# Patient Record
Sex: Male | Born: 1940
Health system: Southern US, Community
[De-identification: ages and names within clinical notes are randomized; demographics above are authoritative.]

## PROBLEM LIST (undated history)

## (undated) DIAGNOSIS — F329 Major depressive disorder, single episode, unspecified: Secondary | ICD-10-CM

## (undated) DIAGNOSIS — E785 Hyperlipidemia, unspecified: Secondary | ICD-10-CM

## (undated) DIAGNOSIS — N189 Chronic kidney disease, unspecified: Secondary | ICD-10-CM

## (undated) DIAGNOSIS — N400 Enlarged prostate without lower urinary tract symptoms: Secondary | ICD-10-CM

## (undated) DIAGNOSIS — C439 Malignant melanoma of skin, unspecified: Secondary | ICD-10-CM

## (undated) DIAGNOSIS — F419 Anxiety disorder, unspecified: Secondary | ICD-10-CM

## (undated) DIAGNOSIS — G20 Parkinson's disease: Secondary | ICD-10-CM

## (undated) DIAGNOSIS — M1990 Unspecified osteoarthritis, unspecified site: Secondary | ICD-10-CM

## (undated) DIAGNOSIS — K5904 Chronic idiopathic constipation: Secondary | ICD-10-CM

## (undated) DIAGNOSIS — I10 Essential (primary) hypertension: Secondary | ICD-10-CM

## (undated) DIAGNOSIS — G20C Parkinsonism, unspecified: Secondary | ICD-10-CM

## (undated) DIAGNOSIS — K8020 Calculus of gallbladder without cholecystitis without obstruction: Secondary | ICD-10-CM

## (undated) DIAGNOSIS — K5790 Diverticulosis of intestine, part unspecified, without perforation or abscess without bleeding: Secondary | ICD-10-CM

## (undated) DIAGNOSIS — Q613 Polycystic kidney, unspecified: Secondary | ICD-10-CM

## (undated) DIAGNOSIS — Z8489 Family history of other specified conditions: Secondary | ICD-10-CM

## (undated) DIAGNOSIS — Z9889 Other specified postprocedural states: Secondary | ICD-10-CM

## (undated) DIAGNOSIS — F32A Depression, unspecified: Secondary | ICD-10-CM

## (undated) DIAGNOSIS — T7840XA Allergy, unspecified, initial encounter: Secondary | ICD-10-CM

## (undated) DIAGNOSIS — K219 Gastro-esophageal reflux disease without esophagitis: Secondary | ICD-10-CM

## (undated) DIAGNOSIS — R112 Nausea with vomiting, unspecified: Secondary | ICD-10-CM

## (undated) DIAGNOSIS — Z9289 Personal history of other medical treatment: Secondary | ICD-10-CM

## (undated) HISTORY — PX: KNEE SURGERY: SHX244

## (undated) HISTORY — PX: HERNIA REPAIR: SHX51

## (undated) HISTORY — DX: Chronic idiopathic constipation: K59.04

## (undated) HISTORY — DX: Gastro-esophageal reflux disease without esophagitis: K21.9

## (undated) HISTORY — DX: Unspecified osteoarthritis, unspecified site: M19.90

## (undated) HISTORY — DX: Hyperlipidemia, unspecified: E78.5

## (undated) HISTORY — DX: Parkinson's disease: G20

## (undated) HISTORY — DX: Depression, unspecified: F32.A

## (undated) HISTORY — DX: Essential (primary) hypertension: I10

## (undated) HISTORY — DX: Calculus of gallbladder without cholecystitis without obstruction: K80.20

## (undated) HISTORY — PX: TONSILLECTOMY AND ADENOIDECTOMY: SUR1326

## (undated) HISTORY — DX: Malignant melanoma of skin, unspecified: C43.9

## (undated) HISTORY — DX: Diverticulosis of intestine, part unspecified, without perforation or abscess without bleeding: K57.90

## (undated) HISTORY — DX: Personal history of other medical treatment: Z92.89

## (undated) HISTORY — DX: Chronic kidney disease, unspecified: N18.9

## (undated) HISTORY — DX: Benign prostatic hyperplasia without lower urinary tract symptoms: N40.0

## (undated) HISTORY — DX: Polycystic kidney, unspecified: Q61.3

## (undated) HISTORY — DX: Major depressive disorder, single episode, unspecified: F32.9

## (undated) HISTORY — DX: Parkinsonism, unspecified: G20.C

## (undated) HISTORY — DX: Allergy, unspecified, initial encounter: T78.40XA

## (undated) HISTORY — DX: Anxiety disorder, unspecified: F41.9

---

## 1989-07-19 DIAGNOSIS — Z8719 Personal history of other diseases of the digestive system: Secondary | ICD-10-CM | POA: Insufficient documentation

## 1997-12-20 ENCOUNTER — Ambulatory Visit (HOSPITAL_COMMUNITY): Admission: RE | Admit: 1997-12-20 | Discharge: 1997-12-20 | Payer: Self-pay | Admitting: Internal Medicine

## 1997-12-20 ENCOUNTER — Encounter: Payer: Self-pay | Admitting: Internal Medicine

## 1997-12-21 ENCOUNTER — Ambulatory Visit (HOSPITAL_COMMUNITY): Admission: RE | Admit: 1997-12-21 | Discharge: 1997-12-21 | Payer: Self-pay | Admitting: Internal Medicine

## 1997-12-21 ENCOUNTER — Encounter: Payer: Self-pay | Admitting: Internal Medicine

## 2000-03-04 ENCOUNTER — Encounter: Payer: Self-pay | Admitting: Internal Medicine

## 2000-09-22 ENCOUNTER — Encounter: Payer: Self-pay | Admitting: Internal Medicine

## 2000-09-22 ENCOUNTER — Encounter: Payer: Self-pay | Admitting: Emergency Medicine

## 2000-09-22 ENCOUNTER — Emergency Department (HOSPITAL_COMMUNITY): Admission: EM | Admit: 2000-09-22 | Discharge: 2000-09-23 | Payer: Self-pay | Admitting: Emergency Medicine

## 2000-09-23 ENCOUNTER — Encounter: Payer: Self-pay | Admitting: Emergency Medicine

## 2000-09-23 ENCOUNTER — Encounter: Payer: Self-pay | Admitting: Internal Medicine

## 2000-09-26 ENCOUNTER — Ambulatory Visit (HOSPITAL_COMMUNITY): Admission: RE | Admit: 2000-09-26 | Discharge: 2000-09-26 | Payer: Self-pay | Admitting: Internal Medicine

## 2000-09-26 ENCOUNTER — Encounter: Payer: Self-pay | Admitting: Internal Medicine

## 2000-10-04 ENCOUNTER — Encounter: Payer: Self-pay | Admitting: Internal Medicine

## 2000-10-04 ENCOUNTER — Ambulatory Visit (HOSPITAL_COMMUNITY): Admission: RE | Admit: 2000-10-04 | Discharge: 2000-10-04 | Payer: Self-pay | Admitting: Internal Medicine

## 2001-07-15 ENCOUNTER — Encounter: Payer: Self-pay | Admitting: Gastroenterology

## 2001-07-15 DIAGNOSIS — K5730 Diverticulosis of large intestine without perforation or abscess without bleeding: Secondary | ICD-10-CM | POA: Insufficient documentation

## 2002-11-13 ENCOUNTER — Ambulatory Visit (HOSPITAL_COMMUNITY): Admission: RE | Admit: 2002-11-13 | Discharge: 2002-11-13 | Payer: Self-pay | Admitting: Nephrology

## 2002-11-13 ENCOUNTER — Encounter: Payer: Self-pay | Admitting: Nephrology

## 2003-08-25 ENCOUNTER — Encounter: Admission: RE | Admit: 2003-08-25 | Discharge: 2003-08-25 | Payer: Self-pay | Admitting: Internal Medicine

## 2004-01-06 ENCOUNTER — Ambulatory Visit: Payer: Self-pay | Admitting: Internal Medicine

## 2004-08-02 ENCOUNTER — Ambulatory Visit: Payer: Self-pay | Admitting: Internal Medicine

## 2004-09-11 ENCOUNTER — Ambulatory Visit: Payer: Self-pay | Admitting: Internal Medicine

## 2004-10-26 ENCOUNTER — Encounter: Admission: RE | Admit: 2004-10-26 | Discharge: 2004-10-26 | Payer: Self-pay | Admitting: Nephrology

## 2004-11-30 ENCOUNTER — Ambulatory Visit: Payer: Self-pay | Admitting: Internal Medicine

## 2004-12-08 ENCOUNTER — Ambulatory Visit: Payer: Self-pay | Admitting: Internal Medicine

## 2004-12-21 ENCOUNTER — Emergency Department (HOSPITAL_COMMUNITY): Admission: EM | Admit: 2004-12-21 | Discharge: 2004-12-21 | Payer: Self-pay | Admitting: Family Medicine

## 2005-01-12 ENCOUNTER — Ambulatory Visit (HOSPITAL_COMMUNITY): Admission: RE | Admit: 2005-01-12 | Discharge: 2005-01-12 | Payer: Self-pay | Admitting: General Surgery

## 2005-12-03 ENCOUNTER — Ambulatory Visit: Payer: Self-pay | Admitting: Internal Medicine

## 2006-01-18 ENCOUNTER — Ambulatory Visit: Payer: Self-pay | Admitting: Internal Medicine

## 2006-01-18 LAB — CONVERTED CEMR LAB
AST: 25 units/L (ref 0–37)
Albumin: 3.4 g/dL — ABNORMAL LOW (ref 3.5–5.2)
Amylase: 94 units/L (ref 27–131)
Basophils Relative: 0.4 % (ref 0.0–1.0)
Bilirubin, Direct: 0.1 mg/dL (ref 0.0–0.3)
Eosinophil percent: 2.8 % (ref 0.0–5.0)
HCT: 39.1 % (ref 39.0–52.0)
Hemoglobin: 13.7 g/dL (ref 13.0–17.0)
Lipase: 32 units/L (ref 11.0–59.0)
MCHC: 35.1 g/dL (ref 30.0–36.0)
Monocytes Absolute: 0.7 10*3/uL (ref 0.2–0.7)
Neutrophils Relative %: 49 % (ref 43.0–77.0)
RBC: 4.25 M/uL (ref 4.22–5.81)
RDW: 12.1 % (ref 11.5–14.6)
Total Bilirubin: 1.1 mg/dL (ref 0.3–1.2)
Total Protein: 6.8 g/dL (ref 6.0–8.3)
WBC: 5.6 10*3/uL (ref 4.5–10.5)

## 2006-01-30 ENCOUNTER — Ambulatory Visit: Payer: Self-pay | Admitting: Internal Medicine

## 2006-03-06 ENCOUNTER — Ambulatory Visit: Payer: Self-pay | Admitting: Internal Medicine

## 2006-05-30 DIAGNOSIS — E785 Hyperlipidemia, unspecified: Secondary | ICD-10-CM | POA: Insufficient documentation

## 2006-05-30 DIAGNOSIS — A048 Other specified bacterial intestinal infections: Secondary | ICD-10-CM | POA: Insufficient documentation

## 2006-05-30 DIAGNOSIS — Z9089 Acquired absence of other organs: Secondary | ICD-10-CM | POA: Insufficient documentation

## 2006-05-30 DIAGNOSIS — K2980 Duodenitis without bleeding: Secondary | ICD-10-CM | POA: Insufficient documentation

## 2006-05-30 DIAGNOSIS — Q613 Polycystic kidney, unspecified: Secondary | ICD-10-CM | POA: Insufficient documentation

## 2006-05-30 DIAGNOSIS — N400 Enlarged prostate without lower urinary tract symptoms: Secondary | ICD-10-CM | POA: Insufficient documentation

## 2006-05-30 DIAGNOSIS — Z9889 Other specified postprocedural states: Secondary | ICD-10-CM | POA: Insufficient documentation

## 2006-05-30 DIAGNOSIS — N009 Acute nephritic syndrome with unspecified morphologic changes: Secondary | ICD-10-CM | POA: Insufficient documentation

## 2006-05-30 DIAGNOSIS — D539 Nutritional anemia, unspecified: Secondary | ICD-10-CM | POA: Insufficient documentation

## 2006-06-03 ENCOUNTER — Ambulatory Visit: Payer: Self-pay | Admitting: Internal Medicine

## 2006-06-27 ENCOUNTER — Ambulatory Visit: Payer: Self-pay | Admitting: Gastroenterology

## 2006-06-27 LAB — CONVERTED CEMR LAB
ALT: 17 U/L
AST: 22 U/L
Albumin: 3.7 g/dL
Alkaline Phosphatase: 71 U/L
BUN: 21 mg/dL
Basophils Absolute: 0.2 K/uL — ABNORMAL HIGH
Basophils Relative: 4 % — ABNORMAL HIGH
Bilirubin, Direct: 0.1 mg/dL
CO2: 29 meq/L
Calcium: 9.1 mg/dL
Chloride: 107 meq/L
Creatinine, Ser: 1.1 mg/dL
Eosinophils Absolute: 0.2 K/uL
Eosinophils Relative: 2.9 %
GFR calc Af Amer: 86 mL/min
GFR calc non Af Amer: 71 mL/min
Glucose, Bld: 91 mg/dL
HCT: 39.2 %
Hemoglobin: 13.6 g/dL
Lymphocytes Relative: 32.5 %
MCHC: 34.6 g/dL
MCV: 91 fL
Monocytes Absolute: 0.5 K/uL
Monocytes Relative: 8.9 %
Neutro Abs: 3.1 K/uL
Neutrophils Relative %: 51.7 %
Platelets: 193 K/uL
Potassium: 4 meq/L
RBC: 4.31 M/uL
RDW: 13.2 %
Sodium: 141 meq/L
TSH: 2.37 u[IU]/mL
Total Bilirubin: 0.9 mg/dL
Total Protein: 7 g/dL
WBC: 5.9 10*3/microliter

## 2006-07-01 ENCOUNTER — Ambulatory Visit (HOSPITAL_COMMUNITY): Admission: RE | Admit: 2006-07-01 | Discharge: 2006-07-01 | Payer: Self-pay | Admitting: Gastroenterology

## 2006-07-01 DIAGNOSIS — Z87442 Personal history of urinary calculi: Secondary | ICD-10-CM | POA: Insufficient documentation

## 2006-07-19 ENCOUNTER — Ambulatory Visit: Payer: Self-pay | Admitting: Gastroenterology

## 2006-07-19 ENCOUNTER — Encounter: Payer: Self-pay | Admitting: Internal Medicine

## 2006-08-05 ENCOUNTER — Ambulatory Visit: Payer: Self-pay | Admitting: Internal Medicine

## 2006-08-05 DIAGNOSIS — K219 Gastro-esophageal reflux disease without esophagitis: Secondary | ICD-10-CM | POA: Insufficient documentation

## 2006-08-05 DIAGNOSIS — R42 Dizziness and giddiness: Secondary | ICD-10-CM | POA: Insufficient documentation

## 2006-08-20 ENCOUNTER — Telehealth (INDEPENDENT_AMBULATORY_CARE_PROVIDER_SITE_OTHER): Payer: Self-pay | Admitting: *Deleted

## 2006-08-26 ENCOUNTER — Telehealth: Payer: Self-pay | Admitting: Internal Medicine

## 2006-09-02 ENCOUNTER — Ambulatory Visit: Payer: Self-pay | Admitting: Gastroenterology

## 2006-10-29 ENCOUNTER — Encounter: Payer: Self-pay | Admitting: Internal Medicine

## 2006-12-02 ENCOUNTER — Telehealth (INDEPENDENT_AMBULATORY_CARE_PROVIDER_SITE_OTHER): Payer: Self-pay | Admitting: *Deleted

## 2007-01-10 ENCOUNTER — Telehealth (INDEPENDENT_AMBULATORY_CARE_PROVIDER_SITE_OTHER): Payer: Self-pay | Admitting: *Deleted

## 2007-04-26 DIAGNOSIS — D179 Benign lipomatous neoplasm, unspecified: Secondary | ICD-10-CM | POA: Insufficient documentation

## 2007-04-26 DIAGNOSIS — J309 Allergic rhinitis, unspecified: Secondary | ICD-10-CM | POA: Insufficient documentation

## 2007-04-26 DIAGNOSIS — I10 Essential (primary) hypertension: Secondary | ICD-10-CM | POA: Insufficient documentation

## 2007-04-26 DIAGNOSIS — M1990 Unspecified osteoarthritis, unspecified site: Secondary | ICD-10-CM | POA: Insufficient documentation

## 2007-06-11 ENCOUNTER — Encounter: Payer: Self-pay | Admitting: Internal Medicine

## 2007-08-11 ENCOUNTER — Ambulatory Visit: Payer: Self-pay | Admitting: Internal Medicine

## 2007-08-11 ENCOUNTER — Encounter (INDEPENDENT_AMBULATORY_CARE_PROVIDER_SITE_OTHER): Payer: Self-pay | Admitting: *Deleted

## 2007-08-11 DIAGNOSIS — K921 Melena: Secondary | ICD-10-CM | POA: Insufficient documentation

## 2007-08-11 DIAGNOSIS — N401 Enlarged prostate with lower urinary tract symptoms: Secondary | ICD-10-CM

## 2007-08-11 DIAGNOSIS — N138 Other obstructive and reflux uropathy: Secondary | ICD-10-CM | POA: Insufficient documentation

## 2007-08-11 DIAGNOSIS — R351 Nocturia: Secondary | ICD-10-CM | POA: Insufficient documentation

## 2007-08-13 ENCOUNTER — Encounter: Payer: Self-pay | Admitting: Internal Medicine

## 2007-08-15 ENCOUNTER — Telehealth (INDEPENDENT_AMBULATORY_CARE_PROVIDER_SITE_OTHER): Payer: Self-pay | Admitting: *Deleted

## 2007-08-18 ENCOUNTER — Encounter (INDEPENDENT_AMBULATORY_CARE_PROVIDER_SITE_OTHER): Payer: Self-pay | Admitting: *Deleted

## 2007-08-18 LAB — CONVERTED CEMR LAB: PSA: 1.13 ng/mL (ref 0.10–4.00)

## 2007-09-02 ENCOUNTER — Encounter: Payer: Self-pay | Admitting: Internal Medicine

## 2007-09-03 ENCOUNTER — Telehealth (INDEPENDENT_AMBULATORY_CARE_PROVIDER_SITE_OTHER): Payer: Self-pay | Admitting: *Deleted

## 2007-09-10 ENCOUNTER — Ambulatory Visit: Payer: Self-pay | Admitting: Gastroenterology

## 2007-09-23 ENCOUNTER — Encounter: Payer: Self-pay | Admitting: Internal Medicine

## 2007-10-06 ENCOUNTER — Ambulatory Visit: Payer: Self-pay | Admitting: Gastroenterology

## 2007-10-22 ENCOUNTER — Ambulatory Visit: Payer: Self-pay | Admitting: Gastroenterology

## 2007-10-22 LAB — CONVERTED CEMR LAB
OCCULT 1: NEGATIVE
OCCULT 3: NEGATIVE
OCCULT 4: NEGATIVE
OCCULT 5: NEGATIVE

## 2007-10-24 ENCOUNTER — Encounter: Payer: Self-pay | Admitting: Gastroenterology

## 2007-10-28 ENCOUNTER — Encounter: Payer: Self-pay | Admitting: Internal Medicine

## 2007-12-08 ENCOUNTER — Encounter: Payer: Self-pay | Admitting: Gastroenterology

## 2008-01-14 ENCOUNTER — Telehealth (INDEPENDENT_AMBULATORY_CARE_PROVIDER_SITE_OTHER): Payer: Self-pay | Admitting: *Deleted

## 2008-01-28 ENCOUNTER — Encounter: Payer: Self-pay | Admitting: Internal Medicine

## 2008-02-10 ENCOUNTER — Ambulatory Visit: Payer: Self-pay | Admitting: Internal Medicine

## 2008-02-10 DIAGNOSIS — R29898 Other symptoms and signs involving the musculoskeletal system: Secondary | ICD-10-CM | POA: Insufficient documentation

## 2008-02-10 DIAGNOSIS — D239 Other benign neoplasm of skin, unspecified: Secondary | ICD-10-CM | POA: Insufficient documentation

## 2008-02-10 DIAGNOSIS — K056 Periodontal disease, unspecified: Secondary | ICD-10-CM | POA: Insufficient documentation

## 2008-02-10 DIAGNOSIS — D492 Neoplasm of unspecified behavior of bone, soft tissue, and skin: Secondary | ICD-10-CM | POA: Insufficient documentation

## 2008-02-10 DIAGNOSIS — K069 Disorder of gingiva and edentulous alveolar ridge, unspecified: Secondary | ICD-10-CM

## 2008-02-17 ENCOUNTER — Encounter: Payer: Self-pay | Admitting: Internal Medicine

## 2008-03-05 ENCOUNTER — Encounter: Payer: Self-pay | Admitting: Internal Medicine

## 2008-03-12 ENCOUNTER — Encounter: Payer: Self-pay | Admitting: Internal Medicine

## 2008-04-05 ENCOUNTER — Encounter: Payer: Self-pay | Admitting: Internal Medicine

## 2008-04-12 ENCOUNTER — Telehealth (INDEPENDENT_AMBULATORY_CARE_PROVIDER_SITE_OTHER): Payer: Self-pay | Admitting: *Deleted

## 2008-06-17 ENCOUNTER — Encounter: Payer: Self-pay | Admitting: Internal Medicine

## 2008-07-07 ENCOUNTER — Telehealth: Payer: Self-pay | Admitting: Gastroenterology

## 2008-07-19 ENCOUNTER — Telehealth (INDEPENDENT_AMBULATORY_CARE_PROVIDER_SITE_OTHER): Payer: Self-pay | Admitting: *Deleted

## 2008-08-06 ENCOUNTER — Ambulatory Visit: Payer: Self-pay | Admitting: Internal Medicine

## 2008-08-06 ENCOUNTER — Telehealth (INDEPENDENT_AMBULATORY_CARE_PROVIDER_SITE_OTHER): Payer: Self-pay | Admitting: *Deleted

## 2008-08-06 DIAGNOSIS — R609 Edema, unspecified: Secondary | ICD-10-CM | POA: Insufficient documentation

## 2008-08-08 LAB — CONVERTED CEMR LAB
Basophils Absolute: 0 10*3/uL (ref 0.0–0.1)
Basophils Relative: 1 % (ref 0–1)
Eosinophils Absolute: 0.5 10*3/uL (ref 0.0–0.7)
MCHC: 32.2 g/dL (ref 30.0–36.0)
MCV: 93.1 fL (ref 78.0–100.0)
Neutro Abs: 2.8 10*3/uL (ref 1.7–7.7)
Neutrophils Relative %: 44 % (ref 43–77)
Platelets: 199 10*3/uL (ref 150–400)

## 2008-08-09 ENCOUNTER — Encounter: Payer: Self-pay | Admitting: Internal Medicine

## 2008-08-10 ENCOUNTER — Encounter (INDEPENDENT_AMBULATORY_CARE_PROVIDER_SITE_OTHER): Payer: Self-pay | Admitting: *Deleted

## 2008-08-13 ENCOUNTER — Encounter: Payer: Self-pay | Admitting: Internal Medicine

## 2008-08-13 ENCOUNTER — Telehealth (INDEPENDENT_AMBULATORY_CARE_PROVIDER_SITE_OTHER): Payer: Self-pay | Admitting: *Deleted

## 2008-08-26 ENCOUNTER — Telehealth (INDEPENDENT_AMBULATORY_CARE_PROVIDER_SITE_OTHER): Payer: Self-pay | Admitting: *Deleted

## 2008-10-01 ENCOUNTER — Encounter: Payer: Self-pay | Admitting: Internal Medicine

## 2009-01-22 HISTORY — PX: COLONOSCOPY: SHX174

## 2009-02-15 ENCOUNTER — Telehealth: Payer: Self-pay | Admitting: Gastroenterology

## 2009-03-21 ENCOUNTER — Telehealth (INDEPENDENT_AMBULATORY_CARE_PROVIDER_SITE_OTHER): Payer: Self-pay | Admitting: *Deleted

## 2009-06-21 ENCOUNTER — Encounter: Payer: Self-pay | Admitting: Internal Medicine

## 2009-07-04 ENCOUNTER — Encounter: Payer: Self-pay | Admitting: Internal Medicine

## 2009-07-08 ENCOUNTER — Ambulatory Visit (HOSPITAL_COMMUNITY): Admission: RE | Admit: 2009-07-08 | Discharge: 2009-07-08 | Payer: Self-pay | Admitting: Nephrology

## 2009-10-04 ENCOUNTER — Telehealth: Payer: Self-pay | Admitting: Gastroenterology

## 2009-12-05 ENCOUNTER — Ambulatory Visit: Payer: Self-pay | Admitting: Internal Medicine

## 2009-12-05 DIAGNOSIS — M79609 Pain in unspecified limb: Secondary | ICD-10-CM | POA: Insufficient documentation

## 2009-12-05 DIAGNOSIS — R361 Hematospermia: Secondary | ICD-10-CM | POA: Insufficient documentation

## 2009-12-12 ENCOUNTER — Encounter: Payer: Self-pay | Admitting: Internal Medicine

## 2009-12-14 ENCOUNTER — Encounter: Payer: Self-pay | Admitting: Internal Medicine

## 2009-12-14 ENCOUNTER — Ambulatory Visit: Payer: Self-pay | Admitting: Internal Medicine

## 2009-12-17 ENCOUNTER — Encounter: Payer: Self-pay | Admitting: Internal Medicine

## 2009-12-26 ENCOUNTER — Emergency Department (HOSPITAL_COMMUNITY)
Admission: EM | Admit: 2009-12-26 | Discharge: 2009-12-27 | Payer: Self-pay | Source: Home / Self Care | Admitting: Emergency Medicine

## 2009-12-26 ENCOUNTER — Encounter: Payer: Self-pay | Admitting: Internal Medicine

## 2009-12-28 ENCOUNTER — Ambulatory Visit: Payer: Self-pay | Admitting: Internal Medicine

## 2009-12-28 DIAGNOSIS — R079 Chest pain, unspecified: Secondary | ICD-10-CM | POA: Insufficient documentation

## 2009-12-28 DIAGNOSIS — R9431 Abnormal electrocardiogram [ECG] [EKG]: Secondary | ICD-10-CM | POA: Insufficient documentation

## 2009-12-29 ENCOUNTER — Ambulatory Visit: Payer: Self-pay | Admitting: Internal Medicine

## 2010-01-02 LAB — CONVERTED CEMR LAB
ALT: 17 units/L (ref 0–53)
BUN: 24 mg/dL — ABNORMAL HIGH (ref 6–23)
Basophils Absolute: 0 10*3/uL (ref 0.0–0.1)
Calcium: 9 mg/dL (ref 8.4–10.5)
Cholesterol: 207 mg/dL — ABNORMAL HIGH (ref 0–200)
Creatinine, Ser: 1.5 mg/dL (ref 0.4–1.5)
Eosinophils Absolute: 0.4 10*3/uL (ref 0.0–0.7)
GFR calc non Af Amer: 48.54 mL/min — ABNORMAL LOW (ref >60.00)
HDL: 66.1 mg/dL (ref >39.00)
Hemoglobin: 13.4 g/dL (ref 13.0–17.0)
Lymphocytes Relative: 33.2 % (ref 12.0–46.0)
MCHC: 33.9 g/dL (ref 30.0–36.0)
Neutro Abs: 2.2 10*3/uL (ref 1.4–7.7)
RDW: 13.3 % (ref 11.5–14.6)
Total Bilirubin: 1 mg/dL (ref 0.3–1.2)
Total CHOL/HDL Ratio: 3
Triglycerides: 51 mg/dL (ref 0.0–149.0)
VLDL: 10.2 mg/dL (ref 0.0–40.0)

## 2010-01-03 ENCOUNTER — Telehealth: Payer: Self-pay | Admitting: Gastroenterology

## 2010-01-19 ENCOUNTER — Ambulatory Visit: Payer: Self-pay | Admitting: Internal Medicine

## 2010-01-19 ENCOUNTER — Encounter: Payer: Self-pay | Admitting: Internal Medicine

## 2010-02-19 LAB — CONVERTED CEMR LAB
ALT: 21 units/L (ref 0–40)
AST: 26 units/L (ref 0–37)
Albumin: 3.9 g/dL (ref 3.5–5.2)
Basophils Absolute: 0 10*3/uL (ref 0.0–0.1)
Chloride: 106 meq/L (ref 96–112)
Creatinine, Ser: 1.3 mg/dL (ref 0.4–1.5)
Glomerular Filtration Rate, Af Am: 71 mL/min/{1.73_m2}
Glucose, Bld: 105 mg/dL — ABNORMAL HIGH (ref 70–99)
Iron: 117 ug/dL (ref 42–165)
Lymphocytes Relative: 36.6 % (ref 12.0–46.0)
MCHC: 34 g/dL (ref 30.0–36.0)
MCV: 91.2 fL (ref 78.0–100.0)
Neutro Abs: 2.2 10*3/uL (ref 1.4–7.7)
Neutrophils Relative %: 46.1 % (ref 43.0–77.0)
Phosphorus Concentration: 3.7 mg/dL (ref 2.3–4.6)
Platelets: 205 10*3/uL (ref 150–400)
RBC: 4.55 M/uL (ref 4.22–5.81)
Sodium: 141 meq/L (ref 135–145)
Total Bilirubin: 1 mg/dL (ref 0.3–1.2)

## 2010-02-21 NOTE — Progress Notes (Signed)
Summary: Protonix refill  Medications Added PROTONIX 40 MG  TBEC (PANTOPRAZOLE SODIUM) 1 by mouth qd       Phone Note Refill Request Message from:  Fax from Pharmacy on October 04, 2009 10:53 AM  Refills Requested: Medication #1:  PROTONIX 40 MG  TBEC 1 by mouth qd   Dosage confirmed as above?Dosage Confirmed   Brand Name Necessary? No   Supply Requested: 6 months  Method Requested: Electronic Initial call taken by: Merri Ray CMA Duncan Dull),  October 04, 2009 10:53 AM    New/Updated Medications: PROTONIX 40 MG  TBEC (PANTOPRAZOLE SODIUM) 1 by mouth qd Prescriptions: PROTONIX 40 MG  TBEC (PANTOPRAZOLE SODIUM) 1 by mouth qd  #30 x 3   Entered by:   Merri Ray CMA (AAMA)   Authorized by:   Louis Meckel MD   Signed by:   Merri Ray CMA (AAMA) on 10/04/2009   Method used:   Electronically to        [facility]* (retail)       [address]       [place], [place]  [address]       Ph: [phone]       Fax: [phone]   RxID:   [id number]   Appended Document: Protonix refill Pt  needs a follow office appointment, to recieve any further refills

## 2010-02-21 NOTE — Progress Notes (Signed)
Summary: Protonix refill   Phone Note Refill Request Message from:  Fax from Pharmacy on February 15, 2009 8:32 AM  Refills Requested: Medication #1:  PROTONIX 40 MG  TBEC 1 by mouth qd   Dosage confirmed as above?Dosage Confirmed   Brand Name Necessary? No   Supply Requested: 6 months  Method Requested: Electronic Initial call taken by: Merri Ray CMA Duncan Dull),  February 15, 2009 8:32 AM    Prescriptions: PROTONIX 40 MG  TBEC (PANTOPRAZOLE SODIUM) 1 by mouth qd  #30 x 6   Entered by:   Merri Ray CMA (AAMA)   Authorized by:   Louis Meckel MD   Signed by:   Merri Ray CMA (AAMA) on 02/15/2009   Method used:   Electronically to        [facility]* (retail)       [address]       [place], [place]  [id number]       Ph: [phone]       Fax: [phone]   RxID:   [id number]

## 2010-02-21 NOTE — Letter (Signed)
Summary: [facility]  [facility]   Imported By: Lanelle Bal 06/30/2009 13:25:12  _____________________________________________________________________  External Attachment:    Type:   Image     Comment:   External Document

## 2010-02-21 NOTE — Letter (Signed)
Summary: [facility]  [facility]   Imported By: Lennie Odor 07/19/2009 14:12:37  _____________________________________________________________________  External Attachment:    Type:   Image     Comment:   External Document

## 2010-02-21 NOTE — Assessment & Plan Note (Signed)
Summary: HOSPTIAL FOLLOWUP/OK PER KELLY//KN   Vital Signs:  Patient profile:   70 year old male Weight:      176.6 pounds BMI:     24.55 Temp:     97.4 degrees F oral Pulse rate:   64 / minute Resp:     15 per minute BP sitting:   114 / 68  (left arm) Cuff size:   large  Vitals Entered By: Shonna Chock CMA (December 28, 2009 2:54 PM) CC: Hospital Follow-up: concerns not resolved   Primary Care Provider:  Marga Melnick, M.D.  CC:  Hospital Follow-up: concerns not resolved.  History of Present Illness:      This is a 70 year old male who was seen in [facility] ER 12/5 with Chest pain.  The patient described resting chest pain, w/o  nausea, vomiting, diaphoresis, shortness of breath, palpitations, light headedness, or  indigestion.  The pain was  constant  and sharp for 45 min.  The pain is located in the left anterior chest and the pain does not radiate.  The pain  had no triggers or relievers.EKG was similar to that @ [facility]. No recurrence. His brother had CBAG @ 71.  Current Medications (verified): 1)  Astelin 137 Mcg/spray Soln (Azelastine Hcl) .... 2spray Each Nostril Two Times A Day 2)  Fexofenadine Hcl 180 Mg Tabs (Fexofenadine Hcl) .[name].. 1 By Mouth Qd 3)  Protonix 40 Mg  Tbec (Pantoprazole Sodium) .[name].. 1 By Mouth Qd 4)  Bayer Low Strength 81 Mg Tbec (Aspirin) .[name].. 1 By Mouth Once Daily 5)  Amlodipine Besylate 5 Mg Tabs (Amlodipine Besylate) .[name].. 1 By Mouth At Bedtime 6)  Hydrochlorothiazide 12.5 Mg Caps (Hydrochlorothiazide) .[name].. 1 By Mouth Once Daily 7)  Allergy Injections .... 2 Injections Once Weekly 8)  Nasonex 50 Mcg/act Susp (Mometasone Furoate) .[name].. 1-2 Sprays Daily As Needed 9)  Azelastine Hcl 0.05 % Soln (Azelastine Hcl) .... As Needed For Allergies 10)  Singulair 10 Mg Tabs (Montelukast Sodium) .... As Needed 11)  Meclizine Hcl 12.5 Mg Tabs (Meclizine Hcl) .[name].. 12.5mg -25mg  As Needed For Vertigo 12)  Gabapentin 100 Mg Caps (Gabapentin) .[name].. 1 Every 8 Hrs As Needed For Thigh  Pain 13)  Micardis 40 Mg Tabs (Telmisartan) .[name].. 1 By Mouth Once Daily  Allergies: 1)  ! Adhesive Tape 2)  Lodine 3)  Valium 4)  * Iodine and Mecurichrome  Physical Exam  General:  well-nourished,in no acute distress; alert,appropriate and cooperative throughout examination Lungs:  Normal respiratory effort, chest expands symmetrically. Lungs are clear to auscultation, no crackles or wheezes. Heart:  Normal rate and regular rhythm. S1 and S2 normal without gallop, murmur, click, rub.S4 with slurring  Pulses:  R and L carotid,radial,dorsalis pedis and posterior tibial pulses are full and equal bilaterally   Impression & Recommendations:  Problem # 1:  CHEST PAIN (ICD-786.50) negative cardiac evaluation [place] 12/26/2009 Orders: Cardiology Referral (Cardiology)  Problem # 2:  NONSPECIFIC ABNORMAL ELECTROCARDIOGRAM (ICD-794.31)  loss of R voltage lateral V leads w/o ischemic T changes  Orders: Cardiology Referral (Cardiology)  Complete Medication List: 1)  Astelin 137 Mcg/spray Soln (Azelastine hcl) .... 2spray each nostril two times a day 2)  Fexofenadine Hcl 180 Mg Tabs (Fexofenadine hcl) .[name].. 1 by mouth qd 3)  Protonix 40 Mg Tbec (Pantoprazole sodium) .[name].. 1 by mouth qd 4)  Bayer Low Strength 81 Mg Tbec (Aspirin) .[name].. 1 by mouth once daily 5)  Amlodipine Besylate 5 Mg Tabs (Amlodipine besylate) .[name].. 1 by  mouth at bedtime 6)  Hydrochlorothiazide 12.5 Mg Caps (Hydrochlorothiazide) .[name].. 1 by mouth once daily 7)  Allergy Injections  .... 2 injections once weekly 8)  Nasonex 50 Mcg/act Susp (Mometasone furoate) .[name].. 1-2 sprays daily as needed 9)  Azelastine Hcl 0.05 % Soln (Azelastine hcl) .... As needed for allergies 10)  Singulair 10 Mg Tabs (Montelukast sodium) .... As needed 11)  Meclizine Hcl 12.5 Mg Tabs (Meclizine hcl) .[name].. 12.5mg -25mg  as needed for vertigo 12)  Gabapentin 100 Mg Caps (Gabapentin) .[name].. 1 every 8 hrs as needed for thigh pain 13)  Micardis 40 Mg Tabs  (Telmisartan) .[name].. 1 by mouth once daily  Patient Instructions: 1)   Hold exercise until seen by Cardiology.Please schedule fasting labs: 2)  BMP ; 3)  Hepatic Panel ; 4)  Lipid Panel ; 5)  TSH ; 6)  CBC w/ Diff.   Orders Added: 1)  Est. Patient Level III [14782] 2)  Cardiology Referral [Cardiology]

## 2010-02-21 NOTE — Progress Notes (Signed)
Summary: RX  Phone Note Refill Request   Refills Requested: Medication #1:  ASTELIN 137 MCG/SPRAY SOLN 1 spray each nostril two times a day [place] --[phone]  Initial call taken by: Freddy Jaksch,  March 21, 2009 9:50 AM    Prescriptions: ASTELIN 137 MCG/SPRAY SOLN (AZELASTINE HCL) 1 spray each nostril two times a day  #1 x 3   Entered by:   Kandice Hams   Authorized by:   Marga Melnick MD   Signed by:   Kandice Hams on 03/21/2009   Method used:   Faxed to ...       [facility]* (retail)       [address]       [place], [place]  [address]       Ph: [phone]       Fax: [phone]   RxID:   [id number]

## 2010-02-21 NOTE — Assessment & Plan Note (Signed)
Summary: YEARLY EXAM///SPH   Vital Signs:  Patient profile:   70 year old male Height:      71.25 inches Weight:      176.2 pounds BMI:     24.49 Temp:     98.0 degrees F oral Pulse rate:   72 / minute Resp:     14 per minute BP sitting:   102 / 68  (left arm) Cuff size:   large  Vitals Entered By: Shonna Chock CMA (December 14, 2009 2:35 PM) CC: [facility]    Primary Care Provider:  Marga Melnick, M.D.  CC:  [facility] .  History of Present Illness: Here for Medicare AWV: 1.Risk factors based on Past M, S, F history:GERD;HTN;Allergic Rhinitis ( chart updated) 2.Physical Activities: active w/o definite  exercise program 3.Depression/mood: no issues 4.Hearing:hhe sees Dr Roe Rutherford , Audiologist 5.ADL's: no limitations 6.Fall Risk: none 7.Home Safety: no issues 8.Height, weight, &visual acuity: wall chart read @ 6 ft with contacts 9.Counseling: POA& Living Will in place 10.Labs ordered based on risk factors: see Orders 11. Referral Coordination: none requested 12.  Care Plan: see Instructions 13. Cognitive Assessment: Oriented X 3; memory & recall   intact ;  "WORLD " spelled backwards; mood & affect normal. Hypertension Follow-Up:  The patient reports fatigue, but denies lightheadedness, urinary frequency, headaches, and edema.  The patient denies the following associated symptoms: chest pain, chest pressure, exercise intolerance, dyspnea, palpitations, and syncope.  Compliance with medications (by patient report) has been near 100%.  The patient reports that dietary compliance has been good.  Adjunctive measures currently used by the patient include salt restriction.   BP averages 105/78.See EKG: new loss of lateral V lead QRS voltage of ? significance.  Preventive Screening-Counseling & Management  Alcohol-Tobacco     Alcohol drinks/day: <1     Smoking Status: never  Caffeine-Diet-Exercise     Caffeine use/day: < 1  Hep-HIV-STD-Contraception     Dental Visit-last 6 months no     Dental Care Counseling: seen annually     Sun Exposure-Excessive: no  Safety-Violence-Falls     Seat Belt Use: yes     Firearms in the Home: firearms in the home     Firearm Counseling: not indicated; uses recommended firearm safety measures     Smoke Detectors: yes      Blood Transfusions:  no.        Travel History:  [place] 1996.    Allergies: 1)  ! Adhesive Tape 2)  Lodine 3)  Valium 4)  * Iodine and Mecurichrome  Past History:  Past Medical History: Benign prostatic hypertrophy Anemia-NOS Hyperlipidemia: NMR Lipoprofile 2007: LDL 119(882/77), HDL 65, TG 82. LDL goal < 160 Polycystic kidneys, Dr Geanie Berlin Diverticulosis, colon Bullous Pemphigoid, Dr Karlyn Agee  Past Surgical History: Hernia , umbilical 2006 Tonsillectomy Colonoscopy 2004 (?) neg; 2009 diverticulosis  Family History: Family History of Breast Cancer:sister and daughter No FH of Colon Cancer: Father: DUD Mother: arthritis Siblings: bro: DM, TICs , CABG; sister: AF ,ulcer; sister: breast cancer;MGM : CVA; P uncl : MI in 47s  Social History: Patient has never smoked.  Alcohol Use - yes: minimally Retired but active in Agricultural consultant work Married Caffeine use/day:  < 1 Dental Care w/in 6 mos.:  no Sun Exposure-Excessive:  no Risk analyst Use:  yes Blood Transfusions:  no  Review of Systems  The patient denies anorexia, fever, weight loss, weight gain, hoarseness, prolonged cough, hemoptysis, abdominal pain, melena, hematochezia, severe indigestion/heartburn, hematuria, suspicious skin  lesions, unusual weight change, abnormal bleeding, enlarged lymph nodes, and angioedema.    Physical Exam  General:  Appears younger than age,well-nourished;alert,appropriate and cooperative throughout examination Head:  Normocephalic and atraumatic without obvious abnormalities. No apparent alopecia  Eyes:  No corneal or conjunctival inflammation noted.Perrla. Funduscopic exam benign, without hemorrhages, exudates or  papilledema.  Ears:  External ear exam shows no significant lesions or deformities.  Otoscopic examination reveals clear canals, tympanic membranes are intact bilaterally without bulging, retraction, inflammation or discharge. Hearing is grossly normal bilaterally. Nose:  External nasal examination shows no deformity or inflammation. Nasal mucosa are pink and moist without lesions or exudates. Septum to R Mouth:  Oral mucosa and oropharynx without lesions or exudates.  Teeth in good repair. Neck:  No deformities, masses, or tenderness noted. Chest Wall:   Minor pectus carinatum.   Lungs:  Normal respiratory effort, chest expands symmetrically. Lungs are clear to auscultation, no crackles or wheezes. Heart:  Normal rate and regular rhythm. S1 and S2 normal without gallop, murmur, click, rub . Occassional extra extra  beat . S4 Abdomen:  Bowel sounds positive,abdomen soft and non-tender without masses, organomegaly or hernias noted. Msk:  No deformity or scoliosis noted of thoracic or lumbar spine.   Pulses:  R and L carotid,radial,dorsalis pedis and posterior tibial pulses are full and equal bilaterally Extremities:  No clubbing, cyanosis, edema. Minor OA  DIP changes Neurologic:  alert & oriented X3 and DTRs symmetrical and normal.   Skin:  Intact without suspicious lesions or rashes Cervical Nodes:  No lymphadenopathy noted Axillary Nodes:  No palpable lymphadenopathy Psych:  memory intact for recent and remote, normally interactive, and good eye contact.     Impression & Recommendations:  Problem # 1:  PREVENTIVE HEALTH CARE (ICD-V70.0)  Orders: [facility] -Subsequent Annual Wellness Visit [phone])  Problem # 2:  HYPERTENSION (ICD-401.9)  Goal as per Dr Geanie Berlin = < 120 systolic His updated medication list for this problem includes:    Amlodipine Besylate 5 Mg Tabs (Amlodipine besylate) .[name]... 1 by mouth once daily    Hydrochlorothiazide 12.5 Mg Caps (Hydrochlorothiazide) .[name]... 1 by mouth  once daily  Orders: EKG w/ Interpretation (93000): new loss of QRS voltage in lateral V leads; Cardiology overread will be requested  Problem # 3:  HYPERPLASIA PROSTATE UNS W/UR OBST & OTH LUTS (ICD-600.91) as per Dr Marcello Fennel  Problem # 4:  POLYCYSTIC KIDNEY DISEASE (ICD-753.12) as per Dr  Geanie Berlin  Complete Medication List: 1)  Astelin 137 Mcg/spray Soln (Azelastine hcl) .... 2spray each nostril two times a day 2)  Fexofenadine Hcl 180 Mg Tabs (Fexofenadine hcl) .[name].. 1 by mouth qd 3)  Protonix 40 Mg Tbec (Pantoprazole sodium) .[name].. 1 by mouth qd 4)  Bayer Low Strength 81 Mg Tbec (Aspirin) .[name].. 1 by mouth once daily 5)  Amlodipine Besylate 5 Mg Tabs (Amlodipine besylate) .[name].. 1 by mouth once daily 6)  Hydrochlorothiazide 12.5 Mg Caps (Hydrochlorothiazide) .[name].. 1 by mouth once daily 7)  Allergy Injections  .... 2 injections once weekly 8)  Nasonex 50 Mcg/act Susp (Mometasone furoate) .[name].. 1-2 sprays daily 9)  Azelastine Hcl 0.05 % Soln (Azelastine hcl) .... As needed for allergies 10)  Singulair 10 Mg Tabs (Montelukast sodium) .... As needed 11)  Meclizine Hcl 12.5 Mg Tabs (Meclizine hcl) .[name].. 12.5mg -25mg  as needed for vertigo 12)  Gabapentin 100 Mg Caps (Gabapentin) .[name].. 1 every 8 hrs as needed for thigh pain  Other Orders: Zoster (Shingles) Vaccine Live [phone]) Admin 1st Vaccine (  45409)  Patient Instructions: 1)  It is important that you exercise regularly at least 20 minutes 5 times a week. If you develop chest pain, have severe difficulty breathing, or feel very tired , stop exercising immediately and seek medical attention. Consider stretch aerobics or freestyle swimming for back. Please ask Dr Geanie Berlin to forward labs in 01/2010. 2)  Check your Blood Pressure regularly. If it is above: 120 systolic ON AVERAGE  you should make an appointment. I'll request a Cardiologist overread the EKG to assess the voltage changes in lateral V leads.   Orders Added: 1)  Zoster (Shingles)  Vaccine Live [90736] 2)  Admin 1st Vaccine [90471] 3)  [facility] -Subsequent Annual Wellness Visit [G0439] 4)  Est. Patient Level III [81191] 5)  EKG w/ Interpretation [93000]   Immunizations Administered:  Zostavax # 1:    Vaccine Type: Zostavax    Site: Right Arm    Mfr: Merck    Dose: 0.26mL    Route: [place]    Given by: Shonna Chock CMA    Exp. Date: 11/23/2010    Lot #: [id number]   Immunizations Administered:  Zostavax # 1:    Vaccine Type: Zostavax    Site: Right Arm    Mfr: Merck    Dose: 0.49mL    Route: [place]    Given by: Shonna Chock CMA    Exp. Date: 11/23/2010    Lot #: [id number]

## 2010-02-21 NOTE — Assessment & Plan Note (Signed)
Summary: PAIN IN THE RIGHT THIGH x3-4 WEEKS/RH......   Vital Signs:  Patient profile:   70 year old male Weight:      176.4 pounds BMI:     24.01 Temp:     98.4 degrees F oral Pulse rate:   72 / minute Resp:     14 per minute BP sitting:   112 / 70  (left arm) Cuff size:   large  Vitals Entered By: Shonna Chock CMA (December 05, 2009 2:02 PM)  Primary Care Provider:  Marga Melnick, M.D.  CC:  Lower Extremity Joint pain.  History of Present Illness: Lower Extremity Pain      This is a 70 year old man who presents with Lower Extremity  pain. for > 1 year.Initially this was just above the R knee . Now it radiates up to R groin.  The patient denies swelling, redness, giving away, locking, popping, stiffness for >1 hr, decreased ROM, and weakness of the RLE.   The pain began gradually and with no injury.  The pain is described as burning, intermittent, and activity related, only when standing or a period of time.  To date  no evaluation.  The patient denies the following symptoms: fever, rash, photosensitivity, eye symptoms, diarrhea, and dysuria.  He has had hematospermia X 2 in past 2 weeks. He has PMH of chronic LBP symptoms.  Current Medications (verified): 1)  Astelin 137 Mcg/spray Soln (Azelastine Hcl) .... 2spray Each Nostril Two Times A Day 2)  Fexofenadine Hcl 180 Mg Tabs (Fexofenadine Hcl) .[name].. 1 By Mouth Qd 3)  Protonix 40 Mg  Tbec (Pantoprazole Sodium) .[name].. 1 By Mouth Qd 4)  Bayer Low Strength 81 Mg Tbec (Aspirin) .[name].. 1 By Mouth Once Daily 5)  Amlodipine Besylate 5 Mg Tabs (Amlodipine Besylate) .[name].. 1 By Mouth Once Daily 6)  Hydrochlorothiazide 12.5 Mg Caps (Hydrochlorothiazide) .[name].. 1 By Mouth Once Daily 7)  Allergy Injections .... 2 Injections Once Weekly 8)  Nasonex 50 Mcg/act Susp (Mometasone Furoate) .[name].. 1-2 Sprays Daily 9)  Azelastine Hcl 0.05 % Soln (Azelastine Hcl) .... As Needed For Allergies 10)  Singulair 10 Mg Tabs (Montelukast Sodium) .... As Needed 11)   Meclizine Hcl 12.5 Mg Tabs (Meclizine Hcl) .[name].. 12.5mg -25mg  As Needed For Vertigo  Allergies: 1)  ! Adhesive Tape 2)  Lodine 3)  Valium 4)  * Iodine and Mecurichrome  Physical Exam  General:  Appears youmger than age,well-nourished,in no acute distress; alert,appropriate and cooperative throughout examination Abdomen:  Bowel sounds positive,abdomen soft and non-tender . RLQ mass (? R kidney) & ventral  hernia  noted. Genitalia:  Testes bilaterally descended without nodularity, tenderness or masses. No scrotal masses or lesions. No penis lesions or urethral discharge. L varicocele.   Prostate:  Prostate gland firm and smooth, no enlargement, nodularity, tenderness, mass, asymmetry or induration. Msk:  No deformity or scoliosis noted of thoracic or lumbar spine.  No tenderness to percussion LS spine Pulses:  R and L radial,dorsalis pedis and posterior tibial pulses are full and equal bilaterally Extremities:  No clubbing, cyanosis, edema. OA DIP deformities noted with normal full range of motion of all joints.  Neg SLR Neurologic:  alert & oriented X3, strength normal in all extremities, gait(heel/toe) normal, and DTRs symmetrical and normal.   Skin:  Intact without suspicious lesions or rashes Cervical Nodes:  No lymphadenopathy noted Axillary Nodes:  No palpable lymphadenopathy Psych:  memory intact for recent and remote, normally interactive, and good eye contact.  Impression & Recommendations:  Problem # 1:  THIGH PAIN (ICD-729.5)  ? L 4 radiculopathy from spinal stenosis  Orders: T-Lumbar Spine Complete, 5 Views [phone]) Prescription Created Electronically [phone])  Problem # 2:  HEMATOSPERMIA (NWG-956.21)  Orders: Urology Referral (Urology)  Complete Medication List: 1)  Astelin 137 Mcg/spray Soln (Azelastine hcl) .... 2spray each nostril two times a day 2)  Fexofenadine Hcl 180 Mg Tabs (Fexofenadine hcl) .[name].. 1 by mouth qd 3)  Protonix 40 Mg Tbec (Pantoprazole  sodium) .[name].. 1 by mouth qd 4)  Bayer Low Strength 81 Mg Tbec (Aspirin) .[name].. 1 by mouth once daily 5)  Amlodipine Besylate 5 Mg Tabs (Amlodipine besylate) .[name].. 1 by mouth once daily 6)  Hydrochlorothiazide 12.5 Mg Caps (Hydrochlorothiazide) .[name].. 1 by mouth once daily 7)  Allergy Injections  .... 2 injections once weekly 8)  Nasonex 50 Mcg/act Susp (Mometasone furoate) .[name].. 1-2 sprays daily 9)  Azelastine Hcl 0.05 % Soln (Azelastine hcl) .... As needed for allergies 10)  Singulair 10 Mg Tabs (Montelukast sodium) .... As needed 11)  Meclizine Hcl 12.5 Mg Tabs (Meclizine hcl) .[name].. 12.5mg -25mg  as needed for vertigo 12)  Gabapentin 100 Mg Caps (Gabapentin) .[name].. 1 every 8 hrs as needed for thigh pain  Other Orders: Flu Vaccine 46yrs + MEDICARE PATIENTS (H0865) Administration Flu vaccine - MCR (H8469)  Patient Instructions: 1)  Please see Dr Marcello Fennel concerning hematospermia. Prescriptions: GABAPENTIN 100 MG CAPS (GABAPENTIN) 1 every 8 hrs as needed for thigh pain  #30 x 5   Entered and Authorized by:   Marga Melnick MD   Signed by:   Marga Melnick MD on 12/05/2009   Method used:   Faxed to ...       [facility]* (retail)       [address]       [place], [place]  [address]       Ph: [phone]       Fax: [phone]   RxID:   [id number]    Orders Added: 1)  Flu Vaccine 30yrs + MEDICARE PATIENTS [Q2039] 2)  Administration Flu vaccine - MCR [G0008] 3)  Est. Patient Level IV [18841] 4)  T-Lumbar Spine Complete, 5 Views [71110TC] 5)  Urology Referral [Urology] 6)  Prescription Created Electronically [phone] Flu Vaccine Consent Questions     Do you have a history of severe allergic reactions to this vaccine? no    Any prior history of allergic reactions to egg and/or gelatin? no    Do you have a sensitivity to the preservative Thimersol? no    Do you have a past history of Guillan-Barre Syndrome? no    Do you currently have an acute febrile illness? no    Have  you ever had a severe reaction to latex? no    Vaccine information given and explained to patient? yes    Are you currently pregnant? no    Lot Number:[id number]   Exp Date:07/22/2010   Site Given  Left Deltoid IM+ MEDICARE PATIENTS [Q2039] 2)  Administration Flu vaccine - MCR [G0008]      .lbmedflu

## 2010-02-23 NOTE — Assessment & Plan Note (Signed)
Summary: np6/chest pains/abnormal ekg      Allergies Added:   Primary Jermane Brayboy:  Marga Melnick, M.D.   History of Present Illness:   This is a 70 year old male who was seen in [facility] ER 12/5 with Chest pain.  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  he came in to take a shower.  Sat down.  developed sharp L sided CP.  Not pleuritc.  No positional.  NoSOB or N.  No reflux.   The pain did not go away and 911 was called.  He wnet to [facility] ER and was seen, sent home. He has not had any chest pain since.   Current Medications (verified): 1)  Astelin 137 Mcg/spray Soln (Azelastine Hcl) .... 2spray Each Nostril Two Times A Day 2)  Fexofenadine Hcl 180 Mg Tabs (Fexofenadine Hcl) .[name].. 1 By Mouth Qd 3)  Protonix 40 Mg  Tbec (Pantoprazole Sodium) .[name].. 1 By Mouth Qd 4)  Bayer Low Strength 81 Mg Tbec (Aspirin) .[name].. 1 By Mouth Once Daily 5)  Amlodipine Besylate 5 Mg Tabs (Amlodipine Besylate) .[name].. 1 By Mouth At Bedtime 6)  Hydrochlorothiazide 12.5 Mg Caps (Hydrochlorothiazide) .[name].. 1 By Mouth Once Daily 7)  Allergy Injections .... 2 Injections Once Weekly 8)  Nasonex 50 Mcg/act Susp (Mometasone Furoate) .[name].. 1-2 Sprays Daily As Needed 9)  Azelastine Hcl 0.05 % Soln (Azelastine Hcl) .... As Needed For Allergies 10)  Singulair 10 Mg Tabs (Montelukast Sodium) .... As Needed 11)  Meclizine Hcl 12.5 Mg Tabs (Meclizine Hcl) .[name].. 12.5mg -25mg  As Needed For Vertigo 12)  Gabapentin 100 Mg Caps (Gabapentin) .[name].. 1 Every 8 Hrs As Needed For Thigh Pain 13)  Micardis 40 Mg Tabs (Telmisartan) .[name].. 1 By Mouth Once Daily  Allergies (verified): 1)  ! Adhesive Tape 2)  Lodine 3)  Valium 4)  * Iodine and Mecurichrome  Past History:  Family History: Last updated: 12/14/2009 Family History of Breast Cancer:sister and daughter No FH of Colon Cancer: Father: DUD Mother: arthritis Siblings: bro: DM, TICs , CABG; sister: AF ,ulcer; sister: breast cancer;MGM : CVA; P uncl : MI  in 65s  Social History: Last updated: 12/14/2009 Patient has never smoked.  Alcohol Use - yes: minimally Retired but active in Agricultural consultant work Married  Past medical, surgical, family and social histories (including risk factors) reviewed, and no changes noted (except as noted below).  Past Medical History: Reviewed history from 12/14/2009 and no changes required. Benign prostatic hypertrophy Anemia-NOS Hyperlipidemia: NMR Lipoprofile 2007: LDL 119(882/77), HDL 65, TG 82. LDL goal < 160 Polycystic kidneys, Dr Geanie Berlin Diverticulosis, colon Bullous Pemphigoid, Dr Karlyn Agee  Past Surgical History: Reviewed history from 12/14/2009 and no changes required. Hernia , umbilical 2006 Tonsillectomy Colonoscopy 2004 (?) neg; 2009 diverticulosis  Family History: Reviewed history from 12/14/2009 and no changes required. Family History of Breast Cancer:sister and daughter No FH of Colon Cancer: Father: DUD Mother: arthritis Siblings: bro: DM, TICs , CABG; sister: AF ,ulcer; sister: breast cancer;MGM : CVA; P uncl : MI in 63s  Social History: Reviewed history from 12/14/2009 and no changes required. Patient has never smoked.  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  NEg to the above problem except as noted above.  Vital Signs:  Patient profile:   70 year old male Height:      71.25 inches Weight:  177 pounds BMI:     24.60 Pulse rate:   66 / minute Resp:     16 per minute BP sitting:   112 / 76  (left arm)  Vitals Entered By: Marrion Coy, CNA (January 19, 2010 12:00 PM)  Physical Exam  Additional Exam:  Patient is in NAD HEENT:  Normocephalic, atraumatic. EOMI, PERRLA.  Neck: JVP is normal. No thyromegaly. No bruits.  Lungs: clear to auscultation. No rales no wheezes.  Heart: Regular rate and rhythm. Normal S1, S2. No S3.   No significant murmurs. PMI not displaced.  Abdomen:   Supple, nontender. Normal bowel sounds. No masses. No hepatomegaly.  Extremities:   Good distal pulses throughout. No lower extremity edema.  Musculoskeletal :moving all extremities.  Neuro:   alert and oriented x3.    EKG  Procedure date:  01/19/2010  Findings:      Sinus bradycardia  59 bpm.  First dege AV block.  Impression & Recommendations:  Problem # 1:  CHEST PAIN (ICD-786.50) Atypical.  I donitint think it was cardiac in orign.   He has not had a recurrence.   I would follow .  Would not schedule any tests.  If recurs or develops other chest pains consider work up.  Problem # 2:  HYPERTENSION (ICD-401.9) Good control  Problem # 3:  HYPERLIPIDEMIA (ICD-272.4) Not on meds.  Discussed diet.   Appended Document: np6/chest pains/abnormal ekg EKG:  Sinus bradycardia.  59 bpm.  First degree AV block

## 2010-02-23 NOTE — Letter (Signed)
Summary: [facility]  [facility]   Imported By: Lanelle Bal 01/02/2010 10:53:55  _____________________________________________________________________  External Attachment:    Type:   Image     Comment:   External Document

## 2010-02-23 NOTE — Progress Notes (Signed)
Summary: Protonix refill  Medications Added PROTONIX 40 MG  TBEC (PANTOPRAZOLE SODIUM) 1 by mouth qd       Phone Note Refill Request Message from:  Fax from Pharmacy on January 03, 2010 10:20 AM  Refills Requested: Medication #1:  PROTONIX 40 MG  TBEC 1 by mouth qd   Dosage confirmed as above?Dosage Confirmed   Brand Name Necessary? No   Supply Requested: 6 months  Method Requested: Electronic Initial call taken by: Merri Ray CMA Duncan Dull),  January 03, 2010 10:21 AM    New/Updated Medications: PROTONIX 40 MG  TBEC (PANTOPRAZOLE SODIUM) 1 by mouth qd Prescriptions: PROTONIX 40 MG  TBEC (PANTOPRAZOLE SODIUM) 1 by mouth qd  #30 x 6   Entered by:   Merri Ray CMA (AAMA)   Authorized by:   Louis Meckel MD   Signed by:   Merri Ray CMA (AAMA) on 01/03/2010   Method used:   Electronically to        [facility]* (retail)       [address]       [place], [place]  [id number]       Ph: [phone]       Fax: [phone]   RxID:   [id number]

## 2010-04-03 LAB — BASIC METABOLIC PANEL
CO2: 26 mEq/L (ref 19–32)
Calcium: 8.6 mg/dL (ref 8.4–10.5)
Creatinine, Ser: 1.32 mg/dL (ref 0.4–1.5)
GFR calc Af Amer: >60 mL/min (ref >60)
Glucose, Bld: 90 mg/dL (ref 70–99)

## 2010-04-03 LAB — POCT CARDIAC MARKERS

## 2010-04-03 LAB — CBC
MCH: 30.8 pg (ref 26.0–34.0)
MCHC: 33.7 g/dL (ref 30.0–36.0)
Platelets: 176 10*3/uL (ref 150–400)
RDW: 13 % (ref 11.5–15.5)

## 2010-06-06 NOTE — Assessment & Plan Note (Signed)
[facility]                         GASTROENTEROLOGY OFFICE NOTE   Henry Adams, Henry Adams                        MRN:          [id number]  DATE:09/02/2006                            DOB:          1940-07-04    PROBLEM:  Dyspepsia.   Henry Adams has returned for scheduled GI followup.  Upper endoscopy was  entirely normal.  CT of the abdomen prompted by palpable abdominal  masses demonstrated mass effect on the large and small bowel loops  secondary to his polycystic kidney disease, and a complex left-sided  renal cyst without evidence of enhancing mass.  Henry Adams is feeling  well and currently has no GI complaints.  He remains on Protonix 40 mg a  day.  He states clearly that his symptoms are improved with Protonix  compared with Aciphex.   ON EXAM:  VITAL SIGNS:  Pulse 72.  Blood pressure 100/70.  Weight 173.   IMPRESSION:  Nonspecific dyspepsia.   RECOMMENDATIONS:  Continue Protonix as stated.     Barbette Hair. Arlyce Dice, MD,FACG  Electronically Signed    RDK/MedQ  DD: 09/02/2006  DT: 09/02/2006  Job #: [id number]   cc:   Titus Dubin. Alwyn Ren, MD,FACP,FCCP  Terrial Rhodes, M.D.

## 2010-06-06 NOTE — Assessment & Plan Note (Signed)
[name]                         GASTROENTEROLOGY OFFICE NOTE   Henry Adams, Henry Adams                        MRN:          [id number]  DATE:06/27/2006                            DOB:          Apr 05, 1940    REASON FOR CONSULTATION:  Abdominal discomfort.   REASON:  Henry Adams is a 70 year old white male referred through the  courtesy of Dr. Alwyn Ren for evaluation. For the past month he has been  complaining of early satiety, post prandial fullness, mild nausea, and  generalized indigestion. He is having excess eructation. He denies  pyrosis, per se. There is no history of dysphagia. He has been on  Aciphex for over 10 years but feels like the medicine does not work.  When he has tried this twice a day symptoms have not improved. He also  complains of intermittent nausea. He has lost 8 to 10 pounds over the  past 6 months which he attributes to changes in diet. There is no change  in bowel habits or melena. He underwent screening colonoscopy in 2003  that demonstrated sigmoid diverticula. An esophageal stricture was  dilated in 2000.   PAST MEDICAL HISTORY:  Pertinent for hypertension, he has polycystic  disease, he is status post herniorrhaphy.   FAMILY HISTORY:  Pertinent for brother with heart disease.   MEDICATIONS:  Include;  1. Aciphex 20 mg a day.  2. Micardis.  3. Norvasc.  4. Allegra.  5. Astelin.  6. Baby aspirin.  7. Singulair.   HE IS ALLERGIC TO VALIUM, MERCUROCHROME, AND A BAND-AID ADHESIVE.   He neither smokes, nor drinks. He is married and worked for [employer].   REVIEW OF SYSTEMS:  Positive for urinary changes, and fatigue, and loss  of hearing.   PHYSICAL EXAMINATION:  Pulse 68, blood pressure 100/66, weight 173.  HEENT: EOMI. PERRLA. Sclerae are anicteric.  Conjunctivae are pink.  NECK:  Supple without thyromegaly, adenopathy or carotid bruits.  CHEST:  Clear to auscultation and percussion without adventitious  sounds.  CARDIAC:  Regular rhythm; normal S1 S2.  There are no murmurs, gallops  or rubs.  ABDOMEN:  He has a palpable mass measuring at least 2 x 5 cm, that is  mobile and nontender, located in the left upper quadrant. There is no  organomegaly.  EXTREMITIES:  Full range of motion.  No cyanosis, clubbing or edema.  RECTAL:  Deferred.   IMPRESSION:  Dyspepsia. This could be due to ulcer, or a nonulcer  dyspepsia, or related to gastroesophageal reflux disease. It is note  worthy that he has an abdominal mass which could represent a neoplasm.  Alternatively, I may be palpating a large renal cyst.   RECOMMENDATION:  1. CT of the abdomen and pelvis.  2. Trial of Protonix or Prevacid in leu of Aciphex.  3. Upper endoscopy.     Barbette Hair. Arlyce Dice, MD,FACG  Electronically Signed    RDK/MedQ  DD: 06/27/2006  DT: 06/27/2006  Job #: [id number]   cc:   Titus Dubin. Alwyn Ren, MD,FACP,FCCP  Terrial Rhodes, M.D.

## 2010-06-06 NOTE — Assessment & Plan Note (Signed)
[facility]                        [place] OFFICE NOTE   ABIE, CHEEK                        MRN:          [id number]  DATE:06/03/2006                            DOB:          Jan 21, 1941    Marion was seen in follow up for his reflux after 6 months.  He was seen  on March 06, 2006 and was on AcipHex twice a day.  At that time, he  was planning a trip to [place].  He had no sequelae or complications  while in [place].  He has dropped the AcipHex to once a day and feels he  has no symptoms at this time.  He presently describes reflux as a 1 or a  2 on a 10 scale as far as symptomatology.  He will have dyspepsia  symptoms approximately one time a week.  Triggers are chocolate or  excess amounts of food.   He has been on AcipHex since 2001.  Prior to that, he had been on  Pepcid, Tagamet and Omeprazole, none of which controlled his symptoms.   Significantly, in 1991, he had esophageal dilatation for a stricture  that presented as chest pain and dysphagia.   At this time, he denies dysphagia, melena or other GI symptoms, except  for minor flatulence.   His weight is down 2.5 pounds to 177, which he states is related to  diet.  The oropharynx reveals no erythema.  The abdomen is unremarkable  without organomegaly, masses or tenderness.  He has no lymphadenopathy.   I will renew the AcipHex, but I would encourage him to be seen by  [facility], in view of the significant past history 17  years ago and the fact that he has been on AcipHex for 7 years.  This  would be in compliance with the guidelines of the American  Gastroenterologic Association.   A GI consult will be pursued, and further work-up referred to their  expertise.     Titus Dubin. Alwyn Ren, MD,FACP,FCCP  Electronically Signed    WF[facility]/MedQ  DD: 06/03/2006  DT: 06/03/2006  Job #: [id number]

## 2010-06-09 NOTE — Op Note (Signed)
NAME:  Henry Adams, ROWE NO.:  [id number]   MEDICAL RECORD NO.:  [id number]          PATIENT TYPE:  EMS   LOCATION:  URG                          FACILITY:  [facility]   PHYSICIAN:  Ollen Gross. Vernell Morgans, M.D. DATE OF BIRTH:  1940-03-19   DATE OF PROCEDURE:  01/16/2005  DATE OF DISCHARGE:                                 OPERATIVE REPORT   PREOPERATIVE DIAGNOSIS:  Umbilical hernia.   POSTOPERATIVE DIAGNOSIS:  Umbilical hernia.   PROCEDURE:  Umbilical hernia repair.   SURGEON:  Dr. Carolynne Edouard   ANESTHESIA:  General.   PROCEDURE:  After informed consent was obtained, the patient was brought to  the operating room and placed in a supine position on the operating room  table.  After adequate induction of general anesthesia, the patient's  abdomen was prepped with Betadine and draped in usual sterile manner.  The  area above the umbilicus was then infiltrated with 0.25% Marcaine.  A small  transverse incision was made just above the umbilicus.  This incision was  carried down through the skin and subcutaneous tissue sharply with the  electrocautery.  Blunt dissection of the subcutaneous tissue was then  carried out until the hernia sac was identified.  It was separated from the  rest of the subcutaneous tissue by a combination of blunt hemostat  dissection and sharp dissection with the electrocautery.  The hernia sac was  then opened, and only some preperitoneal fat was identified.  The edges of  the defect were then cleaned of any adhesions by sharp dissection with the  electrocautery.  The hernia defect itself was very small, less than a  centimeter.  The defect was then closed with interrupted #1 Novofil  stitches, and the repair appeared to be intact without any tension.  The  subcutaneous tissue was then closed with interrupted 3-0 Vicryl stitches,  and the skin was closed with running 4-0 Monocryl subcuticular stitch.  Benzoin, Steri-Strips, and sterile dressings were applied.   The patient  tolerated the procedure well.  At the end of the case, all needle, sponge,  and instrument counts were correct.  The patient was then awakened and taken  to recovery in stable condition.      Ollen Gross. Vernell Morgans, M.D.  Electronically Signed     PST/MEDQ  D:  01/16/2005  T:  01/16/2005  Job:  [id number]

## 2010-09-12 ENCOUNTER — Encounter: Payer: Self-pay | Admitting: Internal Medicine

## 2010-09-12 ENCOUNTER — Ambulatory Visit (INDEPENDENT_AMBULATORY_CARE_PROVIDER_SITE_OTHER): Payer: Medicare Other | Admitting: Internal Medicine

## 2010-09-12 VITALS — BP 112/70 | HR 119 | Temp 98.1°F | Wt 179.2 lb

## 2010-09-12 DIAGNOSIS — L129 Pemphigoid, unspecified: Secondary | ICD-10-CM

## 2010-09-12 DIAGNOSIS — W57XXXA Bitten or stung by nonvenomous insect and other nonvenomous arthropods, initial encounter: Secondary | ICD-10-CM

## 2010-09-12 DIAGNOSIS — L120 Bullous pemphigoid: Secondary | ICD-10-CM

## 2010-09-12 DIAGNOSIS — T148 Other injury of unspecified body region: Secondary | ICD-10-CM

## 2010-09-12 MED ORDER — DOXYCYCLINE HYCLATE 100 MG PO TABS: 100.0000 mg | ORAL_TABLET | Freq: Two times a day (BID) | ORAL | Status: AC

## 2010-09-12 NOTE — Progress Notes (Signed)
  Subjective:    Patient ID: Henry Adams, male    DOB: 03/21/1940, 70 y.o.   MRN: [id number]  HPI 8/16 after  he walked through field; he found > 100 immature ticks on clothing. On 8/17 & 8/18 he found 12  Imbedded ticks which he removed by scrapping. This same period time he noted diffuse bullae  of  lower extremities, not necessarily at the site of the embedded ticks.  He denies headache, fever, chills, or sweats.  Significantly he has a past medical history bullous pemphigoid for which he saw Dr. Gaye Pollack, dermatologist..  He has been applying triple antibiotic ointment to the tick bite sites.    Review of Systems edema began after the events above.     Objective:   Physical Exam he is in no acute distress;he  has no increase work of breathing  He is most scleral icterus or conjunctivitis  Chest is clear without rhonchi or wheezes he  Has a slow S4. Pulse rate was 70 on recheck.  He is no organomegaly or masses of the abdomen  He has no lymphadenopathy in the head, neck, axilla, or inguinal areas.  He has scattered bullous lesions over the lower extremities. There are scattered excoriated areas which he describes as site of tick bites.  He has some dependent ankle edema          Assessment & Plan:  #1 tick bites without active cellulitis  #2 bullous pemphigoid, exacerbation  Plan: Acute RMSF titer will be collected and he  be placed on doxycycline. He is to  avoid direct sun while on the doxycycline. He should stop the Triple Antibiotic ointment as this has high risk of causing contact dermatitis.

## 2010-09-12 NOTE — Patient Instructions (Signed)
Dip gauze in  sterile saline and applied to the wound twice a day. Cover the wound with Telfa , non stick dressing  without any antibiotic ointment. The saline can be purchased at the drugstore or you can make your own .Boil cup of salt in a gallon of water. Store mixture  in a clean container.Report Warning  signs as discussed (red streaks, pus, fever, increasing pain). STOP antibiotic ointment

## 2010-09-13 LAB — ROCKY MTN SPOTTED FVR AB, IGG-BLOOD: RMSF IgG: 0.12 IV

## 2011-04-04 ENCOUNTER — Other Ambulatory Visit: Payer: Self-pay | Admitting: Nephrology

## 2011-04-04 DIAGNOSIS — Q613 Polycystic kidney, unspecified: Secondary | ICD-10-CM

## 2011-04-07 ENCOUNTER — Other Ambulatory Visit: Payer: Medicare Other

## 2011-04-11 ENCOUNTER — Ambulatory Visit
Admission: RE | Admit: 2011-04-11 | Discharge: 2011-04-11 | Disposition: A | Discharge status: Home / Self Care | Payer: Medicare Other | Source: Ambulatory Visit | Attending: Nephrology | Admitting: Nephrology

## 2011-04-11 DIAGNOSIS — Q613 Polycystic kidney, unspecified: Secondary | ICD-10-CM

## 2011-05-30 ENCOUNTER — Telehealth: Payer: Self-pay | Admitting: Internal Medicine

## 2011-05-30 NOTE — Telephone Encounter (Signed)
Left message on voice mail with Dr.Hopper's instruction  

## 2011-05-30 NOTE — Telephone Encounter (Signed)
Caller: Antjuan/Patient; PCP: Marga Melnick; CB#: [phone]; Call regarding Concerns for Vertigo and Wanting Something Called in; Reports history of same and he is currently in [place].  Dizzy for 3-4 days and sx increased on 05/29/11.  Able to get up, "it feels like that vertigo coming on".  See in 24 hours per Dizziness or Vertigo protocol.  Caller states he is getting ready to go on a tour and requests Rx for same.  He states he would like [facility] [phone].  Caller would like to be called back either way. He also asks if MD would recommend Meclizine OTC. Thank you.

## 2011-05-30 NOTE — Telephone Encounter (Signed)
Meclizine over-the-counter at maximal doses as per label would be the most appropriate. If symptoms do not respond to max dose of  25 mg every 8 hours  as needed; he should be seen @ urgent care.

## 2011-07-02 ENCOUNTER — Encounter: Payer: Self-pay | Admitting: Internal Medicine

## 2011-07-02 ENCOUNTER — Ambulatory Visit (INDEPENDENT_AMBULATORY_CARE_PROVIDER_SITE_OTHER): Payer: Medicare Other | Admitting: Internal Medicine

## 2011-07-02 VITALS — BP 122/80 | HR 67 | Temp 97.6°F | Wt 182.0 lb

## 2011-07-02 DIAGNOSIS — N401 Enlarged prostate with lower urinary tract symptoms: Secondary | ICD-10-CM

## 2011-07-02 DIAGNOSIS — D649 Anemia, unspecified: Secondary | ICD-10-CM

## 2011-07-02 DIAGNOSIS — I10 Essential (primary) hypertension: Secondary | ICD-10-CM

## 2011-07-02 DIAGNOSIS — N400 Enlarged prostate without lower urinary tract symptoms: Secondary | ICD-10-CM

## 2011-07-02 DIAGNOSIS — E785 Hyperlipidemia, unspecified: Secondary | ICD-10-CM

## 2011-07-02 DIAGNOSIS — N138 Other obstructive and reflux uropathy: Secondary | ICD-10-CM

## 2011-07-02 DIAGNOSIS — R9431 Abnormal electrocardiogram [ECG] [EKG]: Secondary | ICD-10-CM

## 2011-07-02 DIAGNOSIS — Z0000 Encounter for general adult medical examination without abnormal findings: Secondary | ICD-10-CM

## 2011-07-02 DIAGNOSIS — R42 Dizziness and giddiness: Secondary | ICD-10-CM

## 2011-07-02 DIAGNOSIS — Q613 Polycystic kidney, unspecified: Secondary | ICD-10-CM

## 2011-07-02 LAB — CBC WITH DIFFERENTIAL/PLATELET
Basophils Relative: 0.6 % (ref 0.0–3.0)
Eosinophils Absolute: 0.3 10*3/uL (ref 0.0–0.7)
MCHC: 32.4 g/dL (ref 30.0–36.0)
MCV: 94.2 fl (ref 78.0–100.0)
Monocytes Absolute: 0.6 10*3/uL (ref 0.1–1.0)
Neutrophils Relative %: 45 % (ref 43.0–77.0)
Platelets: 162 10*3/uL (ref 150.0–400.0)
RBC: 4.51 Mil/uL (ref 4.22–5.81)
RDW: 13.7 % (ref 11.5–14.6)

## 2011-07-02 LAB — LIPID PANEL
HDL: 62.2 mg/dL (ref >39.00)
Triglycerides: 86 mg/dL (ref 0.0–149.0)

## 2011-07-02 LAB — HEPATIC FUNCTION PANEL
Albumin: 3.8 g/dL (ref 3.5–5.2)
Bilirubin, Direct: 0.1 mg/dL (ref 0.0–0.3)
Total Protein: 6.9 g/dL (ref 6.0–8.3)

## 2011-07-02 LAB — BASIC METABOLIC PANEL
BUN: 21 mg/dL (ref 6–23)
CO2: 30 mEq/L (ref 19–32)
Chloride: 104 mEq/L (ref 96–112)
Creatinine, Ser: 1.5 mg/dL (ref 0.4–1.5)
Glucose, Bld: 90 mg/dL (ref 70–99)

## 2011-07-02 LAB — TSH: TSH: 3.13 u[IU]/mL (ref 0.35–5.50)

## 2011-07-02 LAB — PSA: PSA: 1.73 ng/mL (ref 0.10–4.00)

## 2011-07-02 NOTE — Progress Notes (Signed)
Subjective:    Patient ID: Henry Adams, male    DOB: 10-19-1940, 71 y.o.   MRN: [id number]  HPI Medicare Wellness Visit:  The following psychosocial & medical history were reviewed as required by Medicare.   Social history: caffeine: 2 cups / month , alcohol:  rarely ,  tobacco use : never  & exercise : no program but active.   Home & personal  safety / fall risk: occasional dizziness ? Related to change in lenses, activities of daily living: no limitations , seatbelt use : yes , and smoke alarm employment : yes .  Power of Attorney/Living Will status : in place  Vision ( as recorded per Nurse) & Hearing  evaluation : see exam; Ophth appt to be made. Hearing aids from E[facility] Orientation :oriented X 3 , memory & recall : good, spelling or math testing: good,and mood & affect :normal . Depression / anxiety: denied Travel history : 2000 [place] , immunization status :up to date  , transfusion history: no, and preventive health surveillance ( colonoscopies, BMD , etc as per protocol/ [facility]): colonoscopy, Dental care:  Every 6 mos . Chart reviewed &  Updated. Active issues reviewed & addressed.       Review of Systems HYPERTENSION: Disease Monitoring: Blood pressure range-125/75 -77  Chest pain, palpitations-no      Dyspnea-no Medications: Compliance- yes  Lightheadedness,Syncope- constantly  lightheaded    Edema- minor  HYPERLIPIDEMIA: Disease Monitoring: See symptoms for Hypertension Medications: Compliance- on diet only  Abd pain, bowel changes- no   Muscle aches- no  Nephrologist states that the cyst or enlarging based on imaging. He denies dysuria, hematuria, pyuria or frequency        Objective:   Physical Exam Gen.: Healthy and well-nourished in appearance. Alert, appropriate and cooperative throughout exam. Head: Normocephalic without obvious abnormalities  Eyes: No corneal or conjunctival inflammation noted. Pupils equal round reactive to light and accommodation. Fundal  exam is benign without hemorrhages, exudate, papilledema. Extraocular motion intact. Vision grossly normal with contacts. Mild arcus. No nystagmus present Ears: External  ear exam reveals no significant lesions or deformities. Canals clear .TMs normal. Hearing aids  bilaterally. Nose: External nasal exam reveals no deformity or inflammation. Nasal mucosa are pink and moist. No lesions or exudates noted. Septum to R  Mouth: Oral mucosa and oropharynx reveal no lesions or exudates. Teeth in good repair. Neck: No deformities, masses, or tenderness noted. Range of motion & Thyroid normal Lungs: Normal respiratory effort; chest expands symmetrically. Lungs are clear to auscultation without rales, wheezes, or increased work of breathing.Mild pectus; some flaring of the inferior thoracic margins Heart: Normal rate and rhythm. Normal S1 and S2. No gallop, click, or rub. S4 w/o murmur. Abdomen: Bowel sounds normal; abdomen soft and nontender. No masses, organomegaly .Ventral hernia noted. Genitalia/ DRE: Genital exam unremarkable. Mild prostatic hypertrophy with some induration of the right lobe. No nodules present                                                        Musculoskeletal/extremities: No deformity or scoliosis noted of  the thoracic or lumbar spine. No clubbing, cyanosis, edema noted. Range of motion  normal .Tone & strength  normal.Joints : OA changes in fingers. Nail health  good. Vascular: Carotid, radial artery, dorsalis pedis and  posterior tibial pulses are full and equal. No bruits present. Neurologic: Alert and oriented x3. Deep tendon reflexes symmetrical and normal. Gait, Romberg testing, and finger-nose testing normal         Skin: Intact without suspicious lesions or rashes. Scattered subcutaneous lipomata Lymph: No cervical, axillary, or inguinal lymphadenopathy present. Psych: Mood and affect are normal. Normally interactive                                                                                          Assessment & Plan:  #1 Medicare Wellness Exam; criteria met ; data entered #2 Problem List reviewed ; Assessment/ Recommendations made  #3 dizziness, nonexertional. This may relate to in inner ear issues.  #4 prostatic induration without nodularity Plan: see Orders

## 2011-07-02 NOTE — Patient Instructions (Signed)
Preventive Health Care: Exercise at least 30-45 minutes a day,  3-4 days a week.  Eat a low-fat diet with lots of fruits and vegetables, up to 7-9 servings per day.  Please try to go on My Chart within the next 24 hours to allow me to release the results directly to you.  

## 2011-07-03 ENCOUNTER — Encounter: Payer: Self-pay | Admitting: Internal Medicine

## 2011-07-28 ENCOUNTER — Encounter (HOSPITAL_COMMUNITY): Payer: Self-pay

## 2011-07-28 ENCOUNTER — Emergency Department (HOSPITAL_COMMUNITY)
Admission: EM | Admit: 2011-07-28 | Discharge: 2011-07-28 | Disposition: A | Discharge status: Home / Self Care | Payer: Medicare Other | Attending: Emergency Medicine | Admitting: Emergency Medicine

## 2011-07-28 DIAGNOSIS — L109 Pemphigus, unspecified: Secondary | ICD-10-CM | POA: Insufficient documentation

## 2011-07-28 DIAGNOSIS — I10 Essential (primary) hypertension: Secondary | ICD-10-CM | POA: Insufficient documentation

## 2011-07-28 DIAGNOSIS — Z8249 Family history of ischemic heart disease and other diseases of the circulatory system: Secondary | ICD-10-CM | POA: Insufficient documentation

## 2011-07-28 DIAGNOSIS — K219 Gastro-esophageal reflux disease without esophagitis: Secondary | ICD-10-CM | POA: Insufficient documentation

## 2011-07-28 DIAGNOSIS — IMO0002 Reserved for concepts with insufficient information to code with codable children: Secondary | ICD-10-CM | POA: Insufficient documentation

## 2011-07-28 DIAGNOSIS — W57XXXA Bitten or stung by nonvenomous insect and other nonvenomous arthropods, initial encounter: Secondary | ICD-10-CM

## 2011-07-28 DIAGNOSIS — Z803 Family history of malignant neoplasm of breast: Secondary | ICD-10-CM | POA: Insufficient documentation

## 2011-07-28 MED ORDER — DOXYCYCLINE HYCLATE 100 MG PO TABS
100.0000 mg | ORAL_TABLET | Freq: Once | ORAL | Status: AC
  Administered 2011-07-28: 100 mg via ORAL
  Filled 2011-07-28: qty 1

## 2011-07-28 MED ORDER — DOXYCYCLINE HYCLATE 100 MG PO CAPS: 100.0000 mg | ORAL_CAPSULE | Freq: Two times a day (BID) | ORAL | Status: AC

## 2011-07-28 NOTE — ED Provider Notes (Signed)
History     CSN: [id number]  Arrival date & time 07/28/11  0311   First MD Initiated Contact with Patient 07/28/11 0350      Chief Complaint  Patient presents with  . Insect Bite    (Consider location/radiation/quality/duration/timing/severity/associated sxs/prior treatment) HPI  Henry Adams is a 71 y.o. male who presents to the Emergency Department complaining of tick bite between toes on right foot that has formed a blister. Associated with swelling and tingling to foot. Patient pulled a deer tick off his foot between his 1st and 2nd toes on the right foot yesterday. Today he has a blister at the site. He has a history of bullous pemphigus that has occurred in the past when an area of skin is traumatized. Last year he had many blisters to his lower legs when he disengaged ticks after going in the woods. The blisters persisted through the summer. This current blister is similar to those he had last year. He reports that the tick had not engorged but was firmly attached. He denies any fever, chills, myalgias, rash.  PCP Dr. Alwyn Ren  Past Medical History  Diagnosis Date  . Hypertension   . GERD (gastroesophageal reflux disease)   . Allergy     perennial    Past Surgical History  Procedure Date  . Tonsillectomy and adenoidectomy   . Hernia repair     umbilical  . Colonoscopy 2011    diverticulosis    Family History  Problem Relation Age of Onset  . Hypertension Father   . Cancer Sister     breast  . Heart disease Brother   . Cancer Daughter     breast    History  Substance Use Topics  . Smoking status: Never Smoker   . Smokeless tobacco: Not on file  . Alcohol Use: Yes      2-3 beers / month      Review of Systems  Constitutional: Negative for fever.       10 Systems reviewed and are negative for acute change except as noted in the HPI.  HENT: Negative for congestion.   Eyes: Negative for discharge and redness.  Respiratory: Negative for cough and  shortness of breath.   Cardiovascular: Negative for chest pain.  Gastrointestinal: Negative for vomiting and abdominal pain.  Musculoskeletal: Negative for back pain.  Skin: Negative for rash.       Blister, tingling, swelling  Neurological: Negative for syncope, numbness and headaches.  Psychiatric/Behavioral:       No behavior change.    Allergies  Iodine; Diazepam; and Oxycodone  Home Medications   Current Outpatient Rx  Name Route Sig Dispense Refill  . ASPIRIN 81 MG PO TABS Oral Take 81 mg by mouth daily.      . AZELASTINE HCL 137 MCG/SPRAY NA SOLN Nasal Place 1 spray into the nose 2 (two) times daily. Use in each nostril as directed     . AZELASTINE HCL 0.05 % OP SOLN  1 drop as needed.      [name] FEXOFENADINE HCL 180 MG PO TABS Oral Take 180 mg by mouth daily.      [name] HYDROCHLOROTHIAZIDE 12.5 MG PO CAPS Oral Take 12.5 mg by mouth daily.      [name] MECLIZINE HCL 12.5 MG PO TABS Oral Take 12.5 mg by mouth as needed.      [name] OMEPRAZOLE 20 MG PO CPDR Oral Take 20 mg by mouth 2 (two) times daily.      [name]  TELMISARTAN 40 MG PO TABS Oral Take 40 mg by mouth daily.      [name] VITAMIN C 500 MG PO TABS Oral Take 500 mg by mouth daily.      [name] AMLODIPINE BESYLATE 5 MG PO TABS Oral Take 5 mg by mouth daily.      [name] DOXYCYCLINE HYCLATE 100 MG PO CAPS Oral Take 1 capsule (100 mg total) by mouth 2 (two) times daily. 20 capsule 0  . MOMETASONE FUROATE 50 MCG/ACT NA SUSP Nasal Place 2 sprays into the nose daily.        BP 147/87  Temp 97.8 F (36.6 C) (Oral)  Resp 16  SpO2 96%  Physical Exam  Nursing note and vitals reviewed. Constitutional: He appears well-developed and well-nourished.       Awake, alert, nontoxic appearance.  HENT:  Head: Atraumatic.  Eyes: Right eye exhibits no discharge. Left eye exhibits no discharge.  Neck: Neck supple.  Cardiovascular: Normal heart sounds.   Pulmonary/Chest: Effort normal and breath sounds normal. He exhibits no tenderness.  Abdominal: Soft. There is no  tenderness. There is no rebound.  Musculoskeletal: He exhibits no tenderness.       Baseline ROM, no obvious new focal weakness. Right foot and leg sensation normal to light. Pulses dorsalis pedis and posterior tibialis 2+.  Neurological:       Mental status and motor strength appears baseline for patient and situation.  Skin: No rash noted.       Blister between first and second toe on right foot. No erythema, obvious swelling, or lesions.  Psychiatric: He has a normal mood and affect.    ED Course  Procedures (including critical care time)  Labs Reviewed - No data to display No results found.   1. Tick bite   2. Bullous pemphigus       MDM  Patient with history of bullous pemphigus here with a blister at the site where he proposed a tick off his foot. Begin empiric treatment with doxycycline for RMSF and Lyme disease and potential MRSA. Pt stable in ED with no significant deterioration in condition.The patient appears reasonably screened and/or stabilized for discharge and I doubt any other medical condition or other [facility] requiring further screening, evaluation, or treatment in the ED at this time prior to discharge.  MDM Reviewed: nursing note and vitals           Nicoletta Dress. Colon Branch, MD 07/28/11 [id number]

## 2011-07-28 NOTE — ED Notes (Signed)
Pt states he pulled a deer tick off from between his great toe and 2nd toe on right foot yesterday,  States today with tingling in area and up leg.

## 2012-03-08 ENCOUNTER — Other Ambulatory Visit: Payer: Self-pay

## 2012-06-04 ENCOUNTER — Encounter (HOSPITAL_COMMUNITY): Payer: Self-pay

## 2012-06-04 NOTE — Progress Notes (Signed)
Patient ID: FRANCE NOYCE, male   DOB: Sep 28, 1940, 72 y.o.   MRN: [id number] Patient scheduled for myocardial perfusion study on 06-09-12 although when reviewing the records, the dictated office note is for a stress test. I called and left a message for Dr. Celedonio Miyamoto nurse to call me to verify which test.  Spoke with Corrie Dandy at Dr. Hadley Pen office. He wants the patient to have cardiology office visit first to determine the type of test to be done. Appointment made with Tereso Newcomer, PA-C for 06-30-12 @ 11:30am. Irean Hong, RN.

## 2012-06-09 ENCOUNTER — Encounter (HOSPITAL_COMMUNITY): Payer: Medicare Other

## 2012-06-30 ENCOUNTER — Encounter: Payer: Self-pay | Admitting: Physician Assistant

## 2012-06-30 ENCOUNTER — Ambulatory Visit (INDEPENDENT_AMBULATORY_CARE_PROVIDER_SITE_OTHER): Payer: Medicare Other | Admitting: Physician Assistant

## 2012-06-30 VITALS — BP 118/82 | HR 64 | Ht 71.25 in | Wt 177.4 lb

## 2012-06-30 DIAGNOSIS — R5383 Other fatigue: Secondary | ICD-10-CM

## 2012-06-30 DIAGNOSIS — R5381 Other malaise: Secondary | ICD-10-CM

## 2012-06-30 DIAGNOSIS — I10 Essential (primary) hypertension: Secondary | ICD-10-CM

## 2012-06-30 DIAGNOSIS — N183 Chronic kidney disease, stage 3 unspecified: Secondary | ICD-10-CM

## 2012-06-30 NOTE — Progress Notes (Signed)
[address] [place], [place]  [address] Phone: [phone] Fax:  [phone]  Date:  06/30/2012   ID:  Henry Adams, Henry Adams 08/27/1940, MRN [id number]  PCP:  Marga Melnick, MD  Cardiologist:  Dr. Dietrich Pates     History of Present Illness: Henry Adams is a 72 y.o. male who returns for the evaluation of exertional fatigue at the request of his nephrologist (Dr. Arrie Aran).  He has a hx of CKD 2/2 polycystic kidney disease, HTN, HL.  He was seen by Dr. Dietrich Pates in 12/2009 after a visit to the ED for chest pain.  No further intervention was recommended.     The patient tells me that he is typically very active. It is not unusual for him to work outside for several hours. Recently, he has noted significant exhaustion that has resulted in him stopping his activity sooner than he would like. This typically occurs after working for 3-4 hours. He notes significant nausea. He denies emesis. He denies chest discomfort or shortness of breath. He denies arm or jaw pain. He denies associated diaphoresis. He denies syncope or near-syncope. He denies orthopnea, PND or significant pedal edema.  Labs (6/13):  K 3.9, Cr 1.5, ALT 16, LDL 131.7, Hgb 13.8, TSH 3.13 Labs (4/14):  K 4.4, Cr 1.48, Hgb 13.6  Wt Readings from Last 3 Encounters:  06/30/12 177 lb 6.4 oz (80.468 kg)  07/02/11 182 lb (82.555 kg)  09/12/10 179 lb 3.2 oz (81.285 kg)     Past Medical History  Diagnosis Date  . Hypertension   . GERD (gastroesophageal reflux disease)   . Allergy     perennial  . CKD (chronic kidney disease)   . Polycystic kidney disease   . HLD (hyperlipidemia)     Current Outpatient Prescriptions  Medication Sig Dispense Refill  . amLODipine (NORVASC) 5 MG tablet Take 5 mg by mouth daily.        [name] aspirin 81 MG tablet Take 81 mg by mouth daily.        [name] azelastine (ASTELIN) 137 MCG/SPRAY nasal spray Place 1 spray into the nose 2 (two) times daily. Use in each nostril as directed         . azelastine (OPTIVAR) 0.05 % ophthalmic solution 1 drop as needed.        . fexofenadine (ALLEGRA) 180 MG tablet Take 180 mg by mouth daily.        . hydrochlorothiazide (,MICROZIDE/HYDRODIURIL,) 12.5 MG capsule Take 12.5 mg by mouth daily.        . meclizine (ANTIVERT) 12.5 MG tablet Take 12.5 mg by mouth as needed.        [name] omeprazole (PRILOSEC) 20 MG capsule Take 20 mg by mouth 2 (two) times daily.        [name] telmisartan (MICARDIS) 40 MG tablet Take 40 mg by mouth daily.        . vitamin C (ASCORBIC ACID) 500 MG tablet Take 500 mg by mouth daily.         No current facility-administered medications for this visit.    Allergies:    Allergies  Allergen Reactions  . Iodine     rash  . Diazepam     Mental status changes   . Oxycodone     Mental status changes & nausea    Social History:  The patient  reports that he has never smoked. He does not have any smokeless tobacco history on file. He  reports that  drinks alcohol. He reports that he does not use illicit drugs.   Family History: Significant for CAD in his brother who recently underwent CABG at age 74.  ROS:  Please see the history of present illness.   He lost 10 pounds about 6 months ago.   All other systems reviewed and negative.   PHYSICAL EXAM: VS:  BP 118/82  Pulse 64  Ht 5' 11.25" (1.81 m)  Wt 177 lb 6.4 oz (80.468 kg)  BMI 24.56 kg/m2 Well nourished, well developed, in no acute distress HEENT: normal Neck: no JVD Vascular: No carotid bruits Cardiac:  normal S1, S2; RRR; no murmur Lungs:  clear to auscultation bilaterally, no wheezing, rhonchi or rales Abd: soft, nontender, no hepatomegaly Ext: no edema Skin: warm and dry Neuro:  CNs 2-12 intact, no focal abnormalities noted  EKG:  NSR, HR 64, LAD, no significant change since prior tracings     ASSESSMENT AND PLAN:  1. Exhaustion: He has exertional exhaustion that is unusual for him with associated nausea. Question anginal equivalent. He has significant  risk factors for coronary artery disease. Arrange ETT-Myoview.  Check TSH. 2. Hypertension:  Controlled.  Continue current therapy.  3. CKD:  F/u with nephrology.  Would try to avoid cardiac cath if possible.   4. Disposition:  F/u with me or Dr. Dietrich Pates in 1 month.   Signed, Tereso Newcomer, PA-C  06/30/2012 11:56 AM

## 2012-06-30 NOTE — Patient Instructions (Addendum)
LAB TODAY; TSH  PLEASE SCHEDULE TO HAVE A STRESS MYOVIEW DONE  PLEASE FOLLOW UP WITH DR. ROSS IN 1 MONTH AND IF NOT AVAILABLE THEN WITH SCOTT WEAVER, PAC SAME DAY DR. Tenny Craw IS IN THE OFFICE  NO MEDICATION CHANGES WERE MADE TODAY

## 2012-07-01 ENCOUNTER — Telehealth: Payer: Self-pay | Admitting: *Deleted

## 2012-07-01 NOTE — Telephone Encounter (Signed)
Message copied by Tarri Fuller on Tue Jul 01, 2012 10:32 AM ------      Message from: Scott, [place] T      Created: Mon Jun 30, 2012  4:49 PM       TSH normal ------

## 2012-07-01 NOTE — Telephone Encounter (Signed)
pt notified about normal lab results with verbal understanding today

## 2012-07-07 ENCOUNTER — Ambulatory Visit (HOSPITAL_COMMUNITY): Payer: Medicare Other | Attending: Physician Assistant | Admitting: Radiology

## 2012-07-07 VITALS — BP 126/83 | Ht 71.0 in | Wt 177.0 lb

## 2012-07-07 DIAGNOSIS — R002 Palpitations: Secondary | ICD-10-CM | POA: Insufficient documentation

## 2012-07-07 DIAGNOSIS — I491 Atrial premature depolarization: Secondary | ICD-10-CM

## 2012-07-07 DIAGNOSIS — E785 Hyperlipidemia, unspecified: Secondary | ICD-10-CM | POA: Insufficient documentation

## 2012-07-07 DIAGNOSIS — R5383 Other fatigue: Secondary | ICD-10-CM

## 2012-07-07 DIAGNOSIS — I10 Essential (primary) hypertension: Secondary | ICD-10-CM | POA: Insufficient documentation

## 2012-07-07 DIAGNOSIS — R42 Dizziness and giddiness: Secondary | ICD-10-CM | POA: Insufficient documentation

## 2012-07-07 DIAGNOSIS — Z8249 Family history of ischemic heart disease and other diseases of the circulatory system: Secondary | ICD-10-CM | POA: Insufficient documentation

## 2012-07-07 DIAGNOSIS — R5381 Other malaise: Secondary | ICD-10-CM | POA: Insufficient documentation

## 2012-07-07 DIAGNOSIS — I4949 Other premature depolarization: Secondary | ICD-10-CM

## 2012-07-07 MED ORDER — TECHNETIUM TC 99M SESTAMIBI GENERIC - CARDIOLITE
30.0000 | Freq: Once | INTRAVENOUS | Status: AC | PRN
  Administered 2012-07-07: 30 via INTRAVENOUS

## 2012-07-07 MED ORDER — TECHNETIUM TC 99M SESTAMIBI GENERIC - CARDIOLITE
10.0000 | Freq: Once | INTRAVENOUS | Status: AC | PRN
  Administered 2012-07-07: 10 via INTRAVENOUS

## 2012-07-07 NOTE — Progress Notes (Signed)
[facility] SITE 3 NUCLEAR MED [address] [place], [place] [address] [phone]    Cardiology Nuclear Med Study  Henry Adams is a 72 y.o. male     MRN : [id number]     DOB: 06-03-1940  Procedure Date: 07/07/2012  Nuclear Med Background Indication for Stress Test:  Evaluation for Ischemia History:  '02 GXT Cardiac Risk Factors: Family History - CAD, Hypertension and Lipids  Symptoms:  Dizziness, Fatigue and Palpitations   Nuclear Pre-Procedure Caffeine/Decaff Intake:  None NPO After: 8:00pm   Lungs:  clear O2 Sat: 98% on room air. IV 0.9% NS with Angio Cath:  20g  IV Site: R Antecubital  IV Started by:  Stanton Kidney, EMT-P  Chest Size (in):  44 Cup Size: n/a  Height: 5\' 11"  (1.803 m)  Weight:  177 lb (80.287 kg)  BMI:  Body mass index is 24.7 kg/(m^2). Tech Comments:  Meds were taken as directed, per patient.    Nuclear Med Study 1 or 2 day study: 1 day  Stress Test Type:  Stress  Reading MD: Marca Ancona, MD  Order Authorizing Provider:  P.Ross MD  Resting Radionuclide: Technetium 56m Sestamibi  Resting Radionuclide Dose: 10.8 mCi   Stress Radionuclide:  Technetium 77m Sestamibi  Stress Radionuclide Dose: 33.0 mCi           Stress Protocol Rest HR: 53 Stress HR: 137  Rest BP: 126/83 Stress BP: 180/82  Exercise Time (min): 7:44 METS: 9.60   Predicted Max HR: 146 bpm % Max HR: 93.84 bpm Rate Pressure Product: 66440   Dose of Adenosine (mg):  n/a Dose of Lexiscan: n/a mg  Dose of Atropine (mg): n/a Dose of Dobutamine: n/a mcg/kg/min (at max HR)  Stress Test Technologist: Milana Na, EMT-P  Nuclear Technologist:  Doyne Keel, CNMT     Rest Procedure:  Myocardial perfusion imaging was performed at rest 45 minutes following the intravenous administration of Technetium 71m Sestamibi. Rest ECG: NSR - Normal EKG  Stress Procedure:  The patient exercised on the treadmill utilizing the Bruce Protocol for 7:44 minutes. The patient stopped due to  fatigue, pvcs, pacs,  and denied any chest pain.  Technetium 47m Sestamibi was injected at peak exercise and myocardial perfusion imaging was performed after a brief delay. Stress ECG: No significant change from baseline ECG  PVCs in recovery.   QPS Raw Data Images:  Prominent subdiaphragmatic uptake near the inferior wall at rest but not with stress.  Stress Images:  Medium-sized, mild inferior perfusion defect.  Rest Images:  Medium-sized, mild inferior perfusion defect less prominent than with stress.  Subtraction (SDS):  Partially reversible medium-sized, mild inferior perfusion defect.  Transient Ischemic Dilatation (Normal <1.22):  0.98 Lung/Heart Ratio (Normal <0.45):  0.30  Quantitative Gated Spect Images QGS EDV:  108 ml QGS ESV:  40 ml  Impression Exercise Capacity:  Fair exercise capacity. BP Response:  Normal blood pressure response. Clinical Symptoms:  Fatigue, no chest pain.  ECG Impression:  No significant ST segment change suggestive of ischemia.  PVCs in recovery.  Comparison with Prior Nuclear Study: No images to compare  Overall Impression:  Probably low risk study.  There is a medium-sized, mild partially reversible inferior perfusion defect. Normal inferior wall motion. Cannot rule out ischemia but there was prominent gut uptake adjacent to the inferior wall on rest images and not on stress images.  This may have added counts to the inferior wall at rest.  Very possible that this  defect is attenuation.   LV Ejection Fraction: 63%.  LV Wall Motion:  NL LV Function; NL Wall Motion  Marca Ancona 07/07/2012

## 2012-07-08 ENCOUNTER — Encounter: Payer: Self-pay | Admitting: Physician Assistant

## 2012-07-11 ENCOUNTER — Telehealth: Payer: Self-pay | Admitting: *Deleted

## 2012-07-11 NOTE — Telephone Encounter (Signed)
Message copied by Tarri Fuller on Fri Jul 11, 2012  1:48 PM ------      Message from: [place], [place] T      Created: Thu Jul 10, 2012  8:32 AM       Reviewed stress test with Dr. Dietrich Pates       There is a lot of interference from his gut on scan.  This makes study hard to read.      With his symptoms and what we can see on his scan, Dr. Tenny Craw felt like we should consider proceeding with cardiac cath to further evaluate.      Please schedule with her or me on day she is here when she is back in office (week after next?) to discuss.      Please let me speak to patient when you get him on phone.      Tereso Newcomer, PA-C        07/10/2012 8:31 AM ------

## 2012-07-11 NOTE — Telephone Encounter (Signed)
lmptcb to go over myoview. Per Bing Neighbors and Dr. Tenny Craw pt is now scheduled for 7/11 10:10 w/SW, PAC. Advised ptcb, Scott Will s/w pt on monday per [employer].

## 2012-07-14 NOTE — Telephone Encounter (Signed)
lmptcb x 2 to go over [employer] and that [employer]. PA would like to s/w him as well about results. I will try again in the morning

## 2012-07-17 ENCOUNTER — Encounter: Payer: Self-pay | Admitting: *Deleted

## 2012-07-17 NOTE — Telephone Encounter (Signed)
Follow up  Pt is returning your call. He said he has been out of town and just got back today and he was sorry for missing your calls.

## 2012-07-17 NOTE — Telephone Encounter (Signed)
lmptcb x 4 now to go over stress test results and recommendations from Dr. Ross and Scott Weaver, PAC; see recommendations on myoview results. I will send out a letter today as well to the pt. 

## 2012-07-17 NOTE — Telephone Encounter (Signed)
lmptcb x 4 now to go over stress test results and recommendations from Dr. Tenny Craw and Tereso Newcomer, [facility]; see recommendations on myoview results. I will send out a letter today as well to the pt.

## 2012-07-17 NOTE — Telephone Encounter (Signed)
pt notified about stress test results and the need for f/u appt to discuss cardiac cath per Dr. Tenny Craw. appt scheduled for 7/11 w/Scott Alben Spittle, [facility] same day Dr. Tenny Craw is in the office. pt said ok and thank you.

## 2012-08-01 ENCOUNTER — Encounter: Payer: Self-pay | Admitting: *Deleted

## 2012-08-01 ENCOUNTER — Encounter: Payer: Self-pay | Admitting: Physician Assistant

## 2012-08-01 ENCOUNTER — Ambulatory Visit (INDEPENDENT_AMBULATORY_CARE_PROVIDER_SITE_OTHER): Payer: Medicare Other | Admitting: Physician Assistant

## 2012-08-01 VITALS — BP 110/70 | HR 79 | Ht 71.0 in | Wt 175.0 lb

## 2012-08-01 DIAGNOSIS — I10 Essential (primary) hypertension: Secondary | ICD-10-CM

## 2012-08-01 DIAGNOSIS — R9439 Abnormal result of other cardiovascular function study: Secondary | ICD-10-CM

## 2012-08-01 DIAGNOSIS — N183 Chronic kidney disease, stage 3 unspecified: Secondary | ICD-10-CM

## 2012-08-01 DIAGNOSIS — Z01810 Encounter for preprocedural cardiovascular examination: Secondary | ICD-10-CM

## 2012-08-01 LAB — CBC WITH DIFFERENTIAL/PLATELET
Basophils Relative: 0.6 % (ref 0.0–3.0)
Eosinophils Absolute: 0.3 10*3/uL (ref 0.0–0.7)
HCT: 41.5 % (ref 39.0–52.0)
Lymphs Abs: 1.8 10*3/uL (ref 0.7–4.0)
MCHC: 33.6 g/dL (ref 30.0–36.0)
MCV: 93.5 fl (ref 78.0–100.0)
Monocytes Absolute: 0.6 10*3/uL (ref 0.1–1.0)
Neutrophils Relative %: 47.2 % (ref 43.0–77.0)
RBC: 4.44 Mil/uL (ref 4.22–5.81)

## 2012-08-01 LAB — BASIC METABOLIC PANEL
BUN: 27 mg/dL — ABNORMAL HIGH (ref 6–23)
Calcium: 9 mg/dL (ref 8.4–10.5)
Creatinine, Ser: 1.5 mg/dL (ref 0.4–1.5)
GFR: 47.11 mL/min — ABNORMAL LOW (ref >60.00)
Potassium: 4.4 mEq/L (ref 3.5–5.1)

## 2012-08-01 LAB — PROTIME-INR: Prothrombin Time: 11.6 s (ref 10.2–12.4)

## 2012-08-01 NOTE — Patient Instructions (Addendum)
Your physician has requested that you have a cardiac catheterization. Cardiac catheterization is used to diagnose and/or treat various heart conditions. Doctors may recommend this procedure for a number of different reasons. The most common reason is to evaluate chest pain. Chest pain can be a symptom of coronary artery disease (CAD), and cardiac catheterization can show whether plaque is narrowing or blocking your heart's arteries. This procedure is also used to evaluate the valves, as well as measure the blood flow and oxygen levels in different parts of your heart. For further information please visit [email or web address] Please follow instruction sheet, as given. Your physician recommends that you continue on your current medications as directed. Please refer to the Current Medication list given to you today.  Your physician recommends that you return for lab work in: BMET  CBC WITH DIFF INR  DX V72.81

## 2012-08-01 NOTE — Progress Notes (Signed)
[address] [place], [place]  [address] Phone: [phone] Fax:  [phone]  Date:  08/01/2012   ID:  Henry Adams, Henry Adams April 11, 1940, MRN [id number]  PCP:  Marga Melnick, MD  Cardiologist:  Dr. Dietrich Pates     History of Present Illness: Henry Adams is a 72 y.o. male who returns for the evaluation of exertional fatigue at the request of his nephrologist (Dr. Arrie Aran).  He has a hx of CKD 2/2 polycystic kidney disease, HTN, HL.  He was seen by Dr. Dietrich Pates in 12/2009 after a visit to the ED for chest pain.  No further intervention was recommended.     I saw him recently for symptoms of fatigue and exhaustion.  TSH was normal.  Myoview was difficult study to read and was reviewed by Dr. Dietrich Pates.  Decision was made to possibly pursue cardiac cath to better evaluate symptoms.    ETT- Myoview 07/08/12:  Probably low risk study. There is a medium-sized, mild partially reversible inferior perfusion defect. Normal inferior wall motion. Cannot rule out ischemia but there was prominent gut uptake adjacent to the inferior wall on rest images and not on stress images. This may have added counts to the inferior wall at rest. Very possible that this defect is attenuation.  LV Ejection Fraction: 63%. LV Wall Motion: NL LV Function; NL Wall Motion   Since last seen, he continues to note fatigue.  No changes.  No worsening.  No CP, SOB, syncope, orthopnea, PND, edema.    Labs (6/13):  K 3.9, Cr 1.5, ALT 16, LDL 131.7, Hgb 13.8, TSH 3.13 Labs (4/14):  K 4.4, Cr 1.48, Hgb 13.6 Labs (6/14):  TSH 1.15  Wt Readings from Last 3 Encounters:  07/07/12 177 lb (80.287 kg)  06/30/12 177 lb 6.4 oz (80.468 kg)  07/02/11 182 lb (82.555 kg)     Past Medical History  Diagnosis Date  . Hypertension   . GERD (gastroesophageal reflux disease)   . Allergy     perennial  . CKD (chronic kidney disease)   . Polycystic kidney disease   . HLD (hyperlipidemia)   . Hx of cardiovascular  stress test     ETT-Myoview 6/14: probable low risk study, inf defect with normal inf wall motion-cannot r/o ischemia; prominent gut uptake adjacent to inf wall on rest images-poss defect is atten, EF 63%    Current Outpatient Prescriptions  Medication Sig Dispense Refill  . amLODipine (NORVASC) 5 MG tablet Take 5 mg by mouth daily.        [name] aspirin 81 MG tablet Take 81 mg by mouth daily.        [name] azelastine (OPTIVAR) 0.05 % ophthalmic solution 2 drops as needed.       . fexofenadine (ALLEGRA) 180 MG tablet Take 180 mg by mouth daily.        . hydrochlorothiazide (HYDRODIURIL) 25 MG tablet Take 12.5 mg by mouth daily.      . meclizine (ANTIVERT) 12.5 MG tablet Take 12.5 mg by mouth as needed.        [name] omeprazole (PRILOSEC) 20 MG capsule Take 20 mg by mouth daily.       [name] telmisartan (MICARDIS) 40 MG tablet Take 40 mg by mouth daily.        . vitamin C (ASCORBIC ACID) 500 MG tablet Take 500 mg by mouth daily.        [name] azelastine (ASTELIN) 137 MCG/SPRAY nasal spray Place 1  spray into the nose 2 (two) times daily. Use in each nostril as directed        No current facility-administered medications for this visit.    Allergies:    Allergies  Allergen Reactions  . Iodine     rash  . Diazepam     Mental status changes   . Oxycodone     Mental status changes & nausea    Social History:  The patient  reports that he has never smoked. He does not have any smokeless tobacco history on file. He reports that  drinks alcohol. He reports that he does not use illicit drugs.    ROS:  Please see the history of present illness.      All other systems reviewed and negative.   PHYSICAL EXAM: VS:  BP 110/70  Pulse 79  Ht 5\' 11"  (1.803 m)  Wt 175 lb (79.379 kg)  BMI 24.42 kg/m2  SpO2 96% Well nourished, well developed, in no acute distress HEENT: normal Neck: no JVD Cardiac:  normal S1, S2; RRR; no murmur Lungs:  clear to auscultation bilaterally, no wheezing, rhonchi or rales Abd: soft,  nontender, no hepatomegaly Ext: no edema Skin: warm and dry Neuro:  CNs 2-12 intact, no focal abnormalities noted   ASSESSMENT AND PLAN:  1. Exhaustion:  Myoview is abnormal and inferior ischemia cannot be ruled out.  Reviewed with Dr. Dietrich Pates.  We think it is best to proceed with cardiac cath.  Risks and benefits of cardiac catheterization have been discussed with the patient.  These include bleeding, infection, kidney damage, stroke, heart attack, death.  The patient understands these risks and is willing to proceed.  Will use minimal dye and try to bring in early for pre-hydration.  He has an allergy to topical iodine (when he was a kid).  Will review with interventional docs to see if we need to pre-medicate.   2. Hypertension:  Controlled.  Continue current therapy.  3. CKD:  Use minimal dye as noted and pre-hydrate.   4. Disposition:  F/u with Dr. Dietrich Pates in August.   Signed, Tereso Newcomer, PA-C  08/01/2012 10:25 AM    Addendum 08/01/2012 2:35 PM: Patient notes rash with topical iodine as a child.  Reviewed with Dr. Peter [place]. He does NOT need premedication for dye allergy. However, he will need a non-iodine based prep prior to arterial puncture. Signed,  Tereso Newcomer, PA-C   08/01/2012 2:36 PM

## 2012-08-01 NOTE — H&P (Signed)
History and Physical  Date:  08/01/2012   ID:  Henry Adams [birth year], MRN [id number]  PCP:  Marga Melnick, MD  Cardiologist:  Dr. Dietrich Pates     History of Present Illness: [name] is a 72 y.o. male who returns for the evaluation of exertional fatigue at the request of his nephrologist (Dr. Arrie Aran).  He has a hx of CKD 2/2 polycystic kidney disease, HTN, HL.  He was seen by Dr. Dietrich Pates in 12/2009 after a visit to the ED for chest pain.  No further intervention was recommended.     I saw him recently for symptoms of fatigue and exhaustion.  TSH was normal.  Myoview was difficult study to read and was reviewed by Dr. Dietrich Pates.  Decision was made to possibly pursue cardiac cath to better evaluate symptoms.    ETT- Myoview 07/08/12:  Probably low risk study. There is a medium-sized, mild partially reversible inferior perfusion defect. Normal inferior wall motion. Cannot rule out ischemia but there was prominent gut uptake adjacent to the inferior wall on rest images and not on stress images. This may have added counts to the inferior wall at rest. Very possible that this defect is attenuation.  LV Ejection Fraction: 63%. LV Wall Motion: NL LV Function; NL Wall Motion   Since last seen, he continues to note fatigue.  No changes.  No worsening.  No CP, SOB, syncope, orthopnea, PND, edema.    Labs (6/13):  K 3.9, Cr 1.5, ALT 16, LDL 131.7, Hgb 13.8, TSH 3.13 Labs (4/14):  K 4.4, Cr 1.48, Hgb 13.6 Labs (6/14):  TSH 1.15  Wt Readings from Last 3 Encounters:  07/07/12 177 lb (80.287 kg)  06/30/12 177 lb 6.4 oz (80.468 kg)  07/02/11 182 lb (82.555 kg)     Past Medical History  Diagnosis Date  . Hypertension   . GERD (gastroesophageal reflux disease)   . Allergy     perennial  . CKD (chronic kidney disease)   . Polycystic kidney disease   . HLD (hyperlipidemia)   . Hx of cardiovascular stress test     ETT-Myoview 6/14: probable low risk study, inf defect with normal inf  wall motion-cannot r/o ischemia; prominent gut uptake adjacent to inf wall on rest images-poss defect is atten, EF 63%    Current Outpatient Prescriptions  Medication Sig Dispense Refill  . amLODipine (NORVASC) 5 MG tablet Take 5 mg by mouth daily.        [name] aspirin 81 MG tablet Take 81 mg by mouth daily.        [name] azelastine (OPTIVAR) 0.05 % ophthalmic solution 2 drops as needed.       . fexofenadine (ALLEGRA) 180 MG tablet Take 180 mg by mouth daily.        . hydrochlorothiazide (HYDRODIURIL) 25 MG tablet Take 12.5 mg by mouth daily.      . meclizine (ANTIVERT) 12.5 MG tablet Take 12.5 mg by mouth as needed.        [name] omeprazole (PRILOSEC) 20 MG capsule Take 20 mg by mouth daily.       [name] telmisartan (MICARDIS) 40 MG tablet Take 40 mg by mouth daily.        . vitamin C (ASCORBIC ACID) 500 MG tablet Take 500 mg by mouth daily.        [name] azelastine (ASTELIN) 137 MCG/SPRAY nasal spray Place 1 spray into the nose 2 (two) times daily. Use in each nostril as directed  No current facility-administered medications for this visit.    Allergies:    Allergies  Allergen Reactions  . Iodine     rash  . Diazepam     Mental status changes   . Oxycodone     Mental status changes & nausea    Social History:  The patient  reports that he has never smoked. He does not have any smokeless tobacco history on file. He reports that  drinks alcohol. He reports that he does not use illicit drugs.   Family History:  The patient's family history includes Cancer in his daughter and sister; Heart disease in his brother; and Hypertension in his father.   ROS:  Please see the history of present illness.      All other systems reviewed and negative.   PHYSICAL EXAM: VS:  BP 110/70  Pulse 79  Ht 5\' 11"  (1.803 m)  Wt 175 lb (79.379 kg)  BMI 24.42 kg/m2  SpO2 96% Well nourished, well developed, in no acute distress HEENT: normal Neck: no JVD Cardiac:  normal S1, S2; RRR; no murmur Lungs:  clear to  auscultation bilaterally, no wheezing, rhonchi or rales Abd: soft, nontender, no hepatomegaly Ext: no edema Skin: warm and dry Neuro:  CNs 2-12 intact, no focal abnormalities noted   ASSESSMENT AND PLAN:  1. Exhaustion:  Myoview is abnormal and inferior ischemia cannot be ruled out.  Reviewed with Dr. Dietrich Pates.  We think it is best to proceed with cardiac cath.  Risks and benefits of cardiac catheterization have been discussed with the patient.  These include bleeding, infection, kidney damage, stroke, heart attack, death.  The patient understands these risks and is willing to proceed.  Will use minimal dye and try to bring in early for pre-hydration.  He has an allergy to topical iodine (when he was a kid).  Will review with interventional docs to see if we need to pre-medicate.   2. Hypertension:  Controlled.  Continue current therapy.  3. CKD:  Use minimal dye as noted and pre-hydrate.   4. Disposition:  F/u with Dr. Dietrich Pates in August.   Signed, Tereso Newcomer, PA-C  08/01/2012 10:25 AM    Addendum 08/01/2012 2:35 PM: Patient notes rash with topical iodine as a child.  Reviewed with Dr. Peter [place]. He does NOT need premedication for dye allergy. However, he will need a non-iodine based prep prior to arterial puncture. Signed,  Tereso Newcomer, PA-C   08/01/2012 2:36 PM

## 2012-08-07 ENCOUNTER — Telehealth: Payer: Self-pay | Admitting: Cardiovascular Disease

## 2012-08-07 NOTE — Telephone Encounter (Signed)
New Prob      Pt has some questions regarding procedure on 7/23. Please call.

## 2012-08-07 NOTE — Telephone Encounter (Signed)
Spoke with patient who states he has figured out what he called about - that the call to him was from [facility] pre-registration but he did not know that at the time he called here.  Patient denies questions regarding procedure and thanked me for calling him.

## 2012-08-13 ENCOUNTER — Inpatient Hospital Stay (HOSPITAL_BASED_OUTPATIENT_CLINIC_OR_DEPARTMENT_OTHER)
Admission: RE | Admit: 2012-08-13 | Discharge: 2012-08-13 | Disposition: A | Discharge status: Home / Self Care | Payer: Medicare Other | Source: Ambulatory Visit | Attending: Cardiovascular Disease | Admitting: Cardiovascular Disease

## 2012-08-13 ENCOUNTER — Encounter (HOSPITAL_BASED_OUTPATIENT_CLINIC_OR_DEPARTMENT_OTHER)
Admission: RE | Disposition: A | Discharge status: Home / Self Care | Payer: Self-pay | Source: Ambulatory Visit | Attending: Cardiovascular Disease

## 2012-08-13 DIAGNOSIS — R079 Chest pain, unspecified: Secondary | ICD-10-CM | POA: Insufficient documentation

## 2012-08-13 DIAGNOSIS — Z01810 Encounter for preprocedural cardiovascular examination: Secondary | ICD-10-CM

## 2012-08-13 DIAGNOSIS — R5381 Other malaise: Secondary | ICD-10-CM | POA: Insufficient documentation

## 2012-08-13 DIAGNOSIS — I2510 Atherosclerotic heart disease of native coronary artery without angina pectoris: Secondary | ICD-10-CM

## 2012-08-13 DIAGNOSIS — N182 Chronic kidney disease, stage 2 (mild): Secondary | ICD-10-CM | POA: Insufficient documentation

## 2012-08-13 DIAGNOSIS — I129 Hypertensive chronic kidney disease with stage 1 through stage 4 chronic kidney disease, or unspecified chronic kidney disease: Secondary | ICD-10-CM | POA: Insufficient documentation

## 2012-08-13 DIAGNOSIS — R9439 Abnormal result of other cardiovascular function study: Secondary | ICD-10-CM | POA: Insufficient documentation

## 2012-08-13 SURGERY — JV LEFT HEART CATHETERIZATION WITH CORONARY ANGIOGRAM

## 2012-08-13 MED ORDER — ASPIRIN EC 325 MG PO TBEC: 325.0000 mg | DELAYED_RELEASE_TABLET | Freq: Every day | ORAL | Status: DC

## 2012-08-13 MED ORDER — SODIUM CHLORIDE 0.9 % IJ SOLN: 3.0000 mL | Freq: Two times a day (BID) | INTRAMUSCULAR | Status: DC

## 2012-08-13 MED ORDER — SODIUM CHLORIDE 0.9 % IV SOLN: 250.0000 mL | INTRAVENOUS | Status: DC | PRN

## 2012-08-13 MED ORDER — SODIUM CHLORIDE 0.9 % IV SOLN: INTRAVENOUS | Status: DC

## 2012-08-13 MED ORDER — SODIUM CHLORIDE 0.45 % IV SOLN: INTRAVENOUS | Status: DC

## 2012-08-13 MED ORDER — SODIUM CHLORIDE 0.9 % IJ SOLN: 3.0000 mL | INTRAMUSCULAR | Status: DC | PRN

## 2012-08-13 MED ORDER — ASPIRIN 81 MG PO CHEW
324.0000 mg | CHEWABLE_TABLET | ORAL | Status: AC
  Administered 2012-08-13: 243 mg via ORAL

## 2012-08-13 MED ORDER — ACETAMINOPHEN 325 MG PO TABS: 650.0000 mg | ORAL_TABLET | ORAL | Status: DC | PRN

## 2012-08-13 MED ORDER — ONDANSETRON HCL 4 MG/2ML IJ SOLN: 4.0000 mg | Freq: Four times a day (QID) | INTRAMUSCULAR | Status: DC | PRN

## 2012-08-13 NOTE — Interval H&P Note (Signed)
Cath Lab Visit (complete for each Cath Lab visit)  Clinical Evaluation Leading to the Procedure:   ACS: no  Non-ACS:    Anginal Classification: CCS I  Anti-ischemic medical therapy: Minimal Therapy (1 class of medications)  Non-Invasive Test Results: Low-risk stress test findings: cardiac mortality <1%/year  Prior CABG: No previous CABG      History and Physical Interval Note:  08/13/2012 9:25 AM  Henry Adams  has presented today for surgery, with the diagnosis of cp  The various methods of treatment have been discussed with the patient and family. After consideration of risks, benefits and other options for treatment, the patient has consented to  Procedure(s): JV LEFT HEART CATHETERIZATION WITH CORONARY ANGIOGRAM (N/A) as a surgical intervention .  The patient's history has been reviewed, patient examined, no change in status, stable for surgery.  I have reviewed the patient's chart and labs.  Questions were answered to the patient's satisfaction.     Charlton Haws

## 2012-08-13 NOTE — OR Nursing (Signed)
Meal served 

## 2012-08-13 NOTE — OR Nursing (Signed)
TR band removed, site level 0, pressure dressing and wrist splint applied, distal circulation intact, discharge instructions reviewed and signed, pt stated understanding, ambulated in hall without difficulty, transported to wife's car via wheelchair 

## 2012-08-13 NOTE — CV Procedure (Signed)
Cardiac Catheterization Procedure Note  Name: AADON GORELIK MRN: [id number] DOB: May 18, 1940  Procedure: Left Heart Cath, Selective Coronary Angiography, LV angiography  Indication:  Chest pain abnormal myovue   Procedural Details: The right wrist was prepped, draped, and anesthetized with 1% lidocaine. Using the modified Seldinger technique, a 5 French sheath was introduced into the right radial artery. 3 mg of verapamil was administered through the sheath, weight-based unfractionated heparin was administered intravenously. Standard Judkins catheters were used for selective coronary angiography and left ventriculography. Catheter exchanges were performed over an exchange length guidewire. There were no immediate procedural complications. A TR band was used for radial hemostasis at the completion of the procedure.  The patient was transferred to the post catheterization recovery area for further monitoring.  Coronary Arteries: Right dominant with no anomalies  LM: 20% distal  LAD: 30% mid and distal diseae Somewhat diminutive vessel compared to RCA and Circumfles  D1- Small artery no critical disease  D2- Small artery no critical disease  D3- Small artery no critical disease  D4:- Small artery no critical disease   Circumflex: 20% mid vessel disease   OM1- Very large artery small inferior branch with 60% ostial diseasse  RCA: large vessel normal   PDA- normal  PLB- 30-40% mid vessel disease large system with 4 branches  Ventriculography: EF: 70% %, hyperdynamic with no RWMA;s  Hemodynamics:  Aortic Pressure: 132 67  mmHg  LV Pressure: 130 8  mmHg   Final Conclusions:  No significant CAD and normal LV function  Recommendations: ASA f/u primary in regard to fatigue  Charlton Haws 08/13/2012, 10:26 AM

## 2012-08-21 ENCOUNTER — Encounter: Payer: Self-pay | Admitting: Internal Medicine

## 2012-08-26 ENCOUNTER — Ambulatory Visit: Payer: Medicare Other | Admitting: Internal Medicine

## 2012-08-27 ENCOUNTER — Other Ambulatory Visit: Payer: Self-pay

## 2012-08-29 ENCOUNTER — Telehealth: Payer: Self-pay | Admitting: *Deleted

## 2012-08-29 NOTE — Telephone Encounter (Signed)
Pt called and stated that he received a letter in the mail concerning a diabetic follow. Pt states he wasn't aware he was diagnosed with diabetes. Would like for someone to call and explain the situation to him. //SW//CMA     Called patient in regards to the letter. Explain to the patient that the letter was in reference to the fact that he is due for his pneumococcal vaccine. Pt stated he will call in a schedule that appointment in near future. //SW//CMA

## 2012-08-29 NOTE — Progress Notes (Signed)
Spoke with pt who is aware to follow up with his PCP for fatigue

## 2012-09-01 ENCOUNTER — Ambulatory Visit: Payer: Medicare Other | Admitting: Internal Medicine

## 2012-10-10 ENCOUNTER — Encounter: Payer: Self-pay | Admitting: *Deleted

## 2012-10-15 ENCOUNTER — Ambulatory Visit (INDEPENDENT_AMBULATORY_CARE_PROVIDER_SITE_OTHER): Payer: Medicare Other | Admitting: Internal Medicine

## 2012-10-15 ENCOUNTER — Encounter: Payer: Self-pay | Admitting: Internal Medicine

## 2012-10-15 VITALS — BP 137/78 | HR 64 | Temp 97.8°F | Wt 176.0 lb

## 2012-10-15 DIAGNOSIS — H10021 Other mucopurulent conjunctivitis, right eye: Secondary | ICD-10-CM | POA: Insufficient documentation

## 2012-10-15 DIAGNOSIS — Z23 Encounter for immunization: Secondary | ICD-10-CM

## 2012-10-15 DIAGNOSIS — H10029 Other mucopurulent conjunctivitis, unspecified eye: Secondary | ICD-10-CM

## 2012-10-15 DIAGNOSIS — I10 Essential (primary) hypertension: Secondary | ICD-10-CM

## 2012-10-15 MED ORDER — GENTAMICIN SULFATE 0.3 % OP SOLN: 2.0000 [drp] | Freq: Four times a day (QID) | OPHTHALMIC | Status: DC

## 2012-10-15 NOTE — Patient Instructions (Signed)
Use the eyedrops as prescribed for one week Please see her eye doctor this week Call anytime if the symptoms are worse. I recommend you have high dose flu  shot, you can get it at your pharmacy or here in the office (we are getting it in 2-3 weeks)

## 2012-10-15 NOTE — Progress Notes (Signed)
  Subjective:    Patient ID: Henry Adams, male    DOB: Jun 01, 1940, 71 y.o.   MRN: [id number]  HPI Acute visit Woke up 2days ago w/ a pink right eye, only in the nasal side of the eye Would like to get update on some his immunizations.  Past Medical History  Diagnosis Date  . Hypertension   . GERD (gastroesophageal reflux disease)   . Allergy     perennial  . CKD (chronic kidney disease)   . Polycystic kidney disease   . HLD (hyperlipidemia)   . Hx of cardiovascular stress test     ETT-Myoview 6/14: probable low risk study, inf defect with normal inf wall motion-cannot r/o ischemia; prominent gut uptake adjacent to inf wall on rest images-poss defect is atten, EF 63%   Past Surgical History  Procedure Laterality Date  . Tonsillectomy and adenoidectomy    . Hernia repair      umbilical  . Colonoscopy  2011    diverticulosis      Review of Systems Denies visual disturbances. No pain, mild eye itching . No  eye discharge or tearing. No history of eye surgeries, he use to do have contacts but quit using them ~ a year ago.     Objective:   Physical Exam  Constitutional: He appears well-developed and well-nourished. No distress.  HENT:  Head: Normocephalic and atraumatic.  Eyes: Pupils are equal, round, and reactive to light.    Skin: He is not diaphoretic.  No photophobia       Assessment & Plan:  immunizations: Pneumonia shot provided, recommend to get a flu shot, other immunizations at a later time, he didn't like to get more than one shot today.

## 2012-10-15 NOTE — Assessment & Plan Note (Signed)
Right eye slightly red, conjunctivitis?. Recommend to see his eye doctor, start empiric antibiotics.

## 2012-10-15 NOTE — Assessment & Plan Note (Signed)
Reports his nephrologist discontinue amlodipine, currently on Micardis and HCTZ

## 2012-10-16 ENCOUNTER — Ambulatory Visit (INDEPENDENT_AMBULATORY_CARE_PROVIDER_SITE_OTHER): Payer: Medicare Other | Admitting: Internal Medicine

## 2012-10-16 ENCOUNTER — Encounter: Payer: Self-pay | Admitting: Internal Medicine

## 2012-10-16 VITALS — BP 110/70 | HR 75 | Wt 175.0 lb

## 2012-10-16 DIAGNOSIS — E7800 Pure hypercholesterolemia, unspecified: Secondary | ICD-10-CM

## 2012-10-16 MED ORDER — ATORVASTATIN CALCIUM 20 MG PO TABS: 20.0000 mg | ORAL_TABLET | Freq: Every day | ORAL | Status: DC

## 2012-10-16 NOTE — Patient Instructions (Signed)
START LIPITOR 20 MG DAILY, RX SENT TO [place]   Return in late November or early December for fasting lp/ast  Your physician wants you to follow-up in: 1 YEAR You will receive a reminder letter in the mail two months in advance. If you don't receive a letter, please call our office to schedule the follow-up appointment.

## 2012-10-16 NOTE — Progress Notes (Signed)
HPI Patient is a 72 yo with a history of CRI, HTN, HL  He as last seen by Wende Mott.  Had stress test this summer that had question of infeiror ischemia He went on for L heart cath  This showed mild CAD.  LVEF 71% Since procedure he has done well  No CP  No SOB     Wt Readings from Last 3 Encounters:  10/16/12 175 lb (79.379 kg)  10/15/12 176 lb (79.833 kg)  08/13/12 175 lb (79.379 kg)     Past Medical History  Diagnosis Date  . Hypertension   . GERD (gastroesophageal reflux disease)   . Allergy     perennial  . CKD (chronic kidney disease)   . Polycystic kidney disease   . HLD (hyperlipidemia)   . Hx of cardiovascular stress test     ETT-Myoview 6/14: probable low risk study, inf defect with normal inf wall motion-cannot r/o ischemia; prominent gut uptake adjacent to inf wall on rest images-poss defect is atten, EF 63%    Current Outpatient Prescriptions  Medication Sig Dispense Refill  . aspirin 81 MG tablet Take 81 mg by mouth daily.        [name] azelastine (ASTELIN) 137 MCG/SPRAY nasal spray Place 2 sprays into the nose 2 (two) times daily. Use in each nostril as directed      . azelastine (OPTIVAR) 0.05 % ophthalmic solution 2 drops as needed.       . fexofenadine (ALLEGRA) 180 MG tablet Take 180 mg by mouth daily.        [name] gentamicin (GARAMYCIN) 0.3 % ophthalmic solution Place 2 drops into the right eye 4 (four) times daily.  5 mL  0  . hydrochlorothiazide (HYDRODIURIL) 25 MG tablet Take 12.5 mg by mouth daily.      . meclizine (ANTIVERT) 12.5 MG tablet Take 12.5 mg by mouth as needed.        [name] omeprazole (PRILOSEC) 40 MG capsule Take 40 mg by mouth daily.      [name] telmisartan (MICARDIS) 40 MG tablet Take 40 mg by mouth daily.        . vitamin C (ASCORBIC ACID) 500 MG tablet Take 500 mg by mouth daily.         No current facility-administered medications for this visit.    Allergies:    Allergies  Allergen Reactions  . Iodine     rash  . Diazepam     Mental  status changes   . Oxycodone     Mental status changes & nausea    Social History:  The patient  reports that he has never smoked. He does not have any smokeless tobacco history on file. He reports that  drinks alcohol. He reports that he does not use illicit drugs.    ROS:  Please see the history of present illness.      All other systems reviewed and negative.   PHYSICAL EXAM: VS:  BP 110/70  Pulse 75  Wt 175 lb (79.379 kg)  BMI 24.42 kg/m2 Well nourished, well developed, in no acute distress HEENT: normal Neck: no JVD Cardiac:  normal S1, S2; RRR; no murmur Lungs:  clear to auscultation bilaterally, no wheezing, rhonchi or rales Abd: soft, nontender, no hepatomegaly Ext: no edema Skin: warm and dry Neuro:  CNs 2-12 intact, no focal abnormalities noted   ASSESSMENT AND PLAN:  1. CAD  MIld   2.  HTN  Follow  Good control  3.  HL  Will need to get tighter control  Start on lipitor 20 mg  F/U lipids in 8 wks  Set up f/u in 1 year.

## 2012-11-25 ENCOUNTER — Ambulatory Visit: Payer: Medicare Other

## 2012-11-26 ENCOUNTER — Ambulatory Visit (INDEPENDENT_AMBULATORY_CARE_PROVIDER_SITE_OTHER): Payer: Medicare Other

## 2012-11-26 DIAGNOSIS — Z23 Encounter for immunization: Secondary | ICD-10-CM

## 2012-12-15 ENCOUNTER — Other Ambulatory Visit: Payer: Self-pay | Admitting: *Deleted

## 2012-12-15 ENCOUNTER — Other Ambulatory Visit (INDEPENDENT_AMBULATORY_CARE_PROVIDER_SITE_OTHER): Payer: Medicare Other

## 2012-12-15 DIAGNOSIS — K5730 Diverticulosis of large intestine without perforation or abscess without bleeding: Secondary | ICD-10-CM

## 2013-05-28 ENCOUNTER — Other Ambulatory Visit: Payer: Self-pay | Admitting: Internal Medicine

## 2013-09-01 DIAGNOSIS — J383 Other diseases of vocal cords: Secondary | ICD-10-CM | POA: Insufficient documentation

## 2013-09-01 DIAGNOSIS — R490 Dysphonia: Secondary | ICD-10-CM | POA: Insufficient documentation

## 2013-10-15 HISTORY — PX: OTHER SURGICAL HISTORY: SHX169

## 2013-11-04 ENCOUNTER — Encounter: Payer: Self-pay | Admitting: Internal Medicine

## 2013-11-04 ENCOUNTER — Ambulatory Visit (INDEPENDENT_AMBULATORY_CARE_PROVIDER_SITE_OTHER): Payer: Medicare Other | Admitting: Internal Medicine

## 2013-11-04 VITALS — BP 110/66 | HR 66 | Temp 97.7°F | Resp 12 | Wt 176.2 lb

## 2013-11-04 DIAGNOSIS — M542 Cervicalgia: Secondary | ICD-10-CM

## 2013-11-04 DIAGNOSIS — J383 Other diseases of vocal cords: Secondary | ICD-10-CM

## 2013-11-04 NOTE — Patient Instructions (Signed)
  Flonase  OR Nasacort AQ 1 spray in each nostril twice a day as needed. Use the "crossover" technique as discussed for frontal sinus congestion. Zicam Melts or Zinc lozenges as per package label for sore throat .   Please do not use Q-tips as we discussed. Should wax build up occur, please put 2-3 drops of mineral oil in the affected  ear at night to soften the wax .Cover the canal with a  cotton ball to prevent the oil from staining bed linens. In the morning fill the ear canal with hydrogen peroxide & lie in the opposite lateral decubitus position(on the side opposite the affected ear)  for 10-15 minutes. After allowing this period of time for the peroxide to dissolve the wax ;shower and use the thinnest washrag available to wick out the wax. If both ears are involved ; alternate this treatment from ear to ear each night until no wax is found on the washrag.

## 2013-11-04 NOTE — Progress Notes (Signed)
   Subjective:    Patient ID: Henry Adams, male    DOB: 1940-12-22, 73 y.o.   MRN: [id number]  HPI  Patient is seen today with complaints for sore throat.    Symptoms started acutely while talking 10/31/13.  They improved but then worsened again on 11/03/13.  Left sided pain greater than right.    He also has frontal sinus pressure.  He has chronic GERD which he takes Prilosec for.  He states his symptoms have been stable.    He is status post medialization laryngoplasty 10/15/13 by Dr Joya Gaskins at [facility].  He has scheduled follow up in 2 weeks.   Review of Systems  He denies fever, chill, cough, post nasal drip, extrinsic symptoms, rhinorrhea, ear pain or ear drainage.       Objective:   Physical Exam        Assessment & Plan:

## 2013-11-04 NOTE — Progress Notes (Signed)
Pre visit review using our clinic review tool, if applicable. No additional management support is needed unless otherwise documented below in the visit note. 

## 2013-11-04 NOTE — Progress Notes (Signed)
   Subjective:    Patient ID: Henry Adams, male    DOB: [birth year], 73 y.o.   MRN: [id number]  HPI   While talking 10/31/13 he experienced acute onset of throat pain. This improved with saline gargles but recurred 11/03/13. There has been pain bilaterally  Intermittently since but greater on the left.  He's had some frontal sinus pressure.  He also has GERD which is asymptomatic on a PPI daily.  He underwent  medialization laryngoplasty 10/15/13 by Dr. Joya Gaskins at [facility] is scheduled in 2 weeks.  He has no extrinsic symptoms or signs/ symptoms of respiratory tract infection.    Review of Systems  Extrinsic symptoms of itchy, watery eyes, sneezing, or angioedema are denied.  There is no significant cough, sputum production, wheezing,or  paroxysmal nocturnal dyspnea. Frontal headache, facial pain , nasal purulence, dental pain, otic pain or otic discharge denied. No fever , chills or sweats.      Objective:   Physical Exam   Positive pertinent findings include: He has bilateral hearing aids. There is increased wax on the right. The nasal septum is deviated to the right. There is minimal tenderness over the anterior neck suture site. There is no evidence of cellulitis. There is no cervical lymphadenopathy. No TMJ tenderness.   General appearance:good health ;well nourished; no acute distress or increased work of breathing is present.    Eyes: No conjunctival inflammation or lid edema is present. There is no scleral icterus. Nose:  External nasal examination shows no deformity or inflammation. Nasal mucosa are dry without lesions or exudates.  Oral exam: Dental hygiene is good; lips and gums are healthy appearing.There is no oropharyngeal erythema or exudate noted.  Neck:  No deformities, thyromegaly, or masses noted.   Supple with full range of motion without pain.  Heart:  Normal rate and regular rhythm. S1 and S2 normal without gallop, murmur, click, rub or other  extra sounds.  Lungs:Chest clear to auscultation; no wheezes, rhonchi,rales ,or rubs present.No increased work of breathing.   Extremities:  No cyanosis, edema, or clubbing  noted  Skin: Warm & dry w/o jaundice or tenting.          Assessment & Plan:  #1 bilateral neck pain in the context of laryngeal surgery 10/21/13. This may be a mechanical-type pain related to some cicatrixing process around the sutures. There is no history or physical finding to suggest active infection  Plan: Symptomatic treatment with Zicam Melts as well as inhaled nasal steroids will be pursued.  A copy this will be provided Dr. Joya Gaskins to see if he wishes to see Mr. Eichhorst earlier than his scheduled appointment in 2 weeks.

## 2013-11-05 ENCOUNTER — Telehealth: Payer: Self-pay

## 2013-11-05 NOTE — Telephone Encounter (Signed)
10.14.15 office note has been faxed

## 2013-11-05 NOTE — Telephone Encounter (Signed)
Message copied by Shelly Coss on Thu Nov 05, 2013  8:03 AM ------      Message from: Hendricks Limes      Created: Wed Nov 04, 2013  6:34 PM       Please FAX office visit to Dr Pricilla Riffle ,[place]      Thanks, Linus Orn       ------

## 2013-11-06 ENCOUNTER — Other Ambulatory Visit: Payer: Self-pay

## 2014-07-19 ENCOUNTER — Other Ambulatory Visit: Payer: Self-pay

## 2014-07-23 ENCOUNTER — Ambulatory Visit (INDEPENDENT_AMBULATORY_CARE_PROVIDER_SITE_OTHER): Payer: Medicare Other | Admitting: Internal Medicine

## 2014-07-23 ENCOUNTER — Encounter: Payer: Self-pay | Admitting: Internal Medicine

## 2014-07-23 VITALS — BP 100/68 | HR 71 | Temp 98.3°F | Resp 16 | Ht 72.0 in | Wt 173.0 lb

## 2014-07-23 DIAGNOSIS — Z23 Encounter for immunization: Secondary | ICD-10-CM | POA: Diagnosis not present

## 2014-07-23 DIAGNOSIS — I10 Essential (primary) hypertension: Secondary | ICD-10-CM | POA: Diagnosis not present

## 2014-07-23 DIAGNOSIS — E785 Hyperlipidemia, unspecified: Secondary | ICD-10-CM | POA: Diagnosis not present

## 2014-07-23 DIAGNOSIS — K5730 Diverticulosis of large intestine without perforation or abscess without bleeding: Secondary | ICD-10-CM

## 2014-07-23 NOTE — Assessment & Plan Note (Addendum)
Lipids, LFTs, TSH ? Done by Nephrologist Records requested for review

## 2014-07-23 NOTE — Progress Notes (Signed)
   Subjective:    Patient ID: Henry Adams, male    DOB: Apr 10, 1940, 74 y.o.   MRN: [id number]  HPI The patient is here to assess status of active health conditions.  PMH, FH, & Social History reviewed & updated.His older sister has just had a major CVA and is hospitalized.  He is on a heart healthy, low-salt diet. He's been compliant with his medicines without adverse effects. He walks 1-2 miles per day without cardio pulmonary symptoms.  Colonoscopy was performed 2011 and revealed diverticulosis. He has no active GI symptoms.    Review of Systems Chest pain, palpitations, tachycardia, exertional dyspnea, paroxysmal nocturnal dyspnea, claudication or edema are absent. No unexplained weight loss, abdominal pain, significant dyspepsia, dysphagia, melena, rectal bleeding, or persistently small caliber stools. Dysuria, pyuria, hematuria, frequency, nocturia or polyuria are denied. Change in hair, skin, nails denied. No bowel changes of constipation or diarrhea. No intolerance to heat ;intolerant to cold.     Objective:   Physical Exam Pertinent or positive findings include:Hearing aids present bilaterally. Bilateral ptosis. Nasal septum deviated to the right. Prominence of the upper sternum and winging of the inferior thoracic borders. Abdomen protuberant . Crepitus of the knees. DIP osteoarthritic changes of the right hand.Scattered keratoses over the trunk present. 2 papular, rough lesions over the right forearm.   General appearance :adequately nourished; in no distress.  Eyes: No conjunctival inflammation or scleral icterus is present.  Oral exam:  Lips and gums are healthy appearing.There is no oropharyngeal erythema or exudate noted. Dental hygiene is good.  Heart:  Normal rate and regular rhythm. S1 and S2 normal without gallop, murmur, click, rub or other extra sounds    Lungs:Chest clear to auscultation; no wheezes, rhonchi,rales ,or rubs present.No increased work of breathing.    Abdomen: bowel sounds normal, soft and non-tender without masses, organomegaly or hernias noted.  No guarding or rebound.   Vascular : all pulses equal ; no bruits present.  Skin:Warm & dry.  Intact without suspicious lesions or rashes ; no tenting or jaundice   Lymphatic: No lymphadenopathy is noted about the head, neck, axilla  Neuro: Strength, tone & DTRs normal.         Assessment & Plan:  See Current Assessment & Plan in Problem List under specific Diagnosis

## 2014-07-23 NOTE — Patient Instructions (Addendum)
Minimal Blood Pressure Goal= AVERAGE < 140/90;  Ideal is an AVERAGE < 135/85. This AVERAGE should be calculated from @ least 5-7 BP readings taken @ different times of day on different days of week. You should not respond to isolated BP readings , but rather the AVERAGE for that week .Please bring your  blood pressure cuff to office visits to verify that it is reliable.It  can also be checked against the blood pressure device at the pharmacy. Finger or wrist cuffs are not dependable; an arm cuff is. Your next office appointment will be determined based upon review of your labs  Followup as needed for any active or acute issue. Please report any significant change in your symptoms.  Colonoscopy due 2021

## 2014-07-23 NOTE — Progress Notes (Signed)
Pre visit review using our clinic review tool, if applicable. No additional management support is needed unless otherwise documented below in the visit note. 

## 2014-07-23 NOTE — Assessment & Plan Note (Signed)
Blood pressure goals reviewed. BMET as per Nephrology

## 2014-07-26 NOTE — Assessment & Plan Note (Signed)
Colonoscopy due 2021

## 2014-08-04 ENCOUNTER — Telehealth: Payer: Self-pay | Admitting: Internal Medicine

## 2014-08-04 NOTE — Telephone Encounter (Signed)
Rec'd from [place] forward 8 pages to Dr.Hopper

## 2014-08-06 ENCOUNTER — Ambulatory Visit (INDEPENDENT_AMBULATORY_CARE_PROVIDER_SITE_OTHER)
Admission: RE | Admit: 2014-08-06 | Discharge: 2014-08-06 | Disposition: A | Discharge status: Home / Self Care | Payer: Medicare Other | Source: Ambulatory Visit | Attending: Internal Medicine | Admitting: Internal Medicine

## 2014-08-06 ENCOUNTER — Encounter: Payer: Self-pay | Admitting: Internal Medicine

## 2014-08-06 ENCOUNTER — Ambulatory Visit (INDEPENDENT_AMBULATORY_CARE_PROVIDER_SITE_OTHER): Payer: Medicare Other | Admitting: Internal Medicine

## 2014-08-06 ENCOUNTER — Telehealth: Payer: Self-pay

## 2014-08-06 ENCOUNTER — Other Ambulatory Visit (INDEPENDENT_AMBULATORY_CARE_PROVIDER_SITE_OTHER): Payer: Medicare Other

## 2014-08-06 VITALS — BP 112/78 | HR 71 | Temp 97.6°F | Resp 16 | Ht 72.0 in | Wt 170.0 lb

## 2014-08-06 DIAGNOSIS — J029 Acute pharyngitis, unspecified: Secondary | ICD-10-CM

## 2014-08-06 DIAGNOSIS — R6883 Chills (without fever): Secondary | ICD-10-CM | POA: Diagnosis not present

## 2014-08-06 DIAGNOSIS — E785 Hyperlipidemia, unspecified: Secondary | ICD-10-CM

## 2014-08-06 DIAGNOSIS — F32A Depression, unspecified: Secondary | ICD-10-CM

## 2014-08-06 DIAGNOSIS — R059 Cough, unspecified: Secondary | ICD-10-CM

## 2014-08-06 DIAGNOSIS — F329 Major depressive disorder, single episode, unspecified: Secondary | ICD-10-CM

## 2014-08-06 DIAGNOSIS — R05 Cough: Secondary | ICD-10-CM | POA: Diagnosis not present

## 2014-08-06 LAB — CBC WITH DIFFERENTIAL/PLATELET
BASOS ABS: 0 10*3/uL (ref 0.0–0.1)
Basophils Relative: 0.5 % (ref 0.0–3.0)
EOS ABS: 0.6 10*3/uL (ref 0.0–0.7)
EOS PCT: 7.1 % — AB (ref 0.0–5.0)
HCT: 39.3 % (ref 39.0–52.0)
Hemoglobin: 13.2 g/dL (ref 13.0–17.0)
LYMPHS ABS: 2.2 10*3/uL (ref 0.7–4.0)
LYMPHS PCT: 25.3 % (ref 12.0–46.0)
MCHC: 33.5 g/dL (ref 30.0–36.0)
MCV: 92.5 fl (ref 78.0–100.0)
MONOS PCT: 11.1 % (ref 3.0–12.0)
Monocytes Absolute: 1 10*3/uL (ref 0.1–1.0)
NEUTROS PCT: 56 % (ref 43.0–77.0)
Neutro Abs: 4.8 10*3/uL (ref 1.4–7.7)
PLATELETS: 217 10*3/uL (ref 150.0–400.0)
RBC: 4.25 Mil/uL (ref 4.22–5.81)
RDW: 12.8 % (ref 11.5–15.5)
WBC: 8.6 10*3/uL (ref 4.0–10.5)

## 2014-08-06 LAB — LIPID PANEL
Cholesterol: 142 mg/dL (ref 0–200)
HDL: 46.1 mg/dL (ref >39.00)
LDL CALC: 84 mg/dL (ref 0–99)
NonHDL: 95.9
TRIGLYCERIDES: 59 mg/dL (ref 0.0–149.0)
Total CHOL/HDL Ratio: 3
VLDL: 11.8 mg/dL (ref 0.0–40.0)

## 2014-08-06 LAB — HEPATIC FUNCTION PANEL
ALK PHOS: 99 U/L (ref 39–117)
ALT: 11 U/L (ref 0–53)
AST: 15 U/L (ref 0–37)
Albumin: 3.6 g/dL (ref 3.5–5.2)
BILIRUBIN DIRECT: 0.1 mg/dL (ref 0.0–0.3)
TOTAL PROTEIN: 7.7 g/dL (ref 6.0–8.3)
Total Bilirubin: 0.5 mg/dL (ref 0.2–1.2)

## 2014-08-06 LAB — BETA STREP SCREEN: STREPTOCOCCUS, GROUP A SCREEN (DIRECT): NEGATIVE

## 2014-08-06 LAB — TSH: TSH: 1.85 u[IU]/mL (ref 0.35–4.50)

## 2014-08-06 MED ORDER — AZITHROMYCIN 250 MG PO TABS: ORAL_TABLET | ORAL | Status: DC

## 2014-08-06 MED ORDER — SERTRALINE HCL 25 MG PO TABS: 25.0000 mg | ORAL_TABLET | Freq: Every day | ORAL | Status: DC

## 2014-08-06 NOTE — Progress Notes (Signed)
   Subjective:    Patient ID: Henry Adams, male    DOB: 04-17-1940, 74 y.o.   MRN: [id number]  HPI For the last 7-10 days he has had night sweats with associated sore throat. He also describes a dry cough. At night he'll have chills & find his night shirt wet. He has no other localizing signs or symptoms.  Also he has lost his sister and describes anhedonia. He states that he does not have anorexia or insomnia but he "eats to live" without specific enjoyment to eatng. He denies anxiety, irritability, panic attacks, or difficulty triaging.  Extensive labs performed 07/21/14 by his nephrologist were reviewed. Creatinine was 1.64, BUN 28, and GFR 40. Liver enzymes were normal. Lipids were excellent. No TSH or CBC and differential were performed   Review of Systems  Frontal headache, facial pain , nasal purulence, dental pain,  otic pain or otic discharge denied.  Sputum production, hemoptysis, or pleuritic pain denied.  No diarrhea present.Cola colored urine or clay colored stools denied.  Dysuria, pyuria, or hematuria not present.   No new rashes, pustules, vesicles.  No redness or swelling of joints.  No significant travel, pets, or tick exposures.        Objective:   Physical Exam  Pertinent or positive findings include:He has bilateral hearing aids. The left tympanic membrane is dull. The nasal septum is deviated to the right. The ribs and sternum are prominent with somewhat of a pectus deformity. Abdomen is protuberant with a ventral hernia. Affect is flat; but he is communicative and oriented.  General appearance :adequately nourished; in no distress.  Eyes: No conjunctival inflammation or scleral icterus is present.  Oral exam:  Lips and gums are healthy appearing.There is no oropharyngeal erythema or exudate noted. Dental hygiene is good.  Heart:  Normal rate and regular rhythm. S1 and S2 normal without gallop, murmur, click, rub or other extra sounds    Lungs:Chest  clear to auscultation; no wheezes, rhonchi,rales ,or rubs present.No increased work of breathing.   Abdomen: bowel sounds normal, soft and non-tender without masses, organomegaly or hernias noted.  No guarding or rebound. No flank tenderness to percussion.  Vascular : all pulses equal ; no bruits present.  Skin:Warm & dry.  Intact without suspicious lesions or rashes ; no tenting or jaundice   Lymphatic: No lymphadenopathy is noted about the head, neck, axilla.   Neuro: Strength, tone & DTRs normal.          Assessment & Plan:  #1 chills and sweats  #2 cough  #3 pharyngitis  #4 situational depression  See orders and recommendations

## 2014-08-06 NOTE — Progress Notes (Signed)
Pre visit review using our clinic review tool, if applicable. No additional management support is needed unless otherwise documented below in the visit note. 

## 2014-08-06 NOTE — Patient Instructions (Signed)
Zicam Melts or Zinc lozenges as per package label for sore throat .  Complementary options to boost immunity include  vitamin C 2000 mg daily; & Echinacea for 4-7 days.  Fill the  prescription for antibiotic if fever; discolored nasal or chest secretions; or frontal headache or facial pain  present

## 2014-08-09 NOTE — Telephone Encounter (Signed)
error 

## 2014-09-03 ENCOUNTER — Encounter: Payer: Self-pay | Admitting: Internal Medicine

## 2014-09-03 ENCOUNTER — Ambulatory Visit (INDEPENDENT_AMBULATORY_CARE_PROVIDER_SITE_OTHER): Payer: Medicare Other | Admitting: Internal Medicine

## 2014-09-03 VITALS — BP 118/76 | HR 74 | Temp 97.5°F | Ht 70.0 in | Wt 172.0 lb

## 2014-09-03 DIAGNOSIS — J309 Allergic rhinitis, unspecified: Secondary | ICD-10-CM

## 2014-09-03 DIAGNOSIS — R490 Dysphonia: Secondary | ICD-10-CM | POA: Diagnosis not present

## 2014-09-03 DIAGNOSIS — I10 Essential (primary) hypertension: Secondary | ICD-10-CM | POA: Diagnosis not present

## 2014-09-03 NOTE — Patient Instructions (Signed)
Please continue all other medications as before, and refills have been done if requested.  Please have the pharmacy call with any other refills you may need.  Please continue your efforts at being more active, low cholesterol diet, and weight control.  Please keep your appointments with your specialists as you may have planned  Please call in 1 week for ENT referral to Dr Erik Obey if not improving

## 2014-09-03 NOTE — Assessment & Plan Note (Signed)
Likely residual post infectious, ok to follow for one more wk as has been improving overall in the past wk, but to call for referral to local ENT per pt preference  Dr Erik Obey if persists

## 2014-09-03 NOTE — Assessment & Plan Note (Signed)
stable overall by history and exam, recent data reviewed with pt, and pt to continue medical treatment as before,  to f/u any worsening symptoms or concerns BP Readings from Last 3 Encounters:  09/03/14 118/76  08/06/14 112/78  07/23/14 100/68

## 2014-09-03 NOTE — Progress Notes (Signed)
Pre visit review using our clinic review tool, if applicable. No additional management support is needed unless otherwise documented below in the visit note. 

## 2014-09-03 NOTE — Assessment & Plan Note (Signed)
stable overall by history and exam,, and pt to continue medical treatment as before,  to f/u any worsening symptoms or concerns,m I think less likely to be related to the hoarseness or cough than recent infection

## 2014-09-03 NOTE — Progress Notes (Signed)
Subjective:    Patient ID: Henry Adams, male    DOB: 1940/10/06, 74 y.o.   MRN: [id number]  HPI   Here to f/u, overall 3 wks of infectious symptoms URI related seemed to have resolved, still with residual hoarseness and and small localized right mid neck soreness, no HA, sinus congestion, fever, chills, cough, sob or wheezing which have resolved.  Stil some fatigued as well, somewhat nervous today.  Is s/o larynx surgury with implant at [facility] approx 18 mo ago and seemed to work well "for a while" per pt, has not required recent f/u.  Pt denies chest pain, increased sob or doe, wheezing, orthopnea, PND, increased LE swelling, palpitations, dizziness or syncope. Pt denies new neurological symptoms such as new headache, or facial or extremity weakness or numbness. No blood, a dn  Pt denies fever, wt loss, night sweats, loss of appetite, or other constitutional symptoms   Has had no significant allergy symtpoms on current meds Past Medical History  Diagnosis Date  . Hypertension   . GERD (gastroesophageal reflux disease)   . Allergy     perennial  . CKD (chronic kidney disease)   . Polycystic kidney disease   . HLD (hyperlipidemia)   . Hx of cardiovascular stress test     ETT-Myoview 6/14: probable low risk study, inf defect with normal inf wall motion-cannot r/o ischemia; prominent gut uptake adjacent to inf wall on rest images-poss defect is atten, EF 63%   Past Surgical History  Procedure Laterality Date  . Tonsillectomy and adenoidectomy    . Hernia repair      umbilical  . Colonoscopy  2011    diverticulosis  . Medialization laryngoplasty  10/15/2013    Dr Joya Gaskins, [facility]    reports that he has never smoked. He does not have any smokeless tobacco history on file. He reports that he drinks alcohol. He reports that he does not use illicit drugs. family history includes Cancer in his daughter and sister; Heart disease in his brother; Hypertension in his father; Skin cancer in his brother;  Stroke in his sister. Allergies  Allergen Reactions  . Iodine     rash  . Diazepam     Mental status changes   . Oxycodone     Mental status changes & nausea   Current Outpatient Prescriptions on File Prior to Visit  Medication Sig Dispense Refill  . aspirin 81 MG tablet Take 81 mg by mouth daily.      [name] azelastine (ASTELIN) 137 MCG/SPRAY nasal spray Place 2 sprays into the nose 2 (two) times daily. Use in each nostril as directed    . fexofenadine (ALLEGRA) 180 MG tablet Take 180 mg by mouth daily.      . hydrochlorothiazide (HYDRODIURIL) 25 MG tablet Take 12.5 mg by mouth 2 (two) times daily.     . meclizine (ANTIVERT) 12.5 MG tablet Take 12.5 mg by mouth as needed.      [name] omeprazole (PRILOSEC) 40 MG capsule Take 40 mg by mouth daily.    . sertraline (ZOLOFT) 25 MG tablet Take 1 tablet (25 mg total) by mouth daily. 30 tablet 2  . telmisartan (MICARDIS) 40 MG tablet Take 40 mg by mouth daily.      . vitamin C (ASCORBIC ACID) 500 MG tablet Take 1,000 mg by mouth daily.     [name] azithromycin (ZITHROMAX Z-PAK) 250 MG tablet 2 day 1, then 1 qd (Patient not taking: Reported on 09/03/2014) 6 tablet 0  No current facility-administered medications on file prior to visit.   Review of Systems  Constitutional: Negative for unusual diaphoresis or night sweats HENT: Negative for ringing in ear or discharge Eyes: Negative for double vision or worsening visual disturbance.  Respiratory: Negative for choking and stridor.   Gastrointestinal: Negative for vomiting or other signifcant bowel change Genitourinary: Negative for hematuria or change in urine volume.  Musculoskeletal: Negative for other MSK pain or swelling Skin: Negative for color change and worsening wound.  Neurological: Negative for tremors and numbness other than noted  Psychiatric/Behavioral: Negative for decreased concentration or agitation other than above       Objective:   Physical Exam BP 118/76 mmHg  Pulse 74  Temp(Src)  97.5 F (36.4 C) (Oral)  Ht 5\' 10"  (1.778 m)  Wt 172 lb (78.019 kg)  BMI 24.68 kg/m2  SpO2 98% VS noted,  Constitutional: Pt appears in no significant distress HENT: Head: NCAT.  Right Ear: External ear normal.  Left Ear: External ear normal.  Eyes: . Pupils are equal, round, and reactive to light. Conjunctivae and EOM are normal Bilat tm's with trace erythema, no bulging  Max sinus areas non tender.  Pharynx with mild erythema, no exudate, no mass or swelling Neck: Normal range of motion. Neck supple.  Cardiovascular: Normal rate and regular rhythm.   Pulmonary/Chest: Effort normal and breath sounds without rales or wheezing.  Neurological: Pt is alert. Not confused , motor grossly intact Skin: Skin is warm. No rash, no LE edema Psychiatric: Pt behavior is normal. No agitation. mild nervous     Assessment & Plan:

## 2015-02-25 ENCOUNTER — Telehealth: Payer: Self-pay

## 2015-02-25 NOTE — Telephone Encounter (Signed)
Call to fup regarding AWV; Stated he plans to fup at this office but is at a job site now and asked if I can outreach next week;  Will do so.

## 2015-03-01 DIAGNOSIS — J3081 Allergic rhinitis due to animal (cat) (dog) hair and dander: Secondary | ICD-10-CM | POA: Diagnosis not present

## 2015-03-01 DIAGNOSIS — J301 Allergic rhinitis due to pollen: Secondary | ICD-10-CM | POA: Diagnosis not present

## 2015-03-01 DIAGNOSIS — J3089 Other allergic rhinitis: Secondary | ICD-10-CM | POA: Diagnosis not present

## 2015-03-02 NOTE — Telephone Encounter (Signed)
2nd outreach to schedule AWV; left VM for call back

## 2015-03-08 DIAGNOSIS — J301 Allergic rhinitis due to pollen: Secondary | ICD-10-CM | POA: Diagnosis not present

## 2015-03-08 DIAGNOSIS — J3081 Allergic rhinitis due to animal (cat) (dog) hair and dander: Secondary | ICD-10-CM | POA: Diagnosis not present

## 2015-03-08 DIAGNOSIS — J3089 Other allergic rhinitis: Secondary | ICD-10-CM | POA: Diagnosis not present

## 2015-03-23 DIAGNOSIS — J383 Other diseases of vocal cords: Secondary | ICD-10-CM | POA: Diagnosis not present

## 2015-03-23 DIAGNOSIS — J387 Other diseases of larynx: Secondary | ICD-10-CM | POA: Diagnosis not present

## 2015-03-23 DIAGNOSIS — J384 Edema of larynx: Secondary | ICD-10-CM | POA: Diagnosis not present

## 2015-03-23 DIAGNOSIS — J3089 Other allergic rhinitis: Secondary | ICD-10-CM | POA: Diagnosis not present

## 2015-03-23 DIAGNOSIS — J301 Allergic rhinitis due to pollen: Secondary | ICD-10-CM | POA: Diagnosis not present

## 2015-03-23 DIAGNOSIS — R490 Dysphonia: Secondary | ICD-10-CM | POA: Diagnosis not present

## 2015-03-23 DIAGNOSIS — L538 Other specified erythematous conditions: Secondary | ICD-10-CM | POA: Diagnosis not present

## 2015-03-23 DIAGNOSIS — J3081 Allergic rhinitis due to animal (cat) (dog) hair and dander: Secondary | ICD-10-CM | POA: Diagnosis not present

## 2015-03-25 DIAGNOSIS — J3089 Other allergic rhinitis: Secondary | ICD-10-CM | POA: Diagnosis not present

## 2015-03-29 DIAGNOSIS — J3089 Other allergic rhinitis: Secondary | ICD-10-CM | POA: Diagnosis not present

## 2015-03-29 DIAGNOSIS — J3081 Allergic rhinitis due to animal (cat) (dog) hair and dander: Secondary | ICD-10-CM | POA: Diagnosis not present

## 2015-03-30 ENCOUNTER — Telehealth: Payer: Self-pay | Admitting: Internal Medicine

## 2015-03-30 DIAGNOSIS — J3089 Other allergic rhinitis: Secondary | ICD-10-CM | POA: Diagnosis not present

## 2015-03-30 DIAGNOSIS — J301 Allergic rhinitis due to pollen: Secondary | ICD-10-CM | POA: Diagnosis not present

## 2015-03-30 DIAGNOSIS — J3081 Allergic rhinitis due to animal (cat) (dog) hair and dander: Secondary | ICD-10-CM | POA: Diagnosis not present

## 2015-03-30 NOTE — Telephone Encounter (Signed)
Patient would like to know if Dr. Ronnald Ramp would take on as patient.

## 2015-03-30 NOTE — Telephone Encounter (Signed)
yes

## 2015-03-31 NOTE — Telephone Encounter (Signed)
Got patient scheduled

## 2015-04-04 ENCOUNTER — Ambulatory Visit (INDEPENDENT_AMBULATORY_CARE_PROVIDER_SITE_OTHER): Payer: Medicare Other | Admitting: Internal Medicine

## 2015-04-04 ENCOUNTER — Encounter: Payer: Self-pay | Admitting: Internal Medicine

## 2015-04-04 VITALS — BP 130/80 | HR 71 | Temp 97.7°F | Resp 18 | Ht 70.0 in | Wt 170.0 lb

## 2015-04-04 DIAGNOSIS — D649 Anemia, unspecified: Secondary | ICD-10-CM | POA: Diagnosis not present

## 2015-04-04 DIAGNOSIS — G252 Other specified forms of tremor: Secondary | ICD-10-CM | POA: Insufficient documentation

## 2015-04-04 DIAGNOSIS — I10 Essential (primary) hypertension: Secondary | ICD-10-CM

## 2015-04-04 DIAGNOSIS — J383 Other diseases of vocal cords: Secondary | ICD-10-CM

## 2015-04-04 DIAGNOSIS — Q613 Polycystic kidney, unspecified: Secondary | ICD-10-CM | POA: Diagnosis not present

## 2015-04-04 NOTE — Patient Instructions (Signed)
Tremor °A tremor is trembling or shaking that you cannot control. Most tremors affect the hands or arms. Tremors can also affect the head, vocal cords, face, and other parts of the body. There are many types of tremors. Common types include:  °· Essential tremor. These usually occur in people over the age of 40. It may run in families and can happen in otherwise healthy people.   °· Resting tremor. These occur when the muscles are at rest, such as when your hands are resting in your lap. People with Parkinson disease often have resting tremors.   °· Postural tremor. These occur when you try to hold a pose, such as keeping your hands outstretched.   °· Kinetic tremor. These occur during purposeful movement, such as trying to touch a finger to your nose.   °· Task-specific tremor. These may occur when you perform tasks such as handwriting, speaking, or standing.   °· Psychogenic tremor. These dramatically lessen or disappear when you are distracted. They can happen in people of all ages.   °Some types of tremors have no known cause. Tremors can also be a symptom of nervous system problems (neurological disorders) that may occur with aging. Some tremors go away with treatment while others do not.  °HOME CARE INSTRUCTIONS °Watch your tremor for any changes. The following actions may help to lessen any discomfort you are feeling: °· Take medicines only as directed by your health care provider.   °· Limit alcohol intake to no more than 1 drink per day for nonpregnant women and 2 drinks per day for men. One drink equals 12 oz of beer, 5 oz of wine, or 1½ oz of hard liquor. °· Do not use any tobacco products, including cigarettes, chewing tobacco, or electronic cigarettes. If you need help quitting, ask your health care provider.   °· Avoid extreme heat or cold.    °· Limit the amount of caffeine you consume as directed by your health care provider.   °· Try to get 8 hours of sleep each night. °· Find ways to manage your  stress, such as meditation or yoga. °· Keep all follow-up visits as directed by your health care provider. This is important. °SEEK MEDICAL CARE IF: °· You start having a tremor after starting a new medicine. °· You have tremor with other symptoms such as: °¨ Numbness. °¨ Tingling. °¨ Pain. °¨ Weakness. °· Your tremor gets worse. °· Your tremor interferes with your day-to-day life. °  °This information is not intended to replace advice given to you by your health care provider. Make sure you discuss any questions you have with your health care provider. °  °Document Released: 12/29/2001 Document Revised: 01/29/2014 Document Reviewed: 07/06/2013 °Elsevier Interactive Patient Education ©2016 [employer] ° °

## 2015-04-04 NOTE — Progress Notes (Signed)
Subjective:  Patient ID: Henry Adams, male    DOB: [birth year]  Age: 75 y.o. MRN: [id number]  CC: Hypertension; Anemia; and Annual Exam  New to me.  HPI Henry Adams presents for a AWV, BP check and concerns about vocal cord dysfunction and tremors. He had a vocal cord implant done 1.5 years ago by an ENT at [facility] and now complains of intention tremor in both hands and he wants to be referred to neurology to be evaluated by Parkinson's disease.  He tells me that his BP has been well controlled, he denies HA, CP, SOB, edema, or DOE.  History Henry Adams has a past medical history of Hypertension; GERD (gastroesophageal reflux disease); Allergy; CKD (chronic kidney disease); Polycystic kidney disease; HLD (hyperlipidemia); and cardiovascular stress test.   He has past surgical history that includes Tonsillectomy and adenoidectomy; Hernia repair; Colonoscopy (2011); and medialization laryngoplasty (10/15/2013).   His family history includes Cancer in his daughter and sister; Heart disease in his brother; Hypertension in his father; Skin cancer in his brother; Stroke in his sister.He reports that he has never smoked. He does not have any smokeless tobacco history on file. He reports that he drinks alcohol. He reports that he does not use illicit drugs.  Outpatient Prescriptions Prior to Visit  Medication Sig Dispense Refill  . aspirin 81 MG tablet Take 81 mg by mouth daily.      [name] azelastine (ASTELIN) 137 MCG/SPRAY nasal spray Place 1 spray into both nostrils 2 (two) times daily. Use in each nostril as directed    . fexofenadine (ALLEGRA) 180 MG tablet Take 180 mg by mouth daily.      . hydrochlorothiazide (HYDRODIURIL) 25 MG tablet Take 12.5 mg by mouth daily.     . meclizine (ANTIVERT) 12.5 MG tablet Take 12.5 mg by mouth as needed.      [name] telmisartan (MICARDIS) 40 MG tablet Take 20 mg by mouth daily.     . vitamin C (ASCORBIC ACID) 500 MG tablet Take 1,000 mg by mouth daily.     [name] azithromycin  (ZITHROMAX Z-PAK) 250 MG tablet 2 day 1, then 1 qd (Patient not taking: Reported on 09/03/2014) 6 tablet 0  . omeprazole (PRILOSEC) 40 MG capsule Take 40 mg by mouth daily as needed. Reported on 04/04/2015    . sertraline (ZOLOFT) 25 MG tablet Take 1 tablet (25 mg total) by mouth daily. (Patient not taking: Reported on 04/04/2015) 30 tablet 2   No facility-administered medications prior to visit.    ROS Review of Systems  Constitutional: Negative.  Negative for fever, chills, appetite change and fatigue.  HENT: Positive for voice change. Negative for trouble swallowing.   Eyes: Negative.  Negative for photophobia and visual disturbance.  Respiratory: Negative.  Negative for cough, chest tightness, shortness of breath and stridor.   Cardiovascular: Negative.  Negative for chest pain, palpitations and leg swelling.  Gastrointestinal: Negative.  Negative for nausea, vomiting, abdominal pain, diarrhea, constipation and blood in stool.  Endocrine: Negative.   Genitourinary: Negative.   Musculoskeletal: Negative.  Negative for myalgias, back pain, joint swelling and arthralgias.  Skin: Negative.  Negative for color change and rash.  Allergic/Immunologic: Negative.   Neurological: Positive for tremors. Negative for dizziness, weakness, light-headedness, numbness and headaches.  Hematological: Negative.  Negative for adenopathy. Does not bruise/bleed easily.  Psychiatric/Behavioral: Negative.     Objective:  BP 130/80 mmHg  Pulse 71  Temp(Src) 97.7 F (36.5 C) (Oral)  Resp 18  Ht  5\' 10"  (1.778 m)  Wt 170 lb (77.111 kg)  BMI 24.39 kg/m2  SpO2 95%  Physical Exam  Constitutional: No distress.  HENT:  Mouth/Throat: Oropharynx is clear and moist. No oropharyngeal exudate.  Eyes: Conjunctivae are normal. Right eye exhibits no discharge. Left eye exhibits no discharge. No scleral icterus.  Neck: Normal range of motion. Neck supple. No JVD present. No tracheal deviation present. No thyromegaly  present.  Cardiovascular: Normal rate, normal heart sounds and intact distal pulses.  Exam reveals no gallop and no friction rub.   No murmur heard. Pulmonary/Chest: Effort normal and breath sounds normal. No stridor. No respiratory distress. He has no wheezes. He has no rales. He exhibits no tenderness.  Abdominal: Soft. Bowel sounds are normal. He exhibits no distension and no mass. There is no tenderness. There is no rebound and no guarding.  Musculoskeletal: Normal range of motion. He exhibits no edema or tenderness.  Lymphadenopathy:    He has no cervical adenopathy.  Neurological: He has normal strength. He displays tremor. He displays no atrophy. No cranial nerve deficit or sensory deficit. He exhibits normal muscle tone. He displays a negative Romberg sign. Coordination and gait normal.  Faint, coarse tremor noted in right hand > left hand  Skin: Skin is warm and dry. No rash noted. He is not diaphoretic. No erythema. No pallor.  Psychiatric: He has a normal mood and affect. His behavior is normal. Judgment and thought content normal.  Vitals reviewed.   Lab Results  Component Value Date   WBC 8.6 08/06/2014   HGB 13.2 08/06/2014   HCT 39.3 08/06/2014   PLT 217.0 08/06/2014   GLUCOSE 81 08/01/2012   CHOL 142 08/06/2014   TRIG 59.0 08/06/2014   HDL 46.10 08/06/2014   LDLDIRECT 131.7 07/02/2011   LDLCALC 84 08/06/2014   ALT 11 08/06/2014   AST 15 08/06/2014   NA 139 08/01/2012   K 4.4 08/01/2012   CL 104 08/01/2012   CREATININE 1.5 08/01/2012   BUN 27* 08/01/2012   CO2 30 08/01/2012   TSH 1.85 08/06/2014   PSA 1.73 07/02/2011   INR 1.1* 08/01/2012   HGBA1C 5.9 12/29/2009    Assessment & Plan:   Henry Adams was seen today for hypertension, anemia and annual exam.  Diagnoses and all orders for this visit:  Essential hypertension- his blood pressures well controlled, I will monitor his electrolytes and renal function. -     Basic metabolic panel; Future -     TSH;  Future  ANEMIA-NOS- he has no signs or symptoms of blood loss, I will recheck his CBC today and will advise further if indicated. -     CBC with Differential/Platelet; Future  Vocal cord dysfunction -     Ambulatory referral to Neurology  Polycystic kidney - He is concerned that the proton pump inhibitor may have damaged his renal function so we agreed that he would stop taking it today. -     Basic metabolic panel; Future -     Urinalysis, Routine w reflex microscopic (not at [facility]); Future  Coarse tremor- he will see neurology for further evaluation. -     Ambulatory referral to Neurology  I have discontinued Henry Adams omeprazole, sertraline, and azithromycin. I am also having him maintain his vitamin C, fexofenadine, aspirin, meclizine, azelastine, telmisartan, and hydrochlorothiazide.  No orders of the defined types were placed in this encounter.     Follow-up: Return in about 6 months (around 10/05/2015).  Scarlette Calico, MD

## 2015-04-08 DIAGNOSIS — J3089 Other allergic rhinitis: Secondary | ICD-10-CM | POA: Diagnosis not present

## 2015-04-08 DIAGNOSIS — J3081 Allergic rhinitis due to animal (cat) (dog) hair and dander: Secondary | ICD-10-CM | POA: Diagnosis not present

## 2015-04-08 DIAGNOSIS — J301 Allergic rhinitis due to pollen: Secondary | ICD-10-CM | POA: Diagnosis not present

## 2015-04-13 ENCOUNTER — Encounter: Payer: Self-pay | Admitting: Neurology

## 2015-04-14 DIAGNOSIS — J301 Allergic rhinitis due to pollen: Secondary | ICD-10-CM | POA: Diagnosis not present

## 2015-04-14 DIAGNOSIS — J3089 Other allergic rhinitis: Secondary | ICD-10-CM | POA: Diagnosis not present

## 2015-04-14 DIAGNOSIS — J3081 Allergic rhinitis due to animal (cat) (dog) hair and dander: Secondary | ICD-10-CM | POA: Diagnosis not present

## 2015-04-19 ENCOUNTER — Other Ambulatory Visit: Payer: Self-pay | Admitting: Internal Medicine

## 2015-04-19 ENCOUNTER — Other Ambulatory Visit (INDEPENDENT_AMBULATORY_CARE_PROVIDER_SITE_OTHER): Payer: Medicare Other

## 2015-04-19 ENCOUNTER — Other Ambulatory Visit: Payer: Medicare Other

## 2015-04-19 ENCOUNTER — Encounter: Payer: Self-pay | Admitting: Neurology

## 2015-04-19 ENCOUNTER — Ambulatory Visit (INDEPENDENT_AMBULATORY_CARE_PROVIDER_SITE_OTHER): Payer: Medicare Other | Admitting: Neurology

## 2015-04-19 VITALS — BP 140/82 | HR 70 | Ht 71.5 in | Wt 170.0 lb

## 2015-04-19 DIAGNOSIS — I10 Essential (primary) hypertension: Secondary | ICD-10-CM | POA: Diagnosis not present

## 2015-04-19 DIAGNOSIS — D649 Anemia, unspecified: Secondary | ICD-10-CM

## 2015-04-19 DIAGNOSIS — J3081 Allergic rhinitis due to animal (cat) (dog) hair and dander: Secondary | ICD-10-CM | POA: Diagnosis not present

## 2015-04-19 DIAGNOSIS — R251 Tremor, unspecified: Secondary | ICD-10-CM

## 2015-04-19 DIAGNOSIS — Q613 Polycystic kidney, unspecified: Secondary | ICD-10-CM | POA: Diagnosis not present

## 2015-04-19 DIAGNOSIS — H02402 Unspecified ptosis of left eyelid: Secondary | ICD-10-CM | POA: Diagnosis not present

## 2015-04-19 DIAGNOSIS — Q670 Congenital facial asymmetry: Secondary | ICD-10-CM

## 2015-04-19 DIAGNOSIS — J3089 Other allergic rhinitis: Secondary | ICD-10-CM | POA: Diagnosis not present

## 2015-04-19 DIAGNOSIS — R490 Dysphonia: Secondary | ICD-10-CM | POA: Diagnosis not present

## 2015-04-19 DIAGNOSIS — J301 Allergic rhinitis due to pollen: Secondary | ICD-10-CM | POA: Diagnosis not present

## 2015-04-19 LAB — BASIC METABOLIC PANEL
BUN: 26 mg/dL — ABNORMAL HIGH (ref 6–23)
CHLORIDE: 102 meq/L (ref 96–112)
CO2: 29 meq/L (ref 19–32)
Calcium: 8.8 mg/dL (ref 8.4–10.5)
Creatinine, Ser: 1.7 mg/dL — ABNORMAL HIGH (ref 0.40–1.50)
GFR: 41.73 mL/min — ABNORMAL LOW (ref >60.00)
Glucose, Bld: 94 mg/dL (ref 70–99)
Potassium: 3.7 mEq/L (ref 3.5–5.1)
SODIUM: 139 meq/L (ref 135–145)

## 2015-04-19 LAB — VITAMIN B12: Vitamin B-12: 446 pg/mL (ref 200–1100)

## 2015-04-19 LAB — URINALYSIS, ROUTINE W REFLEX MICROSCOPIC
BILIRUBIN URINE: NEGATIVE
Hgb urine dipstick: NEGATIVE
Ketones, ur: NEGATIVE
Leukocytes, UA: NEGATIVE
Nitrite: NEGATIVE
PH: 5.5 (ref 5.0–8.0)
SPECIFIC GRAVITY, URINE: 1.02 (ref 1.000–1.030)
Total Protein, Urine: NEGATIVE
Urine Glucose: NEGATIVE
Urobilinogen, UA: 0.2 (ref 0.0–1.0)

## 2015-04-19 LAB — CBC WITH DIFFERENTIAL/PLATELET
BASOS PCT: 0.6 % (ref 0.0–3.0)
Basophils Absolute: 0 10*3/uL (ref 0.0–0.1)
Eosinophils Absolute: 0.2 10*3/uL (ref 0.0–0.7)
Eosinophils Relative: 3.7 % (ref 0.0–5.0)
HEMATOCRIT: 37.5 % — AB (ref 39.0–52.0)
Hemoglobin: 12.6 g/dL — ABNORMAL LOW (ref 13.0–17.0)
LYMPHS PCT: 30.5 % (ref 12.0–46.0)
Lymphs Abs: 1.8 10*3/uL (ref 0.7–4.0)
MCHC: 33.6 g/dL (ref 30.0–36.0)
MCV: 92 fl (ref 78.0–100.0)
MONOS PCT: 9.8 % (ref 3.0–12.0)
Monocytes Absolute: 0.6 10*3/uL (ref 0.1–1.0)
NEUTROS ABS: 3.2 10*3/uL (ref 1.4–7.7)
Neutrophils Relative %: 55.4 % (ref 43.0–77.0)
PLATELETS: 142 10*3/uL — AB (ref 150.0–400.0)
RBC: 4.07 Mil/uL — ABNORMAL LOW (ref 4.22–5.81)
RDW: 13.6 % (ref 11.5–15.5)
WBC: 5.8 10*3/uL (ref 4.0–10.5)

## 2015-04-19 LAB — TSH: TSH: 1.86 mIU/L (ref 0.40–4.50)

## 2015-04-19 NOTE — Progress Notes (Signed)
Henry Adams was seen today in the movement disorders clinic for neurologic consultation at the request of Henry Calico, MD.    Prior records made available to me were reviewed.  The patient first presented to [facility] in August, 2015 with complaints of a weak voice.    States that his voice was getting soft for a few years prior to that.   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   It helped for 4-5 months.   He recently followed up with his ENT physician at [facility] on 03/23/2015 complaining of continued weak voice, but they felt that his voice was fairly strong and did not see any pathology.  He was to follow-up with the voice lab.   The voice therapy didn't seem to help.   He did follow up with his primary care physician and was complaining about some mild tremor and was referred here to rule out Parkinson's disease.  Pt states that it is when he holds something proximally that he notes tremor bilaterally.  He also notes difficulty with handwriting; it is small and "squiggly."   Specific Symptoms:  Tremor: Yes.   (not at rest; only with activation) Family hx of similar:  No. Voice: yes with hx of vocal flaps for similar Sleep: sleeps well  Vivid Dreams:  No.  Acting out dreams:  No. Wet Pillows: No. Postural symptoms:  No.  Falls?  No. Bradykinesia symptoms: difficulty getting out of a chair Loss of smell:  Yes.   x several years and attributes to severe allergies in the past Loss of taste:  Yes.   Urinary Incontinence:  No., but has urinary urgency Difficulty Swallowing:  No. Handwriting, micrographia: Yes.   Trouble with ADL's:  No.  Trouble buttoning clothing: No. Depression:  No. Memory changes:  Yes.   (some trouble with remembering names; trouble with word retrieval; other memory good) Hallucinations:  No.  visual distortions: No. N/V:  No. Lightheaded:  No.  Syncope:  No. Diplopia:  No. Dyskinesia:  No.  Neuroimaging has not previously been performed.    PREVIOUS MEDICATIONS: none to date  ALLERGIES:   Allergies  Allergen Reactions  . Iodine     rash  . Diazepam     Mental status changes   . Oxycodone     Mental status changes & nausea  . Pollen Extract Itching  . Tamsulosin     Other reaction(s): Other (See Comments) Unknown   . Doxazosin Rash    agitation  . Etodolac Rash    unknown  . Tape Rash    Certain "Band-Aids"    CURRENT MEDICATIONS:  Outpatient Encounter Prescriptions as of 04/19/2015  Medication Sig  . aspirin 81 MG tablet Take 81 mg by mouth daily.    [name] azelastine (ASTELIN) 137 MCG/SPRAY nasal spray Place 1 spray into both nostrils 2 (two) times daily. Use in each nostril as directed  . fexofenadine (ALLEGRA) 180 MG tablet Take 180 mg by mouth daily.    . hydrochlorothiazide (HYDRODIURIL) 25 MG tablet Take 12.5 mg by mouth daily.   [name] omeprazole (PRILOSEC) 40 MG capsule Take 40 mg by mouth daily.   [name] telmisartan (MICARDIS) 40 MG tablet Take 20 mg by mouth daily.   . vitamin C (ASCORBIC ACID) 500 MG tablet Take 1,000 mg by mouth daily.   . [DISCONTINUED] meclizine (ANTIVERT) 12.5 MG tablet Take 12.5 mg  by mouth as needed.     No facility-administered encounter medications on file as of 04/19/2015.    PAST MEDICAL HISTORY:   Past Medical History  Diagnosis Date  . Hypertension   . GERD (gastroesophageal reflux disease)   . Allergy     perennial  . CKD (chronic kidney disease)   . Polycystic kidney disease   . HLD (hyperlipidemia)   . Hx of cardiovascular stress test     ETT-Myoview 6/14: probable low risk study, inf defect with normal inf wall motion-cannot r/o ischemia; prominent gut uptake adjacent to inf wall on rest images-poss defect is atten, EF 63%    PAST SURGICAL HISTORY:   Past Surgical History  Procedure Laterality Date  . Tonsillectomy and adenoidectomy    . Hernia repair      umbilical  .  Colonoscopy  2011    diverticulosis  . Medialization laryngoplasty  10/15/2013    Dr Joya Gaskins, [facility]  . Knee surgery Left     SOCIAL HISTORY:   Social History   Social History  . Marital Status: Married    Spouse Name: N/A  . Number of Children: N/A  . Years of Education: N/A   Occupational History  . Not on file.   Social History Main Topics  . Smoking status: Never Smoker   . Smokeless tobacco: Not on file  . Alcohol Use: Yes     Comment:  2-3 beers / month  . Drug Use: No  . Sexual Activity: Not on file   Other Topics Concern  . Not on file   Social History Narrative    FAMILY HISTORY:   Family Status  Relation Status Death Age  . Mother Deceased     heart disease, dementia  . Father Deceased     HTN, ulcers  . Sister Deceased     breast cancer  . Brother Deceased     cancer tongue/cheek, heart disease  . Sister Deceased     stroke, GERD    ROS:  A complete 10 system review of systems was obtained and was unremarkable apart from what is mentioned above.  PHYSICAL EXAMINATION:    VITALS:   Filed Vitals:   04/19/15 0938  BP: 140/82  Pulse: 70  Height: 5' 11.5" (1.816 m)  Weight: 170 lb (77.111 kg)    Pt undressed and placed into examining shorts.  GEN:  The patient appears stated age and is in NAD. HEENT:  Normocephalic, atraumatic.  The mucous membranes are moist. The superficial temporal arteries are without ropiness or tenderness.  There are no tongue fasciculations. CV:  RRR Lungs:  CTAB Neck/HEME:  There are no carotid bruits bilaterally.  Neurological examination:  Orientation: The patient is alert and oriented x3. Fund of knowledge is appropriate.  Recent and remote memory are intact.  Attention and concentration are normal.    Able to name objects and repeat phrases. Cranial nerves: There is good facial symmetry but there is asymmetric blink. There is slight L ptosis vs pseudoptosis (likely pseudoptosis from lid lag on the left).  Pupils  are equal round and reactive to light bilaterally. Fundoscopic exam reveals clear margins bilaterally. Extraocular muscles are intact. The visual fields are full to confrontational testing. The speech is fluent and clear.   He is mildly hypophonic.  The patient is able to make the gutteral sounds without difficulty.  Soft palate rises symmetrically and there is no tongue deviation. Hearing is intact to conversational tone. Sensation: Sensation is  intact to light and pinprick throughout (facial, trunk, extremities). Vibration is decreased at the bilateral big toe and ankle and decreased in the LE compared to the UE. There is no extinction with double simultaneous stimulation. There is no sensory dermatomal level identified. Motor: Strength is 5/5 in the bilateral upper and lower extremities.   Shoulder shrug is equal and symmetric.  There is no pronator drift.  There are no fasciculations noted across the legs, arms, chest or back. Deep tendon reflexes: Deep tendon reflexes are 2/4 at the bilateral biceps, triceps, brachioradialis, patella and achilles. Plantar responses are downgoing bilaterally.  Movement examination: Tone: There is normal tone in the bilateral upper extremities.  The tone in the lower extremities is normal.  Abnormal movements: none even with distraction procedures.  No rest or postural tremor noted Coordination:  There is no decremation with RAM's, with any form of RAMS, including alternating supination and pronation of the forearm, hand opening and closing, finger taps, heel taps and toe taps. Gait and Station: The patient has no difficulty arising out of a deep-seated chair without the use of the hands. The patient's stride length is normal.  He is able to squat down and get back up without the use of the hands.  He is able to walk in a tandem fashion without significant trouble.    Lab Results  Component Value Date   TSH 1.85 08/06/2014   No results found for: VITAMINB12     Chemistry      Component Value Date/Time   NA 139 08/01/2012 1058   K 4.4 08/01/2012 1058   CL 104 08/01/2012 1058   CO2 30 08/01/2012 1058   BUN 27* 08/01/2012 1058   CREATININE 1.5 08/01/2012 1058      Component Value Date/Time   CALCIUM 9.0 08/01/2012 1058   ALKPHOS 99 08/06/2014 1300   AST 15 08/06/2014 1300   ALT 11 08/06/2014 1300   BILITOT 0.5 08/06/2014 1300           ASSESSMENT/PLAN:  1.  Dysphonia/Hypophonia  -I see no evidence of a parkinsonian state.  I also did not see any evidence of a degenerative motor neuron disease.  I am going to check acetylcholine receptor antibodies as he does have slight asymmetric blink and slight left ptosis (although I suspect that this is pseudoptosis from lid lag).  We will also do an MRI of the brain, but I did tell him that we will likely see small vessel disease from history of hypertension and hyperlipidemia and will also likely see atrophy.  I just want to make sure I am not missing anything else.  -I told the patient that we could do an EMG if the above was negative and I will offer that to him after we complete the above tests.  However, I do suspect that the yield will be low and I told him that today.  -As I was walking out of the room, the patient asked me if the above tests would rule out Alzheimer's disease.  I told him that they would not rule out Alzheimer's disease, but we had not really discussed that as an issue, and with the exception of word retrieval, he had denied other memory issues.  I also did not see any type of memory problem when he was describing his extensive history to me today.  In addition, this certainly would not cause hypophonic speech.  If this becomes a bigger concern, I would be happy to have him  seen by our neuropsychologist for neuropsych testing.

## 2015-04-19 NOTE — Patient Instructions (Signed)
1. Your provider has requested that you have labwork completed today. Please go to [facility] ([id number]) on the second floor of this building before leaving the office today. You do not need to check in. If you are not called within 15 minutes please check with the front desk.  2. We have scheduled you at [facility] for your MRI on 04/25/2015 at 8:00 am. Please arrive 15 minutes prior and go to 1st floor radiology. If you need to reschedule for any reason please call [phone].

## 2015-04-23 LAB — MYASTHENIA GRAVIS PANEL 2
Acetylcholine Rec Mod Ab: 6 % binding inhibition
Aceytlcholine Rec Bloc Ab: <15 % of inhibition (ref <15)

## 2015-04-25 ENCOUNTER — Ambulatory Visit (HOSPITAL_COMMUNITY)
Admission: RE | Admit: 2015-04-25 | Discharge: 2015-04-25 | Disposition: A | Discharge status: Home / Self Care | Payer: Medicare Other | Source: Ambulatory Visit | Attending: Neurology | Admitting: Neurology

## 2015-04-25 ENCOUNTER — Telehealth: Payer: Self-pay | Admitting: Neurology

## 2015-04-25 DIAGNOSIS — R490 Dysphonia: Secondary | ICD-10-CM | POA: Diagnosis not present

## 2015-04-25 DIAGNOSIS — H02402 Unspecified ptosis of left eyelid: Secondary | ICD-10-CM | POA: Diagnosis not present

## 2015-04-25 DIAGNOSIS — R251 Tremor, unspecified: Secondary | ICD-10-CM

## 2015-04-25 DIAGNOSIS — Q670 Congenital facial asymmetry: Secondary | ICD-10-CM

## 2015-04-25 NOTE — Telephone Encounter (Signed)
Let pt know that MRI showed small vessel disease as we had expected (from HTN/Hyperlipid) but nothing new and nothing to explain sx's.  Labs looked okay, except kidney function has risen a bit and platelets have decreased and should f/u with Scarlette Calico, MD for that.

## 2015-04-25 NOTE — Telephone Encounter (Signed)
Patient made aware.

## 2015-05-05 DIAGNOSIS — J3089 Other allergic rhinitis: Secondary | ICD-10-CM | POA: Diagnosis not present

## 2015-05-05 DIAGNOSIS — J3081 Allergic rhinitis due to animal (cat) (dog) hair and dander: Secondary | ICD-10-CM | POA: Diagnosis not present

## 2015-05-05 DIAGNOSIS — J301 Allergic rhinitis due to pollen: Secondary | ICD-10-CM | POA: Diagnosis not present

## 2015-05-13 DIAGNOSIS — J3081 Allergic rhinitis due to animal (cat) (dog) hair and dander: Secondary | ICD-10-CM | POA: Diagnosis not present

## 2015-05-13 DIAGNOSIS — J301 Allergic rhinitis due to pollen: Secondary | ICD-10-CM | POA: Diagnosis not present

## 2015-05-13 DIAGNOSIS — J3089 Other allergic rhinitis: Secondary | ICD-10-CM | POA: Diagnosis not present

## 2015-05-16 DIAGNOSIS — J3081 Allergic rhinitis due to animal (cat) (dog) hair and dander: Secondary | ICD-10-CM | POA: Diagnosis not present

## 2015-05-16 DIAGNOSIS — J3089 Other allergic rhinitis: Secondary | ICD-10-CM | POA: Diagnosis not present

## 2015-05-16 DIAGNOSIS — J301 Allergic rhinitis due to pollen: Secondary | ICD-10-CM | POA: Diagnosis not present

## 2015-05-24 DIAGNOSIS — J3089 Other allergic rhinitis: Secondary | ICD-10-CM | POA: Diagnosis not present

## 2015-05-24 DIAGNOSIS — J3081 Allergic rhinitis due to animal (cat) (dog) hair and dander: Secondary | ICD-10-CM | POA: Diagnosis not present

## 2015-05-24 DIAGNOSIS — J301 Allergic rhinitis due to pollen: Secondary | ICD-10-CM | POA: Diagnosis not present

## 2015-06-01 DIAGNOSIS — I129 Hypertensive chronic kidney disease with stage 1 through stage 4 chronic kidney disease, or unspecified chronic kidney disease: Secondary | ICD-10-CM | POA: Diagnosis not present

## 2015-06-01 DIAGNOSIS — J3081 Allergic rhinitis due to animal (cat) (dog) hair and dander: Secondary | ICD-10-CM | POA: Diagnosis not present

## 2015-06-01 DIAGNOSIS — N183 Chronic kidney disease, stage 3 (moderate): Secondary | ICD-10-CM | POA: Diagnosis not present

## 2015-06-01 DIAGNOSIS — J3089 Other allergic rhinitis: Secondary | ICD-10-CM | POA: Diagnosis not present

## 2015-06-01 DIAGNOSIS — H2000 Unspecified acute and subacute iridocyclitis: Secondary | ICD-10-CM | POA: Diagnosis not present

## 2015-06-01 DIAGNOSIS — Q613 Polycystic kidney, unspecified: Secondary | ICD-10-CM | POA: Diagnosis not present

## 2015-06-01 DIAGNOSIS — E785 Hyperlipidemia, unspecified: Secondary | ICD-10-CM | POA: Diagnosis not present

## 2015-06-01 DIAGNOSIS — J301 Allergic rhinitis due to pollen: Secondary | ICD-10-CM | POA: Diagnosis not present

## 2015-06-03 DIAGNOSIS — J3081 Allergic rhinitis due to animal (cat) (dog) hair and dander: Secondary | ICD-10-CM | POA: Diagnosis not present

## 2015-06-03 DIAGNOSIS — J3089 Other allergic rhinitis: Secondary | ICD-10-CM | POA: Diagnosis not present

## 2015-06-08 DIAGNOSIS — J3089 Other allergic rhinitis: Secondary | ICD-10-CM | POA: Diagnosis not present

## 2015-06-08 DIAGNOSIS — J301 Allergic rhinitis due to pollen: Secondary | ICD-10-CM | POA: Diagnosis not present

## 2015-06-08 DIAGNOSIS — J3081 Allergic rhinitis due to animal (cat) (dog) hair and dander: Secondary | ICD-10-CM | POA: Diagnosis not present

## 2015-06-16 DIAGNOSIS — J3081 Allergic rhinitis due to animal (cat) (dog) hair and dander: Secondary | ICD-10-CM | POA: Diagnosis not present

## 2015-06-16 DIAGNOSIS — J3089 Other allergic rhinitis: Secondary | ICD-10-CM | POA: Diagnosis not present

## 2015-06-16 DIAGNOSIS — J301 Allergic rhinitis due to pollen: Secondary | ICD-10-CM | POA: Diagnosis not present

## 2015-06-23 DIAGNOSIS — J3089 Other allergic rhinitis: Secondary | ICD-10-CM | POA: Diagnosis not present

## 2015-06-23 DIAGNOSIS — J3081 Allergic rhinitis due to animal (cat) (dog) hair and dander: Secondary | ICD-10-CM | POA: Diagnosis not present

## 2015-06-23 DIAGNOSIS — J301 Allergic rhinitis due to pollen: Secondary | ICD-10-CM | POA: Diagnosis not present

## 2015-07-01 DIAGNOSIS — J3081 Allergic rhinitis due to animal (cat) (dog) hair and dander: Secondary | ICD-10-CM | POA: Diagnosis not present

## 2015-07-01 DIAGNOSIS — J301 Allergic rhinitis due to pollen: Secondary | ICD-10-CM | POA: Diagnosis not present

## 2015-07-01 DIAGNOSIS — J3089 Other allergic rhinitis: Secondary | ICD-10-CM | POA: Diagnosis not present

## 2015-07-07 DIAGNOSIS — J3081 Allergic rhinitis due to animal (cat) (dog) hair and dander: Secondary | ICD-10-CM | POA: Diagnosis not present

## 2015-07-07 DIAGNOSIS — J3089 Other allergic rhinitis: Secondary | ICD-10-CM | POA: Diagnosis not present

## 2015-07-07 DIAGNOSIS — J301 Allergic rhinitis due to pollen: Secondary | ICD-10-CM | POA: Diagnosis not present

## 2015-07-13 DIAGNOSIS — J3089 Other allergic rhinitis: Secondary | ICD-10-CM | POA: Diagnosis not present

## 2015-07-13 DIAGNOSIS — J3081 Allergic rhinitis due to animal (cat) (dog) hair and dander: Secondary | ICD-10-CM | POA: Diagnosis not present

## 2015-07-13 DIAGNOSIS — J301 Allergic rhinitis due to pollen: Secondary | ICD-10-CM | POA: Diagnosis not present

## 2015-07-20 DIAGNOSIS — J301 Allergic rhinitis due to pollen: Secondary | ICD-10-CM | POA: Diagnosis not present

## 2015-07-20 DIAGNOSIS — J3081 Allergic rhinitis due to animal (cat) (dog) hair and dander: Secondary | ICD-10-CM | POA: Diagnosis not present

## 2015-07-20 DIAGNOSIS — J3089 Other allergic rhinitis: Secondary | ICD-10-CM | POA: Diagnosis not present

## 2015-07-28 DIAGNOSIS — J3089 Other allergic rhinitis: Secondary | ICD-10-CM | POA: Diagnosis not present

## 2015-07-28 DIAGNOSIS — J3081 Allergic rhinitis due to animal (cat) (dog) hair and dander: Secondary | ICD-10-CM | POA: Diagnosis not present

## 2015-07-28 DIAGNOSIS — J301 Allergic rhinitis due to pollen: Secondary | ICD-10-CM | POA: Diagnosis not present

## 2015-08-08 DIAGNOSIS — J301 Allergic rhinitis due to pollen: Secondary | ICD-10-CM | POA: Diagnosis not present

## 2015-08-08 DIAGNOSIS — J3089 Other allergic rhinitis: Secondary | ICD-10-CM | POA: Diagnosis not present

## 2015-08-08 DIAGNOSIS — J3081 Allergic rhinitis due to animal (cat) (dog) hair and dander: Secondary | ICD-10-CM | POA: Diagnosis not present

## 2015-08-11 ENCOUNTER — Encounter: Payer: Self-pay | Admitting: Internal Medicine

## 2015-08-11 ENCOUNTER — Ambulatory Visit (INDEPENDENT_AMBULATORY_CARE_PROVIDER_SITE_OTHER): Payer: Medicare Other | Admitting: Internal Medicine

## 2015-08-11 ENCOUNTER — Other Ambulatory Visit (INDEPENDENT_AMBULATORY_CARE_PROVIDER_SITE_OTHER): Payer: Medicare Other

## 2015-08-11 VITALS — BP 126/84 | HR 67 | Temp 97.6°F | Resp 16 | Ht 71.5 in | Wt 168.5 lb

## 2015-08-11 DIAGNOSIS — N401 Enlarged prostate with lower urinary tract symptoms: Secondary | ICD-10-CM | POA: Diagnosis not present

## 2015-08-11 DIAGNOSIS — E785 Hyperlipidemia, unspecified: Secondary | ICD-10-CM

## 2015-08-11 DIAGNOSIS — K219 Gastro-esophageal reflux disease without esophagitis: Secondary | ICD-10-CM | POA: Diagnosis not present

## 2015-08-11 DIAGNOSIS — R351 Nocturia: Secondary | ICD-10-CM

## 2015-08-11 DIAGNOSIS — I10 Essential (primary) hypertension: Secondary | ICD-10-CM | POA: Diagnosis not present

## 2015-08-11 DIAGNOSIS — A048 Other specified bacterial intestinal infections: Secondary | ICD-10-CM

## 2015-08-11 DIAGNOSIS — Q613 Polycystic kidney, unspecified: Secondary | ICD-10-CM

## 2015-08-11 DIAGNOSIS — D539 Nutritional anemia, unspecified: Secondary | ICD-10-CM

## 2015-08-11 DIAGNOSIS — Z0000 Encounter for general adult medical examination without abnormal findings: Secondary | ICD-10-CM

## 2015-08-11 DIAGNOSIS — B9681 Helicobacter pylori [H. pylori] as the cause of diseases classified elsewhere: Secondary | ICD-10-CM | POA: Diagnosis not present

## 2015-08-11 LAB — CBC WITH DIFFERENTIAL/PLATELET
BASOS ABS: 0 10*3/uL (ref 0.0–0.1)
Basophils Relative: 0.5 % (ref 0.0–3.0)
EOS ABS: 0.3 10*3/uL (ref 0.0–0.7)
EOS PCT: 5 % (ref 0.0–5.0)
HCT: 39.6 % (ref 39.0–52.0)
Hemoglobin: 13.2 g/dL (ref 13.0–17.0)
LYMPHS ABS: 1.8 10*3/uL (ref 0.7–4.0)
Lymphocytes Relative: 33 % (ref 12.0–46.0)
MCHC: 33.3 g/dL (ref 30.0–36.0)
MCV: 92.6 fl (ref 78.0–100.0)
MONO ABS: 0.7 10*3/uL (ref 0.1–1.0)
Monocytes Relative: 12.6 % — ABNORMAL HIGH (ref 3.0–12.0)
NEUTROS PCT: 48.9 % (ref 43.0–77.0)
Neutro Abs: 2.7 10*3/uL (ref 1.4–7.7)
Platelets: 144 10*3/uL — ABNORMAL LOW (ref 150.0–400.0)
RBC: 4.27 Mil/uL (ref 4.22–5.81)
RDW: 13.4 % (ref 11.5–15.5)
WBC: 5.4 10*3/uL (ref 4.0–10.5)

## 2015-08-11 LAB — URINALYSIS, ROUTINE W REFLEX MICROSCOPIC
Bilirubin Urine: NEGATIVE
KETONES UR: NEGATIVE
LEUKOCYTES UA: NEGATIVE
Nitrite: NEGATIVE
PH: 6 (ref 5.0–8.0)
RBC / HPF: NONE SEEN (ref 0–?)
Specific Gravity, Urine: <=1.005 — AB (ref 1.000–1.030)
TOTAL PROTEIN, URINE-UPE24: NEGATIVE
UROBILINOGEN UA: 0.2 (ref 0.0–1.0)
Urine Glucose: NEGATIVE

## 2015-08-11 LAB — LIPID PANEL
CHOLESTEROL: 177 mg/dL (ref 0–200)
HDL: 67.9 mg/dL (ref >39.00)
LDL Cholesterol: 99 mg/dL (ref 0–99)
NonHDL: 109.27
Total CHOL/HDL Ratio: 3
Triglycerides: 52 mg/dL (ref 0.0–149.0)
VLDL: 10.4 mg/dL (ref 0.0–40.0)

## 2015-08-11 LAB — IBC PANEL
IRON: 85 ug/dL (ref 42–165)
Saturation Ratios: 27.3 % (ref 20.0–50.0)
Transferrin: 222 mg/dL (ref 212.0–360.0)

## 2015-08-11 LAB — COMPREHENSIVE METABOLIC PANEL
ALBUMIN: 3.9 g/dL (ref 3.5–5.2)
ALK PHOS: 62 U/L (ref 39–117)
ALT: 12 U/L (ref 0–53)
AST: 17 U/L (ref 0–37)
BILIRUBIN TOTAL: 0.8 mg/dL (ref 0.2–1.2)
BUN: 26 mg/dL — AB (ref 6–23)
CO2: 30 mEq/L (ref 19–32)
CREATININE: 1.72 mg/dL — AB (ref 0.40–1.50)
Calcium: 9.2 mg/dL (ref 8.4–10.5)
Chloride: 103 mEq/L (ref 96–112)
GFR: 41.14 mL/min — ABNORMAL LOW (ref >60.00)
GLUCOSE: 83 mg/dL (ref 70–99)
Potassium: 3.8 mEq/L (ref 3.5–5.1)
Sodium: 139 mEq/L (ref 135–145)
TOTAL PROTEIN: 7 g/dL (ref 6.0–8.3)

## 2015-08-11 LAB — RETICULOCYTES
ABS Retic: 17040 cells/uL — ABNORMAL LOW (ref 25000–90000)
RBC.: 4.26 MIL/uL (ref 4.20–5.80)
Retic Ct Pct: 0.4 %

## 2015-08-11 LAB — FECAL OCCULT BLOOD, GUAIAC: FECAL OCCULT BLD: NEGATIVE

## 2015-08-11 LAB — FOLATE

## 2015-08-11 LAB — PSA: PSA: 2.28 ng/mL (ref 0.10–4.00)

## 2015-08-11 LAB — FERRITIN: FERRITIN: 35.8 ng/mL (ref 22.0–322.0)

## 2015-08-11 LAB — TSH: TSH: 2.35 u[IU]/mL (ref 0.35–4.50)

## 2015-08-11 MED ORDER — OMEPRAZOLE 40 MG PO CPDR: 40.0000 mg | DELAYED_RELEASE_CAPSULE | Freq: Every day | ORAL | Status: DC

## 2015-08-11 MED ORDER — FINASTERIDE 5 MG PO TABS: 5.0000 mg | ORAL_TABLET | Freq: Every day | ORAL | Status: DC

## 2015-08-11 NOTE — Progress Notes (Signed)
Pre visit review using our clinic review tool, if applicable. No additional management support is needed unless otherwise documented below in the visit note. 

## 2015-08-11 NOTE — Patient Instructions (Signed)

## 2015-08-11 NOTE — Progress Notes (Signed)
Subjective:  Patient ID: Henry Adams, male    DOB: 08-Nov-1940  Age: 75 y.o. MRN: [id number]  CC: Hypertension; Annual Exam; and Gastroesophageal Reflux   HPI Henry Adams presents for a CPX.  He complains of a worsening weak urine stream with nocturia and urinary straining. He previously saw a  urologist and was diagnosed with BPH and tried a peripheral alpha-blocker but his chart shows that he did not tolerate those very well.  He also wants to know if he can increase his omeprazole from 20 mg a day to 40 mg a day. He has persistent concerns with heartburn but he denies odynophagia or dysphagia. He tells me the 20 mg dose is not controlling his symptoms.  He reports his blood pressure is well-controlled on hydrochlorothiazide and telmisartan. He denies any recent episodes of headache/blurred vision/chest pain/shortness of breath/edema/palpitations/or fatigue.   Past Medical History:  Diagnosis Date  . Allergy    perennial  . CKD (chronic kidney disease)   . GERD (gastroesophageal reflux disease)   . HLD (hyperlipidemia)   . Hx of cardiovascular stress test    ETT-Myoview 6/14: probable low risk study, inf defect with normal inf wall motion-cannot r/o ischemia; prominent gut uptake adjacent to inf wall on rest images-poss defect is atten, EF 63%  . Hypertension   . Polycystic kidney disease    Past Surgical History:  Procedure Laterality Date  . COLONOSCOPY  2011   diverticulosis  . HERNIA REPAIR     umbilical  . KNEE SURGERY Left   . medialization laryngoplasty  10/15/2013   Dr Joya Gaskins, [facility] [address]  . TONSILLECTOMY AND ADENOIDECTOMY      reports that he has never smoked. He does not have any smokeless tobacco history on file. He reports that he drinks alcohol. He reports that he does not use drugs. family history includes Cancer in his daughter and sister; Heart disease in his brother; Hypertension in his father; Skin cancer in his brother; Stroke in his sister. Allergies    Allergen Reactions  . Iodine     rash  . Diazepam     Mental status changes   . Oxycodone     Mental status changes & nausea  . Pollen Extract Itching  . Tamsulosin     Other reaction(s): Other (See Comments) Unknown   . Doxazosin Rash    agitation  . Etodolac Rash    unknown  . Tape Rash    Certain "Band-Aids"    Outpatient Medications Prior to Visit  Medication Sig Dispense Refill  . aspirin 81 MG tablet Take 81 mg by mouth daily.      [name] azelastine (ASTELIN) 137 MCG/SPRAY nasal spray Place 1 spray into both nostrils 2 (two) times daily. Use in each nostril as directed    . fexofenadine (ALLEGRA) 180 MG tablet Take 180 mg by mouth daily.      [name] telmisartan (MICARDIS) 40 MG tablet Take 20 mg by mouth daily.     . vitamin C (ASCORBIC ACID) 500 MG tablet Take 1,000 mg by mouth daily.     [name] omeprazole (PRILOSEC) 40 MG capsule Take 20 mg by mouth daily.   2  . hydrochlorothiazide (HYDRODIURIL) 25 MG tablet Take 12.5 mg by mouth daily.      No facility-administered medications prior to visit.     ROS Review of Systems  Constitutional: Negative.  Negative for activity change, appetite change and fatigue.  HENT: Negative.  Negative for trouble swallowing.  Eyes: Negative.  Negative for visual disturbance.  Respiratory: Negative for cough, choking, chest tightness, shortness of breath and stridor.   Cardiovascular: Negative for chest pain, palpitations and leg swelling.  Gastrointestinal: Negative.  Negative for abdominal pain, constipation, diarrhea, nausea and vomiting.  Endocrine: Negative.   Genitourinary: Positive for difficulty urinating. Negative for decreased urine volume, dysuria, enuresis, flank pain, hematuria, penile pain, penile swelling, scrotal swelling and urgency.  Musculoskeletal: Negative for arthralgias, back pain, joint swelling, myalgias and neck pain.  Skin: Negative.   Allergic/Immunologic: Negative.   Neurological: Negative.  Negative for dizziness,  tremors, weakness, light-headedness, numbness and headaches.  Hematological: Negative.  Negative for adenopathy. Does not bruise/bleed easily.  Psychiatric/Behavioral: Negative.     Objective:  BP 126/84   Pulse 67   Temp 97.6 F (36.4 C) (Oral)   Resp 16   Ht 5' 11.5" (1.816 m)   Wt 168 lb 8 oz (76.4 kg)   SpO2 97%   BMI 23.17 kg/m    BP Readings from Last 3 Encounters:  08/11/15 126/84  04/19/15 140/82  04/04/15 130/80    Wt Readings from Last 3 Encounters:  08/11/15 168 lb 8 oz (76.4 kg)  04/19/15 170 lb (77.1 kg)  04/04/15 170 lb (77.1 kg)    Physical Exam  Constitutional: He is oriented to person, place, and time. No distress.  HENT:  Mouth/Throat: Oropharynx is clear and moist. No oropharyngeal exudate.  Eyes: Conjunctivae are normal. Right eye exhibits no discharge. Left eye exhibits no discharge. No scleral icterus.  Neck: Normal range of motion. Neck supple. No JVD present. No tracheal deviation present. No thyromegaly present.  Cardiovascular: Normal rate, regular rhythm, normal heart sounds and intact distal pulses.  Exam reveals no gallop and no friction rub.   No murmur heard. Pulmonary/Chest: Effort normal and breath sounds normal. No stridor. No respiratory distress. He has no wheezes. He has no rales. He exhibits no tenderness.  Abdominal: Soft. Bowel sounds are normal. He exhibits no distension and no mass. There is no tenderness. There is no rebound and no guarding. Hernia confirmed negative in the right inguinal area and confirmed negative in the left inguinal area.  Genitourinary: Testes normal and penis normal. Rectal exam shows internal hemorrhoid. Rectal exam shows no external hemorrhoid, no fissure, no mass, no tenderness and anal tone normal. Guaiac negative stool. Prostate is enlarged (3+ smooth symm BPH). Prostate is not tender. Right testis shows no mass, no swelling and no tenderness. Right testis is descended. Left testis shows no mass, no swelling  and no tenderness. Left testis is descended. Uncircumcised. No phimosis, paraphimosis, hypospadias, penile erythema or penile tenderness. No discharge found.  Musculoskeletal: Normal range of motion. He exhibits no edema, tenderness or deformity.  Lymphadenopathy:    He has no cervical adenopathy.       Right: No inguinal adenopathy present.       Left: No inguinal adenopathy present.  Neurological: He is oriented to person, place, and time.  Skin: Skin is warm. No rash noted. He is not diaphoretic. No erythema. No pallor.  Psychiatric: He has a normal mood and affect. His behavior is normal. Judgment and thought content normal.  Vitals reviewed.   Lab Results  Component Value Date   WBC 5.4 08/11/2015   HGB 13.2 08/11/2015   HCT 39.6 08/11/2015   PLT 144.0 (L) 08/11/2015   GLUCOSE 83 08/11/2015   CHOL 177 08/11/2015   TRIG 52.0 08/11/2015   HDL 67.90 08/11/2015  LDLDIRECT 131.7 07/02/2011   LDLCALC 99 08/11/2015   ALT 12 08/11/2015   AST 17 08/11/2015   NA 139 08/11/2015   K 3.8 08/11/2015   CL 103 08/11/2015   CREATININE 1.72 (H) 08/11/2015   BUN 26 (H) 08/11/2015   CO2 30 08/11/2015   TSH 2.35 08/11/2015   PSA 2.28 08/11/2015   INR 1.1 (H) 08/01/2012   HGBA1C 5.9 12/29/2009    Mr Brain Wo Contrast  04/25/2015  CLINICAL DATA:  Left ptosis.  Dysphonia.  Tremor.  Facial asymmetry. EXAM: MRI HEAD WITHOUT CONTRAST TECHNIQUE: Multiplanar, multiecho pulse sequences of the brain and surrounding structures were obtained without intravenous contrast. COMPARISON:  MRA head 07/08/2009 FINDINGS: Cerebral volume normal for age. Negative for hydrocephalus. Age related atrophy. Negative for acute infarct. Mild chronic microvascular ischemic changes in the white matter. Brainstem and cerebellum intact. Negative for intracranial hemorrhage.  Negative for mass or edema. Thin-sections were obtained through the orbits bilaterally without intravenous contrast. No orbital mass or edema. Cavernous  sinus is normal without mass lesion. Mild mucosal edema in the paranasal sinuses bilaterally. Small air-fluid level left maxillary sinus. IMPRESSION: Chronic microvascular ischemic change.  No acute infarct or mass. Negative for orbital lesion. Electronically Signed   By: Franchot Gallo M.D.   On: 04/25/2015 10:43    Assessment & Plan:   Drequan was seen today for hypertension, annual exam and gastroesophageal reflux.  Diagnoses and all orders for this visit:  Essential hypertension- His blood pressures well controlled, electrolytes and renal function are stable. -     Comprehensive metabolic panel; Future -     Urinalysis, Routine w reflex microscopic (not at [facility]); Future  Gastroesophageal reflux disease without esophagitis- I've given him permission to increase the PPI from 20 mg a day to 40 mg a day. -     omeprazole (PRILOSEC) 40 MG capsule; Take 1 capsule (40 mg total) by mouth daily. -     CBC with Differential/Platelet; Future  Helicobacter pylori infection -     omeprazole (PRILOSEC) 40 MG capsule; Take 1 capsule (40 mg total) by mouth daily.  Deficiency anemia- anemia has resolved and his vitamin levels are normal. -     CBC with Differential/Platelet; Future -     Ferritin; Future -     Folate; Future -     IBC panel; Future -     Reticulocytes; Future  Polycystic kidney- blood pressures well controlled, renal function is stable and there are no red blood cells in his urine. -     Comprehensive metabolic panel; Future -     Urinalysis, Routine w reflex microscopic (not at [facility]); Future  Hyperlipidemia with target LDL less than 160- his LDL is less than 100 and I do not think he would benefit from taking a statin. -     Lipid panel; Future -     TSH; Future  BPH associated with nocturia- his prostate gland is enlarging and his PSA is rising so I think he would benefit from a 5 alpha reductase inhibitor like finasteride, unfortunately he does not tolerate peripheral alpha  blockers. -     PSA; Future -     Urinalysis, Routine w reflex microscopic (not at [facility]); Future -     finasteride (PROSCAR) 5 MG tablet; Take 1 tablet (5 mg total) by mouth daily.  Routine general medical examination at a health care facility   I have changed Mr. Cusano omeprazole. I am also having him start  on finasteride. Additionally, I am having him maintain his vitamin C, fexofenadine, aspirin, azelastine, telmisartan, and hydrochlorothiazide.  Meds ordered this encounter  Medications  . hydrochlorothiazide (MICROZIDE) 12.5 MG capsule    Refill:  3  . omeprazole (PRILOSEC) 40 MG capsule    Sig: Take 1 capsule (40 mg total) by mouth daily.    Dispense:  90 capsule    Refill:  3  . finasteride (PROSCAR) 5 MG tablet    Sig: Take 1 tablet (5 mg total) by mouth daily.    Dispense:  90 tablet    Refill:  3   See AVS for instructions about healthy living and anticipatory guidance.  Follow-up: Return in about 6 months (around 02/11/2016).  Scarlette Calico, MD

## 2015-08-13 ENCOUNTER — Encounter: Payer: Self-pay | Admitting: Internal Medicine

## 2015-08-14 NOTE — Assessment & Plan Note (Signed)

## 2015-08-19 DIAGNOSIS — J3081 Allergic rhinitis due to animal (cat) (dog) hair and dander: Secondary | ICD-10-CM | POA: Diagnosis not present

## 2015-08-19 DIAGNOSIS — J301 Allergic rhinitis due to pollen: Secondary | ICD-10-CM | POA: Diagnosis not present

## 2015-08-19 DIAGNOSIS — J3089 Other allergic rhinitis: Secondary | ICD-10-CM | POA: Diagnosis not present

## 2015-08-23 DIAGNOSIS — J301 Allergic rhinitis due to pollen: Secondary | ICD-10-CM | POA: Diagnosis not present

## 2015-08-26 DIAGNOSIS — J3089 Other allergic rhinitis: Secondary | ICD-10-CM | POA: Diagnosis not present

## 2015-08-26 DIAGNOSIS — J3081 Allergic rhinitis due to animal (cat) (dog) hair and dander: Secondary | ICD-10-CM | POA: Diagnosis not present

## 2015-08-26 DIAGNOSIS — J301 Allergic rhinitis due to pollen: Secondary | ICD-10-CM | POA: Diagnosis not present

## 2015-09-05 DIAGNOSIS — J301 Allergic rhinitis due to pollen: Secondary | ICD-10-CM | POA: Diagnosis not present

## 2015-09-05 DIAGNOSIS — J3081 Allergic rhinitis due to animal (cat) (dog) hair and dander: Secondary | ICD-10-CM | POA: Diagnosis not present

## 2015-09-05 DIAGNOSIS — J3089 Other allergic rhinitis: Secondary | ICD-10-CM | POA: Diagnosis not present

## 2015-09-14 DIAGNOSIS — J3081 Allergic rhinitis due to animal (cat) (dog) hair and dander: Secondary | ICD-10-CM | POA: Diagnosis not present

## 2015-09-14 DIAGNOSIS — J301 Allergic rhinitis due to pollen: Secondary | ICD-10-CM | POA: Diagnosis not present

## 2015-09-14 DIAGNOSIS — J3089 Other allergic rhinitis: Secondary | ICD-10-CM | POA: Diagnosis not present

## 2015-09-23 DIAGNOSIS — J3081 Allergic rhinitis due to animal (cat) (dog) hair and dander: Secondary | ICD-10-CM | POA: Diagnosis not present

## 2015-09-23 DIAGNOSIS — J301 Allergic rhinitis due to pollen: Secondary | ICD-10-CM | POA: Diagnosis not present

## 2015-09-23 DIAGNOSIS — J3089 Other allergic rhinitis: Secondary | ICD-10-CM | POA: Diagnosis not present

## 2015-09-27 DIAGNOSIS — J301 Allergic rhinitis due to pollen: Secondary | ICD-10-CM | POA: Diagnosis not present

## 2015-09-27 DIAGNOSIS — J3081 Allergic rhinitis due to animal (cat) (dog) hair and dander: Secondary | ICD-10-CM | POA: Diagnosis not present

## 2015-09-27 DIAGNOSIS — J3089 Other allergic rhinitis: Secondary | ICD-10-CM | POA: Diagnosis not present

## 2015-09-30 DIAGNOSIS — J301 Allergic rhinitis due to pollen: Secondary | ICD-10-CM | POA: Diagnosis not present

## 2015-09-30 DIAGNOSIS — J3089 Other allergic rhinitis: Secondary | ICD-10-CM | POA: Diagnosis not present

## 2015-09-30 DIAGNOSIS — J3081 Allergic rhinitis due to animal (cat) (dog) hair and dander: Secondary | ICD-10-CM | POA: Diagnosis not present

## 2015-10-03 DIAGNOSIS — J3081 Allergic rhinitis due to animal (cat) (dog) hair and dander: Secondary | ICD-10-CM | POA: Diagnosis not present

## 2015-10-03 DIAGNOSIS — J3089 Other allergic rhinitis: Secondary | ICD-10-CM | POA: Diagnosis not present

## 2015-10-03 DIAGNOSIS — J301 Allergic rhinitis due to pollen: Secondary | ICD-10-CM | POA: Diagnosis not present

## 2015-10-07 DIAGNOSIS — J3081 Allergic rhinitis due to animal (cat) (dog) hair and dander: Secondary | ICD-10-CM | POA: Diagnosis not present

## 2015-10-07 DIAGNOSIS — J301 Allergic rhinitis due to pollen: Secondary | ICD-10-CM | POA: Diagnosis not present

## 2015-10-07 DIAGNOSIS — J3089 Other allergic rhinitis: Secondary | ICD-10-CM | POA: Diagnosis not present

## 2015-10-19 DIAGNOSIS — J3081 Allergic rhinitis due to animal (cat) (dog) hair and dander: Secondary | ICD-10-CM | POA: Diagnosis not present

## 2015-10-19 DIAGNOSIS — J301 Allergic rhinitis due to pollen: Secondary | ICD-10-CM | POA: Diagnosis not present

## 2015-10-19 DIAGNOSIS — J3089 Other allergic rhinitis: Secondary | ICD-10-CM | POA: Diagnosis not present

## 2015-10-25 DIAGNOSIS — J3081 Allergic rhinitis due to animal (cat) (dog) hair and dander: Secondary | ICD-10-CM | POA: Diagnosis not present

## 2015-10-25 DIAGNOSIS — J301 Allergic rhinitis due to pollen: Secondary | ICD-10-CM | POA: Diagnosis not present

## 2015-10-25 DIAGNOSIS — J3089 Other allergic rhinitis: Secondary | ICD-10-CM | POA: Diagnosis not present

## 2015-11-04 DIAGNOSIS — J301 Allergic rhinitis due to pollen: Secondary | ICD-10-CM | POA: Diagnosis not present

## 2015-11-04 DIAGNOSIS — J3089 Other allergic rhinitis: Secondary | ICD-10-CM | POA: Diagnosis not present

## 2015-11-15 DIAGNOSIS — J301 Allergic rhinitis due to pollen: Secondary | ICD-10-CM | POA: Diagnosis not present

## 2015-11-15 DIAGNOSIS — J3081 Allergic rhinitis due to animal (cat) (dog) hair and dander: Secondary | ICD-10-CM | POA: Diagnosis not present

## 2015-11-15 DIAGNOSIS — J3089 Other allergic rhinitis: Secondary | ICD-10-CM | POA: Diagnosis not present

## 2015-11-21 DIAGNOSIS — J301 Allergic rhinitis due to pollen: Secondary | ICD-10-CM | POA: Diagnosis not present

## 2015-11-21 DIAGNOSIS — J3081 Allergic rhinitis due to animal (cat) (dog) hair and dander: Secondary | ICD-10-CM | POA: Diagnosis not present

## 2015-11-21 DIAGNOSIS — J3089 Other allergic rhinitis: Secondary | ICD-10-CM | POA: Diagnosis not present

## 2015-11-27 DIAGNOSIS — Z23 Encounter for immunization: Secondary | ICD-10-CM | POA: Diagnosis not present

## 2015-11-28 DIAGNOSIS — J3089 Other allergic rhinitis: Secondary | ICD-10-CM | POA: Diagnosis not present

## 2015-11-28 DIAGNOSIS — J3081 Allergic rhinitis due to animal (cat) (dog) hair and dander: Secondary | ICD-10-CM | POA: Diagnosis not present

## 2015-11-28 DIAGNOSIS — J301 Allergic rhinitis due to pollen: Secondary | ICD-10-CM | POA: Diagnosis not present

## 2015-12-06 DIAGNOSIS — J3089 Other allergic rhinitis: Secondary | ICD-10-CM | POA: Diagnosis not present

## 2015-12-06 DIAGNOSIS — J3081 Allergic rhinitis due to animal (cat) (dog) hair and dander: Secondary | ICD-10-CM | POA: Diagnosis not present

## 2015-12-06 DIAGNOSIS — J301 Allergic rhinitis due to pollen: Secondary | ICD-10-CM | POA: Diagnosis not present

## 2015-12-14 DIAGNOSIS — J3089 Other allergic rhinitis: Secondary | ICD-10-CM | POA: Diagnosis not present

## 2015-12-14 DIAGNOSIS — J301 Allergic rhinitis due to pollen: Secondary | ICD-10-CM | POA: Diagnosis not present

## 2015-12-14 DIAGNOSIS — J3081 Allergic rhinitis due to animal (cat) (dog) hair and dander: Secondary | ICD-10-CM | POA: Diagnosis not present

## 2015-12-22 ENCOUNTER — Encounter: Payer: Self-pay | Admitting: Family

## 2015-12-22 ENCOUNTER — Ambulatory Visit (INDEPENDENT_AMBULATORY_CARE_PROVIDER_SITE_OTHER): Payer: Medicare Other | Admitting: Family

## 2015-12-22 VITALS — BP 130/86 | HR 82 | Temp 98.2°F | Resp 16 | Ht 71.5 in | Wt 176.0 lb

## 2015-12-22 DIAGNOSIS — J069 Acute upper respiratory infection, unspecified: Secondary | ICD-10-CM | POA: Diagnosis not present

## 2015-12-22 MED ORDER — AMOXICILLIN-POT CLAVULANATE 875-125 MG PO TABS: 1.0000 | ORAL_TABLET | Freq: Two times a day (BID) | ORAL | 0 refills | Status: DC

## 2015-12-22 NOTE — Patient Instructions (Addendum)
Thank you for choosing [facility].  SUMMARY AND INSTRUCTIONS:  It appears as though you have a viral infection.  Please continue with watchful waiting and if your symptoms continue to improve there will be no need for an antibiotics  Medication:  Your prescription(s) have been submitted to your pharmacy or been printed and provided for you. Please take as directed and contact our office if you believe you are having problem(s) with the medication(s) or have any questions.  Follow up:  If your symptoms worsen or fail to improve, please contact our office for further instruction, or in case of emergency go directly to the emergency room at the closest medical facility.    General Recommendations:    Please drink plenty of fluids.  Get plenty of rest   Sleep in humidified air  Use saline nasal sprays  Netti pot   OTC Medications:  Decongestants - helps relieve congestion   Flonase (generic fluticasone) or Nasacort (generic triamcinolone) - please make sure to use the "cross-over" technique at a 45 degree angle towards the opposite eye as opposed to straight up the nasal passageway.   Sudafed (generic pseudoephedrine - Note this is the one that is available behind the pharmacy counter); Products with phenylephrine (-PE) may also be used but is often not as effective as pseudoephedrine.   If you have HIGH BLOOD PRESSURE - Coricidin HBP; AVOID any product that is -D as this contains pseudoephedrine which may increase your blood pressure.  Afrin (oxymetazoline) every 6-8 hours for up to 3 days.   Allergies - helps relieve runny nose, itchy eyes and sneezing   Claritin (generic loratidine), Allegra (fexofenidine), or Zyrtec (generic cyrterizine) for runny nose. These medications should not cause drowsiness.  Note - Benadryl (generic diphenhydramine) may be used however may cause drowsiness  Cough -   Delsym or Robitussin (generic dextromethorphan)  Expectorants  - helps loosen mucus to ease removal   Mucinex (generic guaifenesin) as directed on the package.  Headaches / General Aches   Tylenol (generic acetaminophen) - DO NOT EXCEED 3 grams (3,000 mg) in a 24 hour time period  Advil/Motrin (generic ibuprofen)   Sore Throat -   Salt water gargle   Chloraseptic (generic benzocaine) spray or lozenges / Sucrets (generic dyclonine)

## 2015-12-22 NOTE — Progress Notes (Signed)
Subjective:    Patient ID: Henry Adams, male    DOB: 03-Apr-1940, 75 y.o.   MRN: [id number]  Chief Complaint  Patient presents with  . Nasal Congestion    x4 days, congestion, fever off and on, runny nose and eyes, cough    HPI:  Henry Adams is a 75 y.o. male who  has a past medical history of Allergy; CKD (chronic kidney disease); GERD (gastroesophageal reflux disease); HLD (hyperlipidemia); cardiovascular stress test; Hypertension; and Polycystic kidney disease. and presents today for an office visit.  This is a new problem. Associated symptoms of congestion, runny nose, cough, sinus pressure and on and off fevers have been going on for approximately 4 days. Fevers were subjective. Modifying factors include Tylenol which has helped a little with the symptoms. He is also prescribed Allegra and Azelastine. No recent antibiotics. No sick contracts.  Allergies  Allergen Reactions  . Iodine     rash  . Diazepam     Mental status changes   . Oxycodone     Mental status changes & nausea  . Pollen Extract Itching  . Tamsulosin     Other reaction(s): Other (See Comments) Unknown   . Doxazosin Rash    agitation  . Etodolac Rash    unknown  . Tape Rash    Certain "Band-Aids"     Outpatient Medications Prior to Visit  Medication Sig Dispense Refill  . aspirin 81 MG tablet Take 81 mg by mouth daily.      [name] azelastine (ASTELIN) 137 MCG/SPRAY nasal spray Place 1 spray into both nostrils 2 (two) times daily. Use in each nostril as directed    . fexofenadine (ALLEGRA) 180 MG tablet Take 180 mg by mouth daily.      . finasteride (PROSCAR) 5 MG tablet Take 1 tablet (5 mg total) by mouth daily. 90 tablet 3  . hydrochlorothiazide (MICROZIDE) 12.5 MG capsule   3  . omeprazole (PRILOSEC) 40 MG capsule Take 1 capsule (40 mg total) by mouth daily. 90 capsule 3  . telmisartan (MICARDIS) 40 MG tablet Take 20 mg by mouth daily.     . vitamin C (ASCORBIC ACID) 500 MG tablet Take 1,000 mg by  mouth daily.      No facility-administered medications prior to visit.      Review of Systems  Constitutional: Positive for fever. Negative for chills.  HENT: Positive for congestion and sinus pressure. Negative for ear pain and sore throat.   Eyes: Positive for itching.       No changes in vision.   Respiratory: Positive for cough. Negative for chest tightness and shortness of breath.   Neurological: Positive for headaches.      Objective:    BP 130/86 (BP Location: Left Arm, Patient Position: Sitting, Cuff Size: Normal)   Pulse 82   Temp 98.2 F (36.8 C) (Oral)   Resp 16   Ht 5' 11.5" (1.816 m)   Wt 176 lb (79.8 kg)   SpO2 96%   BMI 24.20 kg/m  Nursing note and vital signs reviewed.  Physical Exam  Constitutional: He is oriented to person, place, and time. He appears well-developed and well-nourished. No distress.  HENT:  Right Ear: Hearing, tympanic membrane, external ear and ear canal normal.  Left Ear: Hearing, tympanic membrane, external ear and ear canal normal.  Nose: Nose normal. Right sinus exhibits no maxillary sinus tenderness and no frontal sinus tenderness. Left sinus exhibits no maxillary sinus tenderness and no  frontal sinus tenderness.  Mouth/Throat: Uvula is midline, oropharynx is clear and moist and mucous membranes are normal.  Cardiovascular: Normal rate, regular rhythm, normal heart sounds and intact distal pulses.   Pulmonary/Chest: Effort normal and breath sounds normal. No respiratory distress. He has no wheezes. He has no rales. He exhibits no tenderness.  Neurological: He is alert and oriented to person, place, and time.  Skin: Skin is warm and dry.  Psychiatric: He has a normal mood and affect. His behavior is normal. Judgment and thought content normal.       Assessment & Plan:   Problem List Items Addressed This Visit      Respiratory   Acute upper respiratory infection - Primary    Symptoms and exam consistent with acute upper  respiratory infection most likely viral given improvements. Continue over-the-counter medications as needed for symptom relief and supportive care. If symptoms worsen written prescription for Augmentin provided initial actions for watchful waiting. Follow-up if symptoms worsen or do not improve.      Relevant Medications   amoxicillin-clavulanate (AUGMENTIN) 875-125 MG tablet       I am having Mr. Bulkley start on amoxicillin-clavulanate. I am also having him maintain his vitamin C, fexofenadine, aspirin, azelastine, telmisartan, hydrochlorothiazide, omeprazole, and finasteride.   Meds ordered this encounter  Medications  . amoxicillin-clavulanate (AUGMENTIN) 875-125 MG tablet    Sig: Take 1 tablet by mouth 2 (two) times daily.    Dispense:  20 tablet    Refill:  0    Order Specific Question:   Supervising Provider    Answer:   Pricilla Holm A [id number]     Follow-up: Return if symptoms worsen or fail to improve.  Mauricio Po, FNP

## 2015-12-22 NOTE — Assessment & Plan Note (Signed)
Symptoms and exam consistent with acute upper respiratory infection most likely viral given improvements. Continue over-the-counter medications as needed for symptom relief and supportive care. If symptoms worsen written prescription for Augmentin provided initial actions for watchful waiting. Follow-up if symptoms worsen or do not improve.

## 2016-01-04 DIAGNOSIS — J3089 Other allergic rhinitis: Secondary | ICD-10-CM | POA: Diagnosis not present

## 2016-01-04 DIAGNOSIS — J301 Allergic rhinitis due to pollen: Secondary | ICD-10-CM | POA: Diagnosis not present

## 2016-01-04 DIAGNOSIS — J3081 Allergic rhinitis due to animal (cat) (dog) hair and dander: Secondary | ICD-10-CM | POA: Diagnosis not present

## 2016-01-11 DIAGNOSIS — J3081 Allergic rhinitis due to animal (cat) (dog) hair and dander: Secondary | ICD-10-CM | POA: Diagnosis not present

## 2016-01-11 DIAGNOSIS — J301 Allergic rhinitis due to pollen: Secondary | ICD-10-CM | POA: Diagnosis not present

## 2016-01-11 DIAGNOSIS — J3089 Other allergic rhinitis: Secondary | ICD-10-CM | POA: Diagnosis not present

## 2016-02-01 DIAGNOSIS — J3089 Other allergic rhinitis: Secondary | ICD-10-CM | POA: Diagnosis not present

## 2016-02-01 DIAGNOSIS — J3081 Allergic rhinitis due to animal (cat) (dog) hair and dander: Secondary | ICD-10-CM | POA: Diagnosis not present

## 2016-02-01 DIAGNOSIS — J301 Allergic rhinitis due to pollen: Secondary | ICD-10-CM | POA: Diagnosis not present

## 2016-02-21 DIAGNOSIS — J301 Allergic rhinitis due to pollen: Secondary | ICD-10-CM | POA: Diagnosis not present

## 2016-02-21 DIAGNOSIS — J3089 Other allergic rhinitis: Secondary | ICD-10-CM | POA: Diagnosis not present

## 2016-02-21 DIAGNOSIS — J3081 Allergic rhinitis due to animal (cat) (dog) hair and dander: Secondary | ICD-10-CM | POA: Diagnosis not present

## 2016-03-09 DIAGNOSIS — J3081 Allergic rhinitis due to animal (cat) (dog) hair and dander: Secondary | ICD-10-CM | POA: Diagnosis not present

## 2016-03-09 DIAGNOSIS — J3089 Other allergic rhinitis: Secondary | ICD-10-CM | POA: Diagnosis not present

## 2016-03-09 DIAGNOSIS — J301 Allergic rhinitis due to pollen: Secondary | ICD-10-CM | POA: Diagnosis not present

## 2016-03-11 DIAGNOSIS — D649 Anemia, unspecified: Secondary | ICD-10-CM | POA: Diagnosis not present

## 2016-03-11 DIAGNOSIS — I10 Essential (primary) hypertension: Secondary | ICD-10-CM | POA: Insufficient documentation

## 2016-03-11 DIAGNOSIS — N289 Disorder of kidney and ureter, unspecified: Secondary | ICD-10-CM | POA: Insufficient documentation

## 2016-03-11 DIAGNOSIS — R600 Localized edema: Secondary | ICD-10-CM | POA: Diagnosis not present

## 2016-03-11 DIAGNOSIS — M79604 Pain in right leg: Secondary | ICD-10-CM | POA: Diagnosis not present

## 2016-03-12 ENCOUNTER — Emergency Department (HOSPITAL_COMMUNITY)
Admission: EM | Admit: 2016-03-12 | Discharge: 2016-03-12 | Disposition: A | Discharge status: Home / Self Care | Payer: Medicare Other | Attending: Emergency Medicine | Admitting: Emergency Medicine

## 2016-03-12 ENCOUNTER — Ambulatory Visit (HOSPITAL_BASED_OUTPATIENT_CLINIC_OR_DEPARTMENT_OTHER)
Admission: RE | Admit: 2016-03-12 | Discharge: 2016-03-12 | Disposition: A | Discharge status: Home / Self Care | Payer: Medicare Other | Source: Ambulatory Visit | Attending: Emergency Medicine | Admitting: Emergency Medicine

## 2016-03-12 ENCOUNTER — Other Ambulatory Visit (HOSPITAL_COMMUNITY): Payer: Self-pay | Admitting: Emergency Medicine

## 2016-03-12 ENCOUNTER — Encounter (HOSPITAL_COMMUNITY): Payer: Self-pay | Admitting: Emergency Medicine

## 2016-03-12 ENCOUNTER — Ambulatory Visit (HOSPITAL_COMMUNITY): Admission: RE | Admit: 2016-03-12 | Payer: Medicare Other | Source: Ambulatory Visit

## 2016-03-12 DIAGNOSIS — D649 Anemia, unspecified: Secondary | ICD-10-CM | POA: Diagnosis not present

## 2016-03-12 DIAGNOSIS — M79604 Pain in right leg: Secondary | ICD-10-CM

## 2016-03-12 DIAGNOSIS — M79661 Pain in right lower leg: Secondary | ICD-10-CM

## 2016-03-12 DIAGNOSIS — I10 Essential (primary) hypertension: Secondary | ICD-10-CM | POA: Diagnosis not present

## 2016-03-12 DIAGNOSIS — N289 Disorder of kidney and ureter, unspecified: Secondary | ICD-10-CM | POA: Diagnosis not present

## 2016-03-12 DIAGNOSIS — M7989 Other specified soft tissue disorders: Secondary | ICD-10-CM

## 2016-03-12 DIAGNOSIS — R600 Localized edema: Secondary | ICD-10-CM | POA: Diagnosis not present

## 2016-03-12 LAB — CBC WITH DIFFERENTIAL/PLATELET
Basophils Absolute: 0 10*3/uL (ref 0.0–0.1)
Basophils Relative: 0 %
EOS ABS: 0.4 10*3/uL (ref 0.0–0.7)
Eosinophils Relative: 5 %
HEMATOCRIT: 37.1 % — AB (ref 39.0–52.0)
HEMOGLOBIN: 12.3 g/dL — AB (ref 13.0–17.0)
LYMPHS ABS: 2.8 10*3/uL (ref 0.7–4.0)
Lymphocytes Relative: 40 %
MCH: 31.2 pg (ref 26.0–34.0)
MCHC: 33.2 g/dL (ref 30.0–36.0)
MCV: 94.2 fL (ref 78.0–100.0)
MONOS PCT: 12 %
Monocytes Absolute: 0.8 10*3/uL (ref 0.1–1.0)
NEUTROS ABS: 3.1 10*3/uL (ref 1.7–7.7)
NEUTROS PCT: 43 %
Platelets: 150 10*3/uL (ref 150–400)
RBC: 3.94 MIL/uL — AB (ref 4.22–5.81)
RDW: 13.4 % (ref 11.5–15.5)
WBC: 7 10*3/uL (ref 4.0–10.5)

## 2016-03-12 LAB — BASIC METABOLIC PANEL
ANION GAP: 9 (ref 5–15)
BUN: 29 mg/dL — ABNORMAL HIGH (ref 6–20)
CHLORIDE: 102 mmol/L (ref 101–111)
CO2: 26 mmol/L (ref 22–32)
CREATININE: 1.66 mg/dL — AB (ref 0.61–1.24)
Calcium: 8.9 mg/dL (ref 8.9–10.3)
GFR calc Af Amer: 44 mL/min — ABNORMAL LOW (ref >60)
GFR calc non Af Amer: 38 mL/min — ABNORMAL LOW (ref >60)
Glucose, Bld: 102 mg/dL — ABNORMAL HIGH (ref 65–99)
Potassium: 3.7 mmol/L (ref 3.5–5.1)
SODIUM: 137 mmol/L (ref 135–145)

## 2016-03-12 LAB — MAGNESIUM: MAGNESIUM: 1.8 mg/dL (ref 1.7–2.4)

## 2016-03-12 MED ORDER — POTASSIUM CHLORIDE CRYS ER 20 MEQ PO TBCR: 20.0000 meq | EXTENDED_RELEASE_TABLET | Freq: Every day | ORAL | 0 refills | Status: DC

## 2016-03-12 MED ORDER — POTASSIUM CHLORIDE CRYS ER 20 MEQ PO TBCR
40.0000 meq | EXTENDED_RELEASE_TABLET | Freq: Once | ORAL | Status: AC
  Administered 2016-03-12: 40 meq via ORAL
  Filled 2016-03-12: qty 2

## 2016-03-12 MED ORDER — RIVAROXABAN 15 MG PO TABS
15.0000 mg | ORAL_TABLET | Freq: Once | ORAL | Status: AC
  Administered 2016-03-12: 15 mg via ORAL
  Filled 2016-03-12: qty 1

## 2016-03-12 NOTE — ED Provider Notes (Signed)
[facility] DEPT Provider Note   CSN: [id number] Arrival date & time: 03/11/16  2353  By signing my name below, I, Oleh Genin, attest that this documentation has been prepared under the direction and in the presence of Delora Fuel, MD. Electronically Signed: Oleh Genin, Scribe. 03/12/16. 12:57 AM.   History   Chief Complaint Chief Complaint  Patient presents with  . Leg Pain  . Leg Swelling    HPI Henry Adams is a 76 y.o. male with history of CKD, HTN, and HLD who presents to the ED for evaluation of peripheral edema. This patient states that since 3 days ago he has developed R calf swelling with tenderness and occasional cramping. At interview, he denies any history of DVT/PE, recent long distance travel, or recent surgeries. He denies any chest pain or dyspnea. Pain is moderate, worse on palpation.   The history is provided by the patient. No language interpreter was used.    Past Medical History:  Diagnosis Date  . Allergy    perennial  . CKD (chronic kidney disease)   . GERD (gastroesophageal reflux disease)   . HLD (hyperlipidemia)   . Hx of cardiovascular stress test    ETT-Myoview 6/14: probable low risk study, inf defect with normal inf wall motion-cannot r/o ischemia; prominent gut uptake adjacent to inf wall on rest images-poss defect is atten, EF 63%  . Hypertension   . Polycystic kidney disease     Patient Active Problem List   Diagnosis Date Noted  . Acute upper respiratory infection 12/22/2015  . BPH associated with nocturia 08/11/2015  . Routine general medical examination at a health care facility 08/11/2015  . Coarse tremor 04/04/2015  . Vocal cord dysfunction 11/04/2013  . Essential hypertension 04/26/2007  . Allergic rhinitis 04/26/2007  . DEGENERATIVE JOINT DISEASE 04/26/2007  . G E R D 08/05/2006  . VERTIGO 08/05/2006  . Helicobacter pylori infection 05/30/2006  . Hyperlipidemia with target LDL less than 160 05/30/2006  .  Deficiency anemia 05/30/2006  . Polycystic kidney 05/30/2006  . Diverticulosis of large intestine 07/15/2001    Past Surgical History:  Procedure Laterality Date  . COLONOSCOPY  2011   diverticulosis  . HERNIA REPAIR     umbilical  . KNEE SURGERY Left   . medialization laryngoplasty  10/15/2013   Dr Joya G[facility]ins, [facility]  . TONSILLECTOMY AND ADENOIDECTOMY         Home Medications    Prior to Admission medications   Medication Sig Start Date End Date Taking? Authorizing Provider  amoxicillin-clavulanate (AUGMENTIN) 875-125 MG tablet Take 1 tablet by mouth 2 (two) times daily. 12/22/15   Golden Circle, FNP  aspirin 81 MG tablet Take 81 mg by mouth daily.      Historical Provider, MD  azelastine (ASTELIN) 137 MCG/SPRAY nasal spray Place 1 spray into both nostrils 2 (two) times daily. Use in each nostril as directed    Historical Provider, MD  fexofenadine (ALLEGRA) 180 MG tablet Take 180 mg by mouth daily.      Historical Provider, MD  finasteride (PROSCAR) 5 MG tablet Take 1 tablet (5 mg total) by mouth daily. 08/11/15   [name], MD  hydrochlorothiazide (MICROZIDE) 12.5 MG capsule  06/26/15   Historical Provider, MD  omeprazole (PRILOSEC) 40 MG capsule Take 1 capsule (40 mg total) by mouth daily. 08/11/15   [name], MD  telmisartan (MICARDIS) 40 MG tablet Take 20 mg by mouth daily.     Historical Provider, MD  vitamin C (ASCORBIC ACID) 500 MG tablet Take 1,000 mg by mouth daily.     Historical Provider, MD    Family History Family History  Problem Relation Age of Onset  . Hypertension Father   . Cancer Sister     breast  . Heart disease Brother     heart transplant  . Cancer Daughter     breast  . Stroke Sister     >43  . Skin cancer Brother     squamous cell    Social History Social History  Substance Use Topics  . Smoking status: Never Smoker  . Smokeless tobacco: Never Used  . Alcohol use 0.0 oz/week     Comment:  2-3 beers / month     Allergies     Iodine; Diazepam; Oxycodone; Pollen extract; Tamsulosin; Doxazosin; Etodolac; and Tape   Review of Systems Review of Systems  Respiratory: Negative for shortness of breath.   Cardiovascular: Positive for leg swelling. Negative for chest pain.  Musculoskeletal:       Calf pain.   All other systems reviewed and are negative.    Physical Exam Updated Vital Signs BP 142/88 (BP Location: Left Arm)   Pulse 73   Temp 97.8 F (36.6 C) (Oral)   Resp 17   Ht 5\' 11"  (1.803 m)   Wt 166 lb 5 oz (75.4 kg)   SpO2 100%   BMI 23.20 kg/m   Physical Exam  Constitutional: He is oriented to person, place, and time. He appears well-developed and well-nourished.  HENT:  Head: Normocephalic and atraumatic.  Eyes: EOM are normal. Pupils are equal, round, and reactive to light.  Neck: Normal range of motion. Neck supple. No JVD present.  Cardiovascular: Normal rate, regular rhythm and normal heart sounds.   No murmur heard. Pulmonary/Chest: Effort normal and breath sounds normal. He has no wheezes. He has no rales. He exhibits no tenderness.  Abdominal: Soft. Bowel sounds are normal. He exhibits no distension and no mass. There is no tenderness.  Musculoskeletal: Normal range of motion.  There is trace pretibial edema on right. Right calf is 1cm greater in circumference than left calf. Focal tenderness in the R calf to the medial and posterior aspects. No cord present. Negative Homan's sign.   Lymphadenopathy:    He has no cervical adenopathy.  Neurological: He is alert and oriented to person, place, and time. No cranial nerve deficit. He exhibits normal muscle tone. Coordination normal.  Skin: Skin is warm and dry. No rash noted.  Psychiatric: He has a normal mood and affect. His behavior is normal. Judgment and thought content normal.  Nursing note and vitals reviewed.    ED Treatments / Results  Labs (all labs ordered are listed, but only abnormal results are displayed) Labs Reviewed -  No data to display  EKG  EKG Interpretation None       Radiology No results found.  Procedures Procedures (including critical care time)  Medications Ordered in ED Medications - No data to display   Initial Impression / Assessment and Plan / ED Course  I have reviewed the triage vital signs and the nursing notes.  Pertinent labs & imaging results that were available during my care of the patient were reviewed by me and considered in my medical decision making (see chart for details).  Asymmetric leg swelling which is mildly concerning for DVT. Leg cramps inpatient on diuretics-need to rule out hypokalemia. Screening labs are obtained showing stable renal insufficiency. Potassium  is not actually low, but is borderline. He may benefit from having potassium higher. Is given a dose of K Doerr in the ED and is sent home with prescription for same. Outpatient venous Doppler is arranged for later today.  Final Clinical Impressions(s) / ED Diagnoses   Final diagnoses:  Leg pain, right  Renal insufficiency  Leg edema  Normochromic normocytic anemia    New Prescriptions New Prescriptions   POTASSIUM CHLORIDE SA (K-DUR,KLOR-CON) 20 MEQ TABLET    Take 1 tablet (20 mEq total) by mouth daily.   I personally performed the services described in this documentation, which was scribed in my presence. The recorded information has been reviewed and is accurate.      Delora Fuel, MD [id number]

## 2016-03-12 NOTE — ED Notes (Signed)
Pt complains of pain to the lower right leg.

## 2016-03-12 NOTE — ED Triage Notes (Signed)
C/o swelling to R lower leg over the past few days.  Reports R calf is sensitive to touch at times and has occasional cramps in R calf.  R ankle is swollen at this time.

## 2016-03-12 NOTE — Discharge Instructions (Signed)
Return in the morning for the vascular ultrasound. If it shows a blood clot is present, you will need to stay on blood thinners. If it does not show a blood clot, then you will need to be treated symptomatically.

## 2016-03-12 NOTE — Progress Notes (Signed)
VASCULAR LAB PRELIMINARY  PRELIMINARY  PRELIMINARY  PRELIMINARY  Right lower extremity venous duplex completed.    Preliminary report:  Right:  No evidence of DVT, superficial thrombosis, or Baker's cyst.  Johnwesley Lederman, RVS 03/12/2016, 11:36 AM

## 2016-03-16 DIAGNOSIS — J301 Allergic rhinitis due to pollen: Secondary | ICD-10-CM | POA: Diagnosis not present

## 2016-03-16 DIAGNOSIS — J3089 Other allergic rhinitis: Secondary | ICD-10-CM | POA: Diagnosis not present

## 2016-03-16 DIAGNOSIS — J3081 Allergic rhinitis due to animal (cat) (dog) hair and dander: Secondary | ICD-10-CM | POA: Diagnosis not present

## 2016-03-27 DIAGNOSIS — J3081 Allergic rhinitis due to animal (cat) (dog) hair and dander: Secondary | ICD-10-CM | POA: Diagnosis not present

## 2016-03-27 DIAGNOSIS — J3089 Other allergic rhinitis: Secondary | ICD-10-CM | POA: Diagnosis not present

## 2016-03-27 DIAGNOSIS — J301 Allergic rhinitis due to pollen: Secondary | ICD-10-CM | POA: Diagnosis not present

## 2016-03-28 DIAGNOSIS — I129 Hypertensive chronic kidney disease with stage 1 through stage 4 chronic kidney disease, or unspecified chronic kidney disease: Secondary | ICD-10-CM | POA: Diagnosis not present

## 2016-03-28 DIAGNOSIS — E785 Hyperlipidemia, unspecified: Secondary | ICD-10-CM | POA: Diagnosis not present

## 2016-03-28 DIAGNOSIS — Q613 Polycystic kidney, unspecified: Secondary | ICD-10-CM | POA: Diagnosis not present

## 2016-03-28 DIAGNOSIS — N183 Chronic kidney disease, stage 3 (moderate): Secondary | ICD-10-CM | POA: Diagnosis not present

## 2016-03-29 DIAGNOSIS — J301 Allergic rhinitis due to pollen: Secondary | ICD-10-CM | POA: Diagnosis not present

## 2016-04-10 DIAGNOSIS — N401 Enlarged prostate with lower urinary tract symptoms: Secondary | ICD-10-CM | POA: Diagnosis not present

## 2016-04-10 DIAGNOSIS — R351 Nocturia: Secondary | ICD-10-CM | POA: Diagnosis not present

## 2016-04-10 DIAGNOSIS — R350 Frequency of micturition: Secondary | ICD-10-CM | POA: Diagnosis not present

## 2016-05-03 DIAGNOSIS — J3081 Allergic rhinitis due to animal (cat) (dog) hair and dander: Secondary | ICD-10-CM | POA: Diagnosis not present

## 2016-05-03 DIAGNOSIS — J3089 Other allergic rhinitis: Secondary | ICD-10-CM | POA: Diagnosis not present

## 2016-05-03 DIAGNOSIS — J301 Allergic rhinitis due to pollen: Secondary | ICD-10-CM | POA: Diagnosis not present

## 2016-05-08 DIAGNOSIS — J3089 Other allergic rhinitis: Secondary | ICD-10-CM | POA: Diagnosis not present

## 2016-05-08 DIAGNOSIS — J301 Allergic rhinitis due to pollen: Secondary | ICD-10-CM | POA: Diagnosis not present

## 2016-05-08 DIAGNOSIS — J3081 Allergic rhinitis due to animal (cat) (dog) hair and dander: Secondary | ICD-10-CM | POA: Diagnosis not present

## 2016-05-14 DIAGNOSIS — J3081 Allergic rhinitis due to animal (cat) (dog) hair and dander: Secondary | ICD-10-CM | POA: Diagnosis not present

## 2016-05-14 DIAGNOSIS — J3089 Other allergic rhinitis: Secondary | ICD-10-CM | POA: Diagnosis not present

## 2016-05-14 DIAGNOSIS — J301 Allergic rhinitis due to pollen: Secondary | ICD-10-CM | POA: Diagnosis not present

## 2016-05-16 DIAGNOSIS — J3081 Allergic rhinitis due to animal (cat) (dog) hair and dander: Secondary | ICD-10-CM | POA: Diagnosis not present

## 2016-05-16 DIAGNOSIS — J3089 Other allergic rhinitis: Secondary | ICD-10-CM | POA: Diagnosis not present

## 2016-05-22 DIAGNOSIS — J3089 Other allergic rhinitis: Secondary | ICD-10-CM | POA: Diagnosis not present

## 2016-05-22 DIAGNOSIS — J3081 Allergic rhinitis due to animal (cat) (dog) hair and dander: Secondary | ICD-10-CM | POA: Diagnosis not present

## 2016-05-22 DIAGNOSIS — J301 Allergic rhinitis due to pollen: Secondary | ICD-10-CM | POA: Diagnosis not present

## 2016-05-30 DIAGNOSIS — J3081 Allergic rhinitis due to animal (cat) (dog) hair and dander: Secondary | ICD-10-CM | POA: Diagnosis not present

## 2016-05-30 DIAGNOSIS — J3089 Other allergic rhinitis: Secondary | ICD-10-CM | POA: Diagnosis not present

## 2016-05-30 DIAGNOSIS — J301 Allergic rhinitis due to pollen: Secondary | ICD-10-CM | POA: Diagnosis not present

## 2016-06-07 DIAGNOSIS — J301 Allergic rhinitis due to pollen: Secondary | ICD-10-CM | POA: Diagnosis not present

## 2016-06-07 DIAGNOSIS — J3089 Other allergic rhinitis: Secondary | ICD-10-CM | POA: Diagnosis not present

## 2016-06-21 DIAGNOSIS — J301 Allergic rhinitis due to pollen: Secondary | ICD-10-CM | POA: Diagnosis not present

## 2016-06-21 DIAGNOSIS — J3089 Other allergic rhinitis: Secondary | ICD-10-CM | POA: Diagnosis not present

## 2016-06-21 DIAGNOSIS — J3081 Allergic rhinitis due to animal (cat) (dog) hair and dander: Secondary | ICD-10-CM | POA: Diagnosis not present

## 2016-07-02 DIAGNOSIS — J301 Allergic rhinitis due to pollen: Secondary | ICD-10-CM | POA: Diagnosis not present

## 2016-07-02 DIAGNOSIS — J3089 Other allergic rhinitis: Secondary | ICD-10-CM | POA: Diagnosis not present

## 2016-07-02 DIAGNOSIS — J3081 Allergic rhinitis due to animal (cat) (dog) hair and dander: Secondary | ICD-10-CM | POA: Diagnosis not present

## 2016-07-11 DIAGNOSIS — J301 Allergic rhinitis due to pollen: Secondary | ICD-10-CM | POA: Diagnosis not present

## 2016-07-11 DIAGNOSIS — J3089 Other allergic rhinitis: Secondary | ICD-10-CM | POA: Diagnosis not present

## 2016-07-11 DIAGNOSIS — J3081 Allergic rhinitis due to animal (cat) (dog) hair and dander: Secondary | ICD-10-CM | POA: Diagnosis not present

## 2016-07-18 DIAGNOSIS — J3089 Other allergic rhinitis: Secondary | ICD-10-CM | POA: Diagnosis not present

## 2016-07-18 DIAGNOSIS — J3081 Allergic rhinitis due to animal (cat) (dog) hair and dander: Secondary | ICD-10-CM | POA: Diagnosis not present

## 2016-07-18 DIAGNOSIS — J301 Allergic rhinitis due to pollen: Secondary | ICD-10-CM | POA: Diagnosis not present

## 2016-07-23 DIAGNOSIS — J3081 Allergic rhinitis due to animal (cat) (dog) hair and dander: Secondary | ICD-10-CM | POA: Diagnosis not present

## 2016-07-23 DIAGNOSIS — J301 Allergic rhinitis due to pollen: Secondary | ICD-10-CM | POA: Diagnosis not present

## 2016-07-23 DIAGNOSIS — J3089 Other allergic rhinitis: Secondary | ICD-10-CM | POA: Diagnosis not present

## 2016-08-01 DIAGNOSIS — J301 Allergic rhinitis due to pollen: Secondary | ICD-10-CM | POA: Diagnosis not present

## 2016-08-01 DIAGNOSIS — J3089 Other allergic rhinitis: Secondary | ICD-10-CM | POA: Diagnosis not present

## 2016-08-01 DIAGNOSIS — J3081 Allergic rhinitis due to animal (cat) (dog) hair and dander: Secondary | ICD-10-CM | POA: Diagnosis not present

## 2016-08-06 DIAGNOSIS — J3089 Other allergic rhinitis: Secondary | ICD-10-CM | POA: Diagnosis not present

## 2016-08-06 DIAGNOSIS — J3081 Allergic rhinitis due to animal (cat) (dog) hair and dander: Secondary | ICD-10-CM | POA: Diagnosis not present

## 2016-08-06 DIAGNOSIS — J301 Allergic rhinitis due to pollen: Secondary | ICD-10-CM | POA: Diagnosis not present

## 2016-08-13 DIAGNOSIS — J301 Allergic rhinitis due to pollen: Secondary | ICD-10-CM | POA: Diagnosis not present

## 2016-08-13 DIAGNOSIS — J3089 Other allergic rhinitis: Secondary | ICD-10-CM | POA: Diagnosis not present

## 2016-08-13 DIAGNOSIS — J3081 Allergic rhinitis due to animal (cat) (dog) hair and dander: Secondary | ICD-10-CM | POA: Diagnosis not present

## 2016-08-24 DIAGNOSIS — J3089 Other allergic rhinitis: Secondary | ICD-10-CM | POA: Diagnosis not present

## 2016-08-24 DIAGNOSIS — J301 Allergic rhinitis due to pollen: Secondary | ICD-10-CM | POA: Diagnosis not present

## 2016-08-24 DIAGNOSIS — J3081 Allergic rhinitis due to animal (cat) (dog) hair and dander: Secondary | ICD-10-CM | POA: Diagnosis not present

## 2016-08-30 DIAGNOSIS — D1801 Hemangioma of skin and subcutaneous tissue: Secondary | ICD-10-CM | POA: Diagnosis not present

## 2016-08-30 DIAGNOSIS — L821 Other seborrheic keratosis: Secondary | ICD-10-CM | POA: Diagnosis not present

## 2016-08-30 DIAGNOSIS — D485 Neoplasm of uncertain behavior of skin: Secondary | ICD-10-CM | POA: Diagnosis not present

## 2016-08-30 DIAGNOSIS — D692 Other nonthrombocytopenic purpura: Secondary | ICD-10-CM | POA: Diagnosis not present

## 2016-08-30 DIAGNOSIS — C44212 Basal cell carcinoma of skin of right ear and external auricular canal: Secondary | ICD-10-CM | POA: Diagnosis not present

## 2016-09-10 DIAGNOSIS — Z85828 Personal history of other malignant neoplasm of skin: Secondary | ICD-10-CM | POA: Diagnosis not present

## 2016-09-10 DIAGNOSIS — C44212 Basal cell carcinoma of skin of right ear and external auricular canal: Secondary | ICD-10-CM | POA: Diagnosis not present

## 2016-09-27 DIAGNOSIS — J3089 Other allergic rhinitis: Secondary | ICD-10-CM | POA: Diagnosis not present

## 2016-09-27 DIAGNOSIS — J301 Allergic rhinitis due to pollen: Secondary | ICD-10-CM | POA: Diagnosis not present

## 2016-09-27 DIAGNOSIS — J3081 Allergic rhinitis due to animal (cat) (dog) hair and dander: Secondary | ICD-10-CM | POA: Diagnosis not present

## 2016-10-09 DIAGNOSIS — J301 Allergic rhinitis due to pollen: Secondary | ICD-10-CM | POA: Diagnosis not present

## 2016-10-09 DIAGNOSIS — J3081 Allergic rhinitis due to animal (cat) (dog) hair and dander: Secondary | ICD-10-CM | POA: Diagnosis not present

## 2016-10-09 DIAGNOSIS — J3089 Other allergic rhinitis: Secondary | ICD-10-CM | POA: Diagnosis not present

## 2016-10-18 DIAGNOSIS — J3089 Other allergic rhinitis: Secondary | ICD-10-CM | POA: Diagnosis not present

## 2016-10-18 DIAGNOSIS — J3081 Allergic rhinitis due to animal (cat) (dog) hair and dander: Secondary | ICD-10-CM | POA: Diagnosis not present

## 2016-10-18 DIAGNOSIS — J301 Allergic rhinitis due to pollen: Secondary | ICD-10-CM | POA: Diagnosis not present

## 2016-10-24 ENCOUNTER — Ambulatory Visit (INDEPENDENT_AMBULATORY_CARE_PROVIDER_SITE_OTHER): Payer: Medicare Other | Admitting: Internal Medicine

## 2016-10-24 ENCOUNTER — Encounter: Payer: Self-pay | Admitting: Internal Medicine

## 2016-10-24 VITALS — BP 112/76 | HR 96 | Temp 98.2°F | Ht 71.0 in | Wt 164.0 lb

## 2016-10-24 DIAGNOSIS — H9192 Unspecified hearing loss, left ear: Secondary | ICD-10-CM | POA: Insufficient documentation

## 2016-10-24 DIAGNOSIS — J3081 Allergic rhinitis due to animal (cat) (dog) hair and dander: Secondary | ICD-10-CM | POA: Diagnosis not present

## 2016-10-24 DIAGNOSIS — W57XXXA Bitten or stung by nonvenomous insect and other nonvenomous arthropods, initial encounter: Secondary | ICD-10-CM | POA: Diagnosis not present

## 2016-10-24 DIAGNOSIS — F329 Major depressive disorder, single episode, unspecified: Secondary | ICD-10-CM

## 2016-10-24 DIAGNOSIS — Z23 Encounter for immunization: Secondary | ICD-10-CM

## 2016-10-24 DIAGNOSIS — G4700 Insomnia, unspecified: Secondary | ICD-10-CM | POA: Diagnosis not present

## 2016-10-24 DIAGNOSIS — S1086XA Insect bite of other specified part of neck, initial encounter: Secondary | ICD-10-CM | POA: Diagnosis not present

## 2016-10-24 DIAGNOSIS — J3089 Other allergic rhinitis: Secondary | ICD-10-CM | POA: Diagnosis not present

## 2016-10-24 DIAGNOSIS — J301 Allergic rhinitis due to pollen: Secondary | ICD-10-CM | POA: Diagnosis not present

## 2016-10-24 DIAGNOSIS — F419 Anxiety disorder, unspecified: Secondary | ICD-10-CM | POA: Diagnosis not present

## 2016-10-24 MED ORDER — ZOLPIDEM TARTRATE 5 MG PO TABS: 5.0000 mg | ORAL_TABLET | Freq: Every evening | ORAL | 0 refills | Status: DC | PRN

## 2016-10-24 MED ORDER — DOXYCYCLINE HYCLATE 100 MG PO TABS: 100.0000 mg | ORAL_TABLET | Freq: Two times a day (BID) | ORAL | 0 refills | Status: DC

## 2016-10-24 MED ORDER — ESCITALOPRAM OXALATE 10 MG PO TABS: 10.0000 mg | ORAL_TABLET | Freq: Every day | ORAL | 11 refills | Status: DC

## 2016-10-24 NOTE — Assessment & Plan Note (Signed)
Mild to mod, for ambien 5 qhs prn,  to f/u any worsening symptoms or concerns

## 2016-10-24 NOTE — Assessment & Plan Note (Signed)
Mild to mod, for doxycycline course,  to f/u any worsening symptoms or concerns 

## 2016-10-24 NOTE — Progress Notes (Signed)
Subjective:    Patient ID: Henry Adams, male    DOB: 19-Oct-1940, 76 y.o.   MRN: [id number]  HPI  Here to c/o 1 wk difficulty sleeping and anxiety, just "gets a feeling in my body of pain or something" often starting at the right foot; right foot pain not worse with walking and no sore tenderness,  Right leg does not jerk  And no creepy crawling sensatiuons, just feels "pressure like" then the whole body feels funny in a way he had hard time characterizing except sort of shaky.  Also admits new 2-4 wks worsening depressive symptoms, but no suicidal ideation, or HI.  Due for flu shot.  Has a course tremor chronic. No worsening gait Did also have a right neck visible and removed tick bite, not sure how long attached, but has been "warm" for several days.  Has not had recent labs, plans to f/u with PCP soon.  Also has 1 wk reduced hearing - ? Wax again  Past Medical History:  Diagnosis Date  . Allergy    perennial  . CKD (chronic kidney disease)   . GERD (gastroesophageal reflux disease)   . HLD (hyperlipidemia)   . Hx of cardiovascular stress test    ETT-Myoview 6/14: probable low risk study, inf defect with normal inf wall motion-cannot r/o ischemia; prominent gut uptake adjacent to inf wall on rest images-poss defect is atten, EF 63%  . Hypertension   . Polycystic kidney disease    Past Surgical History:  Procedure Laterality Date  . COLONOSCOPY  2011   diverticulosis  . HERNIA REPAIR     umbilical  . KNEE SURGERY Left   . medialization laryngoplasty  10/15/2013   Dr Joya Gaskins, [facility]  . TONSILLECTOMY AND ADENOIDECTOMY      reports that he has never smoked. He has never used smokeless tobacco. He reports that he drinks alcohol. He reports that he does not use drugs. family history includes Cancer in his daughter and sister; Heart disease in his brother; Hypertension in his father; Skin cancer in his brother; Stroke in his sister. Allergies  Allergen Reactions  . Iodine     rash  .  Diazepam     Mental status changes   . Oxycodone     Mental status changes & nausea  . Pollen Extract Itching  . Tamsulosin     Other reaction(s): Other (See Comments) Unknown   . Doxazosin Rash    agitation  . Etodolac Rash    unknown  . Tape Rash    Certain "Band-Aids"   Current Outpatient Prescriptions on File Prior to Visit  Medication Sig Dispense Refill  . aspirin 81 MG tablet Take 81 mg by mouth daily.      [name] azelastine (ASTELIN) 137 MCG/SPRAY nasal spray Place 1 spray into both nostrils 2 (two) times daily. Use in each nostril as directed    . fexofenadine (ALLEGRA) 180 MG tablet Take 180 mg by mouth daily.      . finasteride (PROSCAR) 5 MG tablet Take 1 tablet (5 mg total) by mouth daily. 90 tablet 3  . hydrochlorothiazide (MICROZIDE) 12.5 MG capsule   3  . omeprazole (PRILOSEC) 40 MG capsule Take 1 capsule (40 mg total) by mouth daily. 90 capsule 3  . potassium chloride SA (K-DUR,KLOR-CON) 20 MEQ tablet Take 1 tablet (20 mEq total) by mouth daily. 15 tablet 0  . telmisartan (MICARDIS) 40 MG tablet Take 20 mg by mouth daily.     [name]  vitamin C (ASCORBIC ACID) 500 MG tablet Take 1,000 mg by mouth daily.      No current facility-administered medications on file prior to visit.    Review of Systems  Constitutional: Negative for other unusual diaphoresis or sweats HENT: Negative for ear discharge or swelling Eyes: Negative for other worsening visual disturbances Respiratory: Negative for stridor or other swelling  Gastrointestinal: Negative for worsening distension or other blood Genitourinary: Negative for retention or other urinary change Musculoskeletal: Negative for other MSK pain or swelling Skin: Negative for color change or other new lesions Neurological: Negative for worsening tremors and other numbness  Psychiatric/Behavioral: Negative for worsening agitation or other fatigue All other system neg  Per pt    Objective:   Physical Exam BP 112/76   Pulse 96    Temp 98.2 F (36.8 C) (Oral)   Ht 5\' 11"  (1.803 m)   Wt 164 lb (74.4 kg)   SpO2 99%   BMI 22.87 kg/m  VS noted,  Constitutional: Pt appears in NAD HENT: Head: NCAT.  Right Ear: External ear normal.  Left Ear: External ear normal.  Left ear was irrigated of wax impaction with improved though still improved hearing (wears bilat hearing aids) Eyes: . Pupils are equal, round, and reactive to light. Conjunctivae and EOM are normal Nose: without d/c or deformity Neck: Neck supple. Gross normal ROM Cardiovascular: Normal rate and regular rhythm.   Pulmonary/Chest: Effort normal and breath sounds without rales or wheezing.  Neurological: Pt is alert. At baseline orientation, motor grossly intact Skin: Skin is warm. No rashes,  no LE edema but has bite site right lower anterior neck with 1 cm area red, tender swelling, no other spotted rash Psychiatric: Pt behavior is normal without agitation , 1-2 + depressed affect and nervous with psychomotor retardation No other exam findings Lab Results  Component Value Date   WBC 7.0 03/12/2016   HGB 12.3 (L) 03/12/2016   HCT 37.1 (L) 03/12/2016   PLT 150 03/12/2016   GLUCOSE 102 (H) 03/12/2016   CHOL 177 08/11/2015   TRIG 52.0 08/11/2015   HDL 67.90 08/11/2015   LDLDIRECT 131.7 07/02/2011   LDLCALC 99 08/11/2015   ALT 12 08/11/2015   AST 17 08/11/2015   NA 137 03/12/2016   K 3.7 03/12/2016   CL 102 03/12/2016   CREATININE 1.66 (H) 03/12/2016   BUN 29 (H) 03/12/2016   CO2 26 03/12/2016   TSH 2.35 08/11/2015   PSA 2.28 08/11/2015   INR 1.1 (H) 08/01/2012   HGBA1C 5.9 12/29/2009       Assessment & Plan:

## 2016-10-24 NOTE — Assessment & Plan Note (Signed)
Improved with irrigation,  to f/u any worsening symptoms or concerns  

## 2016-10-24 NOTE — Assessment & Plan Note (Signed)
Ok for lexapro 10 qd, declines referral counseling or psychiatry, verified no SI or HI

## 2016-10-24 NOTE — Patient Instructions (Addendum)
Your left ear was irrigated of wax today  Please take all new medication as prescribed - the lexapro 10 mg per day for depression and nerves, the antibiotic (antibiotic) for the tick bite infectoin, as well as ambien 5 mg (low dose) as needed for sleep  Please continue all other medications as before, and refills have been done if requested.  Please have the pharmacy call with any other refills you may need.  Please keep your appointments with your specialists as you may have planned  Please return in 3 weeks with Dr Ronnald Ramp, or sooner if needed

## 2016-11-05 DIAGNOSIS — J3081 Allergic rhinitis due to animal (cat) (dog) hair and dander: Secondary | ICD-10-CM | POA: Diagnosis not present

## 2016-11-05 DIAGNOSIS — J301 Allergic rhinitis due to pollen: Secondary | ICD-10-CM | POA: Diagnosis not present

## 2016-11-05 DIAGNOSIS — J3089 Other allergic rhinitis: Secondary | ICD-10-CM | POA: Diagnosis not present

## 2016-11-14 ENCOUNTER — Ambulatory Visit (INDEPENDENT_AMBULATORY_CARE_PROVIDER_SITE_OTHER): Payer: Medicare Other | Admitting: Internal Medicine

## 2016-11-14 ENCOUNTER — Other Ambulatory Visit (INDEPENDENT_AMBULATORY_CARE_PROVIDER_SITE_OTHER): Payer: Medicare Other

## 2016-11-14 ENCOUNTER — Encounter: Payer: Self-pay | Admitting: Internal Medicine

## 2016-11-14 VITALS — BP 122/68 | HR 78 | Temp 97.7°F | Resp 16 | Ht 71.0 in | Wt 163.0 lb

## 2016-11-14 DIAGNOSIS — E785 Hyperlipidemia, unspecified: Secondary | ICD-10-CM

## 2016-11-14 DIAGNOSIS — F329 Major depressive disorder, single episode, unspecified: Secondary | ICD-10-CM

## 2016-11-14 DIAGNOSIS — F419 Anxiety disorder, unspecified: Secondary | ICD-10-CM | POA: Diagnosis not present

## 2016-11-14 DIAGNOSIS — Z23 Encounter for immunization: Secondary | ICD-10-CM

## 2016-11-14 DIAGNOSIS — D539 Nutritional anemia, unspecified: Secondary | ICD-10-CM

## 2016-11-14 DIAGNOSIS — S1086XD Insect bite of other specified part of neck, subsequent encounter: Secondary | ICD-10-CM | POA: Diagnosis not present

## 2016-11-14 DIAGNOSIS — I10 Essential (primary) hypertension: Secondary | ICD-10-CM

## 2016-11-14 DIAGNOSIS — W57XXXD Bitten or stung by nonvenomous insect and other nonvenomous arthropods, subsequent encounter: Secondary | ICD-10-CM

## 2016-11-14 DIAGNOSIS — F32A Depression, unspecified: Secondary | ICD-10-CM

## 2016-11-14 LAB — THYROID PANEL WITH TSH
Free Thyroxine Index: 2.2 (ref 1.4–3.8)
T3 UPTAKE: 34 % (ref 22–35)
T4 TOTAL: 6.4 ug/dL (ref 4.9–10.5)
TSH: 1.61 mIU/L (ref 0.40–4.50)

## 2016-11-14 LAB — VITAMIN B12: Vitamin B-12: 361 pg/mL (ref 211–911)

## 2016-11-14 LAB — COMPREHENSIVE METABOLIC PANEL
ALK PHOS: 63 U/L (ref 39–117)
ALT: 10 U/L (ref 0–53)
AST: 15 U/L (ref 0–37)
Albumin: 3.8 g/dL (ref 3.5–5.2)
BUN: 28 mg/dL — ABNORMAL HIGH (ref 6–23)
CHLORIDE: 101 meq/L (ref 96–112)
CO2: 31 meq/L (ref 19–32)
Calcium: 9 mg/dL (ref 8.4–10.5)
Creatinine, Ser: 1.7 mg/dL — ABNORMAL HIGH (ref 0.40–1.50)
GFR: 41.56 mL/min — AB (ref >60.00)
GLUCOSE: 87 mg/dL (ref 70–99)
POTASSIUM: 3.7 meq/L (ref 3.5–5.1)
Sodium: 138 mEq/L (ref 135–145)
Total Bilirubin: 0.9 mg/dL (ref 0.2–1.2)
Total Protein: 7 g/dL (ref 6.0–8.3)

## 2016-11-14 LAB — CBC WITH DIFFERENTIAL/PLATELET
Basophils Absolute: 0 10*3/uL (ref 0.0–0.1)
Basophils Relative: 0.7 % (ref 0.0–3.0)
EOS PCT: 3.6 % (ref 0.0–5.0)
Eosinophils Absolute: 0.2 10*3/uL (ref 0.0–0.7)
HCT: 39.9 % (ref 39.0–52.0)
Hemoglobin: 13.3 g/dL (ref 13.0–17.0)
LYMPHS ABS: 1.8 10*3/uL (ref 0.7–4.0)
Lymphocytes Relative: 29.9 % (ref 12.0–46.0)
MCHC: 33.2 g/dL (ref 30.0–36.0)
MCV: 94.5 fl (ref 78.0–100.0)
MONOS PCT: 12.7 % — AB (ref 3.0–12.0)
Monocytes Absolute: 0.8 10*3/uL (ref 0.1–1.0)
NEUTROS ABS: 3.2 10*3/uL (ref 1.4–7.7)
NEUTROS PCT: 53.1 % (ref 43.0–77.0)
PLATELETS: 151 10*3/uL (ref 150.0–400.0)
RBC: 4.22 Mil/uL (ref 4.22–5.81)
RDW: 13.5 % (ref 11.5–15.5)
WBC: 6.1 10*3/uL (ref 4.0–10.5)

## 2016-11-14 LAB — LIPID PANEL
CHOL/HDL RATIO: 3
Cholesterol: 179 mg/dL (ref 0–200)
HDL: 68.5 mg/dL (ref >39.00)
LDL Cholesterol: 96 mg/dL (ref 0–99)
NONHDL: 110.91
Triglycerides: 73 mg/dL (ref 0.0–149.0)
VLDL: 14.6 mg/dL (ref 0.0–40.0)

## 2016-11-14 LAB — IBC PANEL
Iron: 133 ug/dL (ref 42–165)
Saturation Ratios: 44.2 % (ref 20.0–50.0)
Transferrin: 215 mg/dL (ref 212.0–360.0)

## 2016-11-14 LAB — FOLATE: Folate: 17.7 ng/mL (ref >5.9)

## 2016-11-14 LAB — FERRITIN: FERRITIN: 55.5 ng/mL (ref 22.0–322.0)

## 2016-11-14 NOTE — Progress Notes (Signed)
Subjective:  Patient ID: Henry Adams, male    DOB: 06-17-1940  Age: 76 y.o. MRN: [id number]  CC: Anemia; Hypertension; and Hyperlipidemia   HPI TRAYLEN ECKELS presents for f/up - his history is difficult to collect in light of his age-related dementia.  He tells me he has been feeling poorly for about 10 months.  He complains of dizziness, feeling nauseous, feeling off balance, and feeling depressed and anxious.  He was recently seen by another provider and Lexapro was started.  He thinks it is helping.  He also tells me that sometime in the last few months he had a tick bite on the right side of his neck.  It did not bother him initially.  A few weeks ago he noticed a red spot on the right side of his neck at the site of the previous tick bite and when he saw the other provider he was prescribed a course of doxycycline but he does not think it has helped much.  It does not look like he has been tested for Lyme disease.  Outpatient Medications Prior to Visit  Medication Sig Dispense Refill  . aspirin 81 MG tablet Take 81 mg by mouth daily.      [name] azelastine (ASTELIN) 137 MCG/SPRAY nasal spray Place 1 spray into both nostrils 2 (two) times daily. Use in each nostril as directed    . escitalopram (LEXAPRO) 10 MG tablet Take 1 tablet (10 mg total) by mouth daily. 30 tablet 11  . fexofenadine (ALLEGRA) 180 MG tablet Take 180 mg by mouth daily.      . finasteride (PROSCAR) 5 MG tablet Take 1 tablet (5 mg total) by mouth daily. 90 tablet 3  . hydrochlorothiazide (MICROZIDE) 12.5 MG capsule   3  . omeprazole (PRILOSEC) 40 MG capsule Take 1 capsule (40 mg total) by mouth daily. 90 capsule 3  . potassium chloride SA (K-DUR,KLOR-CON) 20 MEQ tablet Take 1 tablet (20 mEq total) by mouth daily. 15 tablet 0  . telmisartan (MICARDIS) 40 MG tablet Take 20 mg by mouth daily.     . vitamin C (ASCORBIC ACID) 500 MG tablet Take 1,000 mg by mouth daily.     [name] zolpidem (AMBIEN) 5 MG tablet Take 1 tablet (5 mg  total) by mouth at bedtime as needed for sleep. 30 tablet 0  . doxycycline (VIBRA-TABS) 100 MG tablet Take 1 tablet (100 mg total) by mouth 2 (two) times daily. 20 tablet 0   No facility-administered medications prior to visit.     ROS Review of Systems  Constitutional: Positive for fatigue. Negative for chills, diaphoresis, fever and unexpected weight change.  HENT: Negative.  Negative for trouble swallowing.   Eyes: Negative for visual disturbance.  Respiratory: Negative for cough, chest tightness, shortness of breath and wheezing.   Cardiovascular: Negative for chest pain, palpitations and leg swelling.  Gastrointestinal: Negative for abdominal pain, constipation, diarrhea, nausea and vomiting.  Endocrine: Negative.   Genitourinary: Negative.  Negative for difficulty urinating.  Musculoskeletal: Negative.  Negative for arthralgias, back pain, myalgias and neck pain.  Skin: Negative.  Negative for color change and rash.  Allergic/Immunologic: Negative.   Neurological: Positive for dizziness, syncope and light-headedness. Negative for weakness, numbness and headaches.       He fainted once about 3 weeks ago but has not fainted again since then.  Hematological: Negative for adenopathy. Does not bruise/bleed easily.  Psychiatric/Behavioral: Positive for dysphoric mood and sleep disturbance. Negative for behavioral problems, confusion,  decreased concentration, hallucinations, self-injury and suicidal ideas. The patient is nervous/anxious. The patient is not hyperactive.     Objective:  BP 122/68 (BP Location: Left Arm, Patient Position: Sitting, Cuff Size: Normal)   Pulse 78   Temp 97.7 F (36.5 C) (Oral)   Resp 16   Ht 5\' 11"  (1.803 m)   Wt 163 lb (73.9 kg)   SpO2 99%   BMI 22.73 kg/m   BP Readings from Last 3 Encounters:  11/14/16 122/68  10/24/16 112/76  03/12/16 155/99    Wt Readings from Last 3 Encounters:  11/14/16 163 lb (73.9 kg)  10/24/16 164 lb (74.4 kg)    03/11/16 166 lb 5 oz (75.4 kg)    Physical Exam  Constitutional: He is oriented to person, place, and time. No distress.  HENT:  Mouth/Throat: Oropharynx is clear and moist. No oropharyngeal exudate.  Eyes: Conjunctivae are normal. Right eye exhibits no discharge. Left eye exhibits no discharge. No scleral icterus.  Neck: Normal range of motion. Neck supple. No JVD present. No thyromegaly present.  Cardiovascular: Normal rate, regular rhythm and intact distal pulses.  Exam reveals no gallop and no friction rub.   No murmur heard. Pulmonary/Chest: Effort normal and breath sounds normal. No respiratory distress. He has no wheezes. He has no rales. He exhibits no tenderness.  Abdominal: Soft. Bowel sounds are normal. He exhibits no distension and no mass. There is no tenderness. There is no rebound and no guarding.  Musculoskeletal: Normal range of motion. He exhibits no edema, tenderness or deformity.  Lymphadenopathy:    He has no cervical adenopathy.  Neurological: He is alert and oriented to person, place, and time.  Skin: Skin is warm and dry. No rash noted. He is not diaphoretic. No erythema. No pallor.  Psychiatric: His speech is normal and behavior is normal. Judgment and thought content normal. His mood appears anxious. Cognition and memory are normal. He does not exhibit a depressed mood. He expresses no homicidal and no suicidal ideation. He expresses no suicidal plans and no homicidal plans.  Vitals reviewed.   Lab Results  Component Value Date   WBC 6.1 11/14/2016   HGB 13.3 11/14/2016   HCT 39.9 11/14/2016   PLT 151.0 11/14/2016   GLUCOSE 87 11/14/2016   CHOL 179 11/14/2016   TRIG 73.0 11/14/2016   HDL 68.50 11/14/2016   LDLDIRECT 131.7 07/02/2011   LDLCALC 96 11/14/2016   ALT 10 11/14/2016   AST 15 11/14/2016   NA 138 11/14/2016   K 3.7 11/14/2016   CL 101 11/14/2016   CREATININE 1.70 (H) 11/14/2016   BUN 28 (H) 11/14/2016   CO2 31 11/14/2016   TSH 1.61  11/14/2016   PSA 2.28 08/11/2015   INR 1.1 (H) 08/01/2012   HGBA1C 5.9 12/29/2009    No results found.  Assessment & Plan:   Omarius was seen today for anemia, hypertension and hyperlipidemia.  Diagnoses and all orders for this visit:  Need for influenza vaccination -     Cancel: Flu Vaccine QUAD 6+ mos PF IM (Fluarix Quad PF)  Essential hypertension- his blood pressure is well controlled.  Electrolytes and renal function are normal. -     Comprehensive metabolic panel; Future -     Thyroid Panel With TSH; Future  Deficiency anemia-his H&H are normal now.  His vitamin levels are normal.  We will continue to monitor this. -     CBC with Differential/Platelet; Future -     IBC panel;  Future -     Vitamin B12; Future -     Folate; Future -     Ferritin; Future  Hyperlipidemia with target LDL less than 160- his ASCVD risk score is elevated at 28%.  I have asked him to start a statin for CV risk reduction. -     Lipid panel; Future  Tick bite, subsequent encounter- testing for Lyme disease is negative.  I do not think his symptoms are related to tick borne illness. -     Lyme Ab/Western Blot Reflex; Future  Anxiety and depression- I think this is the main driver of his symptoms.  I encouraged him to stay on Lexapro at the current dose and over the next few weeks and months we will consider a dose increase.   I am having Mr. Adelson start on rosuvastatin. I am also having him maintain his vitamin C, fexofenadine, aspirin, azelastine, telmisartan, hydrochlorothiazide, omeprazole, finasteride, potassium chloride SA, escitalopram, zolpidem, and doxycycline.  Meds ordered this encounter  Medications  . doxycycline (VIBRA-TABS) 100 MG tablet    Sig: Take 100 mg by mouth 2 (two) times daily.  . rosuvastatin (CRESTOR) 5 MG tablet    Sig: Take 1 tablet (5 mg total) by mouth daily.    Dispense:  90 tablet    Refill:  1     Follow-up: Return in about 3 weeks (around  12/05/2016).  Scarlette Calico, MD

## 2016-11-14 NOTE — Patient Instructions (Signed)
Anemia, Nonspecific Anemia is a condition in which the concentration of red blood cells or hemoglobin in the blood is below normal. Hemoglobin is a substance in red blood cells that carries oxygen to the tissues of the body. Anemia results in not enough oxygen reaching these tissues. What are the causes? Common causes of anemia include:  Excessive bleeding. Bleeding may be internal or external. This includes excessive bleeding from periods (in women) or from the intestine.  Poor nutrition.  Chronic kidney, thyroid, and liver disease.  Bone marrow disorders that decrease red blood cell production.  Cancer and treatments for cancer.  HIV, AIDS, and their treatments.  Spleen problems that increase red blood cell destruction.  Blood disorders.  Excess destruction of red blood cells due to infection, medicines, and autoimmune disorders. What are the signs or symptoms?  Minor weakness.  Dizziness.  Headache.  Palpitations.  Shortness of breath, especially with exercise.  Paleness.  Cold sensitivity.  Indigestion.  Nausea.  Difficulty sleeping.  Difficulty concentrating. Symptoms may occur suddenly or they may develop slowly. How is this diagnosed? Additional blood tests are often needed. These help your health care provider determine the best treatment. Your health care provider will check your stool for blood and look for other causes of blood loss. How is this treated? Treatment varies depending on the cause of the anemia. Treatment can include:  Supplements of iron, vitamin B12, or folic acid.  Hormone medicines.  A blood transfusion. This may be needed if blood loss is severe.  Hospitalization. This may be needed if there is significant continual blood loss.  Dietary changes.  Spleen removal. Follow these instructions at home: Keep all follow-up appointments. It often takes many weeks to correct anemia, and having your health care provider check on your  condition and your response to treatment is very important. Get help right away if:  You develop extreme weakness, shortness of breath, or chest pain.  You become dizzy or have trouble concentrating.  You develop heavy vaginal bleeding.  You develop a rash.  You have bloody or black, tarry stools.  You faint.  You vomit up blood.  You vomit repeatedly.  You have abdominal pain.  You have a fever or persistent symptoms for more than 2-3 days.  You have a fever and your symptoms suddenly get worse.  You are dehydrated. This information is not intended to replace advice given to you by your health care provider. Make sure you discuss any questions you have with your health care provider. Document Released: 02/16/2004 Document Revised: 06/22/2015 Document Reviewed: 07/04/2012 Elsevier Interactive Patient Education  2017 [employer] Inc[employer]  

## 2016-11-15 ENCOUNTER — Encounter: Payer: Self-pay | Admitting: Internal Medicine

## 2016-11-15 LAB — LYME AB/WESTERN BLOT REFLEX: LYME DISEASE AB, QUANT, IGM: <0.8 index (ref 0.00–0.79)

## 2016-11-17 MED ORDER — ROSUVASTATIN CALCIUM 5 MG PO TABS: 5.0000 mg | ORAL_TABLET | Freq: Every day | ORAL | 1 refills | Status: DC

## 2016-11-20 DIAGNOSIS — J3089 Other allergic rhinitis: Secondary | ICD-10-CM | POA: Diagnosis not present

## 2016-11-20 DIAGNOSIS — J3081 Allergic rhinitis due to animal (cat) (dog) hair and dander: Secondary | ICD-10-CM | POA: Diagnosis not present

## 2016-11-20 DIAGNOSIS — J301 Allergic rhinitis due to pollen: Secondary | ICD-10-CM | POA: Diagnosis not present

## 2016-11-27 DIAGNOSIS — N183 Chronic kidney disease, stage 3 (moderate): Secondary | ICD-10-CM | POA: Diagnosis not present

## 2016-12-04 DIAGNOSIS — J3089 Other allergic rhinitis: Secondary | ICD-10-CM | POA: Diagnosis not present

## 2016-12-04 DIAGNOSIS — J3081 Allergic rhinitis due to animal (cat) (dog) hair and dander: Secondary | ICD-10-CM | POA: Diagnosis not present

## 2016-12-04 DIAGNOSIS — J301 Allergic rhinitis due to pollen: Secondary | ICD-10-CM | POA: Diagnosis not present

## 2016-12-05 ENCOUNTER — Encounter: Payer: Self-pay | Admitting: Internal Medicine

## 2016-12-05 ENCOUNTER — Ambulatory Visit: Payer: Medicare Other | Admitting: Internal Medicine

## 2016-12-05 VITALS — BP 116/74 | HR 73 | Temp 97.9°F | Ht 71.0 in | Wt 163.1 lb

## 2016-12-05 DIAGNOSIS — F329 Major depressive disorder, single episode, unspecified: Secondary | ICD-10-CM | POA: Diagnosis not present

## 2016-12-05 DIAGNOSIS — N183 Chronic kidney disease, stage 3 (moderate): Secondary | ICD-10-CM | POA: Diagnosis not present

## 2016-12-05 DIAGNOSIS — I10 Essential (primary) hypertension: Secondary | ICD-10-CM

## 2016-12-05 DIAGNOSIS — Q613 Polycystic kidney, unspecified: Secondary | ICD-10-CM | POA: Diagnosis not present

## 2016-12-05 DIAGNOSIS — I129 Hypertensive chronic kidney disease with stage 1 through stage 4 chronic kidney disease, or unspecified chronic kidney disease: Secondary | ICD-10-CM | POA: Diagnosis not present

## 2016-12-05 DIAGNOSIS — F32A Depression, unspecified: Secondary | ICD-10-CM

## 2016-12-05 DIAGNOSIS — F419 Anxiety disorder, unspecified: Secondary | ICD-10-CM | POA: Diagnosis not present

## 2016-12-05 DIAGNOSIS — E785 Hyperlipidemia, unspecified: Secondary | ICD-10-CM | POA: Diagnosis not present

## 2016-12-05 MED ORDER — ESCITALOPRAM OXALATE 20 MG PO TABS: 20.0000 mg | ORAL_TABLET | Freq: Every day | ORAL | 1 refills | Status: DC

## 2016-12-05 NOTE — Patient Instructions (Signed)
Major Depressive Disorder, Adult Major depressive disorder (MDD) is a mental health condition. It may also be called clinical depression or unipolar depression. MDD usually causes feelings of sadness, hopelessness, or helplessness. MDD can also cause physical symptoms. It can interfere with work, school, relationships, and other everyday activities. MDD may be mild, moderate, or severe. It may occur once (single episode major depressive disorder) or it may occur multiple times (recurrent major depressive disorder). What are the causes? The exact cause of this condition is not known. MDD is most likely caused by a combination of things, which may include:  Genetic factors. These are traits that are passed along from parent to child.  Individual factors. Your personality, your behavior, and the way you handle your thoughts and feelings may contribute to MDD. This includes personality traits and behaviors learned from others.  Physical factors, such as: ? Differences in the part of your brain that controls emotion. This part of your brain may be different than it is in people who do not have MDD. ? Long-term (chronic) medical or psychiatric illnesses.  Social factors. Traumatic experiences or major life changes may play a role in the development of MDD.  What increases the risk? This condition is more likely to develop in women. The following factors may also make you more likely to develop MDD:  A family history of depression.  Troubled family relationships.  Abnormally low levels of certain brain chemicals.  Traumatic events in childhood, especially abuse or the loss of a parent.  Being under a lot of stress, or long-term stress, especially from upsetting life experiences or losses.  A history of: ? Chronic physical illness. ? Other mental health disorders. ? Substance abuse.  Poor living conditions.  Experiencing social exclusion or discrimination on a regular basis.  What are  the signs or symptoms? The main symptoms of MDD typically include:  Constant depressed or irritable mood.  Loss of interest in things and activities.  MDD symptoms may also include:  Sleeping or eating too much or too little.  Unexplained weight change.  Fatigue or low energy.  Feelings of worthlessness or guilt.  Difficulty thinking clearly or making decisions.  Thoughts of suicide or of harming others.  Physical agitation or weakness.  Isolation.  Severe cases of MDD may also occur with other symptoms, such as:  Delusions or hallucinations, in which you imagine things that are not real (psychotic depression).  Low-level depression that lasts at least a year (chronic depression or persistent depressive disorder).  Extreme sadness and hopelessness (melancholic depression).  Trouble speaking and moving (catatonic depression).  How is this diagnosed? This condition may be diagnosed based on:  Your symptoms.  Your medical history, including your mental health history. This may involve tests to evaluate your mental health. You may be asked questions about your lifestyle, including any drug and alcohol use, and how long you have had symptoms of MDD.  A physical exam.  Blood tests to rule out other conditions.  You must have a depressed mood and at least four other MDD symptoms most of the day, nearly every day in the same 2-week timeframe before your health care provider can confirm a diagnosis of MDD. How is this treated? This condition is usually treated by mental health professionals, such as psychologists, psychiatrists, and clinical social workers. You may need more than one type of treatment. Treatment may include:  Psychotherapy. This is also called talk therapy or counseling. Types of psychotherapy include: ? Cognitive behavioral   therapy (CBT). This type of therapy teaches you to recognize unhealthy feelings, thoughts, and behaviors, and replace them with  positive thoughts and actions. ? Interpersonal therapy (IPT). This helps you to improve the way you relate to and communicate with others. ? Family therapy. This treatment includes members of your family.  Medicine to treat anxiety and depression, or to help you control certain emotions and behaviors.  Lifestyle changes, such as: ? Limiting alcohol and drug use. ? Exercising regularly. ? Getting plenty of sleep. ? Making healthy eating choices. ? Spending more time outdoors.  Treatments involving stimulation of the brain can be used in situations with extremely severe symptoms, or when medicine or other therapies do not work over time. These treatments include electroconvulsive therapy, transcranial magnetic stimulation, and vagal nerve stimulation. Follow these instructions at home: Activity  Return to your normal activities as told by your health care provider.  Exercise regularly and spend time outdoors as told by your health care provider. General instructions  Take over-the-counter and prescription medicines only as told by your health care provider.  Do not drink alcohol. If you drink alcohol, limit your alcohol intake to no more than 1 drink a day for nonpregnant women and 2 drinks a day for men. One drink equals 12 oz of beer, 5 oz of wine, or 1 oz of hard liquor. Alcohol can affect any antidepressant medicines you are taking. Talk to your health care provider about your alcohol use.  Eat a healthy diet and get plenty of sleep.  Find activities that you enjoy doing, and make time to do them.  Consider joining a support group. Your health care provider may be able to recommend a support group.  Keep all follow-up visits as told by your health care provider. This is important. Where to find more information: National Alliance on Mental Illness  www.nami.org  [place] National Institute of Mental Health  www.nimh.nih.gov  National Suicide Prevention  Lifeline  1-800-273-TALK (8255). This is free, 24-hour help.  Contact a health care provider if:  Your symptoms get worse.  You develop new symptoms. Get help right away if:  You self-harm.  You have serious thoughts about hurting yourself or others.  You see, hear, taste, smell, or feel things that are not present (hallucinate). This information is not intended to replace advice given to you by your health care provider. Make sure you discuss any questions you have with your health care provider. Document Released: 05/05/2012 Document Revised: 09/15/2015 Document Reviewed: 07/20/2015 Elsevier Interactive Patient Education  2017 Elsevier Inc.  

## 2016-12-05 NOTE — Progress Notes (Signed)
Subjective:  Patient ID: Henry Adams, male    DOB: Dec 11, 1940  Age: 77 y.o. MRN: [id number]  CC: Follow-up (Patient is here today for a 3-week-F/U with depression. He states that he feels that he is getting better but not quite where he needs to be yet.  He also states that his head has felt a little dizzy/groggy since the day after starting the Lexapro. Had an episode the 2nd day where he got real sweaty at a funeral had help walking out and got some H2O and felt better, no other episodes. When he gets near something that causes him to feel anxious he has a burning in stomach but this week is been better.)   HPI Henry Adams presents for concerns about anxiety and depression , he wants to try a higher dose of lexapro.  Outpatient Medications Prior to Visit  Medication Sig Dispense Refill  . aspirin 81 MG tablet Take 81 mg by mouth daily.      [name] azelastine (ASTELIN) 137 MCG/SPRAY nasal spray Place 1 spray into both nostrils 2 (two) times daily. Use in each nostril as directed    . fexofenadine (ALLEGRA) 180 MG tablet Take 180 mg by mouth daily.      . finasteride (PROSCAR) 5 MG tablet Take 1 tablet (5 mg total) by mouth daily. 90 tablet 3  . hydrochlorothiazide (MICROZIDE) 12.5 MG capsule   3  . omeprazole (PRILOSEC) 40 MG capsule Take 1 capsule (40 mg total) by mouth daily. 90 capsule 3  . potassium chloride SA (K-DUR,KLOR-CON) 20 MEQ tablet Take 1 tablet (20 mEq total) by mouth daily. 15 tablet 0  . rosuvastatin (CRESTOR) 5 MG tablet Take 1 tablet (5 mg total) by mouth daily. 90 tablet 1  . telmisartan (MICARDIS) 40 MG tablet Take 20 mg by mouth daily.     . vitamin C (ASCORBIC ACID) 500 MG tablet Take 1,000 mg by mouth daily.     [name] zolpidem (AMBIEN) 5 MG tablet Take 1 tablet (5 mg total) by mouth at bedtime as needed for sleep. 30 tablet 0  . doxycycline (VIBRA-TABS) 100 MG tablet Take 100 mg by mouth 2 (two) times daily.    [name] escitalopram (LEXAPRO) 10 MG tablet Take 1 tablet (10 mg  total) by mouth daily. 30 tablet 11   No facility-administered medications prior to visit.     ROS Review of Systems  Constitutional: Negative.  Negative for appetite change, diaphoresis, fatigue and unexpected weight change.  HENT: Negative.   Eyes: Negative.  Negative for visual disturbance.  Respiratory: Negative for cough, chest tightness and shortness of breath.   Cardiovascular: Negative.  Negative for chest pain, palpitations and leg swelling.  Gastrointestinal: Negative for abdominal pain, diarrhea, nausea and vomiting.  Endocrine: Negative.   Genitourinary: Negative.   Musculoskeletal: Negative.  Negative for back pain and myalgias.  Skin: Negative.   Neurological: Negative.  Negative for dizziness.  Hematological: Negative for adenopathy. Does not bruise/bleed easily.  Psychiatric/Behavioral: Positive for dysphoric mood. Negative for behavioral problems, confusion, self-injury, sleep disturbance and suicidal ideas. The patient is nervous/anxious.     Objective:  BP 116/74 (BP Location: Right Arm, Patient Position: Sitting, Cuff Size: Normal)   Pulse 73   Temp 97.9 F (36.6 C) (Oral)   Ht 5\' 11"  (1.803 m)   Wt 163 lb 1.3 oz (74 kg)   SpO2 96%   BMI 22.75 kg/m   BP Readings from Last 3 Encounters:  12/05/16 116/74  11/14/16 122/68  10/24/16 112/76    Wt Readings from Last 3 Encounters:  12/05/16 163 lb 1.3 oz (74 kg)  11/14/16 163 lb (73.9 kg)  10/24/16 164 lb (74.4 kg)    Physical Exam  Constitutional: He is oriented to person, place, and time. No distress.  HENT:  Mouth/Throat: Oropharynx is clear and moist. No oropharyngeal exudate.  Eyes: Conjunctivae are normal. Right eye exhibits no discharge. Left eye exhibits no discharge. No scleral icterus.  Neck: Normal range of motion. Neck supple. No JVD present. No thyromegaly present.  Cardiovascular: Normal rate, regular rhythm and intact distal pulses. Exam reveals no gallop.  No murmur  heard. Pulmonary/Chest: Effort normal and breath sounds normal. No respiratory distress. He has no wheezes. He has no rales. He exhibits no tenderness.  Abdominal: Soft. Bowel sounds are normal. He exhibits no distension and no mass. There is no tenderness. There is no rebound and no guarding.  Musculoskeletal: Normal range of motion. He exhibits no edema, tenderness or deformity.  Lymphadenopathy:    He has no cervical adenopathy.  Neurological: He is alert and oriented to person, place, and time.  Skin: Skin is warm and dry. No rash noted. He is not diaphoretic. No erythema. No pallor.  Psychiatric: Judgment and thought content normal. His mood appears anxious. His affect is not angry. His speech is delayed and tangential. His speech is not rapid and/or pressured. He is slowed. He is not agitated and not withdrawn. Thought content is not paranoid and not delusional. Cognition and memory are impaired. He exhibits a depressed mood. He expresses no homicidal and no suicidal ideation. He expresses no suicidal plans and no homicidal plans. He exhibits abnormal recent memory and abnormal remote memory. He is inattentive.  Vitals reviewed.   Lab Results  Component Value Date   WBC 6.1 11/14/2016   HGB 13.3 11/14/2016   HCT 39.9 11/14/2016   PLT 151.0 11/14/2016   GLUCOSE 87 11/14/2016   CHOL 179 11/14/2016   TRIG 73.0 11/14/2016   HDL 68.50 11/14/2016   LDLDIRECT 131.7 07/02/2011   LDLCALC 96 11/14/2016   ALT 10 11/14/2016   AST 15 11/14/2016   NA 138 11/14/2016   K 3.7 11/14/2016   CL 101 11/14/2016   CREATININE 1.70 (H) 11/14/2016   BUN 28 (H) 11/14/2016   CO2 31 11/14/2016   TSH 1.61 11/14/2016   PSA 2.28 08/11/2015   INR 1.1 (H) 08/01/2012   HGBA1C 5.9 12/29/2009    No results found.  Assessment & Plan:   Henry Adams was seen today for follow-up.  Diagnoses and all orders for this visit:  Anxiety and depression- Will try higher dose of Lexapro to see if that helps with the  symptoms. -     escitalopram (LEXAPRO) 20 MG tablet; Take 1 tablet (20 mg total) daily by mouth.  Essential hypertension- His blood pressure is well controlled.   I have discontinued Henry Adams. Henry Adams's escitalopram and doxycycline. I am also having him start on escitalopram. Additionally, I am having him maintain his vitamin C, fexofenadine, aspirin, azelastine, telmisartan, hydrochlorothiazide, omeprazole, finasteride, potassium chloride SA, zolpidem, and rosuvastatin.  Meds ordered this encounter  Medications  . escitalopram (LEXAPRO) 20 MG tablet    Sig: Take 1 tablet (20 mg total) daily by mouth.    Dispense:  90 tablet    Refill:  1     Follow-up: Return in about 3 months (around 03/07/2017).  Scarlette Calico, MD

## 2016-12-12 DIAGNOSIS — H903 Sensorineural hearing loss, bilateral: Secondary | ICD-10-CM | POA: Diagnosis not present

## 2016-12-19 DIAGNOSIS — J3081 Allergic rhinitis due to animal (cat) (dog) hair and dander: Secondary | ICD-10-CM | POA: Diagnosis not present

## 2016-12-19 DIAGNOSIS — J3089 Other allergic rhinitis: Secondary | ICD-10-CM | POA: Diagnosis not present

## 2016-12-19 DIAGNOSIS — J301 Allergic rhinitis due to pollen: Secondary | ICD-10-CM | POA: Diagnosis not present

## 2017-01-08 DIAGNOSIS — J3089 Other allergic rhinitis: Secondary | ICD-10-CM | POA: Diagnosis not present

## 2017-01-08 DIAGNOSIS — J3081 Allergic rhinitis due to animal (cat) (dog) hair and dander: Secondary | ICD-10-CM | POA: Diagnosis not present

## 2017-01-08 DIAGNOSIS — J301 Allergic rhinitis due to pollen: Secondary | ICD-10-CM | POA: Diagnosis not present

## 2017-01-09 ENCOUNTER — Ambulatory Visit: Payer: Medicare Other | Admitting: Internal Medicine

## 2017-01-09 ENCOUNTER — Encounter: Payer: Self-pay | Admitting: Internal Medicine

## 2017-01-09 VITALS — BP 130/88 | HR 73 | Temp 98.1°F | Resp 16 | Ht 71.0 in | Wt 161.8 lb

## 2017-01-09 DIAGNOSIS — R4181 Age-related cognitive decline: Secondary | ICD-10-CM

## 2017-01-09 DIAGNOSIS — I10 Essential (primary) hypertension: Secondary | ICD-10-CM

## 2017-01-09 MED ORDER — DONEPEZIL HCL 5 MG PO TABS: 5.0000 mg | ORAL_TABLET | Freq: Every day | ORAL | 0 refills | Status: DC

## 2017-01-09 MED ORDER — MEMANTINE HCL 5 MG PO TABS: 5.0000 mg | ORAL_TABLET | Freq: Two times a day (BID) | ORAL | 0 refills | Status: DC

## 2017-01-09 NOTE — Patient Instructions (Signed)
Dementia Dementia is the loss of two or more brain functions, such as:  Memory.  Decision making.  Behavior.  Speaking.  Thinking.  Problem solving.  There are many types of dementia. The most common type is called progressive dementia. Progressive dementia gets worse with time and it is irreversible. An example of this type of dementia is Alzheimer disease. What are the causes? This condition may be caused by:  Nerve cell damage in the brain.  Genetic mutations.  Certain medicines.  Multiple small strokes.  An infection, such as chronic meningitis.  A metabolic problem, such as vitamin B12 deficiency or thyroid disease.  Pressure on the brain, such as from a tumor or blood clot.  What are the signs or symptoms? Symptoms of this condition include:  Sudden changes in mood.  Depression.  Problems with balance.  Changes in personality.  Poor short-term memory.  Agitation.  Delusions.  Hallucinations.  Having a hard time: ? Speaking thoughts. ? Finding words. ? Solving problems. ? Doing familiar tasks. ? Understanding familiar ideas.  How is this diagnosed? This condition is diagnosed with an assessment by your health care provider. During this assessment, your health care provider will talk with you and your family, friends, or caregivers about your symptoms. A thorough medical history will be taken, and you will have a physical exam and tests. Tests may include:  Lab tests, such as blood or urine tests.  Imaging tests, such as a CT scan, PET scan, or MRI.  A lumbar puncture. This test involves removing and testing a small amount of the fluid that surrounds the brain and spinal cord.  An electroencephalogram (EEG). In this test, small metal discs are used to measure electrical activity in the brain.  Memory tests, cognitive tests, and neuropsychological tests. These tests evaluate brain function.  How is this treated? Treatment depends on the  cause of the dementia. It may involve taking medicines that may help:  To control the dementia.  To slow down the disease.  To manage symptoms.  In some cases, treating the cause of the dementia can improve symptoms, reverse symptoms, or slow down how quickly the dementia gets worse. Your health care provider can help direct you to support groups, organizations, and other health care providers who can help with decisions about your care. Follow these instructions at home: Medicine  Take over-the-counter and prescription medicines only as told by your health care provider.  Avoid taking medicines that can affect thinking, such as pain or sleeping medicines. Lifestyle   Make healthy lifestyle choices: ? Be physically active as told by your health care provider. ? Do not use any tobacco products, such as cigarettes, chewing tobacco, and e-cigarettes. If you need help quitting, ask your health care provider. ? Eat a healthy diet. ? Practice stress-management techniques when you get stressed. ? Stay social.  Drink enough fluid to keep your urine clear or pale yellow.  Make sure to get quality sleep. These tips can help you to get a good night's rest: ? Avoid napping during the day. ? Keep your sleeping area dark and cool. ? Avoid exercising during the few hours before you go to bed. ? Avoid caffeine products in the evening. General instructions  Work with your health care provider to determine what you need help with and what your safety needs are.  If you were given a bracelet that tracks your location, make sure to wear it.  Keep all follow-up visits as told by your   health care provider. This is important. Contact a health care provider if:  You have any new symptoms.  You have problems with choking or swallowing.  You have any symptoms of a different illness. Get help right away if:  You develop a fever.  You have new or worsening confusion.  You have new or  worsening sleepiness.  You have a hard time staying awake.  You or your family members become concerned for your safety. This information is not intended to replace advice given to you by your health care provider. Make sure you discuss any questions you have with your health care provider. Document Released: 07/04/2000 Document Revised: 05/19/2015 Document Reviewed: 10/06/2014 Elsevier Interactive Patient Education  2018 [employer]vier Inc.  

## 2017-01-09 NOTE — Progress Notes (Signed)
Subjective:  Patient ID: Henry Adams, male    DOB: [birth year]  Age: 76 y.o. MRN: [id number]  CC: Hypertension   HPI Henry Adams presents for f/up- he complains of continued cognitive decline despite taking escitalopram for depression.  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  He complains that his declining cognitive skills have made him feel nervous, anxious, and depressed.  He has a history of benign tremor and previously saw a movement disorder neurologist but he has not seen a neurologist for cognitive complaints.  Outpatient Medications Prior to Visit  Medication Sig Dispense Refill  . aspirin 81 MG tablet Take 81 mg by mouth daily.      [name] azelastine (ASTELIN) 137 MCG/SPRAY nasal spray Place 1 spray into both nostrils 2 (two) times daily. Use in each nostril as directed    . escitalopram (LEXAPRO) 20 MG tablet Take 1 tablet (20 mg total) daily by mouth. 90 tablet 1  . fexofenadine (ALLEGRA) 180 MG tablet Take 180 mg by mouth daily.      . finasteride (PROSCAR) 5 MG tablet Take 1 tablet (5 mg total) by mouth daily. 90 tablet 3  . hydrochlorothiazide (MICROZIDE) 12.5 MG capsule   3  . omeprazole (PRILOSEC) 40 MG capsule Take 1 capsule (40 mg total) by mouth daily. 90 capsule 3  . potassium chloride SA (K-DUR,KLOR-CON) 20 MEQ tablet Take 1 tablet (20 mEq total) by mouth daily. 15 tablet 0  . rosuvastatin (CRESTOR) 5 MG tablet Take 1 tablet (5 mg total) by mouth daily. 90 tablet 1  . telmisartan (MICARDIS) 40 MG tablet Take 20 mg by mouth daily.     . vitamin C (ASCORBIC ACID) 500 MG tablet Take 1,000 mg by mouth daily.     [name] zolpidem (AMBIEN) 5 MG tablet Take 1 tablet (5 mg total) by mouth at bedtime as needed for sleep. 30 tablet 0   No facility-administered medications prior to visit.     ROS Review of Systems  Constitutional: Negative for chills, fatigue and fever.  HENT: Negative for dental problem, facial swelling,  postnasal drip, rhinorrhea, sinus pressure, sinus pain, sore throat and trouble swallowing.   Eyes: Negative.   Respiratory: Negative for cough (NP), chest tightness, shortness of breath and wheezing.   Cardiovascular: Negative for chest pain, palpitations and leg swelling.  Gastrointestinal: Negative for abdominal pain, constipation, diarrhea, nausea and vomiting.  Endocrine: Negative.  Negative for cold intolerance.  Genitourinary: Negative.  Negative for difficulty urinating.  Musculoskeletal: Negative.  Negative for back pain, myalgias and neck pain.  Skin: Negative for color change, rash and wound.  Allergic/Immunologic: Negative.   Neurological: Positive for tremors. Negative for dizziness.  Hematological: Negative for adenopathy. Does not bruise/bleed easily.  Psychiatric/Behavioral: Positive for confusion, decreased concentration and dysphoric mood. Negative for agitation, behavioral problems, hallucinations, self-injury, sleep disturbance and suicidal ideas. The patient is nervous/anxious. The patient is not hyperactive.     Objective:  BP 130/88 (BP Location: Left Arm, Patient Position: Sitting, Cuff Size: Normal)   Pulse 73   Temp 98.1 F (36.7 C) (Oral)   Resp 16   Ht 5\' 11"  (1.803 m)   Wt 161 lb 12 oz (73.4 kg)   SpO2 98%   BMI 22.56 kg/m   BP Readings from Last 3 Encounters:  01/09/17 130/88  12/05/16 116/74  11/14/16 122/68    Wt Readings from Last 3 Encounters:  01/09/17 161 lb 12 oz (73.4 kg)  12/05/16 163 lb 1.3 oz (74 kg)  11/14/16 163 lb (73.9 kg)    Physical Exam  Constitutional: He is oriented to person, place, and time. No distress.  HENT:  Mouth/Throat: Oropharynx is clear and moist. No oropharyngeal exudate.  Eyes: Conjunctivae are normal. Left eye exhibits no discharge. No scleral icterus.  Neck: Normal range of motion. Neck supple. No JVD present. No thyromegaly present.  Cardiovascular: Normal rate, regular rhythm and normal heart sounds. Exam  reveals no gallop.  No murmur heard. Pulmonary/Chest: Effort normal and breath sounds normal. No respiratory distress. He has no wheezes. He has no rales.  Abdominal: Soft. Bowel sounds are normal. He exhibits no distension and no mass. There is no tenderness. There is no guarding.  Musculoskeletal: Normal range of motion. He exhibits no edema or tenderness.  Lymphadenopathy:    He has no cervical adenopathy.  Neurological: He is alert and oriented to person, place, and time. He displays tremor. He displays no atrophy. No cranial nerve deficit or sensory deficit. He exhibits normal muscle tone. He displays a negative Romberg sign. He displays no seizure activity. Coordination and gait normal.  He expresses very few emotions and very few facial expressions.  He has a fine hand tremor but there is no  rigidity  Skin: Skin is warm and dry. No rash noted. He is not diaphoretic. No erythema. No pallor.  Vitals reviewed.   Lab Results  Component Value Date   WBC 6.1 11/14/2016   HGB 13.3 11/14/2016   HCT 39.9 11/14/2016   PLT 151.0 11/14/2016   GLUCOSE 87 11/14/2016   CHOL 179 11/14/2016   TRIG 73.0 11/14/2016   HDL 68.50 11/14/2016   LDLDIRECT 131.7 07/02/2011   LDLCALC 96 11/14/2016   ALT 10 11/14/2016   AST 15 11/14/2016   NA 138 11/14/2016   K 3.7 11/14/2016   CL 101 11/14/2016   CREATININE 1.70 (H) 11/14/2016   BUN 28 (H) 11/14/2016   CO2 31 11/14/2016   TSH 1.61 11/14/2016   PSA 2.28 08/11/2015   INR 1.1 (H) 08/01/2012   HGBA1C 5.9 12/29/2009    No results found.  Assessment & Plan:   Henry Adams was seen today for hypertension.  Diagnoses and all orders for this visit:  Age-related cognitive decline- Will start treating his declining cognitive function with donepezil and memantine.  This is in the hopes that it will delay any further decline or possibly improve his cognitive skills.  I have asked him to also follow-up with neurology for second opinion about this. -      donepezil (ARICEPT) 5 MG tablet; Take 1 tablet (5 mg total) by mouth at bedtime. -     Ambulatory referral to Neurology -     memantine (NAMENDA) 5 MG tablet; Take 1 tablet (5 mg total) by mouth 2 (two) times daily.  Essential hypertension-his blood pressure is adequately well controlled.   I am having Henry Adams start on donepezil and memantine. I am also having him maintain his vitamin C, fexofenadine, aspirin, azelastine, telmisartan, hydrochlorothiazide, omeprazole, finasteride, potassium chloride SA, zolpidem, rosuvastatin, and escitalopram.  Meds ordered this encounter  Medications  . donepezil (ARICEPT) 5 MG tablet    Sig: Take 1 tablet (5 mg total) by mouth at bedtime.    Dispense:  90 tablet    Refill:  0  . memantine (NAMENDA) 5 MG tablet    Sig: Take 1 tablet (5 mg total)  by mouth 2 (two) times daily.    Dispense:  180 tablet    Refill:  0     Follow-up: Return in about 3 months (around 04/09/2017).  Scarlette Calico, MD

## 2017-01-23 DIAGNOSIS — J3081 Allergic rhinitis due to animal (cat) (dog) hair and dander: Secondary | ICD-10-CM | POA: Diagnosis not present

## 2017-01-23 DIAGNOSIS — J3089 Other allergic rhinitis: Secondary | ICD-10-CM | POA: Diagnosis not present

## 2017-01-23 DIAGNOSIS — J301 Allergic rhinitis due to pollen: Secondary | ICD-10-CM | POA: Diagnosis not present

## 2017-01-31 ENCOUNTER — Ambulatory Visit: Payer: Self-pay

## 2017-01-31 NOTE — Telephone Encounter (Signed)
No, they are very different medications

## 2017-01-31 NOTE — Telephone Encounter (Signed)
Pt had a reaction to aricept - nightmares Is Namenda likely to do the same? When should the pt come back to see you?

## 2017-01-31 NOTE — Progress Notes (Signed)
Henry Adams was seen today in the movement disorders clinic for neurologic consultation at the request of [name], MD.    Prior records made available to me were reviewed.  The patient first presented to [facility] in August, 2015 with complaints of a weak voice.    States that his voice was getting soft for a few years prior to that.   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   It helped for 4-5 months.   He recently followed up with his ENT physician at [facility] on 03/23/2015 complaining of continued weak voice, but they felt that his voice was fairly strong and did not see any pathology.  He was to follow-up with the voice lab.   The voice therapy didn't seem to help.   He did follow up with his primary care physician and was complaining about some mild tremor and was referred here to rule out Parkinson's disease.  Pt states that it is when he holds something proximally that he notes tremor bilaterally.  He also notes difficulty with handwriting; it is small and "squiggly."   02/01/17 update: Patient is seen today in follow-up, although I have not seen him in 2 years.  He is here to discuss memory change per referral but comes with a list of many other issues.  This patient is accompanied in the office by his daughter who supplements the history.    I have reviewed numerous records made available to me.  Pt reports he has been concerned about memory for about 10 months, although he complained about this concern to me about 2 years ago although there was no evidence of dementia then.  He was placed on Lexapro in October for concerns of anxiety and depression, which could potentially affect memory.  Pt thinks that was helpful but then later says that anxiety and depression are his biggest issue.  Living situation:  Pt lives with their spouse.  However, the patient's wife has Alzheimer dementia and the  patient is responsible for her care.   They live independently in retirement living.   The patient does do the finances in the home. Pt reports that he can do these but he has low initiative/motivation to do that. He isn't really sure that he has a memory problem but a motivation/energy problem.   He hasn't missed paying any bills.  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  He notes trouble with his cursive writing as well.   The patient does drive.  He has had no problem with that.  He programs his GPS himself and goes.   There have no been any motor vehicle accidents in the recent years.  The patient does not cook.  He states that they provide meals at retirement living .  He works out at [employer] at retirement living.  ADL's:  The patient is able to perform his own ADL's. The patient self medicates.  The patients bladder and bowel are under good control.   Behavior:  There have been no behavioral changes over the years.  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  Wifes personality has also changed with AD; becomes  agitated and paranoid.  Pt taking all the "brunt" of that per daughter.    PREVIOUS MEDICATIONS: given RX for aricept on 01/09/17 but pt reported vivid scary dreams with it after 3 days and he d/c it.  He started on namenda a few days ago.  They ask me again about Parkinsons and if he has that.  He has tremor.  He has "skin sensitivity" of the feet.    ALLERGIES:   Allergies  Allergen Reactions  . Iodine     rash  . Diazepam     Mental status changes   . Oxycodone     Mental status changes & nausea  . Pollen Extract Itching  . Tamsulosin     Other reaction(s): Other (See Comments) Unknown   . Doxazosin Rash    agitation  . Etodolac Rash    unknown  . Tape Rash    Certain  "Band-Aids"    CURRENT MEDICATIONS:  Outpatient Encounter Medications as of 02/04/2017  Medication Sig  . aspirin 81 MG tablet Take 81 mg by mouth daily.    [name] azelastine (ASTELIN) 137 MCG/SPRAY nasal spray Place 1 spray into both nostrils 2 (two) times daily. Use in each nostril as directed  . escitalopram (LEXAPRO) 20 MG tablet Take 1 tablet (20 mg total) daily by mouth.  . fexofenadine (ALLEGRA) 180 MG tablet Take 180 mg by mouth daily.    . hydrochlorothiazide (MICROZIDE) 12.5 MG capsule   . memantine (NAMENDA) 5 MG tablet Take 1 tablet (5 mg total) by mouth 2 (two) times daily.  [name] omeprazole (PRILOSEC) 40 MG capsule Take 1 capsule (40 mg total) by mouth daily.  . rosuvastatin (CRESTOR) 5 MG tablet Take 1 tablet (5 mg total) by mouth daily.  [name] telmisartan (MICARDIS) 40 MG tablet Take 20 mg by mouth daily.   . vitamin C (ASCORBIC ACID) 500 MG tablet Take 1,000 mg by mouth daily.   [name] zolpidem (AMBIEN) 5 MG tablet Take 1 tablet (5 mg total) by mouth at bedtime as needed for sleep.  . [DISCONTINUED] donepezil (ARICEPT) 5 MG tablet Take 1 tablet (5 mg total) by mouth at bedtime.  . [DISCONTINUED] finasteride (PROSCAR) 5 MG tablet Take 1 tablet (5 mg total) by mouth daily.  . [DISCONTINUED] potassium chloride SA (K-DUR,KLOR-CON) 20 MEQ tablet Take 1 tablet (20 mEq total) by mouth daily.   No facility-administered encounter medications on file as of 02/04/2017.     PAST MEDICAL HISTORY:   Past Medical History:  Diagnosis Date  . Allergy    perennial  . CKD (chronic kidney disease)   . GERD (gastroesophageal reflux disease)   . HLD (hyperlipidemia)   . Hx of cardiovascular stress test    ETT-Myoview 6/14: probable low risk study, inf defect with normal inf wall motion-cannot r/o ischemia; prominent gut uptake adjacent to inf wall on rest images-poss defect is atten, EF 63%  . Hypertension   . Polycystic kidney disease     PAST SURGICAL HISTORY:   Past Surgical History:  Procedure  Laterality Date  . COLONOSCOPY  2011   diverticulosis  . HERNIA REPAIR     umbilical  . KNEE SURGERY Left   . medialization laryngoplasty  10/15/2013   Dr Joya Gaskins, [facility]  . TONSILLECTOMY AND ADENOIDECTOMY      SOCIAL HISTORY:   Social History   Socioeconomic History  . Marital status: Married    Spouse name: Not on file  . Number of children: Not on file  .  Years of education: Not on file  . Highest education level: Not on file  Social Needs  . Financial resource strain: Not on file  . Food insecurity - worry: Not on file  . Food insecurity - inability: Not on file  . Transportation needs - medical: Not on file  . Transportation needs - non-medical: Not on file  Occupational History  . 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 Smoking status: Never Smoker  . Smokeless tobacco: Never Used  Substance and Sexual Activity  . Alcohol use: Yes    Alcohol/week: 0.0 oz    Comment:  2-3 beers / month  . Drug use: No  . Sexual activity: Not on file  Other Topics Concern  . Not on file  Social History Narrative  . Not on file    FAMILY HISTORY:   Family Status  Relation Name Status  . Mother  Deceased       heart disease, dementia  . Father  Deceased       HTN, ulcers  . Sister  Deceased       breast cancer  . Brother  Deceased       cancer tongue/cheek, heart disease  . Sister  Deceased       stroke, GERD  . Brother  Alive       DJD  . Child  Alive       2 daughters, breast CA hx  . Sister  (Not Specified)  . Brother  (Not Specified)  . Daughter  (Not Specified)  . Sister  (Not Specified)  . Brother  (Not Specified)    ROS:  Having some binocular diplopia when looking left.  A complete 10 system review of systems was obtained and was unremarkable apart from what is mentioned above.  PHYSICAL EXAMINATION:    VITALS:   Vitals:   02/04/17 0921  BP: 130/88  Pulse: 78  SpO2: 96%  Weight: 163 lb (73.9 kg)    Height: 5\' 11"  (1.803 m)     GEN:  The patient appears stated age and is in NAD. HEENT:  Normocephalic, atraumatic.  The mucous membranes are moist. The superficial temporal arteries are without ropiness or tenderness.  There are no tongue fasciculations. CV:  RRR Lungs:  CTAB Neck/HEME:  There are no carotid bruits bilaterally.  Neurological examination:  Orientation:  Montreal Cognitive Assessment  02/04/2017  Visuospatial/ Executive (0/5) 3  Naming (0/3) 1  Attention: Read list of digits (0/2) 2  Attention: Read list of letters (0/1) 1  Attention: Serial 7 subtraction starting at 100 (0/3) 1  Language: Repeat phrase (0/2) 2  Language : Fluency (0/1) 0  Abstraction (0/2) 2  Delayed Recall (0/5) 2  Orientation (0/6) 6  Total 20  Adjusted Score (based on education) 20   Cranial nerves: There is good facial symmetry but there is asymmetric blink. There is slight L ptosis vs pseudoptosis (likely pseudoptosis from lid lag on the left).  Pupils are equal round and reactive to light bilaterally. Fundoscopic exam reveals clear margins bilaterally. Extraocular muscles are intact. The visual fields are full to confrontational testing. The speech is fluent and clear.   He is mildly hypophonic.  The patient is able to make the gutteral sounds without difficulty.  Soft palate rises symmetrically and there is no tongue deviation. Hearing is intact to conversational tone. Sensation: Sensation is intact to light and pinprick throughout (facial, trunk, extremities). Vibration  is absent at the bilateral big toe and decreased at the ankle and knee.  There is no extinction with double simultaneous stimulation. There is no sensory dermatomal level identified. Motor: Strength is 5/5 in the bilateral upper and lower extremities.   Shoulder shrug is equal and symmetric.  There is no pronator drift.  There are no fasciculations noted across the legs, arms, chest or back. Deep tendon reflexes: Deep tendon  reflexes are 3/4 at the bilateral biceps, triceps, brachioradialis, patella and absent at the bilateral achilles. Plantar responses are downgoing bilaterally.  Movement examination: Tone: There is minimal increased tone in the RUE.   Abnormal movements: none even with distraction procedures.  No rest or postural tremor noted Coordination:  There is mild trouble with  alternation of supination/pronation of the forearm bilaterally.   Gait and Station: The patient has no difficulty arising out of a deep-seated chair without the use of the hands. The patient's stride length is normal.  He is stooped and has decreased arm swing bilaterally, R more than L.  Lab Results  Component Value Date   TSH 1.61 11/14/2016   Lab Results  Component Value Date   VITAMINB12 361 11/14/2016      Chemistry      Component Value Date/Time   NA 138 11/14/2016 1516   K 3.7 11/14/2016 1516   CL 101 11/14/2016 1516   CO2 31 11/14/2016 1516   BUN 28 (H) 11/14/2016 1516   CREATININE 1.70 (H) 11/14/2016 1516      Component Value Date/Time   CALCIUM 9.0 11/14/2016 1516   ALKPHOS 63 11/14/2016 1516   AST 15 11/14/2016 1516   ALT 10 11/14/2016 1516   BILITOT 0.9 11/14/2016 1516          ASSESSMENT/PLAN:  1.  Memory loss  -We will schedule neurocognitive testing.  -His B12 was just slightly low and I would recommend a B12 supplement.  I would like to see at over 400.  B12 is recommended at 1000 mcg daily.  -I do think that pseudodementia plays a big role here.  He agrees that depression/anxiety are affecting him.  He has had major life change with moving and taking care of wife with dementia.  Perhaps lexapro can be changed out for remeron, given that sleep is an issue in him to.  He will discuss with PCP  2.  Peripheral neuropathy  -The patient has clinical examination evidence of a diffuse peripheral neuropathy, which certainly can affect gait and balance.  We discussed safety associated with peripheral  neuropathy.  We discussed balance therapy and the importance of ambulatory assistive device for balance assistance.  -We will do labs to r/o reversible causes of peripheral neuropathy.  Will add achR Ab's given ptosis, diplopia, energy issues.    3.  Parkinsonism  -doesn't meet criteria for PD but has some features of parkinsonism.  Ultimately, we decided to go ahead and try levodopa to see if that made a difference in how he feels.  He will workup to carbidopa/levodopa 25/100, 1 tablet 3 times per day.  Risks, benefits, side effects and alternative therapies were discussed.  The opportunity to ask questions was given and they were answered to the best of my ability.  The patient expressed understanding and willingness to follow the outlined treatment protocols.  4.  Follow up is anticipated in the next few months, sooner should new neurologic issues arise.  Much greater than 50% of this visit was spent in counseling and  coordinating care.  Total face to face time:  45 min

## 2017-01-31 NOTE — Telephone Encounter (Signed)
Pt.'s daughter called to report pt. Tried to start Aricept 2 weeks ago but "had horrible nightmares from the medicine." Has not started the Namenda "because he is afraid of having a similar reaction." Does have appointment Monday with Neurology. Would like Dr. Ronnald Ramp advise in regard to the Aricept and Namenda. He is also still having insomnia. Would like to know when should he see Dr. Ronnald Ramp again, as well.

## 2017-02-01 NOTE — Telephone Encounter (Signed)
Spoke to pt dtr [name]) and informed of MD response and recommendation.  [name] stated understanding and will see how the [place] works for pt and let us know. They will keep the appt with Dr. Carles Collet.

## 2017-02-04 ENCOUNTER — Ambulatory Visit: Payer: Medicare Other | Admitting: Neurology

## 2017-02-04 ENCOUNTER — Encounter: Payer: Self-pay | Admitting: Neurology

## 2017-02-04 ENCOUNTER — Other Ambulatory Visit: Payer: Medicare Other

## 2017-02-04 VITALS — BP 130/88 | HR 78 | Ht 71.0 in | Wt 163.0 lb

## 2017-02-04 DIAGNOSIS — G609 Hereditary and idiopathic neuropathy, unspecified: Secondary | ICD-10-CM | POA: Diagnosis not present

## 2017-02-04 DIAGNOSIS — G20 Parkinson's disease: Secondary | ICD-10-CM

## 2017-02-04 DIAGNOSIS — H02402 Unspecified ptosis of left eyelid: Secondary | ICD-10-CM | POA: Diagnosis not present

## 2017-02-04 DIAGNOSIS — R413 Other amnesia: Secondary | ICD-10-CM

## 2017-02-04 DIAGNOSIS — H532 Diplopia: Secondary | ICD-10-CM

## 2017-02-04 MED ORDER — CARBIDOPA-LEVODOPA 25-100 MG PO TABS: 1.0000 | ORAL_TABLET | Freq: Three times a day (TID) | ORAL | 3 refills | Status: DC

## 2017-02-04 NOTE — Patient Instructions (Addendum)
1. Take a b12 supplement - 1050micrograms daily  2. Start Carbidopa Levodopa as follows:  Take 1/2 tablet three times daily, at least 30 minutes before meals, for one week  Then take 1/2 tablet in the morning, 1/2 tablet in the afternoon, 1 tablet in the evening, at least 30 minutes before meals, for one week  Then take 1/2 tablet in the morning, 1 tablet in the afternoon, 1 tablet in the evening, at least 30 minutes before meals, for one week  Then take 1 tablet three times daily, at least 30 minutes before meals  3. Your provider has requested that you have labwork completed today. Please go to [facility] ([id number]) on the second floor of this building before leaving the office today. You do not need to check in. If you are not called within 15 minutes please check with the front desk.   4. You have been referred for a neurocognitive evaluation in our office.   The evaluation takes approximately two hours. The first part of the appointment is a clinical interview with the neuropsychologist (Dr. Macarthur Critchley). Please bring someone with you to this appointment if possible, as it is helpful for Dr. Si Raider to hear from both you and another adult who knows you well. After speaking with Dr. Si Raider, you will complete testing with her technician. The testing includes a variety of tasks- mostly question-and-answer, some paper-and-pencil. There is nothing you need to do to prepare for this appointment, but having a good night's sleep prior to the testing, and bringing eyeglasses and hearing aids (if you wear them), is advised.   About a week after the evaluation, you will return to follow up with Dr. Si Raider to review the test results. This appointment is about 30 minutes. If you would like a family member to receive this information as well, please bring them to the appointment.   We have to reserve several hours of the neuropsychologist's time and the psychometrician's time for your  evaluation appointment. As such, please note that there is a No-Show fee of $100. If you are unable to attend any of your appointments, please contact our office as soon as possible to reschedule.

## 2017-02-06 DIAGNOSIS — J3089 Other allergic rhinitis: Secondary | ICD-10-CM | POA: Diagnosis not present

## 2017-02-06 DIAGNOSIS — J3081 Allergic rhinitis due to animal (cat) (dog) hair and dander: Secondary | ICD-10-CM | POA: Diagnosis not present

## 2017-02-06 DIAGNOSIS — J301 Allergic rhinitis due to pollen: Secondary | ICD-10-CM | POA: Diagnosis not present

## 2017-02-07 ENCOUNTER — Telehealth: Payer: Self-pay | Admitting: Psychology

## 2017-02-07 NOTE — Telephone Encounter (Signed)
Telephone call to patient to connect with and see if it would be helpful to meet with me to provide resources per Dr. Doristine Devoid recommendation.  I left a message and encouraged him to give me a call back.  I would be more than happy to talk with him on the phone or meet with him in person at my office to provide resources as appropriate and needed.

## 2017-02-09 LAB — PROTEIN ELECTROPHORESIS,RANDOM URN
ALBUMIN UR 24 HR ELECTRO: 54 %
Alpha-1-Globulin, U: 3 %
Alpha-2-Globulin, U: 11 %
Beta Globulin, U: 16 %
Creatinine, Urine: 215 mg/dL (ref 20–320)
Gamma Globulin, U: 15 %
PROTEIN/CREAT RATIO: 107 mg/g{creat} (ref 22–128)
TOTAL PROTEIN, URINE: 23 mg/dL (ref 5–25)

## 2017-02-09 LAB — PROTEIN ELECTROPHORESIS, SERUM
ALPHA 1: 0.2 g/dL (ref 0.2–0.3)
Albumin ELP: 4 g/dL (ref 3.8–4.8)
Alpha 2: 0.6 g/dL (ref 0.5–0.9)
Beta 2: 0.4 g/dL (ref 0.2–0.5)
Beta Globulin: 0.4 g/dL (ref 0.4–0.6)
GAMMA GLOBULIN: 1.5 g/dL (ref 0.8–1.7)
Total Protein: 7.1 g/dL (ref 6.1–8.1)

## 2017-02-09 LAB — IMMUNOFIXATION ELECTROPHORESIS
IGG (IMMUNOGLOBIN G), SERUM: 1651 mg/dL — AB (ref 694–1618)
IgM, Serum: 115 mg/dL (ref 48–271)
Immunofix Electr Int: NOT DETECTED
Immunoglobulin A: 317 mg/dL (ref 81–463)

## 2017-02-09 LAB — MYASTHENIA GRAVIS PANEL 2
A CHR BINDING ABS: <0.3 nmol/L
Acetylchol Modul Ab: 8 % Inhibition

## 2017-02-09 LAB — IMMUNOFIXATION INTE

## 2017-02-09 LAB — FOLATE: FOLATE: 12.9 ng/mL

## 2017-02-09 LAB — EXTRA URINE SPECIMEN

## 2017-02-09 LAB — RPR: RPR Ser Ql: NONREACTIVE

## 2017-02-11 ENCOUNTER — Telehealth: Payer: Self-pay | Admitting: Neurology

## 2017-02-11 DIAGNOSIS — J301 Allergic rhinitis due to pollen: Secondary | ICD-10-CM | POA: Diagnosis not present

## 2017-02-11 NOTE — Telephone Encounter (Signed)
Mychart message sent to patient.

## 2017-02-11 NOTE — Telephone Encounter (Signed)
-----   Message from [place], DO sent at 02/11/2017  7:28 AM EST ----- I have reviewed all lab results which are normal or stable. Please inform the patient.

## 2017-02-27 DIAGNOSIS — J301 Allergic rhinitis due to pollen: Secondary | ICD-10-CM | POA: Diagnosis not present

## 2017-02-27 DIAGNOSIS — J3081 Allergic rhinitis due to animal (cat) (dog) hair and dander: Secondary | ICD-10-CM | POA: Diagnosis not present

## 2017-02-27 DIAGNOSIS — J3089 Other allergic rhinitis: Secondary | ICD-10-CM | POA: Diagnosis not present

## 2017-03-12 ENCOUNTER — Ambulatory Visit: Payer: Medicare Other | Admitting: Neurology

## 2017-03-12 ENCOUNTER — Encounter: Payer: Self-pay | Admitting: Neurology

## 2017-03-12 VITALS — BP 142/78 | HR 68 | Ht 71.0 in | Wt 163.0 lb

## 2017-03-12 DIAGNOSIS — G20 Parkinson's disease: Secondary | ICD-10-CM

## 2017-03-12 DIAGNOSIS — G2581 Restless legs syndrome: Secondary | ICD-10-CM | POA: Diagnosis not present

## 2017-03-12 DIAGNOSIS — F330 Major depressive disorder, recurrent, mild: Secondary | ICD-10-CM

## 2017-03-12 MED ORDER — CARBIDOPA-LEVODOPA ER 50-200 MG PO TBCR: 1.0000 | EXTENDED_RELEASE_TABLET | Freq: Every day | ORAL | 1 refills | Status: DC

## 2017-03-12 MED ORDER — CARBIDOPA-LEVODOPA ER 25-100 MG PO TBCR: 1.0000 | EXTENDED_RELEASE_TABLET | Freq: Two times a day (BID) | ORAL | 1 refills | Status: DC

## 2017-03-12 NOTE — Patient Instructions (Signed)
1.  Change timing of carbidopa/levodopa IR to 7am/11am/3-4pm  2.  Add carbidopa/levodopa 25/100 CR at bedtime 3.  Exercise!

## 2017-03-12 NOTE — Progress Notes (Signed)
Henry Adams was seen today in the movement disorders clinic for neurologic consultation at the request of Henry Lima, MD.    Prior records made available to me were reviewed.  The patient first presented to [facility] in August, 2015 with complaints of a weak voice.    States that his voice was getting soft for a few years prior to that.   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   It helped for 4-5 months.   He recently followed up with his ENT physician at [facility] on 03/23/2015 complaining of continued weak voice, but they felt that his voice was fairly strong and did not see any pathology.  He was to follow-up with the voice lab.   The voice therapy didn't seem to help.   He did follow up with his primary care physician and was complaining about some mild tremor and was referred here to rule out Parkinson's disease.  Pt states that it is when he holds something proximally that he notes tremor bilaterally.  He also notes difficulty with handwriting; it is small and "squiggly."   02/01/17 update: Patient is seen today in follow-up, although I have not seen him in 2 years.  He is here to discuss memory change per referral but comes with a list of many other issues.  This patient is accompanied in the office by his daughter who supplements the history.    I have reviewed numerous records made available to me.  Pt reports he has been concerned about memory for about 10 months, although he complained about this concern to me about 2 years ago although there was no evidence of dementia then.  He was placed on Lexapro in October for concerns of anxiety and depression, which could potentially affect memory.  Pt thinks that was helpful but then later says that anxiety and depression are his biggest issue.  Living situation:  Pt lives with their spouse.  However, the patient's wife has Alzheimer dementia and the  patient is responsible for her care.   They live independently in retirement living.   The patient does do the finances in the home. Pt reports that he can do these but he has low initiative/motivation to do that. He isn't really sure that he has a memory problem but a motivation/energy problem.   He hasn't missed paying any bills.  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  He notes trouble with his cursive writing as well.   The patient does drive.  He has had no problem with that.  He programs his GPS himself and goes.   There have no been any motor vehicle accidents in the recent years.  The patient does not cook.  He states that they provide meals at retirement living .  He works out at [employer] at retirement living.  ADL's:  The patient is able to perform his own ADL's. The patient self medicates.  The patients bladder and bowel are under good control.   Behavior:  There have been no behavioral changes over the years.  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  Wifes personality has also changed with AD; becomes  agitated and paranoid.  Pt taking all the "brunt" of that per daughter.    03/12/17 update: Patient was seen today as a work in, at his request.  This patient is accompanied in the office by his daughter who supplements the history.Last visit, he was complaining about memory change and he was scheduled for neurocognitive testing.  This has not yet been completed, due to the fact that there is a lengthy wait list for this and the fact that he requested an early work in appointment.  Lab work was done for peripheral neuropathy as well as for myasthenia, and all of this was unremarkable.  We did start him on levodopa last visit and was to work up to carbidopa/levodopa 25/100 over a 4 week time span.   He is taking it 8am/noon/midnight.  Stays up to take at midnight so can spread evenly q 8 hours.   "I have made some progress and there are some days that I sleep all night."  Biggest c/o is restlessness and leg pain at night.  He has trouble in the AM for about 2-3 hours.  The trouble he describes are aches.  "so much of the trouble centers around neuropathy."  Daughter states that big contributor is they have had some bad times in taking care of wife and note that stress increases physical symptoms.  On lexapro 20 mg - been on that for about 2 months per patient.  He isn't sure that it is helping.  Daughter not sure.  Would like something else  PREVIOUS MEDICATIONS: given RX for aricept on 01/09/17 but pt reported vivid scary dreams with it after 3 days and he d/c it.  He started on namenda    ALLERGIES:   Allergies  Allergen Reactions  . Iodine     rash  . Diazepam     Mental status changes   . Oxycodone     Mental status changes & nausea  . Pollen Extract Itching  . Tamsulosin     Other reaction(s): Other (See Comments) Unknown   . Doxazosin Rash    agitation  . Etodolac Rash    unknown  . Tape Rash    Certain "Band-Aids"    CURRENT MEDICATIONS:  Outpatient Encounter Medications as of 03/12/2017  Medication Sig  . aspirin 81 MG tablet Take 81 mg by mouth daily.    [name] azelastine (ASTELIN) 137 MCG/SPRAY nasal spray Place 1 spray into both nostrils 2 (two) times daily. Use in each nostril as directed  . carbidopa-levodopa (SINEMET IR) 25-100 MG tablet Take 1 tablet by mouth 3 (three) times daily.  [name] escitalopram (LEXAPRO) 20 MG tablet Take 1 tablet (20 mg total) daily by mouth.  . fexofenadine (ALLEGRA) 180 MG tablet Take 180 mg by mouth daily.    . hydrochlorothiazide (MICROZIDE) 12.5 MG capsule   . omeprazole (PRILOSEC) 40 MG capsule Take 1 capsule (40 mg total) by mouth daily.  . rosuvastatin (CRESTOR) 5 MG tablet Take 1 tablet (5 mg total) by mouth daily.  [name] telmisartan  (MICARDIS) 40 MG tablet Take 20 mg by mouth daily.   . vitamin B-12 (CYANOCOBALAMIN) 1000 MCG tablet Take 1,000 mcg by mouth daily.  . vitamin C (ASCORBIC ACID) 500 MG tablet Take 1,000 mg by mouth daily.   [name] zolpidem (AMBIEN) 5 MG tablet Take 1 tablet (5 mg total) by mouth at bedtime as needed for sleep.  . [DISCONTINUED] memantine (NAMENDA) 5 MG tablet Take 1 tablet (5 mg total) by mouth  2 (two) times daily.   No facility-administered encounter medications on file as of 03/12/2017.     PAST MEDICAL HISTORY:   Past Medical History:  Diagnosis Date  . Allergy    perennial  . CKD (chronic kidney disease)   . GERD (gastroesophageal reflux disease)   . HLD (hyperlipidemia)   . Hx of cardiovascular stress test    ETT-Myoview 6/14: probable low risk study, inf defect with normal inf wall motion-cannot r/o ischemia; prominent gut uptake adjacent to inf wall on rest images-poss defect is atten, EF 63%  . Hypertension   . Polycystic kidney disease     PAST SURGICAL HISTORY:   Past Surgical History:  Procedure Laterality Date  . COLONOSCOPY  2011   diverticulosis  . HERNIA REPAIR     umbilical  . KNEE SURGERY Left   . medialization laryngoplasty  10/15/2013   Dr Joya Gaskins, [facility]  . TONSILLECTOMY AND ADENOIDECTOMY      SOCIAL HISTORY:   Social History   Socioeconomic History  . Marital status: Married    Spouse name: Not on file  . Number of children: Not on file  . Years of education: Not on file  . Highest education level: Not on file  Social Needs  . Financial resource strain: Not on file  . Food insecurity - worry: Not on file  . Food insecurity - inability: Not on file  . Transportation needs - medical: Not on file  . Transportation needs - non-medical: Not on file  Occupational History  . 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 Smoking status: Never Smoker  . Smokeless tobacco: Never Used  Substance and Sexual Activity  .  Alcohol use: Yes    Alcohol/week: 0.0 oz    Comment:  2-3 beers / month  . Drug use: No  . Sexual activity: Not on file  Other Topics Concern  . Not on file  Social History Narrative  . Not on file    FAMILY HISTORY:   Family Status  Relation Name Status  . Mother  Deceased       heart disease, dementia  . Father  Deceased       HTN, ulcers  . Sister  Deceased       breast cancer  . Brother  Deceased       cancer tongue/cheek, heart disease  . Sister  Deceased       stroke, GERD  . Brother  Alive       DJD  . Child  Alive       2 daughters, breast CA hx  . Sister  (Not Specified)  . Brother  (Not Specified)  . Daughter  (Not Specified)  . Sister  (Not Specified)  . Brother  (Not Specified)    ROS:   A complete 10 system review of systems was obtained and was unremarkable apart from what is mentioned above.  PHYSICAL EXAMINATION:    VITALS:   Vitals:   03/12/17 1336  BP: (!) 142/78  Pulse: 68  SpO2: 97%  Weight: 163 lb (73.9 kg)  Height: 5\' 11"  (1.803 m)     GEN:  The patient appears stated age and is in NAD. HEENT:  Normocephalic, atraumatic.  The mucous membranes are moist. The superficial temporal arteries are without ropiness or tenderness.  There are no tongue fasciculations. CV:  RRR Lungs:  CTAB Neck/HEME:  There are no carotid bruits bilaterally.  Neurological examination:  Orientation:  Montreal Cognitive Assessment  02/04/2017  Visuospatial/ Executive (0/5) 3  Naming (0/3) 1  Attention: Read list of digits (0/2) 2  Attention: Read list of letters (0/1) 1  Attention: Serial 7 subtraction starting at 100 (0/3) 1  Language: Repeat phrase (0/2) 2  Language : Fluency (0/1) 0  Abstraction (0/2) 2  Delayed Recall (0/5) 2  Orientation (0/6) 6  Total 20  Adjusted Score (based on education) 20   Cranial nerves: There is good facial symmetry but there is asymmetric blink. There is slight L ptosis vs pseudoptosis (likely pseudoptosis from lid lag on  the left).  Pupils are equal round and reactive to light bilaterally. Fundoscopic exam reveals clear margins bilaterally. Extraocular muscles are intact. The visual fields are full to confrontational testing. The speech is fluent and clear.   He is hypophonic.   Soft palate rises symmetrically and there is no tongue deviation. Hearing is intact to conversational tone. Sensation: Sensation is intact to light and pinprick throughout (facial, trunk, extremities). Vibration is absent at the bilateral big toe and decreased at the ankle and knee.  There is no extinction with double simultaneous stimulation. There is no sensory dermatomal level identified. Motor: Strength is 5/5 in the bilateral upper and lower extremities.   Shoulder shrug is equal and symmetric.  There is no pronator drift.  There are no fasciculations noted across the legs, arms, chest or back.   Movement examination: Tone: There is mild increased tone bilaterally Abnormal movements: none even with distraction procedures.  No rest or postural tremor noted Coordination:  There is mild trouble with  alternation of supination/pronation of the forearm bilaterally.   Gait and Station: The patient has no difficulty arising out of a deep-seated chair without the use of the hands. The patient's stride length is normal.  He is stooped and has decreased arm swing bilaterally  Lab Results  Component Value Date   TSH 1.61 11/14/2016   Lab Results  Component Value Date   VITAMINB12 361 11/14/2016      Chemistry      Component Value Date/Time   NA 138 11/14/2016 1516   K 3.7 11/14/2016 1516   CL 101 11/14/2016 1516   CO2 31 11/14/2016 1516   BUN 28 (H) 11/14/2016 1516   CREATININE 1.70 (H) 11/14/2016 1516      Component Value Date/Time   CALCIUM 9.0 11/14/2016 1516   ALKPHOS 63 11/14/2016 1516   AST 15 11/14/2016 1516   ALT 10 11/14/2016 1516   BILITOT 0.9 11/14/2016 1516          ASSESSMENT/PLAN:  1.  Memory loss  -He has  neurocognitive testing scheduled in May.  I still think pseudodementia is the biggest issue, as he is taking care of wife full-time with dementia.  2.  Peripheral neuropathy  -The patient has clinical examination evidence of a diffuse peripheral neuropathy, which certainly can affect gait and balance.  We discussed safety associated with peripheral neuropathy.  We discussed balance therapy and the importance of ambulatory assistive device for balance assistance.  3.  Parkinsonism  -doesn't meet criteria for PD but has some features of parkinsonism.  Spreading levodopa out too far and told him to take it at 7 AM/11 AM/3:57 PM and add carbidopa/levodopa 25/100 CR at bedtime, to see if that will help her restlessness at bedtime.  I do think some of this restlessness may be neuropathy, but others of it sounds more like restless  leg.  4.  Depression  -lexapro not helping.  As last time, I told him that perhaps changing Lexapro to mirtazapine would be of value, given difficulties with sleep.  He was told to make a follow-up appointment with his primary care physician to discuss.  5.  Follow up is anticipated in the next few months, sooner should new neurologic issues arise.  Much greater than 50% of this visit was spent in counseling and coordinating care.  Total face to face time:  25 min

## 2017-03-13 ENCOUNTER — Telehealth: Payer: Self-pay | Admitting: Neurology

## 2017-03-13 NOTE — Telephone Encounter (Signed)
Patient called regarding the Carbidopa Levodopa medication he was just prescribed. He said the bottle and the handout he was given at appointment have different directions on them. He would like to talk with you for the correct directions. Please Call. Thanks

## 2017-03-13 NOTE — Telephone Encounter (Addendum)
Left message on machine for patient to call back.  Spoke with patient and CR Levodopa is written for twice daily. Made patient aware this is an error and just take one at bedtime.

## 2017-03-15 ENCOUNTER — Telehealth: Payer: Self-pay | Admitting: Neurology

## 2017-03-15 ENCOUNTER — Ambulatory Visit: Payer: Medicare Other | Admitting: Internal Medicine

## 2017-03-15 ENCOUNTER — Encounter: Payer: Self-pay | Admitting: Internal Medicine

## 2017-03-15 ENCOUNTER — Telehealth: Payer: Self-pay | Admitting: Internal Medicine

## 2017-03-15 DIAGNOSIS — G4700 Insomnia, unspecified: Secondary | ICD-10-CM

## 2017-03-15 DIAGNOSIS — F419 Anxiety disorder, unspecified: Secondary | ICD-10-CM | POA: Diagnosis not present

## 2017-03-15 DIAGNOSIS — F329 Major depressive disorder, single episode, unspecified: Secondary | ICD-10-CM | POA: Diagnosis not present

## 2017-03-15 DIAGNOSIS — A048 Other specified bacterial intestinal infections: Secondary | ICD-10-CM | POA: Diagnosis not present

## 2017-03-15 DIAGNOSIS — K219 Gastro-esophageal reflux disease without esophagitis: Secondary | ICD-10-CM

## 2017-03-15 DIAGNOSIS — F32A Depression, unspecified: Secondary | ICD-10-CM

## 2017-03-15 MED ORDER — OMEPRAZOLE 40 MG PO CPDR: 40.0000 mg | DELAYED_RELEASE_CAPSULE | Freq: Every day | ORAL | 3 refills | Status: DC

## 2017-03-15 NOTE — Telephone Encounter (Signed)
Patient has made an appointment for today with Dr.Crawford for his heartburn issues.

## 2017-03-15 NOTE — Telephone Encounter (Signed)
Spoke with patient. He states that he is unable to sleep due to a "burning feeling in [his] esophagus". He states he has had issues with GERD for years, but only taking something over the counter. I instructed him to call his PCP to get direction on treatment.

## 2017-03-15 NOTE — Patient Instructions (Addendum)
We have sent in the full strength of the prilosec to take 1 pill daily.   If you are interested in the sleeping medicine let us know.

## 2017-03-15 NOTE — Assessment & Plan Note (Signed)
Rx for prilosec 40 mg daily for better efficacy. If this is related to medication change would expect to resolve in 1-2 weeks.

## 2017-03-15 NOTE — Telephone Encounter (Signed)
Patient called and needs to speak with you regarding the change with his medications. Thanks

## 2017-03-15 NOTE — Progress Notes (Signed)
   Subjective:    Patient ID: Henry Adams, male    DOB: 1940/06/09, 77 y.o.   MRN: [id number]  HPI The patient is a 77 YO man coming in for some new concerns about his stomach. He has started levodopa/carbidopa CR in the last several days. Since that time he is having some pain in his stomach at night time. Midline at the top of his stomach. He denies taking anything for it. Normally takes prilosec over the counter and changed from the higher prescription dose about 1 year ago. Denies nausea or vomiting. Denies abdominal pain during the day. Denies constipation, diarrhea, blood in stool.  Also having some sleeping problems (tried [place] and it worked for a couple days so he stopped taking it worried about addiction, then he tried it again and it did not help, he has the sensation of nerve pain and wandering legs and if he does not get up and walk around this is worse) and depression (taking lexapro 20 mg daily, Dr. Carles Collet his neurologist wants his PCP to consider changing to remeron, denies SI/HI, had some benefit from lexapro initially but now it is not helping much).   Review of Systems  Constitutional: Negative.   HENT: Negative.   Eyes: Negative.   Respiratory: Negative for cough, chest tightness and shortness of breath.   Cardiovascular: Negative for chest pain, palpitations and leg swelling.  Gastrointestinal: Positive for abdominal pain. Negative for abdominal distention, constipation, diarrhea, nausea and vomiting.  Musculoskeletal: Negative.   Skin: Negative.   Neurological: Negative.   Psychiatric/Behavioral: Positive for decreased concentration, dysphoric mood and sleep disturbance. Negative for agitation, behavioral problems, hallucinations, self-injury and suicidal ideas. The patient is not nervous/anxious and is not hyperactive.       Objective:   Physical Exam  Constitutional: He is oriented to person, place, and time. He appears well-developed and well-nourished.  HENT:  Head:  Normocephalic and atraumatic.  Eyes: EOM are normal.  Neck: Normal range of motion.  Cardiovascular: Normal rate and regular rhythm.  Pulmonary/Chest: Effort normal and breath sounds normal. No respiratory distress. He has no wheezes. He has no rales.  Abdominal: Soft. Bowel sounds are normal. He exhibits no distension. There is no tenderness. There is no rebound.  Musculoskeletal: He exhibits no edema.  Neurological: He is alert and oriented to person, place, and time. Coordination normal.  Skin: Skin is warm and dry.  Psychiatric:  Some confusion with details   Vitals:   03/15/17 1509  BP: 120/72  Pulse: 71  Temp: 97.6 F (36.4 C)  TempSrc: Oral  SpO2: 98%  Weight: 164 lb (74.4 kg)  Height: 5\' 11"  (1.803 m)      Assessment & Plan:

## 2017-03-15 NOTE — Assessment & Plan Note (Signed)
He is not taking [place] and not working now. Offered trazodone as this can help more with depression and sleep to add on. This is also not habit forming. Declined at this time but they will think about it and call back if wanted.

## 2017-03-15 NOTE — Telephone Encounter (Signed)
Copied from [place] [id number]. Topic: Quick Communication - See Telephone Encounter >> Mar 15, 2017 10:43 AM Lolita Rieger, RMA wrote: CRM for notification. See Telephone encounter for:  Pt called and would like to be placed back on omeprazole 40 mg pt [facility] in [facility] 03/15/17.

## 2017-03-15 NOTE — Assessment & Plan Note (Signed)
His neurologist has recommended change from lexapro to remeron. We talked about the different mechanism of action of the two medications and how remeron does sometimes help people to sleep but will likely not help with his leg wandering sensation. Will forward to PCP and allow them to make that decision together. They will follow up with PCP.

## 2017-03-16 ENCOUNTER — Other Ambulatory Visit: Payer: Self-pay

## 2017-03-16 ENCOUNTER — Ambulatory Visit (INDEPENDENT_AMBULATORY_CARE_PROVIDER_SITE_OTHER): Payer: Medicare Other

## 2017-03-16 ENCOUNTER — Ambulatory Visit (HOSPITAL_COMMUNITY)
Admission: EM | Admit: 2017-03-16 | Discharge: 2017-03-16 | Disposition: A | Discharge status: Home / Self Care | Payer: Medicare Other | Attending: Internal Medicine | Admitting: Internal Medicine

## 2017-03-16 DIAGNOSIS — W108XXA Fall (on) (from) other stairs and steps, initial encounter: Secondary | ICD-10-CM | POA: Diagnosis not present

## 2017-03-16 DIAGNOSIS — M25562 Pain in left knee: Secondary | ICD-10-CM

## 2017-03-16 DIAGNOSIS — M179 Osteoarthritis of knee, unspecified: Secondary | ICD-10-CM | POA: Diagnosis not present

## 2017-03-16 MED ORDER — WALKER MISC: 1.0000 | Freq: Every day | 0 refills | Status: DC

## 2017-03-16 NOTE — ED Triage Notes (Signed)
Per pt he fell and landed on his left knee

## 2017-03-16 NOTE — ED Notes (Signed)
Error in documentation.  Knee sleeve not given; Ace wrap applied per PA.  Crutches given

## 2017-03-16 NOTE — ED Provider Notes (Signed)
[place]    CSN: [id number] Arrival date & time: 03/16/17  1333     History   Chief Complaint Chief Complaint  Patient presents with  . Fall    HPI Henry Adams is a 77 y.o. male   history of hypertension, GERD, kidney disease, Parkinsonism, presenting today with left knee pain.  States he was walking down stairs of a house when he felt like his left knee gave out.  He denies falling, he was able to grab railings and family members assisted him down the rest of the stairs.  Did not hit head or lose consciousness.  Since he has had pain with weightbearing and feeling like he cannot put any weight on his knee.  States he has had sensations of instability for a while now and occasionally feels like his knee goes out of place and comes right back in.  Denies any numbness or tingling.  Patient denies any upper extremity weakness, changes in vision, headache at the time of his knee giving out.  Daughter states that he has not had any changes from his baseline in terms of speech and mental status.  HPI  Past Medical History:  Diagnosis Date  . Allergy    perennial  . CKD (chronic kidney disease)   . GERD (gastroesophageal reflux disease)   . HLD (hyperlipidemia)   . Hx of cardiovascular stress test    ETT-Myoview 6/14: probable low risk study, inf defect with normal inf wall motion-cannot r/o ischemia; prominent gut uptake adjacent to inf wall on rest images-poss defect is atten, EF 63%  . Hypertension   . Polycystic kidney disease     Patient Active Problem List   Diagnosis Date Noted  . Age-related cognitive decline 01/09/2017  . Anxiety and depression 10/24/2016  . Insomnia 10/24/2016  . BPH associated with nocturia 08/11/2015  . Routine general medical examination at a health care facility 08/11/2015  . Coarse tremor 04/04/2015  . Vocal cord dysfunction 11/04/2013  . Essential hypertension 04/26/2007  . Allergic rhinitis 04/26/2007  . DEGENERATIVE JOINT  DISEASE 04/26/2007  . G E R D 08/05/2006  . Helicobacter pylori infection 05/30/2006  . Hyperlipidemia with target LDL less than 160 05/30/2006  . Deficiency anemia 05/30/2006  . Polycystic kidney 05/30/2006  . Diverticulosis of large intestine 07/15/2001    Past Surgical History:  Procedure Laterality Date  . COLONOSCOPY  2011   diverticulosis  . HERNIA REPAIR     umbilical  . KNEE SURGERY Left   . medialization laryngoplasty  10/15/2013   Dr Joya Gaskins, [facility]  . TONSILLECTOMY AND ADENOIDECTOMY         Home Medications    Prior to Admission medications   Medication Sig Start Date End Date Taking? Authorizing Provider  azelastine (ASTELIN) 137 MCG/SPRAY nasal spray Place 1 spray into both nostrils 2 (two) times daily. Use in each nostril as directed   Yes [provider]  carbidopa-levodopa (SINEMET IR) 25-100 MG tablet Take 1 tablet by mouth 3 (three) times daily. 02/04/17  Yes Tat, Eustace Quail, DO  Carbidopa-Levodopa ER (SINEMET CR) 25-100 MG tablet controlled release Take 1 tablet by mouth 2 (two) times daily. 03/12/17  Yes Tat, Eustace Quail, DO  escitalopram (LEXAPRO) 20 MG tablet Take 1 tablet (20 mg total) daily by mouth. 12/05/16  Yes [name], MD  fexofenadine (ALLEGRA) 180 MG tablet Take 180 mg by mouth daily.     Yes [provider]  hydrochlorothiazide (MICROZIDE) 12.5  MG capsule  06/26/15  Yes [provider]  omeprazole (PRILOSEC) 40 MG capsule Take 1 capsule (40 mg total) by mouth daily. 03/15/17  Yes Hoyt Koch, MD  rosuvastatin (CRESTOR) 5 MG tablet Take 1 tablet (5 mg total) by mouth daily. 11/17/16  Yes [name], MD  telmisartan (MICARDIS) 40 MG tablet Take 20 mg by mouth daily.    Yes [provider]  vitamin B-12 (CYANOCOBALAMIN) 1000 MCG tablet Take 1,000 mcg by mouth daily.   Yes [provider]  vitamin C (ASCORBIC ACID) 500 MG tablet Take 1,000 mg by mouth daily.    Yes [provider]    aspirin 81 MG tablet Take 81 mg by mouth daily.      [provider]  Misc. Devices (WALKER) MISC 1 each by Does not apply route daily. 03/16/17   Caedmon Louque C, PA-C  zolpidem (AMBIEN) 5 MG tablet Take 1 tablet (5 mg total) by mouth at bedtime as needed for sleep. 10/24/16   Biagio Borg, MD    Family History Family History  Problem Relation Age of Onset  . Hypertension Father   . Cancer Sister        breast  . Heart disease Brother        heart transplant  . Cancer Daughter        breast  . Stroke Sister        >90  . Skin cancer Brother        squamous cell    Social History Social History   Tobacco Use  . Smoking status: Never Smoker  . Smokeless tobacco: Never Used  Substance Use Topics  . Alcohol use: Yes    Alcohol/week: 0.0 oz    Comment:  2-3 beers / month  . Drug use: No     Allergies   Iodine; Diazepam; Oxycodone; Pollen extract; Tamsulosin; Doxazosin; Etodolac; and Tape   Review of Systems Review of Systems  Constitutional: Negative for fever.  Respiratory: Negative for shortness of breath.   Cardiovascular: Negative for chest pain.  Gastrointestinal: Negative for nausea and vomiting.  Musculoskeletal: Positive for arthralgias and gait problem. Negative for back pain and myalgias.  Skin: Negative for rash and wound.  Neurological: Positive for weakness. Negative for syncope, numbness and headaches.     Physical Exam Triage Vital Signs ED Triage Vitals  Enc Vitals Group     BP 03/16/17 1507 125/81     Pulse Rate 03/16/17 1507 69     Resp --      Temp 03/16/17 1507 98.3 F (36.8 C)     Temp Source 03/16/17 1507 Oral     SpO2 03/16/17 1507 96 %     Weight --      Height --      Head Circumference --      Peak Flow --      Pain Score 03/16/17 1506 4     Pain Loc --      Pain Edu? --      Excl. in [place]? --    No data found.  Updated Vital Signs BP 125/81 (BP Location: Left Arm)   Pulse 69   Temp 98.3 F (36.8 C) (Oral)    SpO2 96%   Visual Acuity Right Eye Distance:   Left Eye Distance:   Bilateral Distance:    Right Eye Near:   Left Eye Near:    Bilateral Near:     Physical Exam  Constitutional: He appears well-developed and well-nourished.  HENT:  Head: Normocephalic and atraumatic.  Eyes: Conjunctivae are normal.  Neck: Neck supple.  Cardiovascular: Normal rate.  Pulmonary/Chest: Effort normal. No respiratory distress.  Musculoskeletal: He exhibits no edema.  Patient requiring assistant to move from wheelchair to exam table, minimally weightbearing on left leg.  Patient does have full range of motion at left knee, although slowed.  Pain with flexion.  No obvious deformity or erythema.  Tenderness to palpation of superior lateral aspect of knee.  No laxity appreciated with varus and valgus stress, anterior/posterior drawer, no laxity with Lachman's.  Patient experiencing pain with McMurray's, no popping, clicking or locking appreciated with McMurray's.  Neurological: He is alert.  Cranial nerves II through XII grossly intact, strength 5/5 at shoulders hips and knees bilaterally.  Patient with shuffling intentional gait at baseline.  Skin: Skin is warm and dry.  Psychiatric: He has a normal mood and affect.  Nursing note and vitals reviewed.    UC Treatments / Results  Labs (all labs ordered are listed, but only abnormal results are displayed) Labs Reviewed - No data to display  EKG  EKG Interpretation None       Radiology Dg Knee Complete 4 Views Left  Result Date: 03/16/2017 CLINICAL DATA:  Left knee gave out today. Fall wall descending steps. Initial encounter. EXAM: LEFT KNEE - COMPLETE 4+ VIEW COMPARISON:  None. FINDINGS: Mild degenerative changes are present at the patellofemoral component of the knee. The knee is located. No acute bone or soft tissue abnormality is present. Vascular calcifications are evident. IMPRESSION: 1. Mild degenerative changes are most evident within the  patellofemoral compartment. 2. No acute abnormality. Electronically Signed   By: San Morelle M.D.   On: 03/16/2017 15:42    Procedures Procedures (including critical care time)  Medications Ordered in UC Medications - No data to display   Initial Impression / Assessment and Plan / UC Course  I have reviewed the triage vital signs and the nursing notes.  Pertinent labs & imaging results that were available during my care of the patient were reviewed by me and considered in my medical decision making (see chart for details).     X-ray showing no fractures, did show degenerative changes of patellofemoral compartment.  Likely cause of pain, does not seem likely because of instability though.  Will have patient follow-up with PCP and or ortho for further evaluation of instability.  Patient provided knee sleeve initially to help with support, medium appeared to loose, small felt too tight.  Switch to Ace bandage to use as needed.  Patient ultimately would benefit from a walker given his aversion to apply much weight to his left leg.  In the meantime we did a trial with one crutch on the opposite side.  Patient was able to use successfully, turning with one crutch was not as smooth.  Advised daughter that I do not feel comfortable with him using the one crutch on his own, but this may help him and her throughout the weekend.  Provided prescription for a walker.  Medical supply store is closed until Monday, advised a walker would probably aid him better than the crutch.  Do not feel he would be safe on 2 crutches.  Tylenol for pain as patient has kidney disease.  Ice and heat as needed.  Final Clinical Impressions(s) / UC Diagnoses   Final diagnoses:  Acute pain of left knee    ED Discharge Orders  Ordered    Walker standard     03/16/17 1625    Misc. Devices [facility]) MISC  Daily     03/16/17 1627       Controlled Substance Prescriptions [place] Controlled Substance Registry  consulted? Not Applicable   [name], [place] 03/16/17 2201

## 2017-03-18 ENCOUNTER — Ambulatory Visit: Payer: Medicare Other | Admitting: Family

## 2017-03-18 ENCOUNTER — Telehealth: Payer: Self-pay | Admitting: Internal Medicine

## 2017-03-18 ENCOUNTER — Encounter: Payer: Self-pay | Admitting: Family

## 2017-03-18 ENCOUNTER — Other Ambulatory Visit (INDEPENDENT_AMBULATORY_CARE_PROVIDER_SITE_OTHER): Payer: Medicare Other

## 2017-03-18 VITALS — BP 124/74 | HR 74 | Temp 98.1°F | Ht 71.0 in | Wt 163.0 lb

## 2017-03-18 DIAGNOSIS — G4700 Insomnia, unspecified: Secondary | ICD-10-CM

## 2017-03-18 DIAGNOSIS — M25561 Pain in right knee: Secondary | ICD-10-CM

## 2017-03-18 DIAGNOSIS — M25562 Pain in left knee: Secondary | ICD-10-CM | POA: Diagnosis not present

## 2017-03-18 DIAGNOSIS — G8929 Other chronic pain: Secondary | ICD-10-CM | POA: Diagnosis not present

## 2017-03-18 LAB — COMPREHENSIVE METABOLIC PANEL
ALBUMIN: 3.8 g/dL (ref 3.5–5.2)
ALT: 9 U/L (ref 0–53)
AST: 16 U/L (ref 0–37)
Alkaline Phosphatase: 70 U/L (ref 39–117)
BUN: 27 mg/dL — AB (ref 6–23)
CHLORIDE: 99 meq/L (ref 96–112)
CO2: 31 meq/L (ref 19–32)
CREATININE: 1.63 mg/dL — AB (ref 0.40–1.50)
Calcium: 9.2 mg/dL (ref 8.4–10.5)
GFR: 43.59 mL/min — ABNORMAL LOW (ref >60.00)
Glucose, Bld: 106 mg/dL — ABNORMAL HIGH (ref 70–99)
Potassium: 4 mEq/L (ref 3.5–5.1)
SODIUM: 136 meq/L (ref 135–145)
Total Bilirubin: 1 mg/dL (ref 0.2–1.2)
Total Protein: 7.2 g/dL (ref 6.0–8.3)

## 2017-03-18 MED ORDER — TRAZODONE HCL 50 MG PO TABS: 50.0000 mg | ORAL_TABLET | Freq: Every evening | ORAL | 0 refills | Status: DC | PRN

## 2017-03-18 NOTE — Telephone Encounter (Signed)
Noted message has already been sent to Dr. Sharlet Salina about the Trazodone medication

## 2017-03-18 NOTE — Telephone Encounter (Signed)
See request below for Ambien.  Per patient it was discussed with Dr. Ronnald Ramp during last OV.

## 2017-03-18 NOTE — Telephone Encounter (Signed)
Copied from [place] [id number]. Topic: Quick Communication - Rx Refill/Question >> Mar 18, 2017  9:11 AM Scherrie Gerlach wrote: Medication: zolpidem (AMBIEN) 5 MG tablet Pt saw Dr Sharlet Salina 2/22 and she asked the pt if he needed anything for sleep.  Pt declined then, but fell over the weekend, hurt knee. So pt was hoping to take the dr up on that request. Pt did go to UC this weekend for the knee injury, and I sent a message the pt wants a mri because xrays did not shouw anything. But due to this pain, pt would like a refill of the [place]. [place], [place] [phone] (Phone) [phone] (Fax)

## 2017-03-18 NOTE — Progress Notes (Signed)
Henry Adams is a 77 y.o. male with the following history as recorded in EpicCare:  Patient Active Problem List   Diagnosis Date Noted  . Age-related cognitive decline 01/09/2017  . Anxiety and depression 10/24/2016  . Insomnia 10/24/2016  . BPH associated with nocturia 08/11/2015  . Routine general medical examination at a health care facility 08/11/2015  . Coarse tremor 04/04/2015  . Vocal cord dysfunction 11/04/2013  . Essential hypertension 04/26/2007  . Allergic rhinitis 04/26/2007  . DEGENERATIVE JOINT DISEASE 04/26/2007  . G E R D 08/05/2006  . Helicobacter pylori infection 05/30/2006  . Hyperlipidemia with target LDL less than 160 05/30/2006  . Deficiency anemia 05/30/2006  . Polycystic kidney 05/30/2006  . Diverticulosis of large intestine 07/15/2001    Current Outpatient Medications  Medication Sig Dispense Refill  . aspirin 81 MG tablet Take 81 mg by mouth daily.      [name] azelastine (ASTELIN) 137 MCG/SPRAY nasal spray Place 1 spray into both nostrils 2 (two) times daily. Use in each nostril as directed    . carbidopa-levodopa (SINEMET IR) 25-100 MG tablet Take 1 tablet by mouth 3 (three) times daily. 90 tablet 3  . Carbidopa-Levodopa ER (SINEMET CR) 25-100 MG tablet controlled release Take 1 tablet by mouth 2 (two) times daily. 90 tablet 1  . escitalopram (LEXAPRO) 20 MG tablet Take 1 tablet (20 mg total) daily by mouth. 90 tablet 1  . fexofenadine (ALLEGRA) 180 MG tablet Take 180 mg by mouth daily.      . hydrochlorothiazide (MICROZIDE) 12.5 MG capsule   3  . Misc. Devices (WALKER) MISC 1 each by Does not apply route daily. 1 each 0  . omeprazole (PRILOSEC) 40 MG capsule Take 1 capsule (40 mg total) by mouth daily. 90 capsule 3  . rosuvastatin (CRESTOR) 5 MG tablet Take 1 tablet (5 mg total) by mouth daily. 90 tablet 1  . telmisartan (MICARDIS) 40 MG tablet Take 20 mg by mouth daily.     . vitamin B-12 (CYANOCOBALAMIN) 1000 MCG tablet Take 1,000 mcg by mouth daily.    .  vitamin C (ASCORBIC ACID) 500 MG tablet Take 1,000 mg by mouth daily.     [name] zolpidem (AMBIEN) 5 MG tablet Take 1 tablet (5 mg total) by mouth at bedtime as needed for sleep. 30 tablet 0  . traZODone (DESYREL) 50 MG tablet Take 1-2 tablets (50-100 mg total) by mouth at bedtime as needed for sleep. 30 tablet 0   No current facility-administered medications for this visit.     Allergies: Iodine; Diazepam; Oxycodone; Pollen extract; Tamsulosin; Doxazosin; Etodolac; and Tape  Past Medical History:  Diagnosis Date  . Allergy    perennial  . CKD (chronic kidney disease)   . GERD (gastroesophageal reflux disease)   . HLD (hyperlipidemia)   . Hx of cardiovascular stress test    ETT-Myoview 6/14: probable low risk study, inf defect with normal inf wall motion-cannot r/o ischemia; prominent gut uptake adjacent to inf wall on rest images-poss defect is atten, EF 63%  . Hypertension   . Polycystic kidney disease     Past Surgical History:  Procedure Laterality Date  . COLONOSCOPY  2011   diverticulosis  . HERNIA REPAIR     umbilical  . KNEE SURGERY Left   . medialization laryngoplasty  10/15/2013   Dr Joya Gaskins, [facility]  . TONSILLECTOMY AND ADENOIDECTOMY      Family History  Problem Relation Age of Onset  . Hypertension Father   .  Cancer Sister        breast  . Heart disease Brother        heart transplant  . Cancer Daughter        breast  . Stroke Sister        >18  . Skin cancer Brother        squamous cell    Social History   Tobacco Use  . Smoking status: Never Smoker  . Smokeless tobacco: Never Used  Substance Use Topics  . Alcohol use: Yes    Alcohol/week: 0.0 oz    Comment:  2-3 beers / month    Subjective:  Patient presents with concerns for chronic/ worsening right knee pain; feels that right knee is unstable; has felt "unsteady and unstable" for the past 6 months; on Saturday, knee gave out while walking downstairs and actually fell; was seen at ER on 2/23 with the knee  pain; X-rays were normal but was told to see his PCP to discuss getting MRI; his daughter accompanies him today;   Objective:  Vitals:   03/18/17 1600  BP: 124/74  Pulse: 74  Temp: 98.1 F (36.7 C)  TempSrc: Oral  SpO2: 98%  Weight: 163 lb (73.9 kg)  Height: _0  (1.803 m)    General: Well developed, well nourished, in no acute distress  Skin : Warm and dry.  Lungs: Respirations unlabored; clear to auscultation bilaterally without wheeze, rales, rhonchi  Musculoskeletal: No deformities; no active joint inflammation; left knee tender to touch; limited exam done due to pain Extremities: No edema, cyanosis, clubbing  Vessels: Symmetric bilaterally  Neurologic: Alert and oriented; speech intact; face symmetrical; using walker for stability today;  Assessment:  1. Chronic pain of right knee   2. Insomnia, unspecified type     Plan:  1. MRI ordered as requested; follow-up to be determined;  2. Patient has been given Rx for Trazodone as discussed with provider at previous visit; follow-up with his PCP if insomnia persists.   No Follow-up on file.  Orders Placed This Encounter  Procedures  . MR Knee Right Wo Contrast    Standing Status:   Future    Standing Expiration Date:   05/17/2018    Order Specific Question:   What is the patient's sedation requirement?    Answer:   No Sedation    Order Specific Question:   Does the patient have a pacemaker or implanted devices?    Answer:   No    Order Specific Question:   Preferred imaging location?    Answer:   [facility] (table limit-550lbs)    Order Specific Question:   Radiology Contrast Protocol - do NOT remove file path    Answer:   \\[email or web address]  . Comp Met (CMET)    Standing Status:   Future    Number of Occurrences:   1    Standing Expiration Date:   03/18/2018    Requested Prescriptions    No prescriptions requested or ordered in this encounter

## 2017-03-18 NOTE — Telephone Encounter (Signed)
Sent in trazodone. Take 1 pill at night time for at least 1 week. If no results call back.

## 2017-03-18 NOTE — Telephone Encounter (Signed)
FYI:  I spoke with the patient's daughter and discussed this issue. The prescription was based on the conversation from the visit with Dr Sharlet Salina. A message has been sent to Dr. Nathanial Millman assistant for help with this refill (see phone note from 03/18/17).

## 2017-03-18 NOTE — Telephone Encounter (Signed)
Patient informed at Henry Adams with Mickel Baas

## 2017-03-18 NOTE — Telephone Encounter (Signed)
Pt is now wanting to try to trazodone prescription that was discussed at his visit last week. Can this be sent in for him?

## 2017-03-26 ENCOUNTER — Ambulatory Visit
Admission: RE | Admit: 2017-03-26 | Discharge: 2017-03-26 | Disposition: A | Discharge status: Home / Self Care | Payer: Medicare Other | Source: Ambulatory Visit | Attending: Family | Admitting: Family

## 2017-03-26 ENCOUNTER — Ambulatory Visit
Admission: RE | Admit: 2017-03-26 | Discharge: 2017-03-26 | Disposition: A | Discharge status: Home / Self Care | Payer: Medicare Other | Source: Ambulatory Visit | Attending: Family Medicine | Admitting: Family Medicine

## 2017-03-26 DIAGNOSIS — M25562 Pain in left knee: Principal | ICD-10-CM

## 2017-03-26 DIAGNOSIS — G8929 Other chronic pain: Secondary | ICD-10-CM

## 2017-03-26 DIAGNOSIS — M25561 Pain in right knee: Principal | ICD-10-CM

## 2017-03-26 DIAGNOSIS — S8992XA Unspecified injury of left lower leg, initial encounter: Secondary | ICD-10-CM | POA: Diagnosis not present

## 2017-03-26 NOTE — Addendum Note (Signed)
Addended by: Marcina Millard on: 03/26/2017 04:15 PM   Modules accepted: Orders

## 2017-03-28 ENCOUNTER — Telehealth: Payer: Self-pay | Admitting: Internal Medicine

## 2017-03-28 NOTE — Telephone Encounter (Signed)
Daughter is on DPR 

## 2017-03-28 NOTE — Telephone Encounter (Signed)
Copied from [place] [id number]. Topic: Quick Communication - See Telephone Encounter >> Mar 28, 2017  1:35 PM Cleaster Corin, NT wrote: CRM for notification. See Telephone encounter for:   03/28/17. Pt. Daughter [name] Ranson calling to get results from pt. Mri. She can be reached at [phone]

## 2017-03-29 NOTE — Telephone Encounter (Signed)
Patients daughter is calling for the MRI results for her father. If someone could give her a call back about this at [phone]

## 2017-03-30 ENCOUNTER — Encounter: Payer: Self-pay | Admitting: Internal Medicine

## 2017-03-30 DIAGNOSIS — M170 Bilateral primary osteoarthritis of knee: Secondary | ICD-10-CM | POA: Diagnosis not present

## 2017-03-30 DIAGNOSIS — M79645 Pain in left finger(s): Secondary | ICD-10-CM | POA: Diagnosis not present

## 2017-03-30 NOTE — Telephone Encounter (Signed)
Left VM for patient. If he calls back please have him speak with a nurse/CMA and shows degenerative changes within the knee that would be expected for someone his age. We can try injection therapy or needle injection is having pain..   If any questions then please take the best time and phone number to call and I will try to call him back.   Rosemarie Ax, MD [facility] 03/30/2017, 11:47 AM

## 2017-04-01 ENCOUNTER — Telehealth: Payer: Self-pay | Admitting: Family Medicine

## 2017-04-01 NOTE — Telephone Encounter (Signed)
Routing to dr schmitz, please advise what I need to tell patient, and I will call patient back, thanks

## 2017-04-01 NOTE — Telephone Encounter (Signed)
Spoke with patient's daughter about MRi results. Can follow up with me for injection therapy or the consideration of PT.   Rosemarie Ax, MD [facility] 04/01/2017, 3:05 PM

## 2017-04-01 NOTE — Telephone Encounter (Signed)
Daughter called requesting MRI results for patient.  Daughter is on [place].

## 2017-04-02 ENCOUNTER — Ambulatory Visit: Payer: Medicare Other | Admitting: Family Medicine

## 2017-04-02 ENCOUNTER — Encounter: Payer: Self-pay | Admitting: Family Medicine

## 2017-04-02 DIAGNOSIS — M25562 Pain in left knee: Secondary | ICD-10-CM

## 2017-04-02 NOTE — Progress Notes (Signed)
Henry Adams - 77 y.o. male MRN [id number]  Date of birth: [birth year]  SUBJECTIVE:  Including CC & ROS.  Chief Complaint  Patient presents with  . Knee Pain    Left knee pain    Henry Adams is a 77 y.o. male that is left knee pain. The pain is improved today. The pain is mild in nature. He feels like his knee might give out. No significant swelling. Pain is located on the medial aspect of the knee.  Independent review of the MRI of the left knee on 3/5 shows mild degenerative changes.   Independent review of the left knee xray shows on 2/23 shows mild degenerative changes.   Review of Systems  Constitutional: Negative for fever.  HENT: Negative for congestion.   Respiratory: Negative for cough.   Cardiovascular: Negative for chest pain.  Gastrointestinal: Negative for abdominal pain.  Musculoskeletal: Positive for arthralgias.  Skin: Negative for color change.  Neurological: Negative for numbness.  Hematological: Negative for adenopathy.  Psychiatric/Behavioral: Negative for agitation.    HISTORY: Past Medical, Surgical, Social, and Family History Reviewed & Updated per EMR.   Pertinent Historical Findings include:  Past Medical History:  Diagnosis Date  . Allergy    perennial  . CKD (chronic kidney disease)   . GERD (gastroesophageal reflux disease)   . HLD (hyperlipidemia)   . Hx of cardiovascular stress test    ETT-Myoview 6/14: probable low risk study, inf defect with normal inf wall motion-cannot r/o ischemia; prominent gut uptake adjacent to inf wall on rest images-poss defect is atten, EF 63%  . Hypertension   . Polycystic kidney disease     Past Surgical History:  Procedure Laterality Date  . COLONOSCOPY  2011   diverticulosis  . HERNIA REPAIR     umbilical  . KNEE SURGERY Left   . medialization laryngoplasty  10/15/2013   Dr Joya Gaskins, [facility]  . TONSILLECTOMY AND ADENOIDECTOMY      Allergies  Allergen Reactions  . Iodine     rash  . Diazepam    Mental status changes   . Oxycodone     Mental status changes & nausea  . Pollen Extract Itching  . Tamsulosin     Other reaction(s): Other (See Comments) Unknown   . Doxazosin Rash    agitation  . Etodolac Rash    unknown  . Tape Rash    Certain "Band-Aids"    Family History  Problem Relation Age of Onset  . Hypertension Father   . Cancer Sister        breast  . Heart disease Brother        heart transplant  . Cancer Daughter        breast  . Stroke Sister        >61  . Skin cancer Brother        squamous cell     Social History   Socioeconomic History  . Marital status: Married    Spouse name: Not on file  . Number of children: Not on file  . Years of education: Not on file  . Highest education level: Not on file  Social Needs  . Financial resource strain: Not on file  . Food insecurity - worry: Not on file  . Food insecurity - inability: Not on file  . Transportation needs - medical: Not on file  . Transportation needs - non-medical: Not on file  Occupational History  . 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 Smoking status: Never Smoker  . Smokeless tobacco: Never Used  Substance and Sexual Activity  . Alcohol use: Yes    Alcohol/week: 0.0 oz    Comment:  2-3 beers / month  . Drug use: No  . Sexual activity: Not on file  Other Topics Concern  . Not on file  Social History Narrative  . Not on file     PHYSICAL EXAM:  VS: BP 122/78 (BP Location: Left Arm, Patient Position: Sitting, Cuff Size: Normal)   Pulse 71   Temp 98.6 F (37 C) (Oral)   Ht 5\' 11"  (1.803 m)   Wt 163 lb 1.9 oz (74 kg)   SpO2 98%   BMI 22.75 kg/m  Physical Exam Gen: NAD, alert, cooperative with exam, well-appearing ENT: normal lips, normal nasal mucosa,  Eye: normal EOM, normal conjunctiva and lids CV:  no edema, +2 pedal pulses   Resp: no accessory muscle use, non-labored,   Skin: no rashes, no areas of induration    Neuro: normal tone, normal sensation to touch Psych:  normal insight, alert and oriented MSK:  Left knee:  No obvious effusion  No TTP of the medial or lateral joint line  Normal knee flexion and extension  Normal strength to resistance  No significant instability  Neurovascularly intact   Limited ultrasound: left knee:  No effusion in the SPP  Mild degenerative changes of the medial and lateral joint space   Summary: mild degenerative changes   Ultrasound and interpretation by Clearance Coots, MD            ASSESSMENT & PLAN:   Left knee pain Pain likely related to exacerbation of underlying arthritis  - counseled on supportive care  - ace wrap today  - if no improvement then consider injection.

## 2017-04-02 NOTE — Patient Instructions (Signed)
Take tylenol 650 mg three times a day is the best evidence based medicine we have for arthritis.   Glucosamine sulfate 750mg  twice a day is a supplement that has been shown to help moderate to severe arthritis.  Vitamin D 2000 IU daily  Fish oil 2 grams daily.   Tumeric 500mg  twice daily.   Capsaicin topically up to four times a day may also help with pain . Cortisone injections are an option if these interventions do not seem to make a difference or need more relief.    Water aerobics and cycling with low resistance are the best two types of exercise for arthritis.

## 2017-04-03 DIAGNOSIS — M25562 Pain in left knee: Secondary | ICD-10-CM | POA: Insufficient documentation

## 2017-04-03 NOTE — Assessment & Plan Note (Signed)
Pain likely related to exacerbation of underlying arthritis  - counseled on supportive care  - ace wrap today  - if no improvement then consider injection.

## 2017-04-04 ENCOUNTER — Ambulatory Visit: Payer: Medicare Other | Admitting: Family Medicine

## 2017-04-05 ENCOUNTER — Encounter: Payer: Self-pay | Admitting: Internal Medicine

## 2017-04-05 ENCOUNTER — Ambulatory Visit: Payer: Self-pay | Admitting: *Deleted

## 2017-04-05 ENCOUNTER — Ambulatory Visit: Payer: Medicare Other | Admitting: Internal Medicine

## 2017-04-05 DIAGNOSIS — R600 Localized edema: Secondary | ICD-10-CM | POA: Diagnosis not present

## 2017-04-05 NOTE — Patient Instructions (Addendum)
Elevated your legs when sitting.  Decrease your salt intake.   Try wearing your compression socks.    Call if no improvement

## 2017-04-05 NOTE — Progress Notes (Signed)
Subjective:    Patient ID: Henry Adams, male    DOB: 19-Sep-1940, 77 y.o.   MRN: [id number]  HPI The patient is here for an acute visit.  He has mild chronic LE edema that is very mild and the past few days his swelling has increased.  He has had left knee pain recently after an injury.  He was walking down the stairs a couple of weeks ago and his left knee collapsed.   He has had an MRI and is following with Dr Raeford Razor.  He has been wrapping the knee.  He has neuropathy pain in his legs.  He lives at a senior living facility and they cook. He is not compliant with a low sodium diet.  He ate [place] food last night.    He denies any heart issues.  He has CKD and following with nephrology.   He denies shortness of breath, chest pain or palpitations.  He has had some headaches.          Medications and allergies reviewed with patient and updated if appropriate.  Patient Active Problem List   Diagnosis Date Noted  . Bilateral leg edema 04/05/2017  . Left knee pain 04/03/2017  . Age-related cognitive decline 01/09/2017  . Anxiety and depression 10/24/2016  . Insomnia 10/24/2016  . BPH associated with nocturia 08/11/2015  . Routine general medical examination at a health care facility 08/11/2015  . Coarse tremor 04/04/2015  . Vocal cord dysfunction 11/04/2013  . Essential hypertension 04/26/2007  . Allergic rhinitis 04/26/2007  . DEGENERATIVE JOINT DISEASE 04/26/2007  . G E R D 08/05/2006  . Helicobacter pylori infection 05/30/2006  . Hyperlipidemia with target LDL less than 160 05/30/2006  . Deficiency anemia 05/30/2006  . Polycystic kidney 05/30/2006  . Diverticulosis of large intestine 07/15/2001    Current Outpatient Medications on File Prior to Visit  Medication Sig Dispense Refill  . aspirin 81 MG tablet Take 81 mg by mouth daily.      [name] azelastine (ASTELIN) 137 MCG/SPRAY nasal spray Place 1 spray into both nostrils 2 (two) times daily. Use in each nostril as directed      . carbidopa-levodopa (SINEMET IR) 25-100 MG tablet Take 1 tablet by mouth 3 (three) times daily. 90 tablet 3  . Carbidopa-Levodopa ER (SINEMET CR) 25-100 MG tablet controlled release Take 1 tablet by mouth 2 (two) times daily. 90 tablet 1  . escitalopram (LEXAPRO) 20 MG tablet Take 1 tablet (20 mg total) daily by mouth. 90 tablet 1  . fexofenadine (ALLEGRA) 180 MG tablet Take 180 mg by mouth daily.      . hydrochlorothiazide (MICROZIDE) 12.5 MG capsule   3  . Misc. Devices (WALKER) MISC 1 each by Does not apply route daily. 1 each 0  . omeprazole (PRILOSEC) 40 MG capsule Take 1 capsule (40 mg total) by mouth daily. 90 capsule 3  . rosuvastatin (CRESTOR) 5 MG tablet Take 1 tablet (5 mg total) by mouth daily. 90 tablet 1  . telmisartan (MICARDIS) 40 MG tablet Take 20 mg by mouth daily.     . traZODone (DESYREL) 50 MG tablet Take 1-2 tablets (50-100 mg total) by mouth at bedtime as needed for sleep. 30 tablet 0  . vitamin B-12 (CYANOCOBALAMIN) 1000 MCG tablet Take 1,000 mcg by mouth daily.    . vitamin C (ASCORBIC ACID) 500 MG tablet Take 1,000 mg by mouth daily.     [name] zolpidem (AMBIEN) 5 MG tablet Take 1 tablet (  5 mg total) by mouth at bedtime as needed for sleep. 30 tablet 0   No current facility-administered medications on file prior to visit.     Past Medical History:  Diagnosis Date  . Allergy    perennial  . CKD (chronic kidney disease)   . GERD (gastroesophageal reflux disease)   . HLD (hyperlipidemia)   . Hx of cardiovascular stress test    ETT-Myoview 6/14: probable low risk study, inf defect with normal inf wall motion-cannot r/o ischemia; prominent gut uptake adjacent to inf wall on rest images-poss defect is atten, EF 63%  . Hypertension   . Polycystic kidney disease     Past Surgical History:  Procedure Laterality Date  . COLONOSCOPY  2011   diverticulosis  . HERNIA REPAIR     umbilical  . KNEE SURGERY Left   . medialization laryngoplasty  10/15/2013   Dr Joya Gaskins,  [facility]  . TONSILLECTOMY AND ADENOIDECTOMY      Social History   Socioeconomic History  . Marital status: Married    Spouse name: None  . Number of children: None  . Years of education: None  . Highest education level: None  Social Needs  . Financial resource strain: None  . Food insecurity - worry: None  . Food insecurity - inability: None  . Transportation needs - medical: None  . Transportation needs - non-medical: None  Occupational History  . 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 Smoking status: Never Smoker  . Smokeless tobacco: Never Used  Substance and Sexual Activity  . Alcohol use: Yes    Alcohol/week: 0.0 oz    Comment:  2-3 beers / month  . Drug use: No  . Sexual activity: None  Other Topics Concern  . None  Social History Narrative  . None    Family History  Problem Relation Age of Onset  . Hypertension Father   . Cancer Sister        breast  . Heart disease Brother        heart transplant  . Cancer Daughter        breast  . Stroke Sister        >73  . Skin cancer Brother        squamous cell    Review of Systems  Constitutional: Negative for chills and fever.  Respiratory: Negative for cough, shortness of breath and wheezing.   Cardiovascular: Positive for leg swelling. Negative for chest pain and palpitations.  Gastrointestinal: Negative for abdominal pain.  Endocrine: Positive for cold intolerance.  Genitourinary: Negative for difficulty urinating, dysuria and hematuria.  Neurological: Positive for headaches. Negative for light-headedness.       Objective:   Vitals:   04/05/17 1500  BP: 118/70  Pulse: 78  Temp: 98 F (36.7 C)  SpO2: 98%   BP Readings from Last 3 Encounters:  04/05/17 118/70  04/02/17 122/78  03/18/17 124/74   Wt Readings from Last 3 Encounters:  04/05/17 167 lb 8 oz (76 kg)  04/02/17 163 lb 1.9 oz (74 kg)  03/18/17 163 lb (73.9 kg)   Body mass index is 23.36  kg/m.   Physical Exam    Constitutional: Appears well-developed and well-nourished. No distress.  HENT:  Head: Normocephalic and atraumatic.  Neck: Neck supple. No tracheal deviation present. No thyromegaly present.  No cervical lymphadenopathy Cardiovascular: Normal rate, regular rhythm and normal heart sounds.   No murmur heard. No carotid  bruit.  Trace right ankle edema, 1 + pitting left ankle edema Pulmonary/Chest: Effort normal and breath sounds normal. No respiratory distress. No has no wheezes. No rales.  Skin: Skin is warm and dry. Not diaphoretic.  Psychiatric: Normal mood and affect. Behavior is normal.      Assessment & Plan:    See Problem List for Assessment and Plan of chronic medical problems.

## 2017-04-05 NOTE — Telephone Encounter (Addendum)
Pt states he has experienced increased bilateral leg swelling with the left leg being worse than the right for the past 3 days. Pt states he has experienced leg swelling before but not this bad.Pt states he is also experiencing pain to the back of left calf. Pt denies any other symptoms at this time.Pt states when he presses on his left leg,an indentation is left of his finger. Pt's PCP Dr. Ronnald Ramp is not available for appt. Pt scheduled for appt today with Dr. Quay Burow at 2:30.  Reason for Disposition . [1] Thigh, calf, or ankle swelling AND [2] bilateral AND [3] 1 side is more swollen  Answer Assessment - Initial Assessment Questions 1. ONSET: "When did the swelling start?" (e.g., minutes, hours, days)     3 days ago and gradually getting worse 2. LOCATION: "What part of the leg is swollen?"  "Are both legs swollen or just one leg?"     Mostly in ankle and feet and goes up a little to knee 3. SEVERITY: "How bad is the swelling?" (e.g., localized; mild, moderate, severe)  - Localized - small area of swelling localized to one leg  - MILD pedal edema - swelling limited to foot and ankle, pitting edema < 1/4 inch (6 mm) deep, rest and elevation eliminate most or all swelling  - MODERATE edema - swelling of lower leg to knee, pitting edema > 1/4 inch (6 mm) deep, rest and elevation only partially reduce swelling  - SEVERE edema - swelling extends above knee, facial or hand swelling present     Moderate 4. REDNESS: "Does the swelling look red or infected?"     No 5. PAIN: "Is the swelling painful to touch?" If so, ask: "How painful is it?"   (Scale 1-10; mild, moderate or severe)    Pain in left calf 6. FEVER: "Do you have a fever?" If so, ask: "What is it, how was it measured, and when did it start?"      No 7. CAUSE: "What do you think is causing the leg swelling?"     Pt states he has always had  8. MEDICAL HISTORY: "Do you have a history of heart failure, kidney disease, liver failure, or  cancer?"     Polycystic kidney disease, HTN 9. RECURRENT SYMPTOM: "Have you had leg swelling before?" If so, ask: "When was the last time?" "What happened that time?"     Yes legs have been swollen before 10. OTHER SYMPTOMS: "Do you have any other symptoms?" (e.g., chest pain, difficulty breathing)       No  Protocols used: LEG SWELLING AND EDEMA-A-AH

## 2017-04-06 NOTE — Assessment & Plan Note (Signed)
Acute worsening on chronic very mild leg edema No obvious cause, no signs or symptoms of CHF CKD stable.  Not compliant with a low sodium diet Not elevating legs  Left leg likely worse due to acute knee injury, but both legs are swollen and worse Elevated legs when sitting More compliance with a low sodium diet Compression socks Call if no improvement

## 2017-04-08 DIAGNOSIS — J3081 Allergic rhinitis due to animal (cat) (dog) hair and dander: Secondary | ICD-10-CM | POA: Diagnosis not present

## 2017-04-08 DIAGNOSIS — J3089 Other allergic rhinitis: Secondary | ICD-10-CM | POA: Diagnosis not present

## 2017-04-08 DIAGNOSIS — J301 Allergic rhinitis due to pollen: Secondary | ICD-10-CM | POA: Diagnosis not present

## 2017-04-10 DIAGNOSIS — R2689 Other abnormalities of gait and mobility: Secondary | ICD-10-CM | POA: Diagnosis not present

## 2017-04-10 DIAGNOSIS — M25562 Pain in left knee: Secondary | ICD-10-CM | POA: Diagnosis not present

## 2017-04-10 DIAGNOSIS — M6281 Muscle weakness (generalized): Secondary | ICD-10-CM | POA: Diagnosis not present

## 2017-04-15 DIAGNOSIS — M25562 Pain in left knee: Secondary | ICD-10-CM | POA: Diagnosis not present

## 2017-04-15 DIAGNOSIS — M6281 Muscle weakness (generalized): Secondary | ICD-10-CM | POA: Diagnosis not present

## 2017-04-15 DIAGNOSIS — R2689 Other abnormalities of gait and mobility: Secondary | ICD-10-CM | POA: Diagnosis not present

## 2017-04-17 ENCOUNTER — Other Ambulatory Visit: Payer: Self-pay | Admitting: Internal Medicine

## 2017-04-17 DIAGNOSIS — M25562 Pain in left knee: Secondary | ICD-10-CM | POA: Diagnosis not present

## 2017-04-17 DIAGNOSIS — M6281 Muscle weakness (generalized): Secondary | ICD-10-CM | POA: Diagnosis not present

## 2017-04-17 DIAGNOSIS — R2689 Other abnormalities of gait and mobility: Secondary | ICD-10-CM | POA: Diagnosis not present

## 2017-04-18 DIAGNOSIS — R2689 Other abnormalities of gait and mobility: Secondary | ICD-10-CM | POA: Diagnosis not present

## 2017-04-18 DIAGNOSIS — M25562 Pain in left knee: Secondary | ICD-10-CM | POA: Diagnosis not present

## 2017-04-18 DIAGNOSIS — M6281 Muscle weakness (generalized): Secondary | ICD-10-CM | POA: Diagnosis not present

## 2017-04-22 DIAGNOSIS — M25562 Pain in left knee: Secondary | ICD-10-CM | POA: Diagnosis not present

## 2017-04-22 DIAGNOSIS — M6281 Muscle weakness (generalized): Secondary | ICD-10-CM | POA: Diagnosis not present

## 2017-04-22 DIAGNOSIS — R2689 Other abnormalities of gait and mobility: Secondary | ICD-10-CM | POA: Diagnosis not present

## 2017-04-23 DIAGNOSIS — R2689 Other abnormalities of gait and mobility: Secondary | ICD-10-CM | POA: Diagnosis not present

## 2017-04-23 DIAGNOSIS — M6281 Muscle weakness (generalized): Secondary | ICD-10-CM | POA: Diagnosis not present

## 2017-04-23 DIAGNOSIS — M25562 Pain in left knee: Secondary | ICD-10-CM | POA: Diagnosis not present

## 2017-04-25 DIAGNOSIS — M25562 Pain in left knee: Secondary | ICD-10-CM | POA: Diagnosis not present

## 2017-04-25 DIAGNOSIS — M6281 Muscle weakness (generalized): Secondary | ICD-10-CM | POA: Diagnosis not present

## 2017-04-25 DIAGNOSIS — R2689 Other abnormalities of gait and mobility: Secondary | ICD-10-CM | POA: Diagnosis not present

## 2017-04-26 DIAGNOSIS — J3089 Other allergic rhinitis: Secondary | ICD-10-CM | POA: Diagnosis not present

## 2017-04-26 DIAGNOSIS — J301 Allergic rhinitis due to pollen: Secondary | ICD-10-CM | POA: Diagnosis not present

## 2017-04-26 DIAGNOSIS — J3081 Allergic rhinitis due to animal (cat) (dog) hair and dander: Secondary | ICD-10-CM | POA: Diagnosis not present

## 2017-04-29 DIAGNOSIS — M6281 Muscle weakness (generalized): Secondary | ICD-10-CM | POA: Diagnosis not present

## 2017-04-29 DIAGNOSIS — M25562 Pain in left knee: Secondary | ICD-10-CM | POA: Diagnosis not present

## 2017-04-29 DIAGNOSIS — R2689 Other abnormalities of gait and mobility: Secondary | ICD-10-CM | POA: Diagnosis not present

## 2017-04-30 DIAGNOSIS — M6281 Muscle weakness (generalized): Secondary | ICD-10-CM | POA: Diagnosis not present

## 2017-04-30 DIAGNOSIS — M25562 Pain in left knee: Secondary | ICD-10-CM | POA: Diagnosis not present

## 2017-04-30 DIAGNOSIS — R2689 Other abnormalities of gait and mobility: Secondary | ICD-10-CM | POA: Diagnosis not present

## 2017-05-03 DIAGNOSIS — J301 Allergic rhinitis due to pollen: Secondary | ICD-10-CM | POA: Diagnosis not present

## 2017-05-03 DIAGNOSIS — J3081 Allergic rhinitis due to animal (cat) (dog) hair and dander: Secondary | ICD-10-CM | POA: Diagnosis not present

## 2017-05-03 DIAGNOSIS — J3089 Other allergic rhinitis: Secondary | ICD-10-CM | POA: Diagnosis not present

## 2017-05-06 DIAGNOSIS — M6281 Muscle weakness (generalized): Secondary | ICD-10-CM | POA: Diagnosis not present

## 2017-05-06 DIAGNOSIS — J3081 Allergic rhinitis due to animal (cat) (dog) hair and dander: Secondary | ICD-10-CM | POA: Diagnosis not present

## 2017-05-06 DIAGNOSIS — M25562 Pain in left knee: Secondary | ICD-10-CM | POA: Diagnosis not present

## 2017-05-06 DIAGNOSIS — R2689 Other abnormalities of gait and mobility: Secondary | ICD-10-CM | POA: Diagnosis not present

## 2017-05-06 DIAGNOSIS — J3089 Other allergic rhinitis: Secondary | ICD-10-CM | POA: Diagnosis not present

## 2017-05-09 ENCOUNTER — Ambulatory Visit: Payer: Medicare Other | Admitting: Neurology

## 2017-05-13 DIAGNOSIS — J3089 Other allergic rhinitis: Secondary | ICD-10-CM | POA: Diagnosis not present

## 2017-05-13 DIAGNOSIS — J301 Allergic rhinitis due to pollen: Secondary | ICD-10-CM | POA: Diagnosis not present

## 2017-05-13 DIAGNOSIS — M25562 Pain in left knee: Secondary | ICD-10-CM | POA: Diagnosis not present

## 2017-05-13 DIAGNOSIS — M6281 Muscle weakness (generalized): Secondary | ICD-10-CM | POA: Diagnosis not present

## 2017-05-13 DIAGNOSIS — R2689 Other abnormalities of gait and mobility: Secondary | ICD-10-CM | POA: Diagnosis not present

## 2017-05-13 DIAGNOSIS — J3081 Allergic rhinitis due to animal (cat) (dog) hair and dander: Secondary | ICD-10-CM | POA: Diagnosis not present

## 2017-05-15 DIAGNOSIS — J3089 Other allergic rhinitis: Secondary | ICD-10-CM | POA: Diagnosis not present

## 2017-05-15 DIAGNOSIS — J3081 Allergic rhinitis due to animal (cat) (dog) hair and dander: Secondary | ICD-10-CM | POA: Diagnosis not present

## 2017-05-16 DIAGNOSIS — J3089 Other allergic rhinitis: Secondary | ICD-10-CM | POA: Diagnosis not present

## 2017-05-16 DIAGNOSIS — J301 Allergic rhinitis due to pollen: Secondary | ICD-10-CM | POA: Diagnosis not present

## 2017-05-22 DIAGNOSIS — R2689 Other abnormalities of gait and mobility: Secondary | ICD-10-CM | POA: Diagnosis not present

## 2017-05-22 DIAGNOSIS — M25562 Pain in left knee: Secondary | ICD-10-CM | POA: Diagnosis not present

## 2017-05-22 DIAGNOSIS — M6281 Muscle weakness (generalized): Secondary | ICD-10-CM | POA: Diagnosis not present

## 2017-05-23 ENCOUNTER — Encounter: Payer: Self-pay | Admitting: Psychology

## 2017-05-23 ENCOUNTER — Ambulatory Visit (INDEPENDENT_AMBULATORY_CARE_PROVIDER_SITE_OTHER): Payer: Medicare Other | Admitting: Psychology

## 2017-05-23 ENCOUNTER — Ambulatory Visit: Payer: Medicare Other | Admitting: Psychology

## 2017-05-23 DIAGNOSIS — R413 Other amnesia: Secondary | ICD-10-CM | POA: Diagnosis not present

## 2017-05-23 DIAGNOSIS — G20C Parkinsonism, unspecified: Secondary | ICD-10-CM

## 2017-05-23 DIAGNOSIS — G20 Parkinson's disease: Secondary | ICD-10-CM

## 2017-05-23 NOTE — Progress Notes (Signed)
NEUROBEHAVIORAL STATUS EXAM   Name: AIMAR BORGHI Date of Birth: 1940-09-04 Date of Interview: 05/23/2017  Reason for Referral:  Henry Adams is a 77 y.o. male who is referred for neuropsychological evaluation by Dr. Wells Guiles Tat of [facility] due to concerns about memory change. This patient is accompanied in the office by his daughter, [name], who supplements the history.  History of Presenting Problem:  Mr. Avey has a history of parkinsonism, not meeting criteria for PD, followed by Dr. Carles Collet and treated with carbidopa-levodopa. He has endorsed cognitive concerns but he is also under a lot of stress as the primary caregiver for his wife with Alzheimer's dementia and related behavioral disturbance. MoCA on 02/04/2017 was 20/30. Brain MRI on 04/24/2017 reportedly revealed chronic microvascular ischemic changes but nothing acute.  At today's visit (05/23/2017), the patient denies any significant cognitive concerns or decline. He endorses some word finding difficulty only. He appears somewhat confused about the purpose of today's visit although we reviewed it several times. He and his daughter reported that he was having much more difficulty functioning and performing cognitively involved tasks when he was sleep deprived. Sleep has since improved (it appears he was started on trazodone, although it remains unclear if he is taking the medication or not), and with the improvement in sleep there has been improvement in functioning. His daughter does still notice slowed processing speed. It takes them longer to have a conversation and "sometimes it seems we are speaking a different language". The patient does not noticed slowed processing speed. 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 He reports this was related to lack of initiation/energy and depression rather than cognitive problems. He did give  up this role with his foundation and in fact is shedding all responsibilities associated with the foundation very soon.   The patient lives with [place] in [place] [facility]. They have lived there a little over a year. His wife started showing signs of dementia 8 years ago. Her behaviors have been particularly difficult in the past 2 years. He has been her 24/7 caregiver for the past year or so. His daughters do help out and provide some respite but they do not have any hired caregivers. He has not attended any support groups or seen a caregiver counselor, but he has met with the Alzheimer's [employer] representative and gone to some classes at [place]. He was experiencing significant depression related to caregiver stress but this has improved since his sleep has improved. His daughter notes "his humor came back". He takes Lexapro. He has no prior history of depression or any other psychiatric history. He denied past or present suicidal ideation/intention. His appetite is good. He does not drink any alcohol.   He has had issues with his knee giving out and falling as a result. Most recently, he was in the ED on 03/16/2017 after his leg(s) "collapsed on me". He had significant knee pain. He is now in PT and wearing a brace, which is helping. He also has pain related to neuropathy but this is sporadic in nature.    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 He started the [employer] in 2008. 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 Marital  history: Married with 2 daughters Alcohol: None Tobacco: None, never smoker   Medical History: Past Medical History:  Diagnosis Date  . Allergy    perennial  . CKD (chronic kidney disease)   . GERD (gastroesophageal reflux disease)   . HLD  (hyperlipidemia)   . Hx of cardiovascular stress test    ETT-Myoview 6/14: probable low risk study, inf defect with normal inf wall motion-cannot r/o ischemia; prominent gut uptake adjacent to inf wall on rest images-poss defect is atten, EF 63%  . Hypertension   . Polycystic kidney disease       Current Medications:  Outpatient Encounter Medications as of 05/23/2017  Medication Sig  . aspirin 81 MG tablet Take 81 mg by mouth daily.    [name] azelastine (ASTELIN) 137 MCG/SPRAY nasal spray Place 1 spray into both nostrils 2 (two) times daily. Use in each nostril as directed  . carbidopa-levodopa (SINEMET IR) 25-100 MG tablet Take 1 tablet by mouth 3 (three) times daily.  . Carbidopa-Levodopa ER (SINEMET CR) 25-100 MG tablet controlled release Take 1 tablet by mouth 2 (two) times daily.  [name] escitalopram (LEXAPRO) 20 MG tablet Take 1 tablet (20 mg total) daily by mouth.  . fexofenadine (ALLEGRA) 180 MG tablet Take 180 mg by mouth daily.    . hydrochlorothiazide (MICROZIDE) 12.5 MG capsule   . Misc. Devices (WALKER) MISC 1 each by Does not apply route daily.  [name] omeprazole (PRILOSEC) 40 MG capsule Take 1 capsule (40 mg total) by mouth daily.  . rosuvastatin (CRESTOR) 5 MG tablet Take 1 tablet (5 mg total) by mouth daily.  [name] telmisartan (MICARDIS) 40 MG tablet Take 20 mg by mouth daily.   . traZODone (DESYREL) 50 MG tablet TAKE 1 OR 2 TABLETS AT BEDTIME AS NEEDED FOR SLEEP.  [name] vitamin B-12 (CYANOCOBALAMIN) 1000 MCG tablet Take 1,000 mcg by mouth daily.  . vitamin C (ASCORBIC ACID) 500 MG tablet Take 1,000 mg by mouth daily.   [name] zolpidem (AMBIEN) 5 MG tablet Take 1 tablet (5 mg total) by mouth at bedtime as needed for sleep.   No facility-administered encounter medications on file as of 05/23/2017.    Patient is a little unclear about whether he is taking Trazodone or Ambien (or both?). He and his daughter will check his medications at home.  Behavioral Observations:   Appearance: Neatly and  appropriately dressed and groomed Gait: Ambulated independently with reduced arm swing on the right and slowed gait  Speech: Fluent; reduced rate, reduced volume. Mild word finding difficulty. Increased response latencies. Thought process: Generally linear Affect: Flat/masked Interpersonal: Pleasant, appropriate   50 minutes spent face-to-face with patient completing neurobehavioral status exam. 45 minutes spent integrating medical records/clinical data and completing this report. [id number] unit; [id number] unit.   TESTING: There is medical necessity to proceed with neuropsychological assessment as the results will be used to aid in differential diagnosis and clinical decision-making and to inform specific treatment recommendations. Per the patient, his daughter and medical records reviewed, there has been a change in cognitive functioning and a reasonable suspicion of dementia (rule out pseudodementia due to caregiver stress and depression; rule out MCI related to parkinsonism; rule out memory disorder).  Clinical Decision Making: In considering the patient's current level of functioning, level of presumed impairment, nature of symptoms, emotional and behavioral responses during the interview, level of literacy, and observed level of motivation, a battery of tests was selected and communicated to the psychometrician.   Following the clinical interview/neurobehavioral status exam,  the patient completed this full battery of neuropsychological testing with my psychometrician under my supervision (see separate note).   PLAN: The patient will return to see me for a follow-up session at which time his test performances and my impressions and treatment recommendations will be reviewed in detail.  Evaluation ongoing; full report to follow.

## 2017-05-23 NOTE — Progress Notes (Signed)
   Neuropsychology Note  [name] completed 60 minutes of neuropsychological testing with technician, Milana Kidney, BS, under the supervision of Dr. Macarthur Critchley, Licensed Psychologist. The patient did not appear overtly distressed by the testing session, per behavioral observation or via self-report to the technician. Rest breaks were offered.   Clinical Decision Making: In considering the patient's current level of functioning, level of presumed impairment, nature of symptoms, emotional and behavioral responses during the interview, level of literacy, and observed level of motivation/effort, a battery of tests was selected and communicated to the psychometrician.  Communication between the psychologist and technician was ongoing throughout the testing session and changes were made as deemed necessary based on patient performance on testing, technician observations and additional pertinent factors such as those listed above.  Henry Adams will return within approximately 2 weeks for an interactive feedback session with Dr. Si Raider at which time his test performances, clinical impressions and treatment recommendations will be reviewed in detail. The patient understands he can contact our office should he require our assistance before this time.  35 minutes spent performing neuropsychological evaluation services/clinical decision making (psychologist). [CPT 11155] 60 minutes spent face-to-face with patient administering standardized tests, 30 minutes spent scoring (technician). [CPT [id number], 20802]  Full report to follow.

## 2017-05-29 DIAGNOSIS — J3081 Allergic rhinitis due to animal (cat) (dog) hair and dander: Secondary | ICD-10-CM | POA: Diagnosis not present

## 2017-05-29 DIAGNOSIS — J301 Allergic rhinitis due to pollen: Secondary | ICD-10-CM | POA: Diagnosis not present

## 2017-05-29 DIAGNOSIS — J3089 Other allergic rhinitis: Secondary | ICD-10-CM | POA: Diagnosis not present

## 2017-06-07 DIAGNOSIS — J3081 Allergic rhinitis due to animal (cat) (dog) hair and dander: Secondary | ICD-10-CM | POA: Diagnosis not present

## 2017-06-07 DIAGNOSIS — J301 Allergic rhinitis due to pollen: Secondary | ICD-10-CM | POA: Diagnosis not present

## 2017-06-07 DIAGNOSIS — J3089 Other allergic rhinitis: Secondary | ICD-10-CM | POA: Diagnosis not present

## 2017-06-13 NOTE — Progress Notes (Signed)
NEUROPSYCHOLOGICAL EVALUATION   Name:    Henry Adams  Date of Birth:   12/09/1940 Date of Interview:  05/23/2017 Date of Testing:  05/23/2017   Date of Feedback:  06/18/2017       Background Information:  Reason for Referral:  Henry Adams is a 77 y.o. male referred by Dr. Wells Guiles Tat to assess his current level of cognitive functioning and assist in differential diagnosis. The current evaluation consisted of a review of available medical records, an interview with the patient and his daughter, Henry Adams, and the completion of a neuropsychological testing battery. Informed consent was obtained.  History of Presenting Problem:  Henry Adams has a history of parkinsonism, not meeting criteria for PD, followed by Dr. Carles Collet and treated with carbidopa-levodopa. He has endorsed cognitive concerns but he is also under a lot of stress as the primary caregiver for his wife with Alzheimer's dementia and related behavioral disturbance. MoCA on 02/04/2017 was 20/30. Brain MRI on 04/24/2017 reportedly revealed chronic microvascular ischemic changes but nothing acute.  At today's visit (05/23/2017), the patient denies any significant cognitive concerns or decline. He endorses some word finding difficulty only. He appears somewhat confused about the purpose of today's visit although we reviewed it several times. He and his daughter reported that he was having much more difficulty functioning and performing cognitively involved tasks when he was sleep deprived. Sleep has since improved (it appears he was started on trazodone, although it remains unclear if he is taking the medication or not), and with the improvement in sleep there has been improvement in functioning. His daughter does still notice slowed processing speed. It takes them longer to have a conversation and "sometimes it seems we are speaking a different language". The patient does not noticed slowed processing speed. He has made comments to her in the past that  he felt he could no longer do some of the activities for his [employer] (e.g., accounting, bookkeeping). He reports this was related to lack of initiation/energy and depression rather than cognitive problems. 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   The patient lives with his [place] in [place] [facility]. They have lived there a little over a year. His wife started showing signs of dementia 8 years ago. Her behaviors have been particularly difficult in the past 2 years. He has been her 24/7 caregiver for the past year or so. His daughters do help out and provide some respite but they do not have any hired caregivers. He has not attended any support groups or seen a caregiver counselor, but he has met with the [employer] representative and gone to some classes at [place]. He was experiencing significant depression related to caregiver stress but this has improved since his sleep has improved. His daughter notes "his humor came back". He takes Lexapro. He has no prior history of depression or any other psychiatric history. He denied past or present suicidal ideation/intention. His appetite is good. He does not drink any alcohol.   He has had issues with his knee giving out and falling as a result. Most recently, he was in the ED on 03/16/2017 after his leg(s) "collapsed on me". He had significant knee pain. He is now in PT and wearing a brace, which is helping. He also has pain related to neuropathy but this is sporadic in nature.    Social History: Education: BS in  Engineering Occupational history: Retired from [employer] after 25-30 years, later worked in [employer] (in [employer]). He started the [employer] in 2008. 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 Marital history: Married with 2  daughters Alcohol: None Tobacco: None, never smoker   Medical History:  Past Medical History:  Diagnosis Date  . Allergy    perennial  . CKD (chronic kidney disease)   . GERD (gastroesophageal reflux disease)   . HLD (hyperlipidemia)   . Hx of cardiovascular stress test    ETT-Myoview 6/14: probable low risk study, inf defect with normal inf wall motion-cannot r/o ischemia; prominent gut uptake adjacent to inf wall on rest images-poss defect is atten, EF 63%  . Hypertension   . Polycystic kidney disease     Current medications:  Outpatient Encounter Medications as of 06/18/2017  Medication Sig  . aspirin 81 MG tablet Take 81 mg by mouth daily.    [name] azelastine (ASTELIN) 137 MCG/SPRAY nasal spray Place 1 spray into both nostrils 2 (two) times daily. Use in each nostril as directed  . carbidopa-levodopa (SINEMET IR) 25-100 MG tablet Take 1 tablet by mouth 3 (three) times daily.  . Carbidopa-Levodopa ER (SINEMET CR) 25-100 MG tablet controlled release Take 1 tablet by mouth 2 (two) times daily.  [name] escitalopram (LEXAPRO) 20 MG tablet Take 1 tablet (20 mg total) daily by mouth.  . fexofenadine (ALLEGRA) 180 MG tablet Take 180 mg by mouth daily.    . hydrochlorothiazide (MICROZIDE) 12.5 MG capsule   . Misc. Devices (WALKER) MISC 1 each by Does not apply route daily.  [name] omeprazole (PRILOSEC) 40 MG capsule Take 1 capsule (40 mg total) by mouth daily.  . rosuvastatin (CRESTOR) 5 MG tablet Take 1 tablet (5 mg total) by mouth daily.  [name] telmisartan (MICARDIS) 40 MG tablet Take 20 mg by mouth daily.   . traZODone (DESYREL) 50 MG tablet TAKE 1 OR 2 TABLETS AT BEDTIME AS NEEDED FOR SLEEP.  [name] vitamin B-12 (CYANOCOBALAMIN) 1000 MCG tablet Take 1,000 mcg by mouth daily.  . vitamin C (ASCORBIC ACID) 500 MG tablet Take 1,000 mg by mouth daily.   [name] zolpidem (AMBIEN) 5 MG tablet Take 1 tablet (5 mg total) by mouth at bedtime as needed for sleep.   No facility-administered encounter medications on file  as of 06/18/2017.      Current Examination:  Behavioral Observations:   Appearance: Neatly and appropriately dressed and groomed Gait: Ambulated independently with reduced arm swing on the right and slowed gait  Speech: Fluent; reduced rate, reduced volume. Mild word finding difficulty. Increased response latencies. Thought process: Generally linear Affect: Flat/masked Interpersonal: Pleasant, appropriate Orientation: Oriented to all spheres. Accurately named the current President and his predecessor.   Tests Administered: . Test of Premorbid Functioning (TOPF) . Wechsler Adult Intelligence Scale-Fourth Edition (WAIS-IV): Similarities, Music therapist, Coding and Digit Span subtests . Wechsler Memory Scale-Fourth Edition (WMS-IV) Older Adult Version (ages 72-90): Logical Memory I, II and Recognition subtests  . [employer] Verbal Learning Test - 2nd Edition (CVLT-2) Short Form . Repeatable Battery for the Assessment of Neuropsychological Status (RBANS) Form A:  Figure Copy and Recall subtests and Semantic Fluency subtest . Boston Naming Test (BNT): Items 1-30 only (*administration error) . Boston Diagnostic Aphasia Examination: Complex Ideational Material subtest . Controlled Oral Word Association Test (COWAT) . Trail Making Test A and B . Clock drawing test . Beck Depression Inventory - 2nd Edition (  BDI-II) . Generalized Anxiety Disorder - 7 item screener (GAD-7)  Test Results: Note: Standardized scores are presented only for use by appropriately trained professionals and to allow for any future test-retest comparison. These scores should not be interpreted without consideration of all the information that is contained in the rest of the report. The most recent standardization samples from the test publisher or other sources were used whenever possible to derive standard scores; scores were corrected for age, gender, ethnicity and education when available.   Test Scores:  Test Name Raw  Score Standardized Score Descriptor  TOPF 46/70 SS= 104 Average  WAIS-IV Subtests     Similarities 18/36 ss= 8 Average  Block Design 24/66 ss= 9 Average   Coding 46/135 ss= 10 Average  Digit Span Forward 6/16 ss= 6 Low average  Digit Span Backward 8/16 ss= 10 Average  WMS-IV Subtests     LM I 15/53 ss= 5 Borderline  LM II 8/39 ss= 7 Low average  LM II Recognition 19/23 Cum %: 51-75 WNL  RBANS Subtests     Figure Copy 19/20 Z= 0.7 High average  Figure Recall 14/20 Z= 0.4 Average  Semantic Fluency 12 Z= -1.5 Borderline  CVLT-II Scores     Trial 1 2/9 Z= -3 Severely impaired  Trial 4 6/9 Z= -1 Low average  Trials 1-4 total 16/36 T= 29 Impaired  SD Free Recall 4/9 Z= -1.5 Borderline  LD Free Recall 4/9 Z= -0.5 Average  LD Cued Recall 4/9 Z= -0.5 Average  Recognition Discriminability 9/9 hits, 0 false positives Z= 1 High average  Forced Choice Recognition 9/9  WNL  BNT 20/30*    BDAE Complex Ideational Material 11/12  WNL  COWAT-FAS 13 T= 26 Impaired  COWAT-Animals 10 T= 30 Impaired  Trail Making Test A  59" 0 errors T= 43 Average  Trail Making Test B  193" 1 error T= 35 Borderline  Clock Drawing   WNL  BDI-II 10/63  WNL  GAD-7 6/21  Mild      Description of Test Results:  Premorbid verbal intellectual abilities were estimated to have been within the average range based on a test of word reading. Psychomotor processing speed was average. Auditory attention and working memory were low average to average; of note, he performed better on the more complex digit span backward test than digit span forward, suggesting possible variable attention. Visual-spatial construction was average to high average. Language abilities were variable. Specifically, semantic verbal fluency was borderline impaired to impaired, while auditory comprehension of complex ideational material was normal. He was only administered half of the confrontation naming task, due to test administration error, but  performance on this half was below expectation for age and educational level. With regard to verbal memory, encoding and acquisition of non-contextual information (i.e., word list) was impaired. After a brief distracter task, free recall was borderline (4/9 items). After a delay, free recall was average (4/9 items). He did not benefit from semantic cueing (cued recall = 4/9 items, average). Performance on a yes/no recognition task was high average with 100% accuracy. On another verbal memory test, encoding and acquisition of contextual auditory information (i.e., short stories) was borderline. After a delay, free recall was lowa verage. Performance on a yes/no recognition task was normal. With regard to non-verbal memory, delayed free recall of visual information was average. Executive functioning was variable. Mental flexibility and set-shifting were borderline on Trails B. Verbal fluency with phonemic search restrictions was impaired. Verbal abstract reasoning was average.  Performance on a clock drawing task was normal. On a self-report measure of mood, the patient's responses were not indicative of clinically significant depression at the present time. However he did endorse some depressive symptoms, including: sadness much of the time, pessimism, anhedonia, restlessness, loss of interest, reduced energy, increased sleep, concentration difficulty and fatigue. He denied suicidal ideation or intention. On a self-report measure of anxiety, the patient endorsed mild generalized anxiety characterized by nervousness, excessive worrying, difficulty relaxing, restlessness, irritability and fear of something awful happening.    Clinical Impressions: Adjustment disorder with depressed and anxious mood. Most test performances were within normal limits. There were a few impaired performances on testing, but behavioral observations suggested variable effort/test engagement with possible psychological interference. I  suspect his cognitive symptoms are due to significant psychosocial stress which is causing some adjustment related depression and anxiety. It is possible that he has some Mild Cognitive Impairment beyond what can be explained by stress/mood, but I do not see any evidence of dementia.     Recommendations/Plan: Based on the findings of the present evaluation, the following recommendations are offered:  1. Caregiver support is highly recommended. I suggest the family consider hired caregiver assistance and/or consider memory care for the patient's wife. He desperately needs respite. This is, understandably, very difficult emotionally for the patient and his daughter to address. I also highly recommend he attend a dementia caregiver support group and also consider seeing a counselor for individual counseling focused on caregiver support. I provided written information on local resources. His family is arranging for him to see a counselor through [facility]. We spoke at length about the recommendation for hired caregiver assistance and caregiver respite. 2. The patient should have a regular schedule of activities that allow him to get safe cardiovascular exercise, mental stimulation and social interaction.  3. Neuropsychological re-evaluation in a year is recommended in order to monitor cognitive status, track any progression of cognitive decline, and further assist with treatment planning. 4. Mr. Sabic will follow up with Dr. Carles Collet on 07/01/2017. He noted several questions about his parkinsonism that he would like to address with Dr. Carles Collet at that time. He is wanting to know if his symptoms will be ongoing or if they could be resolved with medication eventually. He is also wanting to know if his symptoms will progress over time. I encouraged him to write down any specific questions he has and take them to the appointment with him.    Feedback to Patient: Henry Adams and his daughter returned for a  feedback appointment on 06/18/2017 to review the results of his neuropsychological evaluation with this provider. 40 minutes face-to-face time was spent reviewing his test results, my impressions and my recommendations as detailed above.    Total time spent on this patient's case: 95 minutes for neurobehavioral status exam with psychologist (CPT code [phone], [phone]); 90 minutes of testing/scoring by psychometrician under psychologist's supervision (CPT codes [phone], [phone] units); 180 minutes for integration of patient data, interpretation of standardized test results and clinical data, clinical decision making, treatment planning and preparation of this report, and interactive feedback with review of results to the patient/family by psychologist (CPT codes [phone], [phone] units).      Thank you for your referral of Henry Adams. Please feel free to contact me if you have any questions or concerns regarding this report.

## 2017-06-14 DIAGNOSIS — J3081 Allergic rhinitis due to animal (cat) (dog) hair and dander: Secondary | ICD-10-CM | POA: Diagnosis not present

## 2017-06-14 DIAGNOSIS — J3089 Other allergic rhinitis: Secondary | ICD-10-CM | POA: Diagnosis not present

## 2017-06-14 DIAGNOSIS — J301 Allergic rhinitis due to pollen: Secondary | ICD-10-CM | POA: Diagnosis not present

## 2017-06-18 ENCOUNTER — Encounter: Payer: Self-pay | Admitting: Psychology

## 2017-06-18 ENCOUNTER — Ambulatory Visit: Payer: Medicare Other | Admitting: Psychology

## 2017-06-18 DIAGNOSIS — G20 Parkinson's disease: Secondary | ICD-10-CM | POA: Diagnosis not present

## 2017-06-18 DIAGNOSIS — F4323 Adjustment disorder with mixed anxiety and depressed mood: Secondary | ICD-10-CM

## 2017-06-18 NOTE — Patient Instructions (Addendum)
Based on the current evaluation, I suspect that your cognitive symptoms are due to significant psychosocial stress which is causing some adjustment-related depression and anxiety. I do not see any evidence of dementia.  Recommendations/Plan: Based on the findings of the present evaluation, the following recommendations are offered:  1. Caregiver support is highly recommended. I suggest the family consider hired caregiver assistance and/or consider memory care for the patient's wife. He desperately needs respite. This is, understandably, very difficult emotionally for the patient and his daughter to address. I also highly recommend he attend a dementia caregiver support group and also consider seeing a counselor for individual counseling focused on caregiver support. I provided written information on local resources. 2. The patient should have a regular schedule of activities that allow him to get safe cardiovascular exercise, mental stimulation and social interaction.  3. Neuropsychological re-evaluation in a year is recommended in order to monitor cognitive status, track any progression of cognitive decline, and further assist with treatment planning.

## 2017-06-21 ENCOUNTER — Other Ambulatory Visit: Payer: Self-pay | Admitting: Internal Medicine

## 2017-06-21 DIAGNOSIS — K219 Gastro-esophageal reflux disease without esophagitis: Secondary | ICD-10-CM

## 2017-06-21 DIAGNOSIS — A048 Other specified bacterial intestinal infections: Secondary | ICD-10-CM

## 2017-06-27 ENCOUNTER — Encounter

## 2017-06-27 ENCOUNTER — Ambulatory Visit: Payer: Medicare Other | Admitting: Neurology

## 2017-06-27 NOTE — Progress Notes (Signed)
Henry Adams was seen today in the movement disorders clinic for neurologic consultation at the request of Henry Lima, MD.    Prior records made available to me were reviewed.  The patient first presented to [facility] in August, 2015 with complaints of a weak voice.    States that his voice was getting soft for a few years prior to that.   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   It helped for 4-5 months.   He recently followed up with his ENT physician at [facility] on 03/23/2015 complaining of continued weak voice, but they felt that his voice was fairly strong and did not see any pathology.  He was to follow-up with the voice lab.   The voice therapy didn't seem to help.   He did follow up with his primary care physician and was complaining about some mild tremor and was referred here to rule out Parkinson's disease.  Pt states that it is when he holds something proximally that he notes tremor bilaterally.  He also notes difficulty with handwriting; it is small and "squiggly."   02/01/17 update: Patient is seen today in follow-up, although I have not seen him in 2 years.  He is here to discuss memory change per referral but comes with a list of many other issues.  This patient is accompanied in the office by his daughter who supplements the history.    I have reviewed numerous records made available to me.  Pt reports he has been concerned about memory for about 10 months, although he complained about this concern to me about 2 years ago although there was no evidence of dementia then.  He was placed on Lexapro in October for concerns of anxiety and depression, which could potentially affect memory.  Pt thinks that was helpful but then later says that anxiety and depression are his biggest issue.  Living situation:  Pt lives with their spouse.  However, the patient's wife has Alzheimer dementia and the  patient is responsible for her care.   They live independently in retirement living.   The patient does do the finances in the home. Pt reports that he can do these but he has low initiative/motivation to do that. He isn't really sure that he has a memory problem but a motivation/energy problem.   He hasn't missed paying any bills.  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  He notes trouble with his cursive writing as well.   The patient does drive.  He has had no problem with that.  He programs his GPS himself and goes.   There have no been any motor vehicle accidents in the recent years.  The patient does not cook.  He states that they provide meals at retirement living .  He works out at [employer] at retirement living.  ADL's:  The patient is able to perform his own ADL's. The patient self medicates.  The patients bladder and bowel are under good control.   Behavior:  There have been no behavioral changes over the years.  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  Wifes personality has also changed with AD; becomes  agitated and paranoid.  Pt taking all the "brunt" of that per daughter.    03/12/17 update: Patient was seen today as a work in, at his request.  This patient is accompanied in the office by his daughter who supplements the history.Last visit, he was complaining about memory change and he was scheduled for neurocognitive testing.  This has not yet been completed, due to the fact that there is a lengthy wait list for this and the fact that he requested an early work in appointment.  Lab work was done for peripheral neuropathy as well as for myasthenia, and all of this was unremarkable.  We did start him on levodopa last visit and was to work up to carbidopa/levodopa 25/100 over a 4 week time span.   He is taking it 8am/noon/midnight.  Stays up to take at midnight so can spread evenly q 8 hours.   "I have made some progress and there are some days that I sleep all night."  Biggest c/o is restlessness and leg pain at night.  He has trouble in the AM for about 2-3 hours.  The trouble he describes are aches.  "so much of the trouble centers around neuropathy."  Daughter states that big contributor is they have had some bad times in taking care of wife and note that stress increases physical symptoms.  On lexapro 20 mg - been on that for about 2 months per patient.  He isn't sure that it is helping.  Daughter not sure.  Would like something else  07/01/17 update: Patient is seen today for follow-up for parkinsonism.  He was told to take carbidopa/levodopa 25/100 at 7 AM/11 AM/3 PM/7 PM and added carbidopa/levodopa 25/100 CR at bedtime to see if that would help restless leg syndrome.   He has had no falls but he did have his knee "give out" and had a near fall and went to PT and is better.  "I'm doing so much better."   The records that were made available to me were reviewed.  he saw Dr. Si Raider for neuropscyhometric testing on 05/23/17 and subsequently had a feedback session with him where results and recommendations were given to him.  These are detailed within the chart.  Pt did not have any evidence of dementia.  Pt dx with adjustment d/o with depressed and anxious mood.   Recommended respite care for him.  He was on lexapro and last visit we discussed remeron but told him to f/u with PCP.  He did see Dr. Sharlet Salina and he was told that he should f/u with Henry Lima, MD as the medication wouldn't help his "leg wandering sensation.  Ultimately, he was given a RX for trazdone by Dr. Sharlet Salina.  He states that it is helping.  Once per month, he may have an "irritable day" but otherwise doing well.    This has helped the RLS.  Still caregiving for wife who has AD.  He states that "she is living in a different  world."  Daughters are helping some but unsure if able to do on the longterm.  He is on lexapro and not sure if working as depression not as bad as it was but has it.     PREVIOUS MEDICATIONS: given RX for aricept on 01/09/17 but pt reported vivid scary dreams with it after 3 days and he d/c it.  He started on namenda    ALLERGIES:   Allergies  Allergen Reactions  . Iodine  rash  . Diazepam     Mental status changes   . Oxycodone     Mental status changes & nausea  . Pollen Extract Itching  . Tamsulosin     Other reaction(s): Other (See Comments) Unknown   . Doxazosin Rash    agitation  . Etodolac Rash    unknown  . Tape Rash    Certain "Band-Aids"    CURRENT MEDICATIONS:  Outpatient Encounter Medications as of 07/01/2017  Medication Sig  . aspirin 81 MG tablet Take 81 mg by mouth daily.    [name] azelastine (ASTELIN) 137 MCG/SPRAY nasal spray Place 1 spray into both nostrils 2 (two) times daily. Use in each nostril as directed  . carbidopa-levodopa (SINEMET IR) 25-100 MG tablet Take 1 tablet by mouth 3 (three) times daily.  . Carbidopa-Levodopa ER (SINEMET CR) 25-100 MG tablet controlled release Take 1 tablet by mouth 2 (two) times daily. (Patient taking differently: Take 1 tablet by mouth at bedtime. )  . escitalopram (LEXAPRO) 20 MG tablet Take 1 tablet (20 mg total) daily by mouth.  . fexofenadine (ALLEGRA) 180 MG tablet Take 180 mg by mouth daily.    . hydrochlorothiazide (MICROZIDE) 12.5 MG capsule   . omeprazole (PRILOSEC) 40 MG capsule TAKE (1) CAPSULE DAILY.  [name] telmisartan (MICARDIS) 40 MG tablet Take 20 mg by mouth daily.   . traZODone (DESYREL) 50 MG tablet TAKE 1 OR 2 TABLETS AT BEDTIME AS NEEDED FOR SLEEP.  [name] buPROPion (WELLBUTRIN XL) 150 MG 24 hr tablet Take 1 tablet (150 mg total) by mouth daily.  [name] buPROPion (WELLBUTRIN XL) 300 MG 24 hr tablet Take 1 tablet (300 mg total) by mouth daily.  . [DISCONTINUED] Misc. Devices (WALKER) MISC 1 each by Does not apply  route daily.  . [DISCONTINUED] rosuvastatin (CRESTOR) 5 MG tablet Take 1 tablet (5 mg total) by mouth daily.  . [DISCONTINUED] vitamin B-12 (CYANOCOBALAMIN) 1000 MCG tablet Take 1,000 mcg by mouth daily.  . [DISCONTINUED] vitamin C (ASCORBIC ACID) 500 MG tablet Take 1,000 mg by mouth daily.   . [DISCONTINUED] zolpidem (AMBIEN) 5 MG tablet Take 1 tablet (5 mg total) by mouth at bedtime as needed for sleep.   No facility-administered encounter medications on file as of 07/01/2017.     PAST MEDICAL HISTORY:   Past Medical History:  Diagnosis Date  . Allergy    perennial  . CKD (chronic kidney disease)   . GERD (gastroesophageal reflux disease)   . HLD (hyperlipidemia)   . Hx of cardiovascular stress test    ETT-Myoview 6/14: probable low risk study, inf defect with normal inf wall motion-cannot r/o ischemia; prominent gut uptake adjacent to inf wall on rest images-poss defect is atten, EF 63%  . Hypertension   . Polycystic kidney disease     PAST SURGICAL HISTORY:   Past Surgical History:  Procedure Laterality Date  . COLONOSCOPY  2011   diverticulosis  . HERNIA REPAIR     umbilical  . KNEE SURGERY Left   . medialization laryngoplasty  10/15/2013   Dr Joya Gaskins, [facility]  . TONSILLECTOMY AND ADENOIDECTOMY      SOCIAL HISTORY:   Social History   Socioeconomic History  . Marital status: Married    Spouse name: Not on file  . Number of children: Not on file  . Years of education: Not on file  . Highest education level: Not on file  Occupational History  . 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 Financial resource strain: Not on file  . Food insecurity:    Worry: Not on file    Inability: Not on file  . Transportation needs:    Medical: Not on file    Non-medical: Not on file  Tobacco Use  . Smoking status: Never Smoker  . Smokeless tobacco: Never Used  Substance and Sexual Activity  . Alcohol use: Yes    Alcohol/week: 0.0  oz    Comment:  2-3 beers / month  . Drug use: No  . Sexual activity: Not on file  Lifestyle  . Physical activity:    Days per week: Not on file    Minutes per session: Not on file  . Stress: Not on file  Relationships  . Social connections:    Talks on phone: Not on file    Gets together: Not on file    Attends religious service: Not on file    Active member of club or organization: Not on file    Attends meetings of clubs or organizations: Not on file    Relationship status: Not on file  . Intimate partner violence:    Fear of current or ex partner: Not on file    Emotionally abused: Not on file    Physically abused: Not on file    Forced sexual activity: Not on file  Other Topics Concern  . Not on file  Social History Narrative  . Not on file    FAMILY HISTORY:   Family Status  Relation Name Status  . Mother  Deceased       heart disease, dementia  . Father  Deceased       HTN, ulcers  . Sister  Deceased       breast cancer  . Brother  Deceased       cancer tongue/cheek, heart disease  . Sister  Deceased       stroke, GERD  . Brother  Alive       DJD  . Child  Alive       2 daughters, breast CA hx  . Sister  (Not Specified)  . Brother  (Not Specified)  . Daughter  (Not Specified)  . Sister  (Not Specified)  . Brother  (Not Specified)    ROS:   A complete 10 system review of systems was obtained and was unremarkable apart from what is mentioned above.  PHYSICAL EXAMINATION:    VITALS:   Vitals:   07/01/17 0835  BP: 110/62  Pulse: 74  SpO2: 95%  Weight: 164 lb (74.4 kg)  Height: 5\' 11"  (1.803 m)     GEN:  The patient appears stated age and is in NAD. HEENT:  Normocephalic, atraumatic.  The mucous membranes are moist. The superficial temporal arteries are without ropiness or tenderness.  There are no tongue fasciculations. CV:  RRR Lungs:  CTAB Neck/HEME:  There are no carotid bruits bilaterally.  Neurological examination:  Orientation:    Montreal Cognitive Assessment  02/04/2017  Visuospatial/ Executive (0/5) 3  Naming (0/3) 1  Attention: Read list of digits (0/2) 2  Attention: Read list of letters (0/1) 1  Attention: Serial 7 subtraction starting at 100 (0/3) 1  Language: Repeat phrase (0/2) 2  Language : Fluency (0/1) 0  Abstraction (0/2) 2  Delayed Recall (0/5) 2  Orientation (0/6) 6  Total 20  Adjusted Score (based on education) 20    Orientation: The patient is alert and oriented x3. Cranial  nerves: There is good facial symmetry.  There is facial hypomimia.  The speech is fluent and clear. Soft palate rises symmetrically and there is no tongue deviation. Hearing is intact to conversational tone. Sensation: Sensation is intact to light touch throughout Motor: Strength is 5/5 in the bilateral upper and lower extremities.   Shoulder shrug is equal and symmetric.  There is no pronator drift.  Movement examination: Tone: There is mild increased tone in the bilateral upper extremities. Abnormal movements: None Coordination:  There is decremation with RAM's, only with heel and toe taps on the left. Gait and Station: The patient has no difficulty arising out of a deep-seated chair without the use of the hands. The patient's stride length is decreased with decreased arm swing.  The patient has a negative pull test.      Lab Results  Component Value Date   TSH 1.61 11/14/2016   Lab Results  Component Value Date   VITAMINB12 361 11/14/2016      Chemistry      Component Value Date/Time   NA 136 03/18/2017 1636   K 4.0 03/18/2017 1636   CL 99 03/18/2017 1636   CO2 31 03/18/2017 1636   BUN 27 (H) 03/18/2017 1636   CREATININE 1.63 (H) 03/18/2017 1636      Component Value Date/Time   CALCIUM 9.2 03/18/2017 1636   ALKPHOS 70 03/18/2017 1636   AST 16 03/18/2017 1636   ALT 9 03/18/2017 1636   BILITOT 1.0 03/18/2017 1636          ASSESSMENT/PLAN:  1.  Memory loss  -He had neurocognitive testing in 05/2017  and no evidence of dementia.  Respite care was recommended as he is caregiver for his wife.  He reports that they have meeting with family/wifes doctors to talk about long term care for her.  2.  Peripheral neuropathy  -The patient has clinical examination evidence of a diffuse peripheral neuropathy, which certainly can affect gait and balance.  We discussed safety associated with peripheral neuropathy.  We discussed balance therapy and the importance of ambulatory assistive device for balance assistance.  3.  Parkinsonism   -He will continue carbidopa/levodopa 25/100, 1 tablet at 7 AM/11 AM/3 PM/7 PM and carbidopa/levodopa 25/100 CR at bedtime.   4.  Depression  -lexapro not helping.  Will decrease to 10 mg once per day for a month and then d/c.  Start wellbutrin xl 150 mg x 1 month and then increase to 300 mg daily.  -start wellbutrin 150 mg daily for a month and then increase to 300 mg daily.  Risks, benefits, side effects and alternative therapies were discussed.  The opportunity to ask questions was given and they were answered to the best of my ability.  The patient expressed understanding and willingness to follow the outlined treatment protocols.  5.  Follow up is anticipated in the next few months, sooner should new neurologic issues arise.  Much greater than 50% of this visit was spent in counseling and coordinating care.  Total face to face time:  25 min

## 2017-07-01 ENCOUNTER — Encounter: Payer: Self-pay | Admitting: Neurology

## 2017-07-01 ENCOUNTER — Ambulatory Visit: Payer: Medicare Other | Admitting: Neurology

## 2017-07-01 VITALS — BP 110/62 | HR 74 | Ht 71.0 in | Wt 164.0 lb

## 2017-07-01 DIAGNOSIS — F330 Major depressive disorder, recurrent, mild: Secondary | ICD-10-CM

## 2017-07-01 DIAGNOSIS — G20 Parkinson's disease: Secondary | ICD-10-CM

## 2017-07-01 DIAGNOSIS — G3184 Mild cognitive impairment, so stated: Secondary | ICD-10-CM | POA: Diagnosis not present

## 2017-07-01 MED ORDER — BUPROPION HCL ER (XL) 150 MG PO TB24: 150.0000 mg | ORAL_TABLET | Freq: Every day | ORAL | 0 refills | Status: DC

## 2017-07-01 MED ORDER — BUPROPION HCL ER (XL) 300 MG PO TB24: 300.0000 mg | ORAL_TABLET | Freq: Every day | ORAL | 1 refills | Status: DC

## 2017-07-01 NOTE — Patient Instructions (Addendum)
1. Decrease Lexapro to 10 mg daily for one month, then stop.  At the same time: Start Wellbutrin 150 mg XL for one month, after you stop Lexapro increase Wellbutrin to 300 mg XL one tablet daily.   Registration is OPEN!    Third Annual Parkinson's Education Symposium   To register: [email or web address]      Search:  [employer] person attending individually Questions: [place], [place] or [email or web address]

## 2017-07-05 DIAGNOSIS — J301 Allergic rhinitis due to pollen: Secondary | ICD-10-CM | POA: Diagnosis not present

## 2017-07-05 DIAGNOSIS — J3089 Other allergic rhinitis: Secondary | ICD-10-CM | POA: Diagnosis not present

## 2017-07-08 ENCOUNTER — Other Ambulatory Visit: Payer: Self-pay | Admitting: Neurology

## 2017-07-08 DIAGNOSIS — J3089 Other allergic rhinitis: Secondary | ICD-10-CM | POA: Diagnosis not present

## 2017-07-08 DIAGNOSIS — J301 Allergic rhinitis due to pollen: Secondary | ICD-10-CM | POA: Diagnosis not present

## 2017-07-08 DIAGNOSIS — J3081 Allergic rhinitis due to animal (cat) (dog) hair and dander: Secondary | ICD-10-CM | POA: Diagnosis not present

## 2017-07-12 DIAGNOSIS — J3089 Other allergic rhinitis: Secondary | ICD-10-CM | POA: Diagnosis not present

## 2017-07-12 DIAGNOSIS — J301 Allergic rhinitis due to pollen: Secondary | ICD-10-CM | POA: Diagnosis not present

## 2017-07-12 DIAGNOSIS — J3081 Allergic rhinitis due to animal (cat) (dog) hair and dander: Secondary | ICD-10-CM | POA: Diagnosis not present

## 2017-07-17 DIAGNOSIS — J3081 Allergic rhinitis due to animal (cat) (dog) hair and dander: Secondary | ICD-10-CM | POA: Diagnosis not present

## 2017-07-17 DIAGNOSIS — J301 Allergic rhinitis due to pollen: Secondary | ICD-10-CM | POA: Diagnosis not present

## 2017-07-17 DIAGNOSIS — J3089 Other allergic rhinitis: Secondary | ICD-10-CM | POA: Diagnosis not present

## 2017-07-24 DIAGNOSIS — Z85828 Personal history of other malignant neoplasm of skin: Secondary | ICD-10-CM | POA: Diagnosis not present

## 2017-07-24 DIAGNOSIS — J3081 Allergic rhinitis due to animal (cat) (dog) hair and dander: Secondary | ICD-10-CM | POA: Diagnosis not present

## 2017-07-24 DIAGNOSIS — J3089 Other allergic rhinitis: Secondary | ICD-10-CM | POA: Diagnosis not present

## 2017-07-24 DIAGNOSIS — D1801 Hemangioma of skin and subcutaneous tissue: Secondary | ICD-10-CM | POA: Diagnosis not present

## 2017-07-24 DIAGNOSIS — H61001 Unspecified perichondritis of right external ear: Secondary | ICD-10-CM | POA: Diagnosis not present

## 2017-07-24 DIAGNOSIS — J301 Allergic rhinitis due to pollen: Secondary | ICD-10-CM | POA: Diagnosis not present

## 2017-07-24 DIAGNOSIS — L821 Other seborrheic keratosis: Secondary | ICD-10-CM | POA: Diagnosis not present

## 2017-07-31 ENCOUNTER — Other Ambulatory Visit: Payer: Self-pay | Admitting: Neurology

## 2017-08-13 DIAGNOSIS — J3081 Allergic rhinitis due to animal (cat) (dog) hair and dander: Secondary | ICD-10-CM | POA: Diagnosis not present

## 2017-08-13 DIAGNOSIS — J3089 Other allergic rhinitis: Secondary | ICD-10-CM | POA: Diagnosis not present

## 2017-08-13 DIAGNOSIS — J301 Allergic rhinitis due to pollen: Secondary | ICD-10-CM | POA: Diagnosis not present

## 2017-08-16 DIAGNOSIS — H2513 Age-related nuclear cataract, bilateral: Secondary | ICD-10-CM | POA: Diagnosis not present

## 2017-08-17 ENCOUNTER — Encounter (HOSPITAL_COMMUNITY): Payer: Self-pay

## 2017-08-17 ENCOUNTER — Emergency Department (HOSPITAL_COMMUNITY): Payer: Medicare Other

## 2017-08-17 ENCOUNTER — Other Ambulatory Visit: Payer: Self-pay

## 2017-08-17 ENCOUNTER — Emergency Department (HOSPITAL_COMMUNITY)
Admission: EM | Admit: 2017-08-17 | Discharge: 2017-08-17 | Disposition: A | Discharge status: Home / Self Care | Payer: Medicare Other | Attending: Emergency Medicine | Admitting: Emergency Medicine

## 2017-08-17 DIAGNOSIS — R110 Nausea: Secondary | ICD-10-CM | POA: Diagnosis not present

## 2017-08-17 DIAGNOSIS — R42 Dizziness and giddiness: Secondary | ICD-10-CM | POA: Insufficient documentation

## 2017-08-17 DIAGNOSIS — N189 Chronic kidney disease, unspecified: Secondary | ICD-10-CM | POA: Diagnosis not present

## 2017-08-17 DIAGNOSIS — Q613 Polycystic kidney, unspecified: Secondary | ICD-10-CM | POA: Diagnosis not present

## 2017-08-17 DIAGNOSIS — I129 Hypertensive chronic kidney disease with stage 1 through stage 4 chronic kidney disease, or unspecified chronic kidney disease: Secondary | ICD-10-CM | POA: Diagnosis not present

## 2017-08-17 DIAGNOSIS — Z79899 Other long term (current) drug therapy: Secondary | ICD-10-CM | POA: Diagnosis not present

## 2017-08-17 DIAGNOSIS — R5383 Other fatigue: Secondary | ICD-10-CM | POA: Diagnosis not present

## 2017-08-17 LAB — URINALYSIS, ROUTINE W REFLEX MICROSCOPIC
Bilirubin Urine: NEGATIVE
GLUCOSE, UA: NEGATIVE mg/dL
HGB URINE DIPSTICK: NEGATIVE
KETONES UR: NEGATIVE mg/dL
Leukocytes, UA: NEGATIVE
Nitrite: NEGATIVE
PH: 7 (ref 5.0–8.0)
PROTEIN: NEGATIVE mg/dL
Specific Gravity, Urine: 1.01 (ref 1.005–1.030)

## 2017-08-17 LAB — CBC WITH DIFFERENTIAL/PLATELET
ABS IMMATURE GRANULOCYTES: 0 10*3/uL (ref 0.0–0.1)
Basophils Absolute: 0 10*3/uL (ref 0.0–0.1)
Basophils Relative: 1 %
Eosinophils Absolute: 0.2 10*3/uL (ref 0.0–0.7)
Eosinophils Relative: 3 %
HEMATOCRIT: 46 % (ref 39.0–52.0)
HEMOGLOBIN: 14.9 g/dL (ref 13.0–17.0)
IMMATURE GRANULOCYTES: 0 %
LYMPHS ABS: 1.2 10*3/uL (ref 0.7–4.0)
Lymphocytes Relative: 23 %
MCH: 31.2 pg (ref 26.0–34.0)
MCHC: 32.4 g/dL (ref 30.0–36.0)
MCV: 96.4 fL (ref 78.0–100.0)
MONOS PCT: 13 %
Monocytes Absolute: 0.7 10*3/uL (ref 0.1–1.0)
NEUTROS PCT: 60 %
Neutro Abs: 3.1 10*3/uL (ref 1.7–7.7)
Platelets: 167 10*3/uL (ref 150–400)
RBC: 4.77 MIL/uL (ref 4.22–5.81)
RDW: 12.5 % (ref 11.5–15.5)
WBC: 5.2 10*3/uL (ref 4.0–10.5)

## 2017-08-17 LAB — COMPREHENSIVE METABOLIC PANEL
ALK PHOS: 67 U/L (ref 38–126)
ALT: 9 U/L (ref 0–44)
AST: 22 U/L (ref 15–41)
Albumin: 4.1 g/dL (ref 3.5–5.0)
Anion gap: 14 (ref 5–15)
BILIRUBIN TOTAL: 1.5 mg/dL — AB (ref 0.3–1.2)
BUN: 16 mg/dL (ref 8–23)
CALCIUM: 9.2 mg/dL (ref 8.9–10.3)
CO2: 24 mmol/L (ref 22–32)
Chloride: 100 mmol/L (ref 98–111)
Creatinine, Ser: 1.82 mg/dL — ABNORMAL HIGH (ref 0.61–1.24)
GFR calc Af Amer: 39 mL/min — ABNORMAL LOW (ref >60)
GFR calc non Af Amer: 34 mL/min — ABNORMAL LOW (ref >60)
Glucose, Bld: 99 mg/dL (ref 70–99)
Potassium: 3.9 mmol/L (ref 3.5–5.1)
SODIUM: 138 mmol/L (ref 135–145)
TOTAL PROTEIN: 7.5 g/dL (ref 6.5–8.1)

## 2017-08-17 LAB — CBG MONITORING, ED: Glucose-Capillary: 98 mg/dL (ref 70–99)

## 2017-08-17 LAB — TROPONIN I: Troponin I: <0.03 ng/mL (ref <0.03)

## 2017-08-17 MED ORDER — ONDANSETRON 4 MG PO TBDP
4.0000 mg | ORAL_TABLET | Freq: Once | ORAL | Status: AC
  Administered 2017-08-17: 4 mg via ORAL
  Filled 2017-08-17: qty 1

## 2017-08-17 MED ORDER — LABETALOL HCL 5 MG/ML IV SOLN
10.0000 mg | Freq: Once | INTRAVENOUS | Status: AC
  Administered 2017-08-17: 10 mg via INTRAVENOUS
  Filled 2017-08-17: qty 4

## 2017-08-17 NOTE — ED Provider Notes (Signed)
[facility] Provider Note   CSN: [id number] Arrival date & time: 08/17/17  4818     History   Chief Complaint Chief Complaint  Patient presents with  . Dizziness  . Abdominal Pain    HPI Henry Adams is a 77 y.o. male.  Patient with history of anxiety, coarse tremor, arthritis, high blood pressure compliant with medications, kidney disease presents with lightheaded/dizziness and nausea for the past few days.  Symptoms intermittent.  Patient feels different than previous.  Patient did stop taking trazodone around 3 to 4 days ago before symptoms started.  No chest pain or shortness of breath.  No focal neurologic deficits.  No history of stroke.  No fevers or chills.  Patient's blood pressure has been running high today 180s which is atypical for him.  Decreased bowel movements the past 3 days patient is passing gas.  Patient denies abdominal pain however feels nauseous.     Past Medical History:  Diagnosis Date  . Allergy    perennial  . CKD (chronic kidney disease)   . GERD (gastroesophageal reflux disease)   . HLD (hyperlipidemia)   . Hx of cardiovascular stress test    ETT-Myoview 6/14: probable low risk study, inf defect with normal inf wall motion-cannot r/o ischemia; prominent gut uptake adjacent to inf wall on rest images-poss defect is atten, EF 63%  . Hypertension   . Polycystic kidney disease     Patient Active Problem List   Diagnosis Date Noted  . Bilateral leg edema 04/05/2017  . Left knee pain 04/03/2017  . Age-related cognitive decline 01/09/2017  . Anxiety and depression 10/24/2016  . Insomnia 10/24/2016  . BPH associated with nocturia 08/11/2015  . Routine general medical examination at a health care facility 08/11/2015  . Coarse tremor 04/04/2015  . Vocal cord dysfunction 11/04/2013  . Essential hypertension 04/26/2007  . Allergic rhinitis 04/26/2007  . DEGENERATIVE JOINT DISEASE 04/26/2007  . G E R D  08/05/2006  . Helicobacter pylori infection 05/30/2006  . Hyperlipidemia with target LDL less than 160 05/30/2006  . Deficiency anemia 05/30/2006  . Polycystic kidney 05/30/2006  . Diverticulosis of large intestine 07/15/2001    Past Surgical History:  Procedure Laterality Date  . COLONOSCOPY  2011   diverticulosis  . HERNIA REPAIR     umbilical  . KNEE SURGERY Left   . medialization laryngoplasty  10/15/2013   Dr Joya Gaskins, [facility]  . TONSILLECTOMY AND ADENOIDECTOMY          Home Medications    Prior to Admission medications   Medication Sig Start Date End Date Taking? Authorizing Provider  azelastine (ASTELIN) 137 MCG/SPRAY nasal spray Place 1 spray into both nostrils at bedtime. Use in left nostril   Yes [provider]  buPROPion (WELLBUTRIN XL) 300 MG 24 hr tablet Take 1 tablet (300 mg total) by mouth daily. 07/01/17  Yes Tat, Eustace Quail, DO  carbidopa-levodopa (SINEMET IR) 25-100 MG tablet Take 1 tablet by mouth 4 (four) times daily. 07/08/17  Yes Tat, Eustace Quail, DO  Carbidopa-Levodopa ER (SINEMET CR) 25-100 MG tablet controlled release Take 1 tablet by mouth 2 (two) times daily. 03/12/17  Yes Tat, Eustace Quail, DO  fexofenadine (ALLEGRA) 180 MG tablet Take 180 mg by mouth daily.     Yes [provider]  hydrochlorothiazide (MICROZIDE) 12.5 MG capsule  06/26/15  Yes [provider]  omeprazole (PRILOSEC) 40 MG capsule TAKE (1) CAPSULE DAILY. 06/24/17  Yes Scarlette Calico  L, MD  telmisartan (MICARDIS) 40 MG tablet Take 20 mg by mouth daily.    Yes [provider]  buPROPion (WELLBUTRIN XL) 150 MG 24 hr tablet Take 1 tablet (150 mg total) by mouth daily. Patient not taking: Reported on 08/17/2017 07/01/17   Tat, Eustace Quail, DO  carbidopa-levodopa (SINEMET IR) 25-100 MG tablet Take 1 tablet by mouth 3 (three) times daily. Patient not taking: Reported on 08/17/2017 02/04/17   Tat, Eustace Quail, DO  escitalopram (LEXAPRO) 20 MG tablet Take 1 tablet (20 mg total) daily  by mouth. Patient not taking: Reported on 08/17/2017 12/05/16   [name], MD  traZODone (DESYREL) 50 MG tablet TAKE 1 OR 2 TABLETS AT BEDTIME AS NEEDED FOR SLEEP. Patient not taking: Reported on 08/17/2017 04/17/17   [name], MD    Family History Family History  Problem Relation Age of Onset  . Hypertension Father   . Cancer Sister        breast  . Heart disease Brother        heart transplant  . Cancer Daughter        breast  . Stroke Sister        >34  . Skin cancer Brother        squamous cell    Social History Social History   Tobacco Use  . Smoking status: Never Smoker  . Smokeless tobacco: Never Used  Substance Use Topics  . Alcohol use: Yes    Alcohol/week: 0.0 oz    Comment:  2-3 beers / month  . Drug use: No     Allergies   Iodine; Diazepam; Oxycodone; Pollen extract; Tamsulosin; Doxazosin; Etodolac; and Tape   Review of Systems Review of Systems  Constitutional: Positive for fatigue. Negative for chills and fever.  HENT: Negative for congestion.   Eyes: Negative for visual disturbance.  Respiratory: Negative for shortness of breath.   Cardiovascular: Negative for chest pain.  Gastrointestinal: Positive for vomiting. Negative for abdominal pain and blood in stool.  Genitourinary: Negative for dysuria and flank pain.  Musculoskeletal: Negative for back pain, neck pain and neck stiffness.  Skin: Negative for rash.  Neurological: Positive for light-headedness. Negative for weakness, numbness and headaches.     Physical Exam Updated Vital Signs BP (!) 157/109   Pulse 65   Resp 13   Ht 5' 11.5" (1.816 m)   Wt 71.7 kg (158 lb)   SpO2 99%   BMI 21.73 kg/m   Physical Exam  Constitutional: He is oriented to person, place, and time. He appears well-developed and well-nourished.  HENT:  Head: Normocephalic and atraumatic.  Eyes: Conjunctivae are normal. Right eye exhibits no discharge. Left eye exhibits no discharge.  Neck: Normal range  of motion. Neck supple. No tracheal deviation present.  Cardiovascular: Normal rate and regular rhythm.  Pulmonary/Chest: Effort normal and breath sounds normal.  Abdominal: Soft. He exhibits no distension. There is no tenderness. There is no guarding.  Musculoskeletal: He exhibits no edema.  Neurological: He is alert and oriented to person, place, and time.  5+ strength in UE and LE with f/e at major joints. Sensation to palpation intact in UE and LE. CNs 2-12 grossly intact.  EOMFI.  PERRL.   Finger nose and coordination intact bilateral.   Visual fields intact to finger testing. No nystagmus Flat affect  Skin: Skin is warm. No rash noted.  Psychiatric: He has a normal mood and affect.  Nursing note and vitals reviewed.  ED Treatments / Results  Labs (all labs ordered are listed, but only abnormal results are displayed) Labs Reviewed  COMPREHENSIVE METABOLIC PANEL - Abnormal; Notable for the following components:      Result Value   Creatinine, Ser 1.82 (*)    Total Bilirubin 1.5 (*)    GFR calc non Af Amer 34 (*)    GFR calc Af Amer 39 (*)    All other components within normal limits  CBC WITH DIFFERENTIAL/PLATELET  TROPONIN I  URINALYSIS, ROUTINE W REFLEX MICROSCOPIC  CBG MONITORING, ED    EKG EKG Interpretation  Date/Time:  Saturday August 17 2017 09:05:42 EDT Ventricular Rate:  69 PR Interval:    QRS Duration: 105 QT Interval:  413 QTC Calculation: 443 R Axis:   -55 Text Interpretation:  Sinus rhythm Left anterior fascicular block Abnormal R-wave progression, early transition Confirmed by Elnora Morrison [phone]) on 08/17/2017 4:42:16 PM   Radiology Ct Head Wo Contrast  Result Date: 08/17/2017 CLINICAL DATA:  Dizziness. Hypertension. Vertigo. No reported injury. EXAM: CT HEAD WITHOUT CONTRAST TECHNIQUE: Contiguous axial images were obtained from the base of the skull through the vertex without intravenous contrast. COMPARISON:  04/25/2015 brain MRI. FINDINGS:  Brain: No evidence of parenchymal hemorrhage or extra-axial fluid collection. No mass lesion, mass effect, or midline shift. No CT evidence of acute infarction. Nonspecific mild subcortical and periventricular white matter hypodensity, most in keeping with chronic small vessel ischemic change. Cerebral volume is age appropriate. No ventriculomegaly. Vascular: No acute abnormality. Skull: No evidence of calvarial fracture. Sinuses/Orbits: The visualized paranasal sinuses are essentially clear. Other: Metallic structure in the right external auditory canal, presumably a hearing aid, correlate clinically. The mastoid air cells are unopacified. IMPRESSION: 1.  No evidence of acute intracranial abnormality. 2. Mild chronic small vessel ischemic changes in the cerebral white matter. Electronically Signed   By: Ilona Sorrel M.D.   On: 08/17/2017 11:29    Procedures Procedures (including critical care time)  Medications Ordered in ED Medications  ondansetron (ZOFRAN-ODT) disintegrating tablet 4 mg (4 mg Oral Given 08/17/17 1052)  labetalol (NORMODYNE,TRANDATE) injection 10 mg (10 mg Intravenous Given 08/17/17 1158)     Initial Impression / Assessment and Plan / ED Course  I have reviewed the triage vital signs and the nursing notes.  Pertinent labs & imaging results that were available during my care of the patient were reviewed by me and considered in my medical decision making (see chart for details).    Patient presents with lightheaded and nausea symptoms possibly related to stopping trazodone based on timing.  Discussed broad differential diagnosis and screening blood work in the ER to check for anemia, electrolyte issues, atypical cardiac, CT scan of the head with high blood pressure and dizziness to look for small hypertensive bleed.  Patient does have neurologist primary doctor to follow-up with.  Discussed with patient and daughter in the room were comfortable with this plan.  Patient has no  abdominal tenderness or pain during visit.  Labetalol IV dose given due to persistent high blood pressure and possible relation to vague symptoms.  Blood pressure improved to 956 systolic.  Blood work reviewed overall unremarkable except for small elevation and kidney function.  CT scan of the head negative.  Patient has no abdominal pain in the ER.  Discussed outpatient follow-up, restarting trazodone at half dose to see if it improves symptoms and follow-up with primary doctor early next week.  Results and differential diagnosis were discussed with the patient/parent/guardian.  Xrays were independently reviewed by myself.  Close follow up outpatient was discussed, comfortable with the plan.   Medications  ondansetron (ZOFRAN-ODT) disintegrating tablet 4 mg (4 mg Oral Given 08/17/17 1052)  labetalol (NORMODYNE,TRANDATE) injection 10 mg (10 mg Intravenous Given 08/17/17 1158)    Vitals:   08/17/17 1201 08/17/17 1215 08/17/17 1230 08/17/17 1300  BP: (!) 149/94  (!) 155/92 (!) 157/109  Pulse: 69 66 69 65  Resp: 19 18 14 13   SpO2: 96% 98% 97% 99%  Weight:      Height:        Final diagnoses:  Lightheaded  Nausea     Final Clinical Impressions(s) / ED Diagnoses   Final diagnoses:  Lightheaded  Nausea    ED Discharge Orders    None       Elnora Morrison, MD 08/17/17 1642

## 2017-08-17 NOTE — Discharge Instructions (Addendum)
Take half trazadone dose until you see your doctor next week. Stay well hydrated. Return to the emergency room if you develop chest pain, persistent abdominal pain, fevers, vomiting, blood in the stools or other concerns.   If you were given medicines take as directed.  If you are on coumadin or contraceptives realize their levels and effectiveness is altered by many different medicines.  If you have any reaction (rash, tongues swelling, other) to the medicines stop taking and see a physician.    If your blood pressure was elevated in the ER make sure you follow up for management with a primary doctor or return for chest pain, shortness of breath or stroke symptoms.  Please follow up as directed and return to the ER or see a physician for new or worsening symptoms.  Thank you. Vitals:   08/17/17 0903 08/17/17 0915 08/17/17 0930 08/17/17 0945  BP:  (!) 194/130 (!) 181/106 (!) 189/116  Pulse:  73 67 71  Resp:  14 16 19   SpO2:  99% 98% 99%  Weight: 71.7 kg (158 lb)     Height: 5' 11.5" (1.816 m)

## 2017-08-17 NOTE — ED Triage Notes (Signed)
Pt arrives with daughter from home with c/o 3 day hx of dizziness and abdominal discomfort. Pt states he has home bp of 170. Pt states med change from lexapro to wellbutrin and stopped trazodone.

## 2017-08-17 NOTE — ED Notes (Signed)
Patient transported to CT 

## 2017-08-18 ENCOUNTER — Telehealth: Payer: Self-pay | Admitting: Neurology

## 2017-08-18 ENCOUNTER — Encounter: Payer: Self-pay | Admitting: Neurology

## 2017-08-18 NOTE — Telephone Encounter (Signed)
Received call from on-call nurse.  Patient has been experiencing dizziness,  , increased anxiety and intermittent headache for the past couple of days.  She thinks it is due to Wellbutrin.  Dr. Carles Collet started patient on Wellbutrin 150mg  daily on 6/10 with instructions to increase to 300mg  in 4 weeks.  Was seen in ED yesterday.  Blood pressure elevated.  It was believed that he was experiencing withdrawal symptoms from discontinuing trazodone too quickly 4 days ago.  Blood pressure was treated and he was restarted on trazodone. He is still feeling dizzy and nauseous.  The daughter thinks it is due to Wellbutrin.  Dose was increased to 300mg  about 2 1/2 weeks ago.  Advised to decrease dose to 150mg  and see if symptoms improve.  He should follow up with PCP to evaluate for any other causes of symptoms or possibly other medication.

## 2017-08-19 ENCOUNTER — Ambulatory Visit (INDEPENDENT_AMBULATORY_CARE_PROVIDER_SITE_OTHER)
Admission: RE | Admit: 2017-08-19 | Discharge: 2017-08-19 | Disposition: A | Discharge status: Home / Self Care | Payer: Medicare Other | Source: Ambulatory Visit | Attending: Internal Medicine | Admitting: Internal Medicine

## 2017-08-19 ENCOUNTER — Telehealth: Payer: Self-pay | Admitting: Neurology

## 2017-08-19 ENCOUNTER — Ambulatory Visit: Payer: Medicare Other | Admitting: Internal Medicine

## 2017-08-19 ENCOUNTER — Encounter: Payer: Self-pay | Admitting: Internal Medicine

## 2017-08-19 DIAGNOSIS — R1900 Intra-abdominal and pelvic swelling, mass and lump, unspecified site: Secondary | ICD-10-CM | POA: Insufficient documentation

## 2017-08-19 DIAGNOSIS — I10 Essential (primary) hypertension: Secondary | ICD-10-CM | POA: Diagnosis not present

## 2017-08-19 DIAGNOSIS — K5900 Constipation, unspecified: Secondary | ICD-10-CM | POA: Diagnosis not present

## 2017-08-19 DIAGNOSIS — F329 Major depressive disorder, single episode, unspecified: Secondary | ICD-10-CM

## 2017-08-19 DIAGNOSIS — K5903 Drug induced constipation: Secondary | ICD-10-CM

## 2017-08-19 DIAGNOSIS — K5904 Chronic idiopathic constipation: Secondary | ICD-10-CM | POA: Insufficient documentation

## 2017-08-19 DIAGNOSIS — F419 Anxiety disorder, unspecified: Secondary | ICD-10-CM

## 2017-08-19 DIAGNOSIS — R1901 Right upper quadrant abdominal swelling, mass and lump: Secondary | ICD-10-CM

## 2017-08-19 MED ORDER — BUPROPION HCL ER (XL) 150 MG PO TB24: 150.0000 mg | ORAL_TABLET | Freq: Every day | ORAL | 5 refills | Status: DC

## 2017-08-19 MED ORDER — LINACLOTIDE 145 MCG PO CAPS: 145.0000 ug | ORAL_CAPSULE | Freq: Every day | ORAL | 3 refills | Status: DC | PRN

## 2017-08-19 NOTE — Assessment & Plan Note (Addendum)
stool vs mass abd [place] abd X ray May need a CT F/u w/dr Geronimo Boot po

## 2017-08-19 NOTE — Telephone Encounter (Signed)
LMOM letting [name], patient's daughter, know per Dr. Georgie Chard message that he has reduced Wellbutrin and advised for them to f/u with PCP to r/o any other reason for symptoms. I let them know that he is on wait list for sooner appt, but proceed with reducing Wellbutrin and call with any questions.

## 2017-08-19 NOTE — Telephone Encounter (Signed)
Spoke with daughter and clarified dosing for all the medications. Discussed keeping Levodopa on time being more important than keeping away from protein (he would wait two hours later to take medication because his eating routine changes so often) but did encourage taking away from protein. Will call with any other questions.

## 2017-08-19 NOTE — Assessment & Plan Note (Signed)
BP Readings from Last 3 Encounters:  08/19/17 114/76  08/17/17 (!) 157/109  07/01/17 110/62  better

## 2017-08-19 NOTE — Assessment & Plan Note (Signed)
[place] X ray

## 2017-08-19 NOTE — Telephone Encounter (Signed)
Patient's daughter returned your call. Thanks

## 2017-08-19 NOTE — Telephone Encounter (Signed)
Patient's daughter called due to him being in the hospital. I explained to her that he is to follow up with his PCP she said Dr. Carles Collet is the one who prescribed his medication and shouldn't he see her? I explained to her that he is on a wait list for the next available. Thanks

## 2017-08-19 NOTE — Patient Instructions (Signed)
F/u w/dr Ronnald Ramp

## 2017-08-19 NOTE — Assessment & Plan Note (Signed)
Reduce Wellbutrin XL to 150 mg/d Trazodone 1/2 tab

## 2017-08-19 NOTE — Progress Notes (Signed)
Subjective:  Patient ID: Henry Adams, male    DOB: 1940-05-17  Age: 77 y.o. MRN: [id number]  CC: No chief complaint on file.   HPI Henry Adams presents for a post-ER visit on 7/27 for tremor, anxiety, high BP He switched from lexapro to Wellbutrin and was tapering off his Trazodone this month. Seeing Dr Tat  Outpatient Medications Prior to Visit  Medication Sig Dispense Refill  . buPROPion (WELLBUTRIN XL) 300 MG 24 hr tablet Take 1 tablet (300 mg total) by mouth daily. 90 tablet 1  . carbidopa-levodopa (SINEMET IR) 25-100 MG tablet Take 1 tablet by mouth 4 (four) times daily. 120 tablet 5  . Carbidopa-Levodopa ER (SINEMET CR) 25-100 MG tablet controlled release Take 1 tablet by mouth 2 (two) times daily. 90 tablet 1  . fexofenadine (ALLEGRA) 180 MG tablet Take 180 mg by mouth daily.      . hydrochlorothiazide (MICROZIDE) 12.5 MG capsule   3  . omeprazole (PRILOSEC) 40 MG capsule TAKE (1) CAPSULE DAILY. 90 capsule 0  . telmisartan (MICARDIS) 40 MG tablet Take 20 mg by mouth daily.     . traZODone (DESYREL) 50 MG tablet TAKE 1 OR 2 TABLETS AT BEDTIME AS NEEDED FOR SLEEP. 90 tablet 1  . azelastine (ASTELIN) 137 MCG/SPRAY nasal spray Place 1 spray into both nostrils at bedtime. Use in left nostril    . buPROPion (WELLBUTRIN XL) 150 MG 24 hr tablet Take 1 tablet (150 mg total) by mouth daily. (Patient not taking: Reported on 08/17/2017) 30 tablet 0  . carbidopa-levodopa (SINEMET IR) 25-100 MG tablet Take 1 tablet by mouth 3 (three) times daily. (Patient not taking: Reported on 08/17/2017) 90 tablet 3  . escitalopram (LEXAPRO) 20 MG tablet Take 1 tablet (20 mg total) daily by mouth. (Patient not taking: Reported on 08/17/2017) 90 tablet 1   No facility-administered medications prior to visit.     ROS: Review of Systems  Constitutional: Negative for appetite change, fatigue and unexpected weight change.  HENT: Negative for congestion, nosebleeds, sneezing, sore throat and trouble  swallowing.   Eyes: Negative for itching and visual disturbance.  Respiratory: Negative for cough.   Cardiovascular: Negative for chest pain, palpitations and leg swelling.  Gastrointestinal: Negative for abdominal distention, blood in stool, diarrhea and nausea.  Genitourinary: Negative for frequency and hematuria.  Musculoskeletal: Negative for back pain, gait problem, joint swelling and neck pain.  Skin: Negative for rash.  Neurological: Positive for dizziness, tremors and weakness. Negative for speech difficulty.  Psychiatric/Behavioral: Positive for dysphoric mood. Negative for agitation, sleep disturbance and suicidal ideas. The patient is nervous/anxious.     Objective:  BP 114/76 (BP Location: Left Arm, Patient Position: Sitting, Cuff Size: Normal)   Pulse 64   Temp 98.5 F (36.9 C) (Oral)   Ht 5' 11.5" (1.816 m)   Wt 158 lb (71.7 kg)   SpO2 98%   BMI 21.73 kg/m   BP Readings from Last 3 Encounters:  08/19/17 114/76  08/17/17 (!) 157/109  07/01/17 110/62    Wt Readings from Last 3 Encounters:  08/19/17 158 lb (71.7 kg)  08/17/17 158 lb (71.7 kg)  07/01/17 164 lb (74.4 kg)    Physical Exam  Constitutional: He is oriented to person, place, and time. He appears well-developed. No distress.  NAD  HENT:  Mouth/Throat: Oropharynx is clear and moist.  Eyes: Pupils are equal, round, and reactive to light. Conjunctivae are normal.  Neck: Normal range of motion. No JVD present.  No thyromegaly present.  Cardiovascular: Normal rate, regular rhythm, normal heart sounds and intact distal pulses. Exam reveals no gallop and no friction rub.  No murmur heard. Pulmonary/Chest: Effort normal and breath sounds normal. No respiratory distress. He has no wheezes. He has no rales. He exhibits no tenderness.  Abdominal: Soft. Bowel sounds are normal. He exhibits no distension and no mass. There is no tenderness. There is no rebound and no guarding.  Musculoskeletal: Normal range of  motion. He exhibits no edema or tenderness.  Lymphadenopathy:    He has no cervical adenopathy.  Neurological: He is alert and oriented to person, place, and time. He has normal reflexes. No cranial nerve deficit. He exhibits normal muscle tone. He displays a negative Romberg sign. Coordination and gait normal.  Skin: Skin is warm and dry. No rash noted.  Psychiatric: He has a normal mood and affect. His behavior is normal. Judgment and thought content normal.  somewhat flat affect R 1/2 abs w/a large pasty mass, hard to define  Lab Results  Component Value Date   WBC 5.2 08/17/2017   HGB 14.9 08/17/2017   HCT 46.0 08/17/2017   PLT 167 08/17/2017   GLUCOSE 99 08/17/2017   CHOL 179 11/14/2016   TRIG 73.0 11/14/2016   HDL 68.50 11/14/2016   LDLDIRECT 131.7 07/02/2011   LDLCALC 96 11/14/2016   ALT 9 08/17/2017   AST 22 08/17/2017   NA 138 08/17/2017   K 3.9 08/17/2017   CL 100 08/17/2017   CREATININE 1.82 (H) 08/17/2017   BUN 16 08/17/2017   CO2 24 08/17/2017   TSH 1.61 11/14/2016   PSA 2.28 08/11/2015   INR 1.1 (H) 08/01/2012   HGBA1C 5.9 12/29/2009    Ct Head Wo Contrast  Result Date: 08/17/2017 CLINICAL DATA:  Dizziness. Hypertension. Vertigo. No reported injury. EXAM: CT HEAD WITHOUT CONTRAST TECHNIQUE: Contiguous axial images were obtained from the base of the skull through the vertex without intravenous contrast. COMPARISON:  04/25/2015 brain MRI. FINDINGS: Brain: No evidence of parenchymal hemorrhage or extra-axial fluid collection. No mass lesion, mass effect, or midline shift. No CT evidence of acute infarction. Nonspecific mild subcortical and periventricular white matter hypodensity, most in keeping with chronic small vessel ischemic change. Cerebral volume is age appropriate. No ventriculomegaly. Vascular: No acute abnormality. Skull: No evidence of calvarial fracture. Sinuses/Orbits: The visualized paranasal sinuses are essentially clear. Other: Metallic structure in  the right external auditory canal, presumably a hearing aid, correlate clinically. The mastoid air cells are unopacified. IMPRESSION: 1.  No evidence of acute intracranial abnormality. 2. Mild chronic small vessel ischemic changes in the cerebral white matter. Electronically Signed   By: Ilona Sorrel M.D.   On: 08/17/2017 11:29    Assessment & Plan:   There are no diagnoses linked to this encounter.   No orders of the defined types were placed in this encounter.    Follow-up: No follow-ups on file.  Walker Kehr, MD

## 2017-08-21 ENCOUNTER — Telehealth: Payer: Self-pay | Admitting: Internal Medicine

## 2017-08-21 NOTE — Telephone Encounter (Signed)
Pt dtr contacted. Informed PA has been started and that I would contact her as soon as I have a response. Dtr stated understanding.

## 2017-08-21 NOTE — Telephone Encounter (Signed)
Did either of you two call patient?  Looks like there was a CRM today from the daughter stating patient has not yet had a BM.

## 2017-08-21 NOTE — Telephone Encounter (Signed)
Copied from [place] [phone]. Topic: Quick Communication - Office Called Patient >> Aug 21, 2017 12:54 PM Neva Seat wrote: Pt's daughter returned missed call.  Please call her back.

## 2017-08-22 NOTE — Telephone Encounter (Signed)
PA has been approved.  Can you inform pt dtr of same?

## 2017-08-23 NOTE — Telephone Encounter (Signed)
I don't think that I d/c the trazodone.  Looks like the only thing I did was d/c lexapro and start wellbutrin.  Agree with Dr. Tomi Likens

## 2017-08-23 NOTE — Telephone Encounter (Signed)
Daughter informed

## 2017-08-27 ENCOUNTER — Encounter: Payer: Self-pay | Admitting: Internal Medicine

## 2017-08-27 ENCOUNTER — Ambulatory Visit: Payer: Medicare Other | Admitting: Internal Medicine

## 2017-08-27 ENCOUNTER — Other Ambulatory Visit: Payer: Self-pay | Admitting: Neurology

## 2017-08-27 VITALS — BP 138/88 | HR 73 | Temp 97.9°F | Resp 16 | Ht 71.5 in | Wt 154.0 lb

## 2017-08-27 DIAGNOSIS — I10 Essential (primary) hypertension: Secondary | ICD-10-CM | POA: Diagnosis not present

## 2017-08-27 DIAGNOSIS — K5904 Chronic idiopathic constipation: Secondary | ICD-10-CM | POA: Diagnosis not present

## 2017-08-27 MED ORDER — LINACLOTIDE 290 MCG PO CAPS: 290.0000 ug | ORAL_CAPSULE | Freq: Every day | ORAL | 1 refills | Status: DC

## 2017-08-27 NOTE — Progress Notes (Signed)
Subjective:  Patient ID: Henry Adams, male    DOB: 15-Sep-1940  Age: 78 y.o. MRN: [id number]  CC: Constipation   HPI Henry Adams presents for f/up - He was seen recently for worsening constipation and abdominal bloating.  He has tried Linzess but since his last visit has only had 3 bowel movements.  Continues to complain that his abdomen feels bloated.  He denies nausea, vomiting, diarrhea, loss of appetite, or weight loss.  Outpatient Medications Prior to Visit  Medication Sig Dispense Refill  . buPROPion (WELLBUTRIN XL) 150 MG 24 hr tablet Take 1 tablet (150 mg total) by mouth daily. 30 tablet 5  . carbidopa-levodopa (SINEMET IR) 25-100 MG tablet Take 1 tablet by mouth 4 (four) times daily. 120 tablet 5  . Carbidopa-Levodopa ER (SINEMET CR) 25-100 MG tablet controlled release Take 1 tablet by mouth 2 (two) times daily. 90 tablet 1  . fexofenadine (ALLEGRA) 180 MG tablet Take 180 mg by mouth daily.      [name] omeprazole (PRILOSEC) 40 MG capsule TAKE (1) CAPSULE DAILY. 90 capsule 0  . telmisartan (MICARDIS) 40 MG tablet Take 20 mg by mouth daily.     . traZODone (DESYREL) 50 MG tablet TAKE 1 OR 2 TABLETS AT BEDTIME AS NEEDED FOR SLEEP. 90 tablet 1  . hydrochlorothiazide (MICROZIDE) 12.5 MG capsule   3  . linaclotide (LINZESS) 145 MCG CAPS capsule Take 1-2 capsules (145-290 mcg total) by mouth daily as needed. 60 capsule 3   No facility-administered medications prior to visit.     ROS Review of Systems  Constitutional: Negative.  Negative for diaphoresis and fatigue.  HENT: Negative.   Eyes: Negative for visual disturbance.  Respiratory: Negative for cough, chest tightness and shortness of breath.   Cardiovascular: Negative for chest pain, palpitations and leg swelling.  Gastrointestinal: Positive for abdominal distention and constipation. Negative for abdominal pain, diarrhea, nausea and vomiting.  Endocrine: Negative.   Genitourinary: Negative.  Negative for difficulty urinating.    Musculoskeletal: Negative.  Negative for arthralgias, myalgias and neck pain.  Skin: Negative.  Negative for color change.  Neurological: Negative.  Negative for dizziness, weakness and numbness.  Hematological: Negative for adenopathy. Does not bruise/bleed easily.  Psychiatric/Behavioral: Negative.     Objective:  BP 138/88 (BP Location: Left Arm, Patient Position: Sitting, Cuff Size: Normal)   Pulse 73   Temp 97.9 F (36.6 C) (Oral)   Resp 16   Ht 5' 11.5" (1.816 m)   Wt 154 lb (69.9 kg)   SpO2 97%   BMI 21.18 kg/m   BP Readings from Last 3 Encounters:  08/27/17 138/88  08/19/17 114/76  08/17/17 (!) 157/109    Wt Readings from Last 3 Encounters:  08/27/17 154 lb (69.9 kg)  08/19/17 158 lb (71.7 kg)  08/17/17 158 lb (71.7 kg)    Physical Exam  Constitutional: He is oriented to person, place, and time. No distress.  HENT:  Mouth/Throat: Oropharynx is clear and moist. No oropharyngeal exudate.  Eyes: Conjunctivae are normal. No scleral icterus.  Neck: Normal range of motion. Neck supple. No JVD present. No thyromegaly present.  Cardiovascular: Normal rate, regular rhythm and normal heart sounds. Exam reveals no gallop.  No murmur heard. Pulmonary/Chest: Effort normal and breath sounds normal. No respiratory distress. He has no wheezes. He has no rales.  Abdominal: Soft. Bowel sounds are normal. He exhibits distension (mildly distended). He exhibits no mass. There is no hepatosplenomegaly. There is tenderness in the right upper  quadrant. There is no rigidity, no tenderness at McBurney's point and negative Murphy's sign. No hernia.  Musculoskeletal: Normal range of motion. He exhibits no edema, tenderness or deformity.  Lymphadenopathy:    He has no cervical adenopathy.  Neurological: He is alert and oriented to person, place, and time.  Skin: Skin is warm and dry. No rash noted. He is not diaphoretic.  Vitals reviewed.   Lab Results  Component Value Date   WBC 5.2  08/17/2017   HGB 14.9 08/17/2017   HCT 46.0 08/17/2017   PLT 167 08/17/2017   GLUCOSE 99 08/17/2017   CHOL 179 11/14/2016   TRIG 73.0 11/14/2016   HDL 68.50 11/14/2016   LDLDIRECT 131.7 07/02/2011   LDLCALC 96 11/14/2016   ALT 9 08/17/2017   AST 22 08/17/2017   NA 138 08/17/2017   K 3.9 08/17/2017   CL 100 08/17/2017   CREATININE 1.82 (H) 08/17/2017   BUN 16 08/17/2017   CO2 24 08/17/2017   TSH 1.61 11/14/2016   PSA 2.28 08/11/2015   INR 1.1 (H) 08/01/2012   HGBA1C 5.9 12/29/2009    Dg Abd 2 Views  Result Date: 08/19/2017 CLINICAL DATA:  Constipation EXAM: ABDOMEN - 2 VIEW COMPARISON:  11/25/2009 lumbar radiograph FINDINGS: No free air beneath the diaphragm. Lung bases are clear. Nonobstructed bowel-gas pattern with large volume of stool throughout the colon. No abnormal calcification IMPRESSION: Nonobstructed gas pattern with large amount of stool throughout the colon. Electronically Signed   By: Donavan Foil M.D.   On: 08/19/2017 16:50    Assessment & Plan:   Bianca was seen today for constipation.  Diagnoses and all orders for this visit:  Essential hypertension- His blood pressure is adequately well controlled. -     Thyroid Panel With TSH; Future  Chronic idiopathic constipation- He has not had a significant response to the current dose of Linzess so will increase the dose.  His recent labs were negative for secondary causes of constipation.  I will check a thyroid panel to see if it is related to hypothyroidism.  He and his daughter were told that he should increase his intake of fruits and vegetables today.  He is scheduled for an ultrasound of his right upper quadrant soon as he has a history of hepatic cysts. -     linaclotide (LINZESS) 290 MCG CAPS capsule; Take 1 capsule (290 mcg total) by mouth daily before breakfast. -     Thyroid Panel With TSH; Future   I have discontinued Josie Saunders. Win[place]iller's hydrochlorothiazide and linaclotide. I am also having him start on  linaclotide. Additionally, I am having him maintain his fexofenadine, telmisartan, Carbidopa-Levodopa ER, traZODone, omeprazole, carbidopa-levodopa, and buPROPion.  Meds ordered this encounter  Medications  . linaclotide (LINZESS) 290 MCG CAPS capsule    Sig: Take 1 capsule (290 mcg total) by mouth daily before breakfast.    Dispense:  90 capsule    Refill:  1     Follow-up: Return in about 1 month (around 09/24/2017).  Scarlette Calico, MD

## 2017-08-27 NOTE — Patient Instructions (Signed)

## 2017-09-03 DIAGNOSIS — J3081 Allergic rhinitis due to animal (cat) (dog) hair and dander: Secondary | ICD-10-CM | POA: Diagnosis not present

## 2017-09-03 DIAGNOSIS — J301 Allergic rhinitis due to pollen: Secondary | ICD-10-CM | POA: Diagnosis not present

## 2017-09-03 DIAGNOSIS — J3089 Other allergic rhinitis: Secondary | ICD-10-CM | POA: Diagnosis not present

## 2017-09-04 ENCOUNTER — Ambulatory Visit: Payer: Medicare Other | Admitting: Internal Medicine

## 2017-09-05 ENCOUNTER — Ambulatory Visit
Admission: RE | Admit: 2017-09-05 | Discharge: 2017-09-05 | Disposition: A | Discharge status: Home / Self Care | Payer: Medicare Other | Source: Ambulatory Visit | Attending: Internal Medicine | Admitting: Internal Medicine

## 2017-09-05 DIAGNOSIS — K7689 Other specified diseases of liver: Secondary | ICD-10-CM | POA: Diagnosis not present

## 2017-09-17 ENCOUNTER — Ambulatory Visit: Payer: Medicare Other | Admitting: Internal Medicine

## 2017-09-18 ENCOUNTER — Encounter: Payer: Self-pay | Admitting: Internal Medicine

## 2017-09-18 ENCOUNTER — Ambulatory Visit: Payer: Medicare Other | Admitting: Internal Medicine

## 2017-09-18 VITALS — BP 118/78 | HR 73 | Temp 97.7°F | Resp 16 | Ht 71.5 in | Wt 158.2 lb

## 2017-09-18 DIAGNOSIS — K5904 Chronic idiopathic constipation: Secondary | ICD-10-CM

## 2017-09-18 DIAGNOSIS — Z23 Encounter for immunization: Secondary | ICD-10-CM | POA: Diagnosis not present

## 2017-09-18 DIAGNOSIS — K8010 Calculus of gallbladder with chronic cholecystitis without obstruction: Secondary | ICD-10-CM | POA: Diagnosis not present

## 2017-09-18 NOTE — Progress Notes (Signed)
Subjective:  Patient ID: Henry Adams, male    DOB: 05/16/1940  Age: 77 y.o. MRN: [id number]  CC: Abdominal Pain   HPI [name] presents for f/up - He recently saw another physician for right upper quadrant abdominal pain and underwent an ultrasound.  The ultrasound showed a 2 cm gallstone with a thickened gallbladder wall.  Fortunately, the abdominal pain has resolved but he does continue to complain of intermittent episodes of nausea without vomiting.  He has had a good appetite and has gained 4 pounds since his last visit.  The ultrasound also revealed multiple renal cysts.  He recently saw a nephrologist and tells me that he was told by the nephrologist that his kidneys are okay.  He is willing to have his gallbladder removed so that he does not develop any complications from the gallstones.  Outpatient Medications Prior to Visit  Medication Sig Dispense Refill  . buPROPion (WELLBUTRIN XL) 150 MG 24 hr tablet Take 1 tablet (150 mg total) by mouth daily. 30 tablet 5  . carbidopa-levodopa (SINEMET IR) 25-100 MG tablet Take 1 tablet by mouth 4 (four) times daily. 120 tablet 5  . Carbidopa-Levodopa ER (SINEMET CR) 25-100 MG tablet controlled release Take 1 tablet by mouth 2 (two) times daily. 90 tablet 1  . fexofenadine (ALLEGRA) 180 MG tablet Take 180 mg by mouth daily.      [name] linaclotide (LINZESS) 290 MCG CAPS capsule Take 1 capsule (290 mcg total) by mouth daily before breakfast. 90 capsule 1  . omeprazole (PRILOSEC) 40 MG capsule TAKE (1) CAPSULE DAILY. 90 capsule 0  . telmisartan (MICARDIS) 40 MG tablet Take 20 mg by mouth daily.     . traZODone (DESYREL) 50 MG tablet TAKE 1 OR 2 TABLETS AT BEDTIME AS NEEDED FOR SLEEP. 90 tablet 1  . Carbidopa-Levodopa ER (SINEMET CR) 25-100 MG tablet controlled release TAKE 1 TABLET BY MOUTH TWICE DAILY. 90 tablet 1   No facility-administered medications prior to visit.     ROS Review of Systems  Constitutional: Negative for chills,  diaphoresis, fatigue and unexpected weight change.  HENT: Negative.   Eyes: Negative for visual disturbance.  Respiratory: Negative.  Negative for cough, chest tightness, shortness of breath and wheezing.   Cardiovascular: Negative for chest pain, palpitations and leg swelling.  Gastrointestinal: Positive for constipation and nausea. Negative for abdominal distention, abdominal pain and vomiting.  Endocrine: Negative.   Genitourinary: Negative.  Negative for difficulty urinating.  Musculoskeletal: Negative.   Skin: Negative.   Allergic/Immunologic: Negative.   Neurological: Negative.  Negative for dizziness and weakness.  Hematological: Negative for adenopathy. Does not bruise/bleed easily.  Psychiatric/Behavioral: Negative.     Objective:  BP 118/78 (BP Location: Left Arm, Patient Position: Sitting, Cuff Size: Normal)   Pulse 73   Temp 97.7 F (36.5 C) (Oral)   Resp 16   Ht 5' 11.5" (1.816 m)   Wt 158 lb 4 oz (71.8 kg)   SpO2 97%   BMI 21.76 kg/m   BP Readings from Last 3 Encounters:  09/18/17 118/78  08/27/17 138/88  08/19/17 114/76    Wt Readings from Last 3 Encounters:  09/18/17 158 lb 4 oz (71.8 kg)  08/27/17 154 lb (69.9 kg)  08/19/17 158 lb (71.7 kg)    Physical Exam  Constitutional: He is oriented to person, place, and time. No distress.  HENT:  Mouth/Throat: Oropharynx is clear and moist.  Eyes: Conjunctivae are normal. No scleral icterus.  Neck: Normal range  of motion. Neck supple. No JVD present. No thyromegaly present.  Cardiovascular: Normal rate, regular rhythm, normal heart sounds and intact distal pulses.  No murmur heard. Pulmonary/Chest: Effort normal and breath sounds normal. He has no wheezes. He has no rales.  Abdominal: Normal appearance and bowel sounds are normal. There is no hepatosplenomegaly. There is no tenderness. There is no rigidity and no guarding. No hernia.  Musculoskeletal: Normal range of motion. He exhibits no edema, tenderness or  deformity.  Lymphadenopathy:    He has no cervical adenopathy.  Neurological: He is alert and oriented to person, place, and time.  Skin: Skin is warm and dry. No rash noted. He is not diaphoretic.  Vitals reviewed.   Lab Results  Component Value Date   WBC 5.2 08/17/2017   HGB 14.9 08/17/2017   HCT 46.0 08/17/2017   PLT 167 08/17/2017   GLUCOSE 99 08/17/2017   CHOL 179 11/14/2016   TRIG 73.0 11/14/2016   HDL 68.50 11/14/2016   LDLDIRECT 131.7 07/02/2011   LDLCALC 96 11/14/2016   ALT 9 08/17/2017   AST 22 08/17/2017   NA 138 08/17/2017   K 3.9 08/17/2017   CL 100 08/17/2017   CREATININE 1.82 (H) 08/17/2017   BUN 16 08/17/2017   CO2 24 08/17/2017   TSH 1.61 11/14/2016   PSA 2.28 08/11/2015   INR 1.1 (H) 08/01/2012   HGBA1C 5.9 12/29/2009    US Abdomen Complete  Result Date: 09/05/2017 CLINICAL DATA:  Right upper quadrant abdominal mass. EXAM: ABDOMEN ULTRASOUND COMPLETE COMPARISON:  MRI April 11, 2011 FINDINGS: Gallbladder: Poorly shadowing echogenic foci identified consistent with small stones. The gallbladder wall measures 3.1 mm. No Murphy's sign or pericholecystic fluid. Common bile duct: Diameter: 5 mm Liver: There is a 3.1 cm cyst in the left hepatic lobe on images 20 and 22. This appears to contain a few thin septa. There is a 1.7 cm cyst in right hepatic lobe. The liver echogenicity is heterogeneous with no other discrete nodules or masses identified. Portal vein is patent on color Doppler imaging with normal direction of blood flow towards the liver. IVC: No abnormality visualized. Pancreas: Visualized portion unremarkable. Spleen: Size and appearance within normal limits. Right Kidney: Length: 36.1 cm. Contains numerous cysts, nearly completely replacing the right kidney with the largest measuring 12 cm. Left Kidney: Length: 35 cm. Almost completely replaced by numerous cysts with the largest measuring 16 cm. Abdominal aorta: No aneurysm visualized. Other findings: None.  IMPRESSION: 1. Innumerable cysts nearly replacing both kidneys consistent with polycystic kidney disease. 2. There are 2 cysts in the liver as above. Overall, the hepatic echotexture is heterogeneous with subtle, nondiscrete regions of decreased echogenicity. No other focal masses. Evaluation for small solid masses is limited. 3. There appears to be at least 19.5 mm shadowing stone in the gallbladder with mild wall thickening but no pericholecystic fluid or Murphy's sign. Recommend clinical correlation. A HIDA scan could further evaluate if clinically warranted. Electronically Signed   By: Dorise Bullion III M.D   On: 09/05/2017 09:56    Assessment & Plan:   Kino was seen today for abdominal pain.  Diagnoses and all orders for this visit:  Need for influenza vaccination -     Flu Vaccine QUAD 36+ mos IM  Calculus of gallbladder with chronic cholecystitis without obstruction- I have asked him to see general surgery to consider undergoing a cholecystectomy. -     Ambulatory referral to General Surgery  Chronic idiopathic constipation- This has  improved since he increased his Linzess dose to and to 90 mcg a day.   I am having Seward Speck maintain his fexofenadine, telmisartan, Carbidopa-Levodopa ER, traZODone, omeprazole, carbidopa-levodopa, buPROPion, and linaclotide.  No orders of the defined types were placed in this encounter.    Follow-up: Return if symptoms worsen or fail to improve.  Scarlette Calico, MD

## 2017-09-18 NOTE — Patient Instructions (Signed)
Cholelithiasis °Cholelithiasis is a form of gallbladder disease in which gallstones form in the gallbladder. The gallbladder is an organ that stores bile. Bile is made in the liver, and it helps to digest fats. Gallstones begin as small crystals and slowly grow into stones. They may cause no symptoms until the gallbladder tightens (contracts) and a gallstone is blocking the duct (gallbladder attack), which can cause pain. Cholelithiasis is also referred to as gallstones. °There are two main types of gallstones: °· Cholesterol stones. These are made of hardened cholesterol and are usually yellow-green in color. They are the most common type of gallstone. Cholesterol is a white, waxy, fat-like substance that is made in the liver. °· Pigment stones. These are dark in color and are made of a red-yellow substance that forms when hemoglobin from red blood cells breaks down (bilirubin). ° °What are the causes? °This condition may be caused by an imbalance in the substances that bile is made of. This can happen if the bile: °· Has too much bilirubin. °· Has too much cholesterol. °· Does not have enough bile salts. These salts help the body absorb and digest fats. ° °In some cases, this condition can also be caused by the gallbladder not emptying completely or often enough. °What increases the risk? °The following factors may make you more likely to develop this condition: °· Being male. °· Having multiple pregnancies. Health care providers sometimes advise removing diseased gallbladders before future pregnancies. °· Eating a diet that is heavy in fried foods, fat, and refined carbohydrates, like white bread and white rice. °· Being obese. °· Being older than age 40. °· Prolonged use of medicines that contain male hormones (estrogen). °· Having diabetes mellitus. °· Rapidly losing weight. °· Having a family history of gallstones. °· Being of American Indian or Mexican descent. °· Having an intestinal disease such as  Crohn disease. °· Having metabolic syndrome. °· Having cirrhosis. °· Having severe types of anemia such as sickle cell anemia. ° °What are the signs or symptoms? °In most cases, there are no symptoms. These are known as silent gallstones. If a gallstone blocks the bile ducts, it can cause a gallbladder attack. The main symptom of a gallbladder attack is sudden pain in the upper right abdomen. The pain usually comes at night or after eating a large meal. The pain can last for one or several hours and can spread to the right shoulder or chest. °If the bile duct is blocked for more than a few hours, it can cause infection or inflammation of the gallbladder, liver, or pancreas, which may cause: °· Nausea. °· Vomiting. °· Abdominal pain that lasts for 5 hours or more. °· Fever or chills. °· Yellowing of the skin or the whites of the eyes (jaundice). °· Dark urine. °· Light-colored stools. ° °How is this diagnosed? °This condition may be diagnosed based on: °· A physical exam. °· Your medical history. °· An ultrasound of your gallbladder. °· CT scan. °· MRI. °· Blood tests to check for signs of infection or inflammation. °· A scan of your gallbladder and bile ducts (biliary system) using nonharmful radioactive material and special cameras that can see the radioactive material (cholescintigram). This test checks to see how your gallbladder contracts and whether bile ducts are blocked. °· Inserting a small tube with a camera on the end (endoscope) through your mouth to inspect bile ducts and check for blockages (endoscopic retrograde cholangiopancreatogram). ° °How is this treated? °Treatment for gallstones depends on the   severity of the condition. Silent gallstones do not need treatment. If the gallstones cause a gallbladder attack or other symptoms, treatment may be required. Options for treatment include: °· Surgery to remove the gallbladder (cholecystectomy). This is the most common treatment. °· Medicines to dissolve  gallstones. These are most effective at treating small gallstones. You may need to take medicines for up to 6-12 months. °· Shock wave treatment (extracorporeal biliary lithotripsy). In this treatment, an ultrasound machine sends shock waves to the gallbladder to break gallstones into smaller pieces. These pieces can then be passed into the intestines or be dissolved by medicine. This is rarely used. °· Removing gallstones through endoscopic retrograde cholangiopancreatogram. A small basket can be attached to the endoscope and used to capture and remove gallstones. ° °Follow these instructions at home: °· Take over-the-counter and prescription medicines only as told by your health care provider. °· Maintain a healthy weight and follow a healthy diet. This includes: °? Reducing fatty foods, such as fried food. °? Reducing refined carbohydrates, like white bread and white rice. °? Increasing fiber. Aim for foods like almonds, fruit, and beans. °· Keep all follow-up visits as told by your health care provider. This is important. °Contact a health care provider if: °· You think you have had a gallbladder attack. °· You have been diagnosed with silent gallstones and you develop abdominal pain or indigestion. °Get help right away if: °· You have pain from a gallbladder attack that lasts for more than 2 hours. °· You have abdominal pain that lasts for more than 5 hours. °· You have a fever or chills. °· You have persistent nausea and vomiting. °· You develop jaundice. °· You have dark urine or light-colored stools. °Summary °· Cholelithiasis (also called gallstones) is a form of gallbladder disease in which gallstones form in the gallbladder. °· This condition is caused by an imbalance in the substances that make up bile. This can happen if the bile has too much cholesterol, too much bilirubin, or not enough bile salts. °· You are more likely to develop this condition if you are male, pregnant, using medicines with  estrogen, obese, older than age 40, or have a family history of gallstones. You may also develop gallstones if you have diabetes, an intestinal disease, cirrhosis, or metabolic syndrome. °· Treatment for gallstones depends on the severity of the condition. Silent gallstones do not need treatment. °· If gallstones cause a gallbladder attack or other symptoms, treatment may be needed. The most common treatment is surgery to remove the gallbladder. °This information is not intended to replace advice given to you by your health care provider. Make sure you discuss any questions you have with your health care provider. °Document Released: 01/04/2005 Document Revised: 09/25/2015 Document Reviewed: 09/25/2015 °Elsevier Interactive Patient Education © 2018 [employer] ° °

## 2017-09-20 DIAGNOSIS — J301 Allergic rhinitis due to pollen: Secondary | ICD-10-CM | POA: Diagnosis not present

## 2017-09-20 DIAGNOSIS — J3081 Allergic rhinitis due to animal (cat) (dog) hair and dander: Secondary | ICD-10-CM | POA: Diagnosis not present

## 2017-09-20 DIAGNOSIS — J3089 Other allergic rhinitis: Secondary | ICD-10-CM | POA: Diagnosis not present

## 2017-09-24 ENCOUNTER — Other Ambulatory Visit: Payer: Self-pay | Admitting: Internal Medicine

## 2017-09-24 ENCOUNTER — Telehealth: Payer: Self-pay | Admitting: Internal Medicine

## 2017-09-24 DIAGNOSIS — K219 Gastro-esophageal reflux disease without esophagitis: Secondary | ICD-10-CM

## 2017-09-24 DIAGNOSIS — A048 Other specified bacterial intestinal infections: Secondary | ICD-10-CM

## 2017-09-24 NOTE — Telephone Encounter (Signed)
Patient has dropped off [facility] physicians report to be completed for [place] at [facility].  Patient was seen last month.  Patient states he does need form completed by Friday.  Please follow up with patient in regard to any questions or once form is completed.

## 2017-09-25 ENCOUNTER — Other Ambulatory Visit (HOSPITAL_COMMUNITY): Payer: Self-pay | Admitting: General Surgery

## 2017-09-25 DIAGNOSIS — K8020 Calculus of gallbladder without cholecystitis without obstruction: Secondary | ICD-10-CM | POA: Diagnosis not present

## 2017-09-25 DIAGNOSIS — K5900 Constipation, unspecified: Secondary | ICD-10-CM | POA: Diagnosis not present

## 2017-09-25 DIAGNOSIS — F329 Major depressive disorder, single episode, unspecified: Secondary | ICD-10-CM | POA: Diagnosis not present

## 2017-09-25 DIAGNOSIS — F419 Anxiety disorder, unspecified: Secondary | ICD-10-CM | POA: Diagnosis not present

## 2017-09-25 NOTE — Telephone Encounter (Signed)
Copied from [place]. Topic: General - Other >> Sep 25, 2017  3:45 PM Yvette Rack wrote: Reason for CRM: Pt daughter [name] Ranson called requesting the status of the paperwork that was dropped off. [name] states she has to have the paperwork by Friday and would like a call back. Cb# [phone]

## 2017-09-26 NOTE — Telephone Encounter (Signed)
Spoke to [facility] and asked her some of the questions that were on the form. Form has been completed and given to Dr. Ronnald Ramp to sign. [name]/pt will call to sched the nurse visit for the PPD placement.

## 2017-09-26 NOTE — Telephone Encounter (Signed)
Form placed upfront to be picked up.

## 2017-09-27 ENCOUNTER — Encounter: Payer: Self-pay | Admitting: Gastroenterology

## 2017-10-01 ENCOUNTER — Encounter (HOSPITAL_COMMUNITY): Payer: Self-pay

## 2017-10-01 ENCOUNTER — Ambulatory Visit (HOSPITAL_COMMUNITY)
Admission: RE | Admit: 2017-10-01 | Discharge: 2017-10-01 | Disposition: A | Discharge status: Home / Self Care | Payer: Medicare Other | Source: Ambulatory Visit | Attending: General Surgery | Admitting: General Surgery

## 2017-10-03 ENCOUNTER — Encounter: Payer: Self-pay | Admitting: Internal Medicine

## 2017-10-03 DIAGNOSIS — Q613 Polycystic kidney, unspecified: Secondary | ICD-10-CM | POA: Diagnosis not present

## 2017-10-03 DIAGNOSIS — I129 Hypertensive chronic kidney disease with stage 1 through stage 4 chronic kidney disease, or unspecified chronic kidney disease: Secondary | ICD-10-CM | POA: Diagnosis not present

## 2017-10-03 DIAGNOSIS — N183 Chronic kidney disease, stage 3 (moderate): Secondary | ICD-10-CM | POA: Diagnosis not present

## 2017-10-03 DIAGNOSIS — E785 Hyperlipidemia, unspecified: Secondary | ICD-10-CM | POA: Diagnosis not present

## 2017-10-04 ENCOUNTER — Encounter: Payer: Self-pay | Admitting: Gastroenterology

## 2017-10-04 DIAGNOSIS — J3081 Allergic rhinitis due to animal (cat) (dog) hair and dander: Secondary | ICD-10-CM | POA: Diagnosis not present

## 2017-10-04 DIAGNOSIS — J301 Allergic rhinitis due to pollen: Secondary | ICD-10-CM | POA: Diagnosis not present

## 2017-10-04 DIAGNOSIS — J3089 Other allergic rhinitis: Secondary | ICD-10-CM | POA: Diagnosis not present

## 2017-10-09 ENCOUNTER — Other Ambulatory Visit (HOSPITAL_COMMUNITY): Payer: Self-pay | Admitting: General Surgery

## 2017-10-09 ENCOUNTER — Ambulatory Visit (HOSPITAL_COMMUNITY)
Admission: RE | Admit: 2017-10-09 | Discharge: 2017-10-09 | Disposition: A | Discharge status: Home / Self Care | Payer: Medicare Other | Source: Ambulatory Visit | Attending: General Surgery | Admitting: General Surgery

## 2017-10-09 DIAGNOSIS — K8020 Calculus of gallbladder without cholecystitis without obstruction: Secondary | ICD-10-CM

## 2017-10-09 MED ORDER — TECHNETIUM TC 99M MEBROFENIN IV KIT
5.0000 | PACK | Freq: Once | INTRAVENOUS | Status: AC | PRN
  Administered 2017-10-09: 5 via INTRAVENOUS

## 2017-10-10 ENCOUNTER — Ambulatory Visit (INDEPENDENT_AMBULATORY_CARE_PROVIDER_SITE_OTHER): Payer: Medicare Other | Admitting: Gastroenterology

## 2017-10-10 ENCOUNTER — Encounter: Payer: Self-pay | Admitting: Gastroenterology

## 2017-10-10 VITALS — BP 114/78 | HR 70 | Ht 71.0 in | Wt 157.0 lb

## 2017-10-10 DIAGNOSIS — K8020 Calculus of gallbladder without cholecystitis without obstruction: Secondary | ICD-10-CM

## 2017-10-10 DIAGNOSIS — K219 Gastro-esophageal reflux disease without esophagitis: Secondary | ICD-10-CM | POA: Diagnosis not present

## 2017-10-10 DIAGNOSIS — K5903 Drug induced constipation: Secondary | ICD-10-CM | POA: Diagnosis not present

## 2017-10-10 NOTE — Patient Instructions (Addendum)
If you are age 79 or older, your body mass index should be between 23-30. Your Body mass index is 21.9 kg/m. If this is out of the aforementioned range listed, please consider follow up with your Primary Care Provider.  If you are age 70 or younger, your body mass index should be between 19-25. Your Body mass index is 21.9 kg/m. If this is out of the aformentioned range listed, please consider follow up with your Primary Care Provider.   - Continue Linzess 290 mcg/day - Start Miralax 1 cap daily. When starting to have regular stools without straining, decrease to every other day. If still having regular stools without straining, can reduce again and introduce Colace as 100 mg 1- times/day - If not improving, call the office and we can trial a bowel prep to clear out the colon further, then start Colace 100 mg twice daily along with continued Linzess - Continue high fluid/water intake daily with at least 8 glasses of 8 oz per day - Continue high fiber diet  GETTING TO [place]. Irregular bowel habits such as constipation can lead to many problems over time.  Having one soft bowel movement a day is the most important way to prevent further problems.  The anorectal canal is designed to handle stretching and feces to safely manage our ability to get rid of solid waste (feces, poop, stool) out of our body.  BUT, hard constipated stools can act like ripping concrete bricks causing inflamed hemorrhoids, anal fissures, abdominal pain and bloating.     The goal: ONE SOFT BOWEL MOVEMENT A DAY!  To have soft, regular bowel movements:  . Drink at least 8 tall glasses of water a day.   . Take plenty of fiber.  Fiber is the undigested part of plant food that passes into the colon, acting as "nature's broom" to encourage bowel motility and movement.  Fiber can absorb and hold large amounts of water. This results in a larger, bulkier stool, which is soft and easier to pass. Work gradually over several weeks  up to 6 servings a day of fiber (25g a day even more if needed) in the form of: o Vegetables -- Root (potatoes, carrots, turnips), leafy green (lettuce, salad greens, celery, spinach), or cooked high residue (cabbage, broccoli, etc) o Fruit -- Fresh (unpeeled skin & pulp), Dried (prunes, apricots, cherries, etc ),  or stewed ( applesauce)  o Whole grain breads, pasta, etc (whole wheat)  o Bran cereals  . Bulking Agents -- This type of water-retaining fiber generally is easily obtained each day by one of the following:  o Psyllium bran -- The psyllium plant is remarkable because its ground seeds can retain so much water. This product is available as Metamucil, Konsyl, Effersyllium, Per Diem Fiber, or the less expensive generic preparation in drug and health food stores. Although labeled a laxative, it really is not a laxative.  o Methylcellulose -- This is another fiber derived from wood which also retains water. It is available as Citrucel. o Polyethylene Glycol - and "artificial" fiber commonly called Miralax or Glycolax.  It is helpful for people with gassy or bloated feelings with regular fiber o Flax Seed - a less gassy fiber than psyllium . No reading or other relaxing activity while on the toilet. If bowel movements take longer than 5 minutes, you are too constipated. . AVOID CONSTIPATION.  High fiber and water intake usually takes care of this.  Sometimes a laxative is needed to stimulate  more frequent bowel movements, but  . Laxatives are not a good long-term solution as it can wear the colon out. o Osmotics (Milk of Magnesia, Fleets phosphosoda, Magnesium citrate, MiraLax, GoLytely) are safer than  o Stimulants (Senokot, Castor Oil, Dulcolax, Ex Lax)    o Do not take laxatives for more than 7 days in a row. .  IF SEVERELY CONSTIPATED, try a Bowel Retraining Program: o Do not use laxatives long term.  o Eat a diet high in roughage, such as bran cereals and leafy vegetables.  o Drink six  (6) ounces of prune or apricot juice each morning.  o Eat two (2) large servings of stewed fruit each day.  o Take one (1) heaping tablespoon of a psyllium-based bulking agent twice a day. Use sugar-free sweetener when possible to avoid excessive calories.  o Eat a normal breakfast.  o Set aside 15 minutes after breakfast to sit on the toilet, but do not strain to have a bowel movement.  o If you do not have a bowel movement by the third day, use an enema and repeat the above steps.   It was a pleasure to see you today!  Henry Adams, D.O.

## 2017-10-10 NOTE — Progress Notes (Signed)
Chief Complaint: Constipation, Discuss Colon cancer screening   Referring Provider:     [name], MD   HPI:     Henry Adams is a 77 y.o. male, previous patient of Dr. Deatra Ina last seen in 2009, presenting to the [facility] for evaluation of ongoing colon cancer screening as well as chronic constipation. His last colonoscopy was in September 2009 and notable for sigmoid diverticulosis with an otherwise normal-appearing colon with recognition to repeat in 10 years for ongoing colon cancer screening.  Today he states he would like to hold off on any repeat colonoscopies unless absolutely necessary.  Otherwise he and his daughter would like to discuss his increasing constipation.  They state that he has a long history of chronic constipation for years but has been worsening over the last 5 to 6 months, requiring medications.  He endorses associated abdominal distention and discomfort along with reduced p.o. Intake/tolerance.  Has increased PO fluids, started Colace, and now Linzess for the last 2-3 months, which was increased from 145 mcg to 290 mcg in late August. Has small stool every 2-3 days. Still with straining to have BM.  No hematochezia or melena.  Has lost a couple pounds due to decreased p.o. intake.  No nausea, vomiting.  Of note, he has been following with Dr.Tat [facility]) since January 2019 for newly diagnosed Parkinsonism and started on Sinemet in January, with plan to slowly uptitrate over 4 weeks.  He states it took him a number of months to finally get up to the 3 times daily dosing (just unintentionally forgetting doses), but in the last 5 months or so has been taking TID.  Added carbidopa/levodopa CR nightly in February 2019  Recent gallstones noted on [place] and seen by General Surgery.  HIDA completed yesterday and notable for normal EF of 74% and prompt uptake and biliary excretion.  He is planning on following up with the surgeon to  discuss possible ccy.  Recent labs notable for normal LAE's and ALP with T bili 1.5 (previously 1.06 months ago) and creatinine 1.82.  Normal CBC and TSH ordered by PCM but not yet collected.  Endoscopic history: -Colonoscopy 09/2007: Sigmoid diverticulosis, otherwise normal colon.  Repeat in 10 years -Colonoscopy 06/2001: Sigmoid diverticulosis, otherwise normal colon.   Past Medical History:  Diagnosis Date  . Allergy    perennial  . Anxiety   . Arthritis   . Cholelithiasis   . CKD (chronic kidney disease)   . Depression   . Diverticulosis   . Enlarged prostate   . GERD (gastroesophageal reflux disease)   . HLD (hyperlipidemia)   . Hx of cardiovascular stress test    ETT-Myoview 6/14: probable low risk study, inf defect with normal inf wall motion-cannot r/o ischemia; prominent gut uptake adjacent to inf wall on rest images-poss defect is atten, EF 63%  . Hypertension   . Melanoma ([place])   . Polycystic kidney disease      Past Surgical History:  Procedure Laterality Date  . COLONOSCOPY  2011   diverticulosis  . HERNIA REPAIR     umbilical  . KNEE SURGERY Left   . medialization laryngoplasty  10/15/2013   Dr Joya Gaskins, [facility]  . TONSILLECTOMY AND ADENOIDECTOMY     Family History  Problem Relation Age of Onset  . Hypertension Father   . Cancer Sister        breast  . Heart disease  Brother        heart transplant  . Cancer Daughter        breast  . Stroke Sister        >1  . Skin cancer Brother        squamous cell   Social History   Tobacco Use  . Smoking status: Never Smoker  . Smokeless tobacco: Never Used  Substance Use Topics  . Alcohol use: Yes    Alcohol/week: 0.0 standard drinks    Comment:  2-3 beers / month  . Drug use: No   Current Outpatient Medications  Medication Sig Dispense Refill  . buPROPion (WELLBUTRIN XL) 150 MG 24 hr tablet Take 1 tablet (150 mg total) by mouth daily. 30 tablet 5  . carbidopa-levodopa (SINEMET IR) 25-100 MG tablet Take  1 tablet by mouth 4 (four) times daily. 120 tablet 5  . Carbidopa-Levodopa ER (SINEMET CR) 25-100 MG tablet controlled release Take 1 tablet by mouth 2 (two) times daily. (Patient taking differently: Take 1 tablet by mouth daily. ) 90 tablet 1  . fexofenadine (ALLEGRA) 180 MG tablet Take 180 mg by mouth daily.      . hydrochlorothiazide (MICROZIDE) 12.5 MG capsule Take 12.5 mg by mouth daily.    [name] linaclotide (LINZESS) 290 MCG CAPS capsule Take 1 capsule (290 mcg total) by mouth daily before breakfast. 90 capsule 1  . omeprazole (PRILOSEC) 40 MG capsule TAKE (1) CAPSULE DAILY. 90 capsule 0  . telmisartan (MICARDIS) 40 MG tablet Take 20 mg by mouth daily.     . traZODone (DESYREL) 50 MG tablet TAKE 1 OR 2 TABLETS AT BEDTIME AS NEEDED FOR SLEEP. 90 tablet 1  . vitamin B-12 (CYANOCOBALAMIN) 1000 MCG tablet Take 1,000 mcg by mouth daily.     No current facility-administered medications for this visit.    Allergies  Allergen Reactions  . Iodine     rash  . Diazepam     Mental status changes   . Oxycodone     Mental status changes & nausea  . Pollen Extract Itching  . Tamsulosin     Other reaction(s): Other (See Comments) Unknown   . Doxazosin Rash    agitation  . Etodolac Rash    unknown  . Tape Rash    Certain "Band-Aids"     Review of Systems: All systems reviewed and negative except where noted in HPI.     Physical Exam:    Wt Readings from Last 3 Encounters:  10/10/17 157 lb (71.2 kg)  09/18/17 158 lb 4 oz (71.8 kg)  08/27/17 154 lb (69.9 kg)    Ht 5\' 11"  (1.803 m)   Wt 157 lb (71.2 kg)   BMI 21.90 kg/m  Constitutional:  Pleasant, in no acute distress. Psychiatric: Normal mood and affect. Behavior is normal. EENT: Pupils normal.  Conjunctivae are normal. No scleral icterus. Neck supple. No cervical LAD. Cardiovascular: Normal rate, regular rhythm. No edema Pulmonary/chest: Effort normal and breath sounds normal. No wheezing, rales or rhonchi. Abdominal: Soft,  nondistended, nontender. Bowel sounds active throughout. There are no masses palpable. No hepatomegaly. Neurological: Alert and oriented to person place and time. Skin: Skin is warm and dry. No rashes noted.   ASSESSMENT AND PLAN;   Henry Adams is a 77 y.o. male presenting with:  1) Constipation: Suspect exacerbation of underlying mild constipation since initiation of Sinemet earlier this year which carries approximately 6% ADR of constipation.  As this medication is essential, we will  plan on treating as below:  - Continue Linzess 290 mcg/day - Start Miralax 1 cap daily. When starting to have regular stools without straining, decrease to every other day.  - If still having regular stools without straining, can reduce MiraLAX again and introduce Colace as 100 mg 1-2 times/day - If not improving, call the office and we can trial a bowel prep to clear out the colon further, then start Colace 100 mg twice daily along with continued Linzess to promote ongoing soft stools without straining to have BM - Continue high fluid/water intake daily with at least 8 glasses of 8 oz per day - Continue high fiber diet -Provided with handout regarding constipation - If no improvement with the above intervention, can consider colonoscopy to rule out intraluminal etiology  2) colon cancer screening: Last colonoscopy 10 years ago without any polyps.  Patient does not want to proceed with repeat colonoscopies for colon cancer screening.  Given age and comorbidities, reasonable to stop colonoscopy for routine colon cancer screening.  Would only repeat colonoscopy for GI symptoms as noted above.  3) GERD: Well-controlled with Prilosec.  No change in medications at this time.  4) Cholelithiasis: Recent ultrasound 2 cm stone with otherwise normal HIDA.  To follow-up with general surgeon to discuss risk/benefit profile of elective cholecystectomy.  Mildly elevated T bili with otherwise normal liver enzymes and no  radiographic or clinical evidence of cholangitis/cholecystitis.  Suspect related to large gallstone and ongoing eval pending surgical follow-up.   Return to clinic in 2 to 3 months or sooner as needed Please see patient instructions for additional details regarding treatment plan and recommendations.   Lavena Bullion, DO, FACG  10/10/2017, 9:21 AM   [name], MD

## 2017-10-11 ENCOUNTER — Other Ambulatory Visit: Payer: Self-pay | Admitting: Internal Medicine

## 2017-10-22 DIAGNOSIS — J3089 Other allergic rhinitis: Secondary | ICD-10-CM | POA: Diagnosis not present

## 2017-10-22 DIAGNOSIS — J301 Allergic rhinitis due to pollen: Secondary | ICD-10-CM | POA: Diagnosis not present

## 2017-10-22 DIAGNOSIS — J3081 Allergic rhinitis due to animal (cat) (dog) hair and dander: Secondary | ICD-10-CM | POA: Diagnosis not present

## 2017-10-24 DIAGNOSIS — K8020 Calculus of gallbladder without cholecystitis without obstruction: Secondary | ICD-10-CM | POA: Diagnosis not present

## 2017-10-24 DIAGNOSIS — G20 Parkinson's disease: Secondary | ICD-10-CM | POA: Diagnosis not present

## 2017-10-24 DIAGNOSIS — K5900 Constipation, unspecified: Secondary | ICD-10-CM | POA: Diagnosis not present

## 2017-10-24 DIAGNOSIS — F419 Anxiety disorder, unspecified: Secondary | ICD-10-CM | POA: Diagnosis not present

## 2017-10-30 ENCOUNTER — Encounter: Payer: Self-pay | Admitting: Internal Medicine

## 2017-10-30 ENCOUNTER — Other Ambulatory Visit (INDEPENDENT_AMBULATORY_CARE_PROVIDER_SITE_OTHER): Payer: Medicare Other

## 2017-10-30 ENCOUNTER — Ambulatory Visit: Payer: Medicare Other | Admitting: Internal Medicine

## 2017-10-30 VITALS — BP 114/78 | HR 82 | Temp 97.8°F | Resp 16 | Ht 71.0 in | Wt 153.0 lb

## 2017-10-30 DIAGNOSIS — R350 Frequency of micturition: Secondary | ICD-10-CM | POA: Diagnosis not present

## 2017-10-30 LAB — URINALYSIS, ROUTINE W REFLEX MICROSCOPIC
Bilirubin Urine: NEGATIVE
HGB URINE DIPSTICK: NEGATIVE
Ketones, ur: NEGATIVE
LEUKOCYTES UA: NEGATIVE
Nitrite: NEGATIVE
RBC / HPF: NONE SEEN (ref 0–?)
Specific Gravity, Urine: 1.01 (ref 1.000–1.030)
Total Protein, Urine: NEGATIVE
URINE GLUCOSE: NEGATIVE
Urobilinogen, UA: 0.2 (ref 0.0–1.0)
pH: 6.5 (ref 5.0–8.0)

## 2017-10-30 MED ORDER — FINASTERIDE 5 MG PO TABS: 5.0000 mg | ORAL_TABLET | Freq: Every day | ORAL | 5 refills | Status: DC

## 2017-10-30 NOTE — Assessment & Plan Note (Signed)
Has had urinary frequency for a while, but it has gotten worse Associated with urinary urgency and weak stream He likely has an element of BPH with possible obstruction, but also need to rule out a UTI Urinalysis, urine culture It looks like he did have side effects to the doxazosin and possibly Flomax Proscar looks like it was prescribed-?  Not sure if he actually took it We will see if there is an infection, but likely needs BPH medication-may need to retry Proscar Needs to follow-up if there is no improvement and may need to see urology

## 2017-10-30 NOTE — Patient Instructions (Addendum)
Give a urine sample in the lab -   Tests ordered today. Your results will be released to [place] (or called to you) after review, usually within 72hours after test completion. If any changes need to be made, you will be notified at that same time.   Medications reviewed and updated.  Changes include :   none

## 2017-10-30 NOTE — Progress Notes (Signed)
Subjective:    Patient ID: Henry Adams, male    DOB: 03-28-1940, 77 y.o.   MRN: [id number]  HPI The patient is here for an acute visit.  Urinating problems;  He fist noticed it several month ago.  It has gotten worse.  Last night he was up every 45- 50 min and he has that during the day as well.  He does state urinary urgency and a very weak stream.  He denies any burning with urination, blood in the urine or discoloration.  His urine actually looks very clear.  He denies fevers or chills.  He does have chronic abdominal issues that he is working on with his primary care physician.  He has abdominal pain related to constipation and nausea.  He has abdominal distention and had a recent ultrasound of his abdomen.  He does have a history of BPH.  And his allergy list is Flomax and doxazosin.  The doxazosin apparently caused agitation.  There is no side effect listed for the Flomax.  Proscar was also on his list a few years ago.  He does not recall taking 3 medications, but only 2.  He states the second medication he never actually took.  He is unsure what the names are.  Medications and allergies reviewed with patient and updated if appropriate.  Patient Active Problem List   Diagnosis Date Noted  . Calculus of gallbladder with chronic cholecystitis without obstruction 09/18/2017  . Chronic idiopathic constipation 08/19/2017  . Age-related cognitive decline 01/09/2017  . Anxiety and depression 10/24/2016  . Insomnia 10/24/2016  . BPH associated with nocturia 08/11/2015  . Routine general medical examination at a health care facility 08/11/2015  . Coarse tremor 04/04/2015  . Vocal cord dysfunction 11/04/2013  . Vocal cord atrophy 09/01/2013  . Essential hypertension 04/26/2007  . Allergic rhinitis 04/26/2007  . DEGENERATIVE JOINT DISEASE 04/26/2007  . G E R D 08/05/2006  . Helicobacter pylori infection 05/30/2006  . Hyperlipidemia with target LDL less than 160 05/30/2006  . Polycystic  kidney 05/30/2006    Current Outpatient Medications on File Prior to Visit  Medication Sig Dispense Refill  . buPROPion (WELLBUTRIN XL) 150 MG 24 hr tablet Take 1 tablet (150 mg total) by mouth daily. 30 tablet 5  . carbidopa-levodopa (SINEMET IR) 25-100 MG tablet Take 1 tablet by mouth 4 (four) times daily. 120 tablet 5  . Carbidopa-Levodopa ER (SINEMET CR) 25-100 MG tablet controlled release Take 1 tablet by mouth 2 (two) times daily. (Patient taking differently: Take 1 tablet by mouth daily. ) 90 tablet 1  . fexofenadine (ALLEGRA) 180 MG tablet Take 180 mg by mouth daily.      . hydrochlorothiazide (MICROZIDE) 12.5 MG capsule Take 12.5 mg by mouth daily.    [name] linaclotide (LINZESS) 290 MCG CAPS capsule Take 1 capsule (290 mcg total) by mouth daily before breakfast. 90 capsule 1  . omeprazole (PRILOSEC) 40 MG capsule TAKE (1) CAPSULE DAILY. 90 capsule 0  . telmisartan (MICARDIS) 40 MG tablet Take 20 mg by mouth daily.     . traZODone (DESYREL) 50 MG tablet TAKE 1 OR 2 TABLETS AT BEDTIME AS NEEDED FOR SLEEP. 90 tablet 1  . vitamin B-12 (CYANOCOBALAMIN) 1000 MCG tablet Take 1,000 mcg by mouth daily.     No current facility-administered medications on file prior to visit.     Past Medical History:  Diagnosis Date  . Allergy    perennial  . Anxiety   . Arthritis   .  Cholelithiasis   . CKD (chronic kidney disease)   . Depression   . Diverticulosis   . Enlarged prostate   . Gallstones   . GERD (gastroesophageal reflux disease)   . HLD (hyperlipidemia)   . Hx of cardiovascular stress test    ETT-Myoview 6/14: probable low risk study, inf defect with normal inf wall motion-cannot r/o ischemia; prominent gut uptake adjacent to inf wall on rest images-poss defect is atten, EF 63%  . Hypertension   . Melanoma ([place])   . Polycystic kidney disease     Past Surgical History:  Procedure Laterality Date  . COLONOSCOPY  2011   diverticulosis  . HERNIA REPAIR     umbilical  . KNEE SURGERY  Left   . medialization laryngoplasty  10/15/2013   Dr Joya Gaskins, [facility]  . TONSILLECTOMY AND ADENOIDECTOMY      Social History   Socioeconomic History  . Marital status: Married    Spouse name: Not on file  . Number of children: 2  . Years of education: Not on file  . Highest education level: Not on file  Occupational History  . 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 Financial resource strain: Not on file  . Food insecurity:    Worry: Not on file    Inability: Not on file  . Transportation needs:    Medical: Not on file    Non-medical: Not on file  Tobacco Use  . Smoking status: Never Smoker  . Smokeless tobacco: Never Used  Substance and Sexual Activity  . Alcohol use: Yes    Alcohol/week: 0.0 standard drinks    Comment:  2-3 beers / month  . Drug use: No  . Sexual activity: Not on file  Lifestyle  . Physical activity:    Days per week: Not on file    Minutes per session: Not on file  . Stress: Not on file  Relationships  . Social connections:    Talks on phone: Not on file    Gets together: Not on file    Attends religious service: Not on file    Active member of club or organization: Not on file    Attends meetings of clubs or organizations: Not on file    Relationship status: Not on file  Other Topics Concern  . Not on file  Social History Narrative  . Not on file    Family History  Problem Relation Age of Onset  . Hypertension Father   . Cancer Brother        mouth  . Cancer Sister        breast  . Heart disease Brother        heart transplant  . Cancer Daughter        breast  . Stroke Sister        >1  . Skin cancer Brother        squamous cell  . Colon cancer Neg Hx     Review of Systems  Constitutional: Negative for chills and fever.  Gastrointestinal: Positive for abdominal pain (from constipation), constipation and nausea (not new).  Genitourinary: Positive for difficulty urinating (weak  stream), frequency (small amount) and urgency. Negative for dysuria and hematuria.  Musculoskeletal: Negative for back pain.       Objective:   Vitals:   10/30/17 1033  BP: 114/78  Pulse: 82  Resp: 16  Temp: 97.8 F (36.6 C)  SpO2: 95%  BP Readings from Last 3 Encounters:  10/30/17 114/78  10/10/17 114/78  09/18/17 118/78   Wt Readings from Last 3 Encounters:  10/30/17 153 lb (69.4 kg)  10/10/17 157 lb (71.2 kg)  09/18/17 158 lb 4 oz (71.8 kg)   Body mass index is 21.34 kg/m.   Physical Exam  Constitutional: He appears well-developed and well-nourished. No distress.  HENT:  Head: Normocephalic and atraumatic.  Abdominal: Soft. He exhibits distension (Significant abdominal distention). He exhibits no mass. There is tenderness (Mild diffuse ). There is no rebound and no guarding. No hernia.  Genitourinary:  Genitourinary Comments: No CVA tenderness bilaterally  Skin: Skin is warm and dry. He is not diaphoretic.           Assessment & Plan:    See Problem List for Assessment and Plan of chronic medical problems.

## 2017-10-31 ENCOUNTER — Encounter: Payer: Self-pay | Admitting: Internal Medicine

## 2017-10-31 LAB — URINE CULTURE
MICRO NUMBER:: 91214639
RESULT: NO GROWTH
SPECIMEN QUALITY:: ADEQUATE

## 2017-11-01 ENCOUNTER — Encounter: Payer: Self-pay | Admitting: Internal Medicine

## 2017-11-01 DIAGNOSIS — N138 Other obstructive and reflux uropathy: Secondary | ICD-10-CM

## 2017-11-01 DIAGNOSIS — N401 Enlarged prostate with lower urinary tract symptoms: Principal | ICD-10-CM

## 2017-11-04 ENCOUNTER — Telehealth: Payer: Self-pay | Admitting: Internal Medicine

## 2017-11-04 NOTE — Telephone Encounter (Signed)
Copied from [place] [phone]. Topic: Quick Communication - See Telephone Encounter >> Nov 04, 2017 11:08 AM Blase Mess A wrote: CRM for notification. See Telephone encounter for: 11/04/17.  Patient is calling taking traZODone (DESYREL) 50 MG tablet [[id number] & Carbidopa-Levodopa ER (SINEMET CR) 25-100 MG tablet controlled release [[id number]]   He is unable to sleep on the current meds.  "It is driving him crazy."  Patient is  restless and his keeps moving and walking around. He has stomach issues and nerve issues.  To the point that he gets little sleep.  Patient is requesting Dr. Ronnald Ramp advise.   [phone]

## 2017-11-04 NOTE — Progress Notes (Signed)
Henry Adams was seen today in the movement disorders clinic for neurologic consultation at the request of [name], MD.    Prior records made available to me were reviewed.  The patient first presented to [facility] in August, 2015 with complaints of a weak voice.    States that his voice was getting soft for a few years prior to that.   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   It helped for 4-5 months.   He recently followed up with his ENT physician at [facility] on 03/23/2015 complaining of continued weak voice, but they felt that his voice was fairly strong and did not see any pathology.  He was to follow-up with the voice lab.   The voice therapy didn't seem to help.   He did follow up with his primary care physician and was complaining about some mild tremor and was referred here to rule out Parkinson's disease.  Pt states that it is when he holds something proximally that he notes tremor bilaterally.  He also notes difficulty with handwriting; it is small and "squiggly."   02/01/17 update: Patient is seen today in follow-up, although I have not seen him in 2 years.  He is here to discuss memory change per referral but comes with a list of many other issues.  This patient is accompanied in the office by his daughter who supplements the history.    I have reviewed numerous records made available to me.  Pt reports he has been concerned about memory for about 10 months, although he complained about this concern to me about 2 years ago although there was no evidence of dementia then.  He was placed on Lexapro in October for concerns of anxiety and depression, which could potentially affect memory.  Pt thinks that was helpful but then later says that anxiety and depression are his biggest issue.  Living situation:  Pt lives with their spouse.  However, the patient's wife has Alzheimer dementia and the  patient is responsible for her care.   They live independently in retirement living.   The patient does do the finances in the home. Pt reports that he can do these but he has low initiative/motivation to do that. He isn't really sure that he has a memory problem but a motivation/energy problem.   He hasn't missed paying any bills.  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  He notes trouble with his cursive writing as well.   The patient does drive.  He has had no problem with that.  He programs his GPS himself and goes.   There have no been any motor vehicle accidents in the recent years.  The patient does not cook.  He states that they provide meals at retirement living .  He works out at [employer] at retirement living.  ADL's:  The patient is able to perform his own ADL's. The patient self medicates.  The patients bladder and bowel are under good control.   Behavior:  There have been no behavioral changes over the years.  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  Wifes personality has also changed with AD; becomes  agitated and paranoid.  Pt taking all the "brunt" of that per daughter.    03/12/17 update: Patient was seen today as a work in, at his request.  This patient is accompanied in the office by his daughter who supplements the history.Last visit, he was complaining about memory change and he was scheduled for neurocognitive testing.  This has not yet been completed, due to the fact that there is a lengthy wait list for this and the fact that he requested an early work in appointment.  Lab work was done for peripheral neuropathy as well as for myasthenia, and all of this was unremarkable.  We did start him on levodopa last visit and was to work up to carbidopa/levodopa 25/100 over a 4 week time span.   He is taking it 8am/noon/midnight.  Stays up to take at midnight so can spread evenly q 8 hours.   "I have made some progress and there are some days that I sleep all night."  Biggest c/o is restlessness and leg pain at night.  He has trouble in the AM for about 2-3 hours.  The trouble he describes are aches.  "so much of the trouble centers around neuropathy."  Daughter states that big contributor is they have had some bad times in taking care of wife and note that stress increases physical symptoms.  On lexapro 20 mg - been on that for about 2 months per patient.  He isn't sure that it is helping.  Daughter not sure.  Would like something else  07/01/17 update: Patient is seen today for follow-up for parkinsonism.  He was told to take carbidopa/levodopa 25/100 at 7 AM/11 AM/3 PM/7 PM and added carbidopa/levodopa 25/100 CR at bedtime to see if that would help restless leg syndrome.   He has had no falls but he did have his knee "give out" and had a near fall and went to PT and is better.  "I'm doing so much better."   The records that were made available to me were reviewed.  he saw Dr. Si Raider for neuropscyhometric testing on 05/23/17 and subsequently had a feedback session with him where results and recommendations were given to him.  These are detailed within the chart.  Pt did not have any evidence of dementia.  Pt dx with adjustment d/o with depressed and anxious mood.   Recommended respite care for him.  He was on lexapro and last visit we discussed remeron but told him to f/u with PCP.  He did see Dr. Sharlet Salina and he was told that he should f/u with [name], MD as the medication wouldn't help his "leg wandering sensation.  Ultimately, he was given a RX for trazdone by Dr. Sharlet Salina.  He states that it is helping.  Once per month, he may have an "irritable day" but otherwise doing well.    This has helped the RLS.  Still caregiving for wife who has AD.  He states that "she is living in a different  world."  Daughters are helping some but unsure if able to do on the longterm.  He is on lexapro and not sure if working as depression not as bad as it was but has it.    11/05/17 update: Patient is seen today in follow-up for parkinsonism.  He is on carbidopa/levodopa 25/100, 1 tablet at 7 AM/11 AM/3 PM/7 PM and carbidopa/levodopa 25/100 cr at bedtime.  He finds that by 9pm, he is restless and has trouble relaxing.   No  falls since last visit.  No hallucinations.  Records are reviewed since our last visit.  He was in the emergency room at the end of July with complaints of dizziness and abdominal pain.  Was given labetalol in the emergency room for high blood pressure.  Abdominal pain had resolved before getting to the ER.  Patient called the answering service the day after that ER visit and reported that he thought the symptoms were from starting Wellbutrin, but the emergency third room thought it was from abruptly stopping trazodone.  He was told to decrease the Wellbutrin from 300 mg to 150 mg.  Dr. Alain Marion placed him back on low-dose trazodone.  He isn't sleeping well.  His mood has been up and down.  They have moved to independent living at [place] and his wife is having a tough time dealing with that.  She takes it out on the patient.  He was also treated for constipation with Linzess.  Patient did not find that helpful.  He subsequently had an ultrasound which demonstrated a 2 cm gallstone with a thickened gallbladder wall.  He was referred to general surgery.  I have no notes about that and he states that he saw the surgeon and was told that they will just watch it.  He did see gastroenterology for constipation and he was told to continue Linzess, start MiraLAX daily as well as Colace.  Daughter states that it is better but not completely resolved   PREVIOUS MEDICATIONS: given RX for aricept on 01/09/17 but pt reported vivid scary dreams with it after 3 days and he d/c it.  He started on namenda      ALLERGIES:   Allergies  Allergen Reactions  . Iodine     rash  . Diazepam     Mental status changes   . Oxycodone     Mental status changes & nausea  . Pollen Extract Itching  . Tamsulosin     Other reaction(s): Other (See Comments) Unknown   . Doxazosin Rash    agitation  . Etodolac Rash    unknown  . Tape Rash    Certain "Band-Aids"    CURRENT MEDICATIONS:  Outpatient Encounter Medications as of 11/05/2017  Medication Sig  . buPROPion (WELLBUTRIN XL) 150 MG 24 hr tablet Take 1 tablet (150 mg total) by mouth daily.  . carbidopa-levodopa (SINEMET IR) 25-100 MG tablet Take 1 tablet by mouth 4 (four) times daily.  . Carbidopa-Levodopa ER (SINEMET CR) 25-100 MG tablet controlled release Take 1 tablet by mouth 2 (two) times daily. (Patient taking differently: Take 1 tablet by mouth daily. )  . fexofenadine (ALLEGRA) 180 MG tablet Take 180 mg by mouth daily.    . hydrochlorothiazide (MICROZIDE) 12.5 MG capsule Take 12.5 mg by mouth daily.  [name] linaclotide (LINZESS) 290 MCG CAPS capsule Take 1 capsule (290 mcg total) by mouth daily before breakfast.  . omeprazole (PRILOSEC) 40 MG capsule TAKE (1) CAPSULE DAILY.  [name] telmisartan (MICARDIS) 40 MG tablet Take 20 mg by mouth daily. Take 1/2 tablet (10 mg) daily.  . traZODone (DESYREL) 50 MG tablet TAKE 1 OR 2 TABLETS AT BEDTIME AS NEEDED FOR SLEEP.  [name] vitamin B-12 (CYANOCOBALAMIN) 1000 MCG tablet Take 1,000 mcg by mouth daily.  . finasteride (PROSCAR) 5 MG tablet Take 1 tablet (5 mg total) by mouth daily. (Patient not taking: Reported on 11/05/2017)   No facility-administered encounter medications on file as of 11/05/2017.     PAST MEDICAL HISTORY:  Past Medical History:  Diagnosis Date  . Allergy    perennial  . Anxiety   . Arthritis   . Cholelithiasis   . CKD (chronic kidney disease)   . Depression   . Diverticulosis   . Enlarged prostate   . Gallstones   . GERD (gastroesophageal reflux disease)   . HLD  (hyperlipidemia)   . Hx of cardiovascular stress test    ETT-Myoview 6/14: probable low risk study, inf defect with normal inf wall motion-cannot r/o ischemia; prominent gut uptake adjacent to inf wall on rest images-poss defect is atten, EF 63%  . Hypertension   . Melanoma ([place])   . Polycystic kidney disease     PAST SURGICAL HISTORY:   Past Surgical History:  Procedure Laterality Date  . COLONOSCOPY  2011   diverticulosis  . HERNIA REPAIR     umbilical  . KNEE SURGERY Left   . medialization laryngoplasty  10/15/2013   Dr Joya Gaskins, [facility]  . TONSILLECTOMY AND ADENOIDECTOMY      SOCIAL HISTORY:   Social History   Socioeconomic History  . Marital status: Married    Spouse name: Not on file  . Number of children: 2  . Years of education: Not on file  . Highest education level: Not on file  Occupational History  . 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 Financial resource strain: Not on file  . Food insecurity:    Worry: Not on file    Inability: Not on file  . Transportation needs:    Medical: Not on file    Non-medical: Not on file  Tobacco Use  . Smoking status: Never Smoker  . Smokeless tobacco: Never Used  Substance and Sexual Activity  . Alcohol use: Yes    Alcohol/week: 0.0 standard drinks    Comment:  2-3 beers / month  . Drug use: No  . Sexual activity: Not on file  Lifestyle  . Physical activity:    Days per week: Not on file    Minutes per session: Not on file  . Stress: Not on file  Relationships  . Social connections:    Talks on phone: Not on file    Gets together: Not on file    Attends religious service: Not on file    Active member of club or organization: Not on file    Attends meetings of clubs or organizations: Not on file    Relationship status: Not on file  . Intimate partner violence:    Fear of current or ex partner: Not on file    Emotionally abused: Not on file    Physically abused: Not  on file    Forced sexual activity: Not on file  Other Topics Concern  . Not on file  Social History Narrative  . Not on file    FAMILY HISTORY:   Family Status  Relation Name Status  . Mother  Deceased       heart disease, dementia  . Father  Deceased       HTN, ulcers  . Sister  Deceased       breast cancer  . Brother Lavone Neri Deceased       cancer tongue/cheek, heart disease  . Sister  Deceased       stroke, GERD  . Brother  Alive       DJD  . Child  Alive       2 daughters, breast CA  hx  . Sister Dorthory (Not Specified)  . Brother  (Not Specified)  . Daughter  (Not Specified)  . Sister  (Not Specified)  . Brother  (Not Specified)  . Neg Hx  (Not Specified)    ROS:   Review of Systems  Constitutional: Positive for malaise/fatigue.  HENT: Negative.   Eyes: Negative.   Respiratory: Negative.   Cardiovascular: Negative.   Gastrointestinal: Positive for abdominal pain.  Skin: Negative.   Neurological: Positive for tingling (legs/feet).    PHYSICAL EXAMINATION:    VITALS:   Vitals:   11/05/17 1315  BP: 108/80  Pulse: 76  SpO2: 97%  Weight: 155 lb 2 oz (70.4 kg)  Height: 5\' 11"  (1.803 m)     GEN:  The patient appears stated age and is in NAD. HEENT:  Normocephalic, atraumatic.  The mucous membranes are moist. The superficial temporal arteries are without ropiness or tenderness.  There are no tongue fasciculations. CV:  RRR Lungs:  CTAB Neck/HEME:  There are no carotid bruits bilaterally.  Neurological examination:  Orientation: Patient is alert and oriented x3.   Montreal Cognitive Assessment  02/04/2017  Visuospatial/ Executive (0/5) 3  Naming (0/3) 1  Attention: Read list of digits (0/2) 2  Attention: Read list of letters (0/1) 1  Attention: Serial 7 subtraction starting at 100 (0/3) 1  Language: Repeat phrase (0/2) 2  Language : Fluency (0/1) 0  Abstraction (0/2) 2  Delayed Recall (0/5) 2  Orientation (0/6) 6  Total 20  Adjusted Score (based  on education) 20    Cranial nerves: There is good facial symmetry.  There is facial hypomimia.  The speech is fluent and clear. Soft palate rises symmetrically and there is no tongue deviation. Hearing is intact to conversational tone. Sensation: Sensation is intact to light touch throughout Motor: Strength is 5/5 in the bilateral upper and lower extremities.   Shoulder shrug is equal and symmetric.  There is no pronator drift.  Movement examination: Tone: There is normal tone in the bilateral upper extremities. Abnormal movements: None Coordination:  There is decremation with RAM's, only with heel and toe taps on the left. Gait and Station: The patient has no difficulty arising out of a deep-seated chair without the use of the hands. The patient's stride length is good today with just slight decrease in arm swing.   Lab Results  Component Value Date   TSH 1.61 11/14/2016   Lab Results  Component Value Date   VITAMINB12 361 11/14/2016      Chemistry      Component Value Date/Time   NA 138 08/17/2017 1008   K 3.9 08/17/2017 1008   CL 100 08/17/2017 1008   CO2 24 08/17/2017 1008   BUN 16 08/17/2017 1008   CREATININE 1.82 (H) 08/17/2017 1008      Component Value Date/Time   CALCIUM 9.2 08/17/2017 1008   ALKPHOS 67 08/17/2017 1008   AST 22 08/17/2017 1008   ALT 9 08/17/2017 1008   BILITOT 1.5 (H) 08/17/2017 1008          ASSESSMENT/PLAN:  1.  Memory loss  -He had neurocognitive testing in 05/2017 and no evidence of dementia.  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  2.  Peripheral neuropathy  -The patient has clinical examination evidence of a diffuse peripheral neuropathy, which certainly can affect gait and balance.  We discussed safety associated with peripheral neuropathy.  We discussed balance therapy and the importance of ambulatory assistive device  for balance assistance.  -on b12 supplement  -I do not want to add any more medication right  now.  We can consider that in the future as he is having paresthesias in the lower extremities.  3.  Parkinsonism   -He will continue carbidopa/levodopa 25/100, 1 tablet at 7 AM/11 AM/3 PM/ and increase 7 PM to 1.5 tablets.  Hopefully, this will help some of the restlessness in the legs in the evening and night  -change bedtime carbidopa/levodopa 50/100 CR at bedtime.   4.  Depression  -l was going to raise his Wellbutrin XL back up to 300 mg daily, but we decided to not to make 2 changes at once.  He is not suicidal or homicidal.  The biggest issue is caregiver burden, as he is taking care of his wife with Alzheimer's disease.  They were given resources in the community for respite care, including [facility].  5. Follow up is anticipated in the next few months, sooner should new neurologic issues arise.  Much greater than 50% of this visit was spent in counseling and coordinating care.  Total face to face time:  25 min

## 2017-11-05 ENCOUNTER — Ambulatory Visit: Payer: Medicare Other | Admitting: Neurology

## 2017-11-05 ENCOUNTER — Encounter: Payer: Self-pay | Admitting: Neurology

## 2017-11-05 VITALS — BP 108/80 | HR 76 | Ht 71.0 in | Wt 155.1 lb

## 2017-11-05 DIAGNOSIS — F330 Major depressive disorder, recurrent, mild: Secondary | ICD-10-CM | POA: Diagnosis not present

## 2017-11-05 DIAGNOSIS — G2581 Restless legs syndrome: Secondary | ICD-10-CM

## 2017-11-05 DIAGNOSIS — G20 Parkinson's disease: Secondary | ICD-10-CM

## 2017-11-05 MED ORDER — CARBIDOPA-LEVODOPA ER 50-200 MG PO TBCR: 1.0000 | EXTENDED_RELEASE_TABLET | Freq: Every day | ORAL | 1 refills | Status: DC

## 2017-11-05 NOTE — Patient Instructions (Addendum)
-  continue carbidopa/levodopa 25/100, 1 tablet at 7 AM/11 AM/3 PM/ and increase 7 PM dosage to 1.5 tablets.   -increase bedtime carbidopa/levodopa 50/200 CR 30 min before bedtime

## 2017-11-05 NOTE — Telephone Encounter (Signed)
Can we get pt in for an appt to discuss sleep?

## 2017-11-06 ENCOUNTER — Ambulatory Visit: Payer: Medicare Other | Admitting: Internal Medicine

## 2017-11-06 NOTE — Telephone Encounter (Addendum)
appt made with daughter

## 2017-11-06 NOTE — Telephone Encounter (Signed)
Daughter called back and canceled appt because patient told her he did not need the appt

## 2017-11-07 DIAGNOSIS — J3081 Allergic rhinitis due to animal (cat) (dog) hair and dander: Secondary | ICD-10-CM | POA: Diagnosis not present

## 2017-11-07 DIAGNOSIS — J301 Allergic rhinitis due to pollen: Secondary | ICD-10-CM | POA: Diagnosis not present

## 2017-11-07 DIAGNOSIS — J3089 Other allergic rhinitis: Secondary | ICD-10-CM | POA: Diagnosis not present

## 2017-11-08 ENCOUNTER — Ambulatory Visit: Payer: Medicare Other | Admitting: Neurology

## 2017-11-08 ENCOUNTER — Encounter

## 2017-11-12 ENCOUNTER — Ambulatory Visit: Payer: Medicare Other | Admitting: Family

## 2017-11-12 ENCOUNTER — Ambulatory Visit: Payer: Self-pay

## 2017-11-12 ENCOUNTER — Other Ambulatory Visit (INDEPENDENT_AMBULATORY_CARE_PROVIDER_SITE_OTHER): Payer: Medicare Other

## 2017-11-12 ENCOUNTER — Encounter: Payer: Self-pay | Admitting: Family

## 2017-11-12 VITALS — BP 114/70 | HR 76 | Temp 97.6°F | Ht 71.0 in | Wt 158.0 lb

## 2017-11-12 DIAGNOSIS — R42 Dizziness and giddiness: Secondary | ICD-10-CM | POA: Diagnosis not present

## 2017-11-12 LAB — COMPREHENSIVE METABOLIC PANEL
ALBUMIN: 3.9 g/dL (ref 3.5–5.2)
ALK PHOS: 59 U/L (ref 39–117)
ALT: 8 U/L (ref 0–53)
AST: 14 U/L (ref 0–37)
BILIRUBIN TOTAL: 0.9 mg/dL (ref 0.2–1.2)
BUN: 22 mg/dL (ref 6–23)
CALCIUM: 8.8 mg/dL (ref 8.4–10.5)
CO2: 31 mEq/L (ref 19–32)
Chloride: 98 mEq/L (ref 96–112)
Creatinine, Ser: 1.59 mg/dL — ABNORMAL HIGH (ref 0.40–1.50)
GFR: 44.78 mL/min — ABNORMAL LOW (ref >60.00)
GLUCOSE: 85 mg/dL (ref 70–99)
Potassium: 3.5 mEq/L (ref 3.5–5.1)
Sodium: 134 mEq/L — ABNORMAL LOW (ref 135–145)
TOTAL PROTEIN: 6.9 g/dL (ref 6.0–8.3)

## 2017-11-12 LAB — CBC WITH DIFFERENTIAL/PLATELET
BASOS ABS: 0 10*3/uL (ref 0.0–0.1)
Basophils Relative: 0.9 % (ref 0.0–3.0)
Eosinophils Absolute: 0.1 10*3/uL (ref 0.0–0.7)
Eosinophils Relative: 3.2 % (ref 0.0–5.0)
HCT: 42.2 % (ref 39.0–52.0)
Hemoglobin: 14.4 g/dL (ref 13.0–17.0)
LYMPHS ABS: 1.5 10*3/uL (ref 0.7–4.0)
Lymphocytes Relative: 33.8 % (ref 12.0–46.0)
MCHC: 34.1 g/dL (ref 30.0–36.0)
MCV: 93.2 fl (ref 78.0–100.0)
Monocytes Absolute: 0.6 10*3/uL (ref 0.1–1.0)
Monocytes Relative: 14.6 % — ABNORMAL HIGH (ref 3.0–12.0)
NEUTROS PCT: 47.5 % (ref 43.0–77.0)
Neutro Abs: 2.1 10*3/uL (ref 1.4–7.7)
Platelets: 167 10*3/uL (ref 150.0–400.0)
RBC: 4.53 Mil/uL (ref 4.22–5.81)
RDW: 13.5 % (ref 11.5–15.5)
WBC: 4.4 10*3/uL (ref 4.0–10.5)

## 2017-11-12 LAB — TSH: TSH: 1.64 u[IU]/mL (ref 0.35–4.50)

## 2017-11-12 MED ORDER — MECLIZINE HCL 12.5 MG PO TABS: 12.5000 mg | ORAL_TABLET | Freq: Three times a day (TID) | ORAL | 0 refills | Status: DC | PRN

## 2017-11-12 NOTE — Progress Notes (Signed)
Henry Adams is a 77 y.o. male with the following history as recorded in Epic[employer]:  Patient Active Problem List   Diagnosis Date Noted  . Urinary frequency 10/30/2017  . Calculus of gallbladder with chronic cholecystitis without obstruction 09/18/2017  . Chronic idiopathic constipation 08/19/2017  . Age-related cognitive decline 01/09/2017  . Anxiety and depression 10/24/2016  . Insomnia 10/24/2016  . BPH associated with nocturia 08/11/2015  . Routine general medical examination at a health care facility 08/11/2015  . Coarse tremor 04/04/2015  . Vocal cord dysfunction 11/04/2013  . Vocal cord atrophy 09/01/2013  . Essential hypertension 04/26/2007  . Allergic rhinitis 04/26/2007  . DEGENERATIVE JOINT DISEASE 04/26/2007  . G E R D 08/05/2006  . Helicobacter pylori infection 05/30/2006  . Hyperlipidemia with target LDL less than 160 05/30/2006  . Polycystic kidney 05/30/2006    Current Outpatient Medications  Medication Sig Dispense Refill  . buPROPion (WELLBUTRIN XL) 150 MG 24 hr tablet Take 1 tablet (150 mg total) by mouth daily. 30 tablet 5  . carbidopa-levodopa (SINEMET CR) 50-200 MG tablet Take 1 tablet by mouth at bedtime. 90 tablet 1  . carbidopa-levodopa (SINEMET IR) 25-100 MG tablet Take 1 tablet by mouth 4 (four) times daily. 120 tablet 5  . fexofenadine (ALLEGRA) 180 MG tablet Take 180 mg by mouth daily.      . hydrochlorothiazide (MICROZIDE) 12.5 MG capsule Take 12.5 mg by mouth daily.    [name] linaclotide (LINZESS) 290 MCG CAPS capsule Take 1 capsule (290 mcg total) by mouth daily before breakfast. 90 capsule 1  . omeprazole (PRILOSEC) 40 MG capsule TAKE (1) CAPSULE DAILY. 90 capsule 0  . telmisartan (MICARDIS) 40 MG tablet Take 20 mg by mouth daily. Take 1/2 tablet (10 mg) daily.    . traZODone (DESYREL) 50 MG tablet TAKE 1 OR 2 TABLETS AT BEDTIME AS NEEDED FOR SLEEP. 90 tablet 1  . vitamin B-12 (CYANOCOBALAMIN) 1000 MCG tablet Take 1,000 mcg by mouth daily.    .  finasteride (PROSCAR) 5 MG tablet Take 1 tablet (5 mg total) by mouth daily. (Patient not taking: Reported on 11/05/2017) 30 tablet 5  . meclizine (ANTIVERT) 12.5 MG tablet Take 1 tablet (12.5 mg total) by mouth 3 (three) times daily as needed for dizziness. 30 tablet 0   No current facility-administered medications for this visit.     Allergies: Iodine; Diazepam; Oxycodone; Pollen extract; Tamsulosin; Doxazosin; Etodolac; and Tape  Past Medical History:  Diagnosis Date  . Allergy    perennial  . Anxiety   . Arthritis   . Cholelithiasis   . CKD (chronic kidney disease)   . Depression   . Diverticulosis   . Enlarged prostate   . Gallstones   . GERD (gastroesophageal reflux disease)   . HLD (hyperlipidemia)   . Hx of cardiovascular stress test    ETT-Myoview 6/14: probable low risk study, inf defect with normal inf wall motion-cannot r/o ischemia; prominent gut uptake adjacent to inf wall on rest images-poss defect is atten, EF 63%  . Hypertension   . Melanoma ([place])   . Polycystic kidney disease     Past Surgical History:  Procedure Laterality Date  . COLONOSCOPY  2011   diverticulosis  . HERNIA REPAIR     umbilical  . KNEE SURGERY Left   . medialization laryngoplasty  10/15/2013   Dr Joya Gaskins, [facility]  . TONSILLECTOMY AND ADENOIDECTOMY      Family History  Problem Relation Age of Onset  . Hypertension Father   .  Cancer Brother        mouth  . Cancer Sister        breast  . Heart disease Brother        heart transplant  . Cancer Daughter        breast  . Stroke Sister        >55  . Skin cancer Brother        squamous cell  . Colon cancer Neg Hx     Social History   Tobacco Use  . Smoking status: Never Smoker  . Smokeless tobacco: Never Used  Substance Use Topics  . Alcohol use: Yes    Alcohol/week: 0.0 standard drinks    Comment:  2-3 beers / month    Subjective:  Patient presents with concerns for symptoms of dizziness "on and off" for the past 3 days; did  see his neurologist last week- dosages of were increased/ adding new XR dosage of Carbidopa-Levodopa; "notes that he just feels off." Notes that he now able to to sleep 5-6 hours per day with medication changes made last week; He notes he spoke to his neurologist yesterday about the symptoms- was told to increase more fluids, rest; no chest pain, no shortness of breath, no blurred vision; does feel like symptoms can be worse with changing positions but does not specifically feel that the room is spinning around him. Has had vertigo in the past; was seen at ER in July 2019 with similar episode of "light-headed feeling." Thought to be medication related at that time as well.     Objective:  Vitals:   11/12/17 1330  BP: 114/70  Pulse: 76  Temp: 97.6 F (36.4 C)  TempSrc: Oral  SpO2: 97%  Weight: 158 lb 0.2 oz (71.7 kg)  Height: '5\' 11"'  (1.803 m)    General: Well developed, well nourished, in no acute distress  Skin : Warm and dry.  Head: Normocephalic and atraumatic  Eyes: Sclera and conjunctiva clear; pupils round and reactive to light; extraocular movements intact  Ears: External normal; canals clear; tympanic membranes normal  Oropharynx: Pink, supple. No suspicious lesions  Neck: Supple without thyromegaly, adenopathy  Lungs: Respirations unlabored; clear to auscultation bilaterally without wheeze, rales, rhonchi  CVS exam: normal rate and regular rhythm.  Extremities: No edema, cyanosis, clubbing  Vessels: Symmetric bilaterally  Neurologic: Alert and oriented; speech intact; face symmetrical;   Assessment:  1. Dizziness     Plan:  Update labs including CBC, CMP, TSH today; trial of Meclizine to see if it helps with symptoms; ? If symptoms are related to recent medication changes made by his neurologist last week; will most likely need to have patient follow-up there.   No follow-ups on file.  Orders Placed This Encounter  Procedures  . CBC w/Diff    Standing Status:   Future     Number of Occurrences:   1    Standing Expiration Date:   11/12/2018  . Comp Met (CMET)    Standing Status:   Future    Number of Occurrences:   1    Standing Expiration Date:   11/12/2018  . TSH    Standing Status:   Future    Number of Occurrences:   1    Standing Expiration Date:   11/12/2018    Requested Prescriptions   Signed Prescriptions Disp Refills  . meclizine (ANTIVERT) 12.5 MG tablet 30 tablet 0    Sig: Take 1 tablet (12.5 mg total) by mouth  3 (three) times daily as needed for dizziness.

## 2017-11-12 NOTE — Patient Instructions (Signed)
Please follow-up with Dr. Carles Collet if the symptoms persist.

## 2017-11-12 NOTE — Telephone Encounter (Signed)
Pt c/o moderate dizziness stating "just dizzy." Pt stated he was having episodes "on and off" since yesterday. Pt stated he increased his fluid intake and elevated his feet yesterday and felt better. Pt awoke this morning and stated that he is continuing with the lightheadedness. Pt took his BP's and are as follows: yesterday: 131/88 HR 68 and today 134/79 HR 67. Pt does not know what could be causing his dizziness. Pt stated that he had similar symptoms 1 years ago, but does not remember the details of the care or treatment.  Pt denies fever, chest pain, vomiting, diarrhea or bleeding. Care advice given and pt verbalized understanding. Pt was already scheduled for an appointment today at 1:20 pm. Advised pt to have a driver take him back and forth to the appointment.  Reason for Disposition . [1] MODERATE dizziness (e.g., interferes with normal activities) AND [2] has NOT been evaluated by physician for this  (Exception: dizziness caused by heat exposure, sudden standing, or poor fluid intake)  Answer Assessment - Initial Assessment Questions 1. DESCRIPTION: "Describe your dizziness."     "just dizzy" 2. LIGHTHEADED: "Do you feel lightheaded?" (e.g., somewhat faint, woozy, weak upon standing)     yes 3. VERTIGO: "Do you feel like either you or the room is spinning or tilting?" (i.e. vertigo)     no 4. SEVERITY: "How bad is it?"  "Do you feel like you are going to faint?" "Can you stand and walk?"   - MILD - walking normally   - MODERATE - interferes with normal activities (e.g., work, school)    - SEVERE - unable to stand, requires support to walk, feels like passing out now.      mild 5. ONSET:  "When did the dizziness begin?"     yesterday 6. AGGRAVATING FACTORS: "Does anything make it worse?" (e.g., standing, change in head position)     Standing 7. HEART RATE: "Can you tell me your heart rate?" "How many beats in 15 seconds?"  (Note: not all patients can do this)       BP: 131/88 HR 68  this am 134/79 HR 67 8. CAUSE: "What do you think is causing the dizziness?"     Pt doesn't know 9. RECURRENT SYMPTOM: "Have you had dizziness before?" If so, ask: "When was the last time?" "What happened that time?"     Yes - 1 year or more- doesn't remember the details 10. OTHER SYMPTOMS: "Do you have any other symptoms?" (e.g., fever, chest pain, vomiting, diarrhea, bleeding)      no 11. PREGNANCY: "Is there any chance you are pregnant?" "When was your last menstrual period?"       n/a  Protocols used: DIZZINESS [facility]

## 2017-11-13 ENCOUNTER — Other Ambulatory Visit: Payer: Self-pay | Admitting: Family

## 2017-11-13 ENCOUNTER — Encounter: Payer: Self-pay | Admitting: Family

## 2017-11-13 NOTE — Telephone Encounter (Signed)
-----   Message from Tyro, DO sent at 11/13/2017  8:10 AM EDT ----- No, that wouldn't do it.  My first suspicions in a PD patient would be those multiple BP meds.  Did you do orthostatics? ----- Message ----- From: Marrian Salvage, FNP Sent: 11/12/2017   5:10 PM EDT To: Eustace Quail Tat, DO  Dr. Carles Collet,  I wanted to touch base with you about this pleasant patient. He was seen today with complaints of feeling "dizzy and off" for the past few days. The exam and labs here were unremarkable. I didn't know if any of the medication changes made last week could be causing these symptoms. I have asked him to reach out to you as well.  Thank you, Jodi Mourning, FNP

## 2017-11-21 ENCOUNTER — Ambulatory Visit: Payer: Self-pay

## 2017-11-21 ENCOUNTER — Ambulatory Visit: Payer: Medicare Other | Admitting: Family

## 2017-11-21 ENCOUNTER — Encounter: Payer: Self-pay | Admitting: Family

## 2017-11-21 VITALS — BP 110/72 | HR 81 | Temp 98.1°F | Ht 71.0 in | Wt 156.0 lb

## 2017-11-21 DIAGNOSIS — R42 Dizziness and giddiness: Secondary | ICD-10-CM

## 2017-11-21 MED ORDER — ESCITALOPRAM OXALATE 10 MG PO TABS: 10.0000 mg | ORAL_TABLET | Freq: Every day | ORAL | 2 refills | Status: DC

## 2017-11-21 NOTE — Telephone Encounter (Signed)
Pt calling c/o dizziness that began yesterday. Pt stated the dizziness is worse with laying or standing or change in head position. Pt was unable to sleep last night. Pt took a Meclizine without effect. Pt c/o nausea. HR 84 per minute. Pt was seen 11/12/17 for the dizziness. Pt stated he spoke to his neurologist and stated that none of his medications could be causing the symptoms. Pt given care advice and pt verbalized understanding. Appointment made for today with Jodi Mourning NP. Pt's PCP not on schedule today.  Reason for Disposition . [1] MODERATE dizziness (e.g., interferes with normal activities) AND [2] has been evaluated by physician for this  Answer Assessment - Initial Assessment Questions 1. DESCRIPTION: "Describe your dizziness."   Its there all the time- dizziness in my head 2. LIGHTHEADED: "Do you feel lightheaded?" (e.g., somewhat faint, woozy, weak upon standing)     woozy 3. VERTIGO: "Do you feel like either you or the room is spinning or tilting?" (i.e. vertigo)     Pt feells like he is spinnnig 4. SEVERITY: "How bad is it?"  "Do you feel like you are going to faint?" "Can you stand and walk?"   - MILD - walking normally   - MODERATE - interferes with normal activities (e.g., work, school)    - SEVERE - unable to stand, requires support to walk, feels like passing out now.  moderate 5. ONSET:  "When did the dizziness begin?"     Yesterday morning 6. AGGRAVATING FACTORS: "Does anything make it worse?" (e.g., standing, change in head position)     Laying down, standing and change in head position 7. HEART RATE: "Can you tell me your heart rate?" "How many beats in 15 seconds?"  (Note: not all patients can do this)       84 per minute 8. CAUSE: "What do you think is causing the dizziness?"     Pt doesn't know 9. RECURRENT SYMPTOM: "Have you had dizziness before?" If so, ask: "When was the last time?" "What happened that time?"     Yes- 2 weeks ago- seen Jodi Mourning NP and  was prescribed by meclizine 10. OTHER SYMPTOMS: "Do you have any other symptoms?" (e.g., fever, chest pain, vomiting, diarrhea, bleeding)       nauseated 11. PREGNANCY: "Is there any chance you are pregnant?" "When was your last menstrual period?"       n/a  Protocols used: DIZZINESS [facility]

## 2017-11-21 NOTE — Progress Notes (Signed)
Henry Adams is a 77 y.o. male with the following history as recorded in Epic[employer]:  Patient Active Problem List   Diagnosis Date Noted  . Urinary frequency 10/30/2017  . Calculus of gallbladder with chronic cholecystitis without obstruction 09/18/2017  . Chronic idiopathic constipation 08/19/2017  . Age-related cognitive decline 01/09/2017  . Anxiety and depression 10/24/2016  . Insomnia 10/24/2016  . BPH associated with nocturia 08/11/2015  . Routine general medical examination at a health care facility 08/11/2015  . Coarse tremor 04/04/2015  . Vocal cord dysfunction 11/04/2013  . Vocal cord atrophy 09/01/2013  . Essential hypertension 04/26/2007  . Allergic rhinitis 04/26/2007  . DEGENERATIVE JOINT DISEASE 04/26/2007  . G E R D 08/05/2006  . Helicobacter pylori infection 05/30/2006  . Hyperlipidemia with target LDL less than 160 05/30/2006  . Polycystic kidney 05/30/2006    Current Outpatient Medications  Medication Sig Dispense Refill  . buPROPion (WELLBUTRIN XL) 150 MG 24 hr tablet Take 1 tablet (150 mg total) by mouth daily. 30 tablet 5  . carbidopa-levodopa (SINEMET CR) 50-200 MG tablet Take 1 tablet by mouth at bedtime. 90 tablet 1  . carbidopa-levodopa (SINEMET IR) 25-100 MG tablet Take 1 tablet by mouth 4 (four) times daily. 120 tablet 5  . escitalopram (LEXAPRO) 10 MG tablet Take 1 tablet (10 mg total) by mouth daily. 30 tablet 2  . fexofenadine (ALLEGRA) 180 MG tablet Take 180 mg by mouth daily.      . hydrochlorothiazide (MICROZIDE) 12.5 MG capsule Take 12.5 mg by mouth daily.    [name] linaclotide (LINZESS) 290 MCG CAPS capsule Take 1 capsule (290 mcg total) by mouth daily before breakfast. 90 capsule 1  . meclizine (ANTIVERT) 12.5 MG tablet Take 1 tablet (12.5 mg total) by mouth 3 (three) times daily as needed for dizziness. 30 tablet 0  . omeprazole (PRILOSEC) 40 MG capsule TAKE (1) CAPSULE DAILY. 90 capsule 0  . telmisartan (MICARDIS) 40 MG tablet Take 20 mg by mouth  daily. Take 1/2 tablet (10 mg) daily.    . traZODone (DESYREL) 50 MG tablet TAKE 1 OR 2 TABLETS AT BEDTIME AS NEEDED FOR SLEEP. (Patient taking differently: No sig reported) 90 tablet 1  . vitamin B-12 (CYANOCOBALAMIN) 1000 MCG tablet Take 1,000 mcg by mouth daily.     No current facility-administered medications for this visit.     Allergies: Iodine; Diazepam; Oxycodone; Pollen extract; Tamsulosin; Doxazosin; Etodolac; and Tape  Past Medical History:  Diagnosis Date  . Allergy    perennial  . Anxiety   . Arthritis   . Cholelithiasis   . CKD (chronic kidney disease)   . Depression   . Diverticulosis   . Enlarged prostate   . Gallstones   . GERD (gastroesophageal reflux disease)   . HLD (hyperlipidemia)   . Hx of cardiovascular stress test    ETT-Myoview 6/14: probable low risk study, inf defect with normal inf wall motion-cannot r/o ischemia; prominent gut uptake adjacent to inf wall on rest images-poss defect is atten, EF 63%  . Hypertension   . Melanoma ([place])   . Polycystic kidney disease     Past Surgical History:  Procedure Laterality Date  . COLONOSCOPY  2011   diverticulosis  . HERNIA REPAIR     umbilical  . KNEE SURGERY Left   . medialization laryngoplasty  10/15/2013   Dr Joya Gaskins, [facility]  . TONSILLECTOMY AND ADENOIDECTOMY      Family History  Problem Relation Age of Onset  . Hypertension Father   .  Cancer Brother        mouth  . Cancer Sister        breast  . Heart disease Brother        heart transplant  . Cancer Daughter        breast  . Stroke Sister        >88  . Skin cancer Brother        squamous cell  . Colon cancer Neg Hx     Social History   Tobacco Use  . Smoking status: Never Smoker  . Smokeless tobacco: Never Used  Substance Use Topics  . Alcohol use: Yes    Alcohol/week: 0.0 standard drinks    Comment:  2-3 beers / month    Subjective:  Presents with concerns for persisting dizziness; patient is accompanied by his daughter denies  sensation that room is spinning; feels unsteady on his feet, light-headed, woozy; was seen last week with similar symptoms- had initial improvement on Meclizine last week but never resolved; apparently symptoms got worse again last night; orthostatics were done in the office and not concerning; reached out to patient's neurologist last week who did not think medication changes could be contributing;  Wonders if worsening anxiety could be contributing; 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  Head: Normocephalic and atraumatic  Eyes: Sclera and conjunctiva clear; pupils round and reactive to light; extraocular movements intact  Ears: External normal; canals clear; tympanic membranes normal  Oropharynx: Pink, supple. No suspicious lesions  Neck: Supple without thyromegaly, adenopathy  Lungs: Respirations unlabored; clear to auscultation bilaterally without wheeze, rales, rhonchi  CVS exam: normal rate and regular rhythm.  Musculoskeletal: No deformities; no active joint inflammation  Extremities: No edema, cyanosis, clubbing  Vessels: Symmetric bilaterally  Neurologic: Alert and oriented; speech intact; face symmetrical; moves all extremities well; CNII-XII intact without focal deficit  Assessment:  1. Dizziness     Plan:  ? Etiology; will plan to reach out to patient's neurologist again; EKG shows no acute change; orthostatics not concerning; update MRI, carotid dopplers; if neurology still feels not PD related, to consider cardiology  evaluation/ ENT; Also feel re-starting Lexapro appropriate; re-start 10 mg;  Spent 30 minutes with patient; greater than 50% spent in counseling;    No follow-ups on file.  Orders Placed This Encounter  Procedures  . EKG 12-Lead    Requested Prescriptions   Signed Prescriptions Disp Refills  . escitalopram (LEXAPRO) 10 MG tablet 30 tablet 2    Sig: Take 1 tablet (10 mg total) by mouth daily.

## 2017-11-22 ENCOUNTER — Encounter: Payer: Self-pay | Admitting: Family

## 2017-11-22 ENCOUNTER — Other Ambulatory Visit: Payer: Self-pay | Admitting: Family

## 2017-11-22 ENCOUNTER — Telehealth: Payer: Self-pay | Admitting: Family

## 2017-11-22 DIAGNOSIS — R350 Frequency of micturition: Secondary | ICD-10-CM | POA: Diagnosis not present

## 2017-11-22 DIAGNOSIS — N401 Enlarged prostate with lower urinary tract symptoms: Secondary | ICD-10-CM | POA: Diagnosis not present

## 2017-11-22 DIAGNOSIS — R3914 Feeling of incomplete bladder emptying: Secondary | ICD-10-CM | POA: Diagnosis not present

## 2017-11-22 NOTE — Telephone Encounter (Signed)
Please let him know that I didn't hear from Dr. Carles Collet today; I will check back on Monday.

## 2017-11-25 NOTE — Telephone Encounter (Signed)
Message sent to patient via mychart

## 2017-11-26 ENCOUNTER — Other Ambulatory Visit: Payer: Self-pay | Admitting: Family

## 2017-11-26 ENCOUNTER — Encounter: Payer: Self-pay | Admitting: Family

## 2017-11-27 ENCOUNTER — Other Ambulatory Visit: Payer: Self-pay | Admitting: Family

## 2017-11-27 DIAGNOSIS — R42 Dizziness and giddiness: Secondary | ICD-10-CM

## 2017-11-27 DIAGNOSIS — R29818 Other symptoms and signs involving the nervous system: Secondary | ICD-10-CM

## 2017-12-04 DIAGNOSIS — J3089 Other allergic rhinitis: Secondary | ICD-10-CM | POA: Diagnosis not present

## 2017-12-04 DIAGNOSIS — J301 Allergic rhinitis due to pollen: Secondary | ICD-10-CM | POA: Diagnosis not present

## 2017-12-04 DIAGNOSIS — J3081 Allergic rhinitis due to animal (cat) (dog) hair and dander: Secondary | ICD-10-CM | POA: Diagnosis not present

## 2017-12-06 ENCOUNTER — Other Ambulatory Visit: Payer: Self-pay

## 2017-12-06 DIAGNOSIS — K5904 Chronic idiopathic constipation: Secondary | ICD-10-CM

## 2017-12-08 MED ORDER — LINACLOTIDE 290 MCG PO CAPS: 290.0000 ug | ORAL_CAPSULE | Freq: Every day | ORAL | 1 refills | Status: DC

## 2017-12-11 ENCOUNTER — Ambulatory Visit
Admission: RE | Admit: 2017-12-11 | Discharge: 2017-12-11 | Disposition: A | Discharge status: Home / Self Care | Payer: Medicare Other | Source: Ambulatory Visit | Attending: Family | Admitting: Family

## 2017-12-11 DIAGNOSIS — R42 Dizziness and giddiness: Secondary | ICD-10-CM

## 2017-12-11 DIAGNOSIS — D320 Benign neoplasm of cerebral meninges: Secondary | ICD-10-CM | POA: Diagnosis not present

## 2017-12-11 DIAGNOSIS — R29818 Other symptoms and signs involving the nervous system: Secondary | ICD-10-CM

## 2017-12-12 ENCOUNTER — Encounter: Payer: Self-pay | Admitting: Family

## 2017-12-21 ENCOUNTER — Encounter: Payer: Self-pay | Admitting: Internal Medicine

## 2017-12-25 ENCOUNTER — Ambulatory Visit (INDEPENDENT_AMBULATORY_CARE_PROVIDER_SITE_OTHER): Payer: Medicare Other | Admitting: Internal Medicine

## 2017-12-25 ENCOUNTER — Ambulatory Visit (INDEPENDENT_AMBULATORY_CARE_PROVIDER_SITE_OTHER)
Admission: RE | Admit: 2017-12-25 | Discharge: 2017-12-25 | Disposition: A | Discharge status: Home / Self Care | Payer: Medicare Other | Source: Ambulatory Visit | Attending: Internal Medicine | Admitting: Internal Medicine

## 2017-12-25 ENCOUNTER — Encounter: Payer: Self-pay | Admitting: Internal Medicine

## 2017-12-25 VITALS — BP 110/80 | HR 74 | Temp 97.5°F | Resp 16 | Ht 71.0 in | Wt 153.8 lb

## 2017-12-25 DIAGNOSIS — I10 Essential (primary) hypertension: Secondary | ICD-10-CM

## 2017-12-25 DIAGNOSIS — R140 Abdominal distension (gaseous): Secondary | ICD-10-CM | POA: Diagnosis not present

## 2017-12-25 DIAGNOSIS — F419 Anxiety disorder, unspecified: Secondary | ICD-10-CM | POA: Diagnosis not present

## 2017-12-25 DIAGNOSIS — K5900 Constipation, unspecified: Secondary | ICD-10-CM | POA: Diagnosis not present

## 2017-12-25 DIAGNOSIS — F329 Major depressive disorder, single episode, unspecified: Secondary | ICD-10-CM

## 2017-12-25 DIAGNOSIS — R1900 Intra-abdominal and pelvic swelling, mass and lump, unspecified site: Secondary | ICD-10-CM

## 2017-12-25 DIAGNOSIS — F32A Depression, unspecified: Secondary | ICD-10-CM

## 2017-12-25 MED ORDER — DIAZEPAM 2 MG PO TABS: 2.0000 mg | ORAL_TABLET | Freq: Three times a day (TID) | ORAL | 3 refills | Status: DC | PRN

## 2017-12-25 NOTE — Progress Notes (Signed)
Subjective:  Patient ID: Henry Adams, male    DOB: 07-16-1940  Age: 77 y.o. MRN: [id number]  CC: Hypertension   HPI Henry Adams presents for f/up - see MyChart message  Dad has been seeing Jodi Mourning recently (in your absence) about dizziness. We have been very happy with her follow up and how she has been addressing his issues. I think because you are his doctor and you have a good working relationship and history, Dad would like for you follow up with this issue.     In the past few weeks, Dad experiences dizziness the first part of the day, but it hasn't been debilitating. We recently moved my mom to a memory care facility, and it has been very difficult. It was a particularly difficult visit this afternoon and then Dad had a bad bout of dizziness. He took half of the travel sickness OTC medicine suggested by [place] and has been taking it easy, drinking fluids. By early evening he was feeling better.     In light of the good results from the recent MRI and ultrasound, how would you recommend we proceed? In addition, he is still experiencing a lot of discomfort in his stomach and abdomen. It seems that the constipation issue has been addressed with regular bowel emptying.     Thank you,  [name] and Milbert Coulter     Outpatient Medications Prior to Visit  Medication Sig Dispense Refill  . buPROPion (WELLBUTRIN XL) 150 MG 24 hr tablet Take 1 tablet (150 mg total) by mouth daily. 30 tablet 5  . carbidopa-levodopa (SINEMET CR) 50-200 MG tablet Take 1 tablet by mouth at bedtime. 90 tablet 1  . carbidopa-levodopa (SINEMET IR) 25-100 MG tablet Take 1 tablet by mouth 4 (four) times daily. 120 tablet 5  . escitalopram (LEXAPRO) 10 MG tablet Take 1 tablet (10 mg total) by mouth daily. 30 tablet 2  . fexofenadine (ALLEGRA) 180 MG tablet Take 180 mg by mouth daily.      [name] linaclotide (LINZESS) 290 MCG CAPS capsule Take 1 capsule (290 mcg total) by mouth daily before breakfast. 90 capsule 1  . meclizine  (ANTIVERT) 12.5 MG tablet Take 1 tablet (12.5 mg total) by mouth 3 (three) times daily as needed for dizziness. 30 tablet 0  . omeprazole (PRILOSEC) 40 MG capsule TAKE (1) CAPSULE DAILY. 90 capsule 0  . telmisartan (MICARDIS) 40 MG tablet Take 20 mg by mouth daily. Take 1/2 tablet (10 mg) daily.    . traZODone (DESYREL) 50 MG tablet TAKE 1 OR 2 TABLETS AT BEDTIME AS NEEDED FOR SLEEP. (Patient taking differently: No sig reported) 90 tablet 1  . vitamin B-12 (CYANOCOBALAMIN) 1000 MCG tablet Take 1,000 mcg by mouth daily.    . hydrochlorothiazide (MICROZIDE) 12.5 MG capsule Take 12.5 mg by mouth daily.     No facility-administered medications prior to visit.     ROS Review of Systems  Constitutional: Negative for activity change, diaphoresis, fatigue and unexpected weight change.  HENT: Negative.  Negative for trouble swallowing.   Eyes: Negative for visual disturbance.  Respiratory: Negative for cough, chest tightness, shortness of breath and wheezing.   Gastrointestinal: Positive for abdominal distention and constipation. Negative for abdominal pain, blood in stool, diarrhea, nausea and vomiting.       He complains that he only has a bowel movement every other day  Genitourinary: Negative.  Negative for decreased urine volume, difficulty urinating, dysuria, flank pain, frequency, hematuria and urgency.  Musculoskeletal:  Negative.  Negative for myalgias.  Skin: Negative.   Allergic/Immunologic: Negative.   Neurological: Positive for dizziness, tremors and light-headedness.  Hematological: Negative for adenopathy. Does not bruise/bleed easily.  Psychiatric/Behavioral: Positive for confusion, decreased concentration and dysphoric mood. Negative for hallucinations, self-injury, sleep disturbance and suicidal ideas. The patient is nervous/anxious. The patient is not hyperactive.        He complains of an overwhelming sensation of anxiety and panic.    Objective:  BP 110/80 (BP Location: Left  Arm, Patient Position: Sitting, Cuff Size: Normal)   Pulse 74   Temp (!) 97.5 F (36.4 C) (Oral)   Resp 16   Ht 5\' 11"  (1.803 m)   Wt 153 lb 12 oz (69.7 kg)   SpO2 99%   BMI 21.44 kg/m   BP Readings from Last 3 Encounters:  12/25/17 110/80  11/21/17 110/72  11/12/17 114/70    Wt Readings from Last 3 Encounters:  12/25/17 153 lb 12 oz (69.7 kg)  11/21/17 156 lb (70.8 kg)  11/12/17 158 lb 0.2 oz (71.7 kg)    Physical Exam  Constitutional: He is oriented to person, place, and time. No distress.  HENT:  Mouth/Throat: Oropharynx is clear and moist. No oropharyngeal exudate.  Eyes: Conjunctivae are normal. No scleral icterus.  Neck: Normal range of motion. Neck supple. No JVD present. No thyromegaly present.  Cardiovascular: Normal rate, regular rhythm and normal heart sounds. Exam reveals no gallop.  No murmur heard. Pulmonary/Chest: Effort normal and breath sounds normal. No respiratory distress. He has no wheezes. He has no rales.  Abdominal: Soft. Bowel sounds are normal. He exhibits distension and mass. He exhibits no fluid wave and no ascites. There is no hepatosplenomegaly. There is no tenderness. A hernia is present. Hernia confirmed positive in the ventral area.    Musculoskeletal: Normal range of motion. He exhibits no edema, tenderness or deformity.  Lymphadenopathy:    He has no cervical adenopathy.  Neurological: He is alert and oriented to person, place, and time.  Skin: Skin is warm and dry. No rash noted. He is not diaphoretic.  Psychiatric: Judgment and thought content normal. His mood appears anxious. His affect is not angry, not blunt, not labile and not inappropriate. His speech is delayed and tangential. His speech is not rapid and/or pressured and not slurred. He is slowed and withdrawn. He is not agitated. Thought content is not paranoid. Cognition and memory are impaired. He exhibits a depressed mood. He expresses no homicidal and no suicidal ideation. He is  communicative. He exhibits abnormal recent memory and abnormal remote memory.  Vitals reviewed.   Lab Results  Component Value Date   WBC 4.4 11/12/2017   HGB 14.4 11/12/2017   HCT 42.2 11/12/2017   PLT 167.0 11/12/2017   GLUCOSE 85 11/12/2017   CHOL 179 11/14/2016   TRIG 73.0 11/14/2016   HDL 68.50 11/14/2016   LDLDIRECT 131.7 07/02/2011   LDLCALC 96 11/14/2016   ALT 8 11/12/2017   AST 14 11/12/2017   NA 134 (L) 11/12/2017   K 3.5 11/12/2017   CL 98 11/12/2017   CREATININE 1.59 (H) 11/12/2017   BUN 22 11/12/2017   CO2 31 11/12/2017   TSH 1.64 11/12/2017   PSA 2.28 08/11/2015   INR 1.1 (H) 08/01/2012   HGBA1C 5.9 12/29/2009    Mr Brain Wo Contrast  Result Date: 12/12/2017 CLINICAL DATA:  Nine month history of memory disturbance and speech disturbance. EXAM: MRI HEAD WITHOUT CONTRAST TECHNIQUE: Multiplanar, multiecho pulse  sequences of the brain and surrounding structures were obtained without intravenous contrast. COMPARISON:  CT 08/17/2017.  MRI 04/25/2015. FINDINGS: Brain: Diffusion imaging does not show any acute or subacute infarction or other cause of restricted diffusion. The brainstem and cerebellum are normal. Cerebral hemispheres show age related volume loss with mild to moderate chronic small-vessel ischemic change of the hemispheric white matter, slightly progressive since 2017. No cortical or large vessel territory infarction. No evidence of intra-axial mass lesion, hemorrhage, hydrocephalus or extra-axial collection. There is a 9 x 10 mm meningioma at the posterior foramen magnum level in the midline without significant encroachment upon the foramen magnum or cerebellum. This is not enlarged compared to the study of 2017 and is therefore likely slow growing and not of clinical significance. Vascular: Major vessels at the base of the brain show flow. Skull and upper cervical spine: Negative Sinuses/Orbits: Clear/normal Other: None IMPRESSION: Age related atrophy. Mild to  moderate chronic small-vessel ischemic changes of the cerebral hemispheric white matter, slightly progressive since 2017. No acute or reversible finding. 9-10 mm meningioma at the posterior midline foramen magnum, not measurably enlarged since 2017 and without significant encroachment upon the foramen magnum or neural structures and therefore likely incidental. Electronically Signed   By: Nelson Chimes M.D.   On: 12/12/2017 06:53   US Carotid Duplex Bilateral  Result Date: 12/11/2017 CLINICAL DATA:  77 year old male with dizziness EXAM: BILATERAL CAROTID DUPLEX ULTRASOUND TECHNIQUE: Pearline Cables scale imaging, color Doppler and duplex ultrasound were performed of bilateral carotid and vertebral arteries in the neck. COMPARISON:  None. FINDINGS: Criteria: Quantification of carotid stenosis is based on velocity parameters that correlate the residual internal carotid diameter with NASCET-based stenosis levels, using the diameter of the distal internal carotid lumen as the denominator for stenosis measurement. The following velocity measurements were obtained: RIGHT ICA: 52/14 cm/sec CCA: 24/26 cm/sec SYSTOLIC ICA/CCA RATIO:  0.6 ECA:  67 cm/sec LEFT ICA: 62/15 cm/sec CCA: 83/41 cm/sec SYSTOLIC ICA/CCA RATIO:  0.8 ECA:  68 cm/sec RIGHT CAROTID ARTERY: No significant atherosclerotic plaque or evidence of stenosis in the internal carotid artery. Mild heterogeneous atherosclerotic plaque is present in the proximal external carotid artery. RIGHT VERTEBRAL ARTERY:  Patent with normal antegrade flow. LEFT CAROTID ARTERY: No significant atherosclerotic plaque or evidence of stenosis in the internal carotid artery. LEFT VERTEBRAL ARTERY:  Patent with normal antegrade flow. IMPRESSION: 1. No significant atherosclerotic plaque or evidence of stenosis in either internal carotid artery. 2. The vertebral arteries are patent with normal antegrade flow. Signed, Criselda Peaches, MD, [facility] Radiology Electronically Signed   By: Jacqulynn Cadet M.D.   On: 12/11/2017 17:12    Dg Abd Acute W/chest  Result Date: 12/25/2017 CLINICAL DATA:  Abdominal distention. Right flank abdominal wall mass. EXAM: DG ABDOMEN ACUTE W/ 1V CHEST COMPARISON:  08/19/2017 FINDINGS: Large volume stool to distending the colon diffusely. No evidence of small bowel obstruction. No definite rectal impaction. No concerning mass effect or calcification. Normal heart size and mediastinal contours. No acute infiltrate or edema. No effusion or pneumothorax. No acute osseous findings. IMPRESSION: Constipation with extensive stool retention. Electronically Signed   By: Monte Fantasia M.D.   On: 12/25/2017 10:57    Assessment & Plan:   Aithan was seen today for hypertension.  Diagnoses and all orders for this visit:  Abdominal distension- His abdominal distention is consistent with constipation.  He is taking maximum dose Linzess.  I have asked his daughter to add  a couple of doses of MiraLAX during the week to help remove the stool burden as I think this is contributing to his symptoms. -     DG Abd Acute W/Chest; Future  Abdominal wall mass of right flank- This mass is consistent with his history of polycystic kidneys. -     DG Abd Acute W/Chest; Future  Anxiety and depression -     diazepam (VALIUM) 2 MG tablet; Take 1 tablet (2 mg total) by mouth every 8 (eight) hours as needed for anxiety.  Essential hypertension- His blood pressure is over controlled and he is symptomatic with dizziness and lightheadedness.  Additionally, his recent labs showed mild hyponatremia.  I have therefore asked him to stop taking the thiazide diuretic.  He will continue the ARB.   I have discontinued Josie Saunders. Thibault's hydrochlorothiazide. I am also having him start on diazepam. Additionally, I am having him maintain his fexofenadine, telmisartan, carbidopa-levodopa, buPROPion, omeprazole, vitamin B-12,  traZODone, carbidopa-levodopa, meclizine, escitalopram, and linaclotide.  Meds ordered this encounter  Medications  . diazepam (VALIUM) 2 MG tablet    Sig: Take 1 tablet (2 mg total) by mouth every 8 (eight) hours as needed for anxiety.    Dispense:  65 tablet    Refill:  3     Follow-up: Return in about 3 months (around 03/26/2018).  Scarlette Calico, MD

## 2017-12-25 NOTE — Patient Instructions (Signed)

## 2018-01-02 DIAGNOSIS — J3089 Other allergic rhinitis: Secondary | ICD-10-CM | POA: Diagnosis not present

## 2018-01-02 DIAGNOSIS — J3081 Allergic rhinitis due to animal (cat) (dog) hair and dander: Secondary | ICD-10-CM | POA: Diagnosis not present

## 2018-01-02 DIAGNOSIS — J301 Allergic rhinitis due to pollen: Secondary | ICD-10-CM | POA: Diagnosis not present

## 2018-01-03 DIAGNOSIS — I129 Hypertensive chronic kidney disease with stage 1 through stage 4 chronic kidney disease, or unspecified chronic kidney disease: Secondary | ICD-10-CM | POA: Diagnosis not present

## 2018-01-03 DIAGNOSIS — E785 Hyperlipidemia, unspecified: Secondary | ICD-10-CM | POA: Diagnosis not present

## 2018-01-03 DIAGNOSIS — N183 Chronic kidney disease, stage 3 (moderate): Secondary | ICD-10-CM | POA: Diagnosis not present

## 2018-01-03 DIAGNOSIS — Q613 Polycystic kidney, unspecified: Secondary | ICD-10-CM | POA: Diagnosis not present

## 2018-01-21 ENCOUNTER — Encounter: Payer: Self-pay | Admitting: Internal Medicine

## 2018-01-21 ENCOUNTER — Ambulatory Visit: Payer: Self-pay

## 2018-01-21 NOTE — Telephone Encounter (Signed)
Pt called to states that he has had difficulty sleeping He states that when he lays sown his body starts to tingle and he has to get OOB.  He states that he has had these symptoms for a while but that they are brief 30 minutes to 1hour.  Last night it kept him up all night. He denies suicidal/homisidal ideations He states he has had multiple changes to his life recently. He states that there may be something wrong with his kidneys. He states his wife has alzheimer's. He says that he has been tested but doesn't have it. He describes the alzheimer's as something men and women get that kills them in just a few months. He states that he lives with his daughter and her husband is with him today because his daughter is working. Appointment scheduled per protocol.  Care advice read to patient. Pt verbalized understanding of all instructions. Reason for Disposition . Symptoms interfere with sleep  Answer Assessment - Initial Assessment Questions 1. CONCERN: "What happened that made you call today?"     Jittery last night legs tingle 2. ANXIETY SYMPTOM SCREENING: "Can you describe how you have been feeling?"  (e.g., tense, restless, panicky, anxious, keyed up, trouble sleeping, trouble concentrating)     Trouble sleeping anxious 3. ONSET: "How long have you been feeling this way?"     Last night lasted all night but usually only last 30 minutes gone now 4. RECURRENT: "Have you felt this way before?"  If yes: "What happened that time?" "What helped these feelings go away in the past?"      Yes but 30 minutes and then able to sleep 5. RISK OF HARM - SUICIDAL IDEATION:  "Do you ever have thoughts of hurting or killing yourself?"  (e.g., yes, no, no but preoccupation with thoughts about death)   - INTENT:  "Do you have thoughts of hurting or killing yourself right NOW?" (e.g., yes, no, N/A)   - PLAN: "Do you have a specific plan for how you would do this?" (e.g., gun, knife, overdose, no plan, N/A)     No 6.  RISK OF HARM - HOMICIDAL IDEATION:  "Do you ever have thoughts of hurting or killing someone else?"  (e.g., yes, no, no but preoccupation with thoughts about death)   - INTENT:  "Do you have thoughts of hurting or killing someone right NOW?" (e.g., yes, no, N/A)   - PLAN: "Do you have a specific plan for how you would do this?" (e.g., gun, knife, no plan, N/A)     No 7. FUNCTIONAL IMPAIRMENT: "How have things been going for you overall in your life? Have you had any more difficulties than usual doing your normal daily activities?"  (e.g., better, same, worse; self-care, school, work, Tree [employer])     Leave social enviroment sometimes  8. SUPPORT: "Who is with you now?" "Who do you live with?" "Do you have family or friends nearby who you can talk to?"      Daughters her husband is there 42. THERAPIST: "Do you have a counselor or therapist? Name?"     no 10. STRESSORS: "Has there been any new stress or recent changes in your life?"       Kidney problem 11. CAFFEINE ABUSE: "Do you drink caffeinated beverages, and how much each day?" (e.g., coffee, tea, colas)       no 12. SUBSTANCE ABUSE: "Do you use any illegal drugs or alcohol?"       no 13. OTHER SYMPTOMS: "  Do you have any other physical symptoms right now?" (e.g., chest pain, palpitations, difficulty breathing, fever)       no 14. PREGNANCY: "Is there any chance you are pregnant?" "When was your last menstrual period?"       N/A  Protocols used: ANXIETY AND PANIC ATTACK-A-AH

## 2018-01-23 ENCOUNTER — Encounter: Payer: Self-pay | Admitting: Internal Medicine

## 2018-01-23 ENCOUNTER — Ambulatory Visit: Payer: Medicare Other | Admitting: Internal Medicine

## 2018-01-23 ENCOUNTER — Other Ambulatory Visit (INDEPENDENT_AMBULATORY_CARE_PROVIDER_SITE_OTHER): Payer: Medicare Other

## 2018-01-23 ENCOUNTER — Encounter: Payer: Self-pay | Admitting: Neurology

## 2018-01-23 ENCOUNTER — Ambulatory Visit: Payer: Medicare Other | Admitting: Neurology

## 2018-01-23 VITALS — BP 122/80 | HR 70 | Temp 97.7°F | Ht 71.0 in | Wt 159.0 lb

## 2018-01-23 VITALS — BP 102/64 | HR 74 | Ht 71.0 in | Wt 160.0 lb

## 2018-01-23 DIAGNOSIS — G20 Parkinson's disease: Secondary | ICD-10-CM

## 2018-01-23 DIAGNOSIS — G2581 Restless legs syndrome: Secondary | ICD-10-CM

## 2018-01-23 DIAGNOSIS — F419 Anxiety disorder, unspecified: Secondary | ICD-10-CM | POA: Diagnosis not present

## 2018-01-23 DIAGNOSIS — R42 Dizziness and giddiness: Secondary | ICD-10-CM | POA: Diagnosis not present

## 2018-01-23 DIAGNOSIS — F330 Major depressive disorder, recurrent, mild: Secondary | ICD-10-CM

## 2018-01-23 DIAGNOSIS — F329 Major depressive disorder, single episode, unspecified: Secondary | ICD-10-CM

## 2018-01-23 DIAGNOSIS — G4700 Insomnia, unspecified: Secondary | ICD-10-CM | POA: Diagnosis not present

## 2018-01-23 LAB — FERRITIN: Ferritin: 27.2 ng/mL (ref 22.0–322.0)

## 2018-01-23 MED ORDER — CLONAZEPAM 0.5 MG PO TABS: 0.2500 mg | ORAL_TABLET | Freq: Every day | ORAL | 0 refills | Status: DC

## 2018-01-23 MED ORDER — RAMELTEON 8 MG PO TABS: 8.0000 mg | ORAL_TABLET | Freq: Every day | ORAL | 0 refills | Status: DC

## 2018-01-23 MED ORDER — CARBIDOPA-LEVODOPA ER 25-100 MG PO TBCR: 1.0000 | EXTENDED_RELEASE_TABLET | Freq: Every day | ORAL | 1 refills | Status: DC

## 2018-01-23 NOTE — Patient Instructions (Signed)
1. You have been referred to [facility]. They will call you directly to schedule an appointment.  Please call [phone] if you do not hear from them.   2. Stop Valium. Start Clonazepam 0.5 mg - half tablet at bedtime. Prescription has been sent to your pharmacy.   3. Stop Carbidopa Levodopa 50/200 CR at bedtime and start Carbidopa Levodopa 25/100 CR at bedtime.   4. Keep appt in March.

## 2018-01-23 NOTE — Assessment & Plan Note (Signed)
Suspect acute worsening due to social changes. He is taking lexapro and wellbutrin and will not adjust today. He was given valium at last visit with PCP and advised that this is not safe long term due to increased risk of falls and memory changes.

## 2018-01-23 NOTE — Assessment & Plan Note (Signed)
Has stopped trazodone and will try ramelteon as this is a very safe option for him. He is using valium for sleep and discouraged that due to long term side effects.

## 2018-01-23 NOTE — Progress Notes (Signed)
   Subjective:   Patient ID: Henry Adams, male    DOB: [birth year], 78 y.o.   MRN: [id number]  HPI The patient is a 78 YO man coming in for anxiety and sleep problems. He has had several medication changes in the last 2-3 months including increase in evening dosing of carbidopa. He has stopped taking BP meds in the last month or so due to low BP. He is having slight increase in leg swelling. Does not elevate legs. Has old compression stockings and wants to know if he can use.  He is anxious some at home and cannot sleep. He has had off and on sleep problems for some time usually 30 minutes to 1 hour until he could sleep. In the last 3-4 days he is getting some restlessness in his legs and needs to move from bed to walking to sitting etc. 2 days ago he did not sleep at all. Last 2 nights he has taken the valium and slept about 2-3 hours. Was not well rested during the day. He stopped taking trazodone about 1 month ago due to not wanting to take it and feeling like it was not helping. Weaned himself off and stopped altogether about 1 month ago. Denies SI/HI. Is feeling some changes due to wife moving to memory care. This has changed things quite significantly. Daughter is with him today to help provide history.   Review of Systems  Constitutional: Negative.   HENT: Negative.   Eyes: Negative.   Respiratory: Negative for cough, chest tightness and shortness of breath.   Cardiovascular: Negative for chest pain, palpitations and leg swelling.  Gastrointestinal: Negative for abdominal distention, abdominal pain, constipation, diarrhea, nausea and vomiting.  Musculoskeletal: Negative.   Skin: Negative.   Neurological: Negative.   Psychiatric/Behavioral: Positive for dysphoric mood and sleep disturbance. Negative for agitation, behavioral problems, confusion, decreased concentration, hallucinations, self-injury and suicidal ideas. The patient is nervous/anxious. The patient is not hyperactive.      Objective:  Physical Exam Constitutional:      Appearance: He is well-developed.  HENT:     Head: Normocephalic and atraumatic.     Comments: Hearing loss Neck:     Musculoskeletal: Normal range of motion.  Cardiovascular:     Rate and Rhythm: Normal rate and regular rhythm.  Pulmonary:     Effort: Pulmonary effort is normal. No respiratory distress.     Breath sounds: Normal breath sounds. No wheezing or rales.  Abdominal:     General: Bowel sounds are normal. There is no distension.     Palpations: Abdomen is soft.     Tenderness: There is no abdominal tenderness. There is no rebound.  Skin:    General: Skin is warm and dry.  Neurological:     Mental Status: He is alert and oriented to person, place, and time.     Coordination: Coordination normal.  Psychiatric:     Comments: Some flat affect, not very communicative     Vitals:   01/23/18 0943  BP: 122/80  Pulse: 70  Temp: 97.7 F (36.5 C)  TempSrc: Oral  SpO2: 99%  Weight: 159 lb (72.1 kg)  Height: 5\' 11"  (1.803 m)    Assessment & Plan:

## 2018-01-23 NOTE — Assessment & Plan Note (Signed)
Checking ferritin to see if there is a decline that could be causing some of the new restlessness. Possible relationship to increased dosing carbidopa.

## 2018-01-23 NOTE — Patient Instructions (Signed)
We are checking the blood test today.  We have sent in ramelteon to try taking at night for 1-2 weeks to see if this will work.

## 2018-01-23 NOTE — Progress Notes (Signed)
Henry Adams was seen today in the movement disorders clinic for neurologic consultation at the request of Henry Lima, MD.    Prior records made available to me were reviewed.  The patient first presented to [facility] in August, 2015 with complaints of a weak voice.    States that his voice was getting soft for a few years prior to that.   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   It helped for 4-5 months.   He recently followed up with his ENT physician at [facility] on 03/23/2015 complaining of continued weak voice, but they felt that his voice was fairly strong and did not see any pathology.  He was to follow-up with the voice lab.   The voice therapy didn't seem to help.   He did follow up with his primary care physician and was complaining about some mild tremor and was referred here to rule out Parkinson's disease.  Pt states that it is when he holds something proximally that he notes tremor bilaterally.  He also notes difficulty with handwriting; it is small and "squiggly."   02/01/17 update: Patient is seen today in follow-up, although I have not seen him in 2 years.  He is here to discuss memory change per referral but comes with a list of many other issues.  This patient is accompanied in the office by his daughter who supplements the history.    I have reviewed numerous records made available to me.  Pt reports he has been concerned about memory for about 10 months, although he complained about this concern to me about 2 years ago although there was no evidence of dementia then.  He was placed on Lexapro in October for concerns of anxiety and depression, which could potentially affect memory.  Pt thinks that was helpful but then later says that anxiety and depression are his biggest issue.  Living situation:  Pt lives with their spouse.  However, the patient's wife has Alzheimer dementia and the  patient is responsible for her care.   They live independently in retirement living.   The patient does do the finances in the home. Pt reports that he can do these but he has low initiative/motivation to do that. He isn't really sure that he has a memory problem but a motivation/energy problem.   He hasn't missed paying any bills.  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  He notes trouble with his cursive writing as well.   The patient does drive.  He has had no problem with that.  He programs his GPS himself and goes.   There have no been any motor vehicle accidents in the recent years.  The patient does not cook.  He states that they provide meals at retirement living .  He works out at [employer] at retirement living.  ADL's:  The patient is able to perform his own ADL's. The patient self medicates.  The patients bladder and bowel are under good control.   Behavior:  There have been no behavioral changes over the years.  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  Wifes personality has also changed with AD; becomes  agitated and paranoid.  Pt taking all the "brunt" of that per daughter.    03/12/17 update: Patient was seen today as a work in, at his request.  This patient is accompanied in the office by his daughter who supplements the history.Last visit, he was complaining about memory change and he was scheduled for neurocognitive testing.  This has not yet been completed, due to the fact that there is a lengthy wait list for this and the fact that he requested an early work in appointment.  Lab work was done for peripheral neuropathy as well as for myasthenia, and all of this was unremarkable.  We did start him on levodopa last visit and was to work up to carbidopa/levodopa 25/100 over a 4 week time span.   He is taking it 8am/noon/midnight.  Stays up to take at midnight so can spread evenly q 8 hours.   "I have made some progress and there are some days that I sleep all night."  Biggest c/o is restlessness and leg pain at night.  He has trouble in the AM for about 2-3 hours.  The trouble he describes are aches.  "so much of the trouble centers around neuropathy."  Daughter states that big contributor is they have had some bad times in taking care of wife and note that stress increases physical symptoms.  On lexapro 20 mg - been on that for about 2 months per patient.  He isn't sure that it is helping.  Daughter not sure.  Would like something else  07/01/17 update: Patient is seen today for follow-up for parkinsonism.  He was told to take carbidopa/levodopa 25/100 at 7 AM/11 AM/3 PM/7 PM and added carbidopa/levodopa 25/100 CR at bedtime to see if that would help restless leg syndrome.   He has had no falls but he did have his knee "give out" and had a near fall and went to PT and is better.  "I'm doing so much better."   The records that were made available to me were reviewed.  he saw Dr. Si Raider for neuropscyhometric testing on 05/23/17 and subsequently had a feedback session with him where results and recommendations were given to him.  These are detailed within the chart.  Pt did not have any evidence of dementia.  Pt dx with adjustment d/o with depressed and anxious mood.   Recommended respite care for him.  He was on lexapro and last visit we discussed remeron but told him to f/u with PCP.  He did see Dr. Sharlet Salina and he was told that he should f/u with Henry Lima, MD as the medication wouldn't help his "leg wandering sensation.  Ultimately, he was given a RX for trazdone by Dr. Sharlet Salina.  He states that it is helping.  Once per month, he may have an "irritable day" but otherwise doing well.    This has helped the RLS.  Still caregiving for wife who has AD.  He states that "she is living in a different  world."  Daughters are helping some but unsure if able to do on the longterm.  He is on lexapro and not sure if working as depression not as bad as it was but has it.    11/05/17 update: Patient is seen today in follow-up for parkinsonism.  He is on carbidopa/levodopa 25/100, 1 tablet at 7 AM/11 AM/3 PM/7 PM and carbidopa/levodopa 25/100 cr at bedtime.  He finds that by 9pm, he is restless and has trouble relaxing.   No  falls since last visit.  No hallucinations.  Records are reviewed since our last visit.  He was in the emergency room at the end of July with complaints of dizziness and abdominal pain.  Was given labetalol in the emergency room for high blood pressure.  Abdominal pain had resolved before getting to the ER.  Patient called the answering service the day after that ER visit and reported that he thought the symptoms were from starting Wellbutrin, but the emergency third room thought it was from abruptly stopping trazodone.  He was told to decrease the Wellbutrin from 300 mg to 150 mg.  Dr. Alain Marion placed him back on low-dose trazodone.  He isn't sleeping well.  His mood has been up and down.  They have moved to independent living at [place] and his wife is having a tough time dealing with that.  She takes it out on the patient.  He was also treated for constipation with Linzess.  Patient did not find that helpful.  He subsequently had an ultrasound which demonstrated a 2 cm gallstone with a thickened gallbladder wall.  He was referred to general surgery.  I have no notes about that and he states that he saw the surgeon and was told that they will just watch it.  He did see gastroenterology for constipation and he was told to continue Linzess, start MiraLAX daily as well as Colace.  Daughter states that it is better but not completely resolved  01/23/18 update: Patient was worked into the office today for parkinsonism.  He is accompanied by his daughter who supplements the history.  Daughter  emailed me a few days ago to let me know that the patient was having more trouble sleeping and felt that it was due to body movements.  She felt these movements had been going on for a few days to 1 week.  Last visit, I recognized that he had been having more restless leg and we slightly change his medication because of that.  He is now taking carbidopa/levodopa 25/100, 1 tablet at 7 AM/11 AM/3 PM and 1.5 tablets at 7 PM.  I also increased his bedtime carbidopa/levodopa to 50/200.  He thinks that "this is the medication I have trouble with."  When asked what he means, he states that he cannot sleep.  He did stop his trazodone because he thought it wasn't helping sleep (daughter thinks that he might have also thought that it contributed to dizziness).  He states that when he lays in the bed, he gets a pain in the legs and then if he gets up, it persists.  He has taken valium the last few nights and it has helped.  PCP just gave him RX for rozerem but he hasn't filled it yet.   Patient has been taking Antivert for dizziness.  His nephrologist had changed some other medications recently.  In addition, he had more confusion.  She also asked about getting the patient rescheduled for repeat neurocognitive testing.  He also had significant life change.  He was the primary caregiver for his wife, who has since been moved to a memory care unit.  He is not adjusting well to the change.  He does have a counselor but isn't sure it is helping.  "I've never been depressed and I'm not sure how to deal with this."   PREVIOUS MEDICATIONS: given RX for aricept on 01/09/17 but pt reported vivid scary dreams with it after 3 days and he d/c it.  He started  on namenda    ALLERGIES:   Allergies  Allergen Reactions  . Iodine     rash  . Oxycodone     Mental status changes & nausea  . Pollen Extract Itching  . Tamsulosin     Other reaction(s): Other (See Comments) Unknown   . Doxazosin Rash    agitation  . Etodolac Rash      unknown  . Tape Rash    Certain "Band-Aids"    CURRENT MEDICATIONS:  Outpatient Encounter Medications as of 01/23/2018  Medication Sig  . buPROPion (WELLBUTRIN XL) 150 MG 24 hr tablet Take 1 tablet (150 mg total) by mouth daily.  . carbidopa-levodopa (SINEMET CR) 50-200 MG tablet Take 1 tablet by mouth at bedtime.  . carbidopa-levodopa (SINEMET IR) 25-100 MG tablet Take 1 tablet by mouth 4 (four) times daily.  . diazepam (VALIUM) 2 MG tablet Take 1 tablet (2 mg total) by mouth every 8 (eight) hours as needed for anxiety.  . fexofenadine (ALLEGRA) 180 MG tablet Take 180 mg by mouth daily.    [name] linaclotide (LINZESS) 290 MCG CAPS capsule Take 1 capsule (290 mcg total) by mouth daily before breakfast.  . meclizine (ANTIVERT) 12.5 MG tablet Take 1 tablet (12.5 mg total) by mouth 3 (three) times daily as needed for dizziness.  [name] omeprazole (PRILOSEC) 40 MG capsule TAKE (1) CAPSULE DAILY.  . ramelteon (ROZEREM) 8 MG tablet Take 1 tablet (8 mg total) by mouth at bedtime.  . Tolvaptan (JYNARQUE PO) Take by mouth.  . vitamin B-12 (CYANOCOBALAMIN) 1000 MCG tablet Take 1,000 mcg by mouth daily.  . [DISCONTINUED] escitalopram (LEXAPRO) 10 MG tablet Take 1 tablet (10 mg total) by mouth daily.  . [DISCONTINUED] telmisartan (MICARDIS) 40 MG tablet Take 20 mg by mouth daily. Take 1/2 tablet (10 mg) daily.  . [DISCONTINUED] traZODone (DESYREL) 50 MG tablet TAKE 1 OR 2 TABLETS AT BEDTIME AS NEEDED FOR SLEEP. (Patient not taking: No sig reported)   No facility-administered encounter medications on file as of 01/23/2018.     PAST MEDICAL HISTORY:   Past Medical History:  Diagnosis Date  . Allergy    perennial  . Anxiety   . Arthritis   . Cholelithiasis   . CKD (chronic kidney disease)   . Depression   . Diverticulosis   . Enlarged prostate   . Gallstones   . GERD (gastroesophageal reflux disease)   . HLD (hyperlipidemia)   . Hx of cardiovascular stress test    ETT-Myoview 6/14: probable low risk  study, inf defect with normal inf wall motion-cannot r/o ischemia; prominent gut uptake adjacent to inf wall on rest images-poss defect is atten, EF 63%  . Hypertension   . Melanoma ([place])   . Polycystic kidney disease     PAST SURGICAL HISTORY:   Past Surgical History:  Procedure Laterality Date  . COLONOSCOPY  2011   diverticulosis  . HERNIA REPAIR     umbilical  . KNEE SURGERY Left   . medialization laryngoplasty  10/15/2013   Dr Joya Gaskins, [facility]  . TONSILLECTOMY AND ADENOIDECTOMY      SOCIAL HISTORY:   Social History   Socioeconomic History  . Marital status: Married    Spouse name: Not on file  . Number of children: 2  . Years of education: Not on file  . Highest education level: Not on file  Occupational History  . 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 Emergency planning/management officer  strain: Not on file  . Food insecurity:    Worry: Not on file    Inability: Not on file  . Transportation needs:    Medical: Not on file    Non-medical: Not on file  Tobacco Use  . Smoking status: Never Smoker  . Smokeless tobacco: Never Used  Substance and Sexual Activity  . Alcohol use: Yes    Alcohol/week: 0.0 standard drinks    Comment:  2-3 beers / month  . Drug use: No  . Sexual activity: Not on file  Lifestyle  . Physical activity:    Days per week: Not on file    Minutes per session: Not on file  . Stress: Not on file  Relationships  . Social connections:    Talks on phone: Not on file    Gets together: Not on file    Attends religious service: Not on file    Active member of club or organization: Not on file    Attends meetings of clubs or organizations: Not on file    Relationship status: Not on file  . Intimate partner violence:    Fear of current or ex partner: Not on file    Emotionally abused: Not on file    Physically abused: Not on file    Forced sexual activity: Not on file  Other Topics Concern  . Not on file    Social History Narrative  . Not on file    FAMILY HISTORY:   Family Status  Relation Name Status  . Mother  Deceased       heart disease, dementia  . Father  Deceased       HTN, ulcers  . Sister  Deceased       breast cancer  . Brother Lavone Neri Deceased       cancer tongue/cheek, heart disease  . Sister  Deceased       stroke, GERD  . Brother  Alive       DJD  . Child  Alive       2 daughters, breast CA hx  . Sister Dorthory (Not Specified)  . Brother  (Not Specified)  . Daughter  (Not Specified)  . Sister  (Not Specified)  . Brother  (Not Specified)  . Neg Hx  (Not Specified)    ROS:   Review of Systems  Constitutional: Positive for malaise/fatigue.  HENT: Negative.   Eyes: Negative.   Respiratory: Negative.   Cardiovascular: Positive for leg swelling.  Gastrointestinal: Negative.   Genitourinary: Negative.   Skin: Negative.   Neurological: Positive for dizziness.  Psychiatric/Behavioral: Positive for depression.    PHYSICAL EXAMINATION:    VITALS:   Vitals:   01/23/18 1334  BP: 102/64  Pulse: 74  SpO2: 90%  Weight: 160 lb (72.6 kg)  Height: 5\' 11"  (1.803 m)     GEN:  The patient appears stated age and is in NAD. HEENT:  Normocephalic, atraumatic.  The mucous membranes are moist. The superficial temporal arteries are without ropiness or tenderness.  There are no tongue fasciculations. CV:  RRR Lungs:  CTAB Neck/HEME:  There are no carotid bruits bilaterally.  Neurological examination:  Orientation: Patient is alert and oriented x3.   Montreal Cognitive Assessment  02/04/2017  Visuospatial/ Executive (0/5) 3  Naming (0/3) 1  Attention: Read list of digits (0/2) 2  Attention: Read list of letters (0/1) 1  Attention: Serial 7 subtraction starting at 100 (0/3) 1  Language: Repeat phrase (0/2) 2  Language : Fluency (0/1) 0  Abstraction (0/2) 2  Delayed Recall (0/5) 2  Orientation (0/6) 6  Total 20  Adjusted Score (based on education) 20     Cranial nerves: There is good facial symmetry.  There is facial hypomimia.  The speech is fluent and clear. Soft palate rises symmetrically and there is no tongue deviation. Hearing is intact to conversational tone. Sensation: Sensation is intact to light touch throughout Motor: Strength is 5/5 in the bilateral upper and lower extremities.   Shoulder shrug is equal and symmetric.  There is no pronator drift.  Movement examination: Tone: There is normal tone in the bilateral upper extremities. Abnormal movements: None Coordination:  There is no decremation, with any form of RAMS, including alternating supination and pronation of the forearm, hand opening and closing, finger taps, heel taps and toe taps. Gait and Station: The patient has no difficulty arising out of a deep-seated chair without the use of the hands. The patient's stride length is good today with just slight decrease in arm swing.   Lab Results  Component Value Date   TSH 1.64 11/12/2017   Lab Results  Component Value Date   VITAMINB12 361 11/14/2016      Chemistry      Component Value Date/Time   NA 134 (L) 11/12/2017 1413   K 3.5 11/12/2017 1413   CL 98 11/12/2017 1413   CO2 31 11/12/2017 1413   BUN 22 11/12/2017 1413   CREATININE 1.59 (H) 11/12/2017 1413      Component Value Date/Time   CALCIUM 8.8 11/12/2017 1413   ALKPHOS 59 11/12/2017 1413   AST 14 11/12/2017 1413   ALT 8 11/12/2017 1413   BILITOT 0.9 11/12/2017 1413          ASSESSMENT/PLAN:  1.  Memory loss  -He had neurocognitive testing in 05/2017 and no evidence of dementia.  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  It has gotten worse with wifes transition to memory care living and he is now living with daughter.    -he is now seeing a counselor  2.  Peripheral neuropathy  -The patient has clinical examination evidence of a diffuse peripheral neuropathy, which certainly can affect gait and balance.  We discussed  safety associated with peripheral neuropathy.  We discussed balance therapy and the importance of ambulatory assistive device for balance assistance.  -on b12 supplement   3.  Parkinsonism   -He will continue carbidopa/levodopa 25/100, 1 tablet at 7 AM/11 AM/3 PM/ and increase 7 PM to 1.5 tablets.   -change back q hs carbidopa/levodopa 50/200 to 25/100 CR.  Pt thinks contributes to AM dizziness and didn't see that the increase helped RLS  -PT referral  -discussed importance of regular daily schedule.  4.  Depression  -may need to raise wellbutrin xl back to 300 mg daily.  Didn't do that myself because changing other meds today.  Pt is not SI.  Encouraged him to verbalize feelings to daughter/counselor.   5.RLS  -d/c valium  -add klonopin - 0.5 mg - 1/2 tablet at night.  Risks, benefits, side effects and alternative therapies were discussed.  The opportunity to ask questions was given and they were answered to the best of my ability.  The patient expressed understanding and willingness to follow the outlined treatment protocols.  -hold on starting rozerem for now  6.  F/u at previously scheduled appt in march.  Much greater than 50% of this visit was spent in  counseling and coordinating care.  Total face to face time:  30 min

## 2018-01-29 ENCOUNTER — Telehealth: Payer: Self-pay

## 2018-01-29 NOTE — Telephone Encounter (Signed)
PA started on CoverMyMeds KEY: [id number]

## 2018-01-30 ENCOUNTER — Encounter: Payer: Self-pay | Admitting: Physical Therapy

## 2018-01-30 ENCOUNTER — Ambulatory Visit: Payer: Medicare Other | Attending: Neurology | Admitting: Physical Therapy

## 2018-01-30 ENCOUNTER — Telehealth: Payer: Self-pay

## 2018-01-30 DIAGNOSIS — R293 Abnormal posture: Secondary | ICD-10-CM | POA: Diagnosis not present

## 2018-01-30 DIAGNOSIS — R29818 Other symptoms and signs involving the nervous system: Secondary | ICD-10-CM | POA: Diagnosis not present

## 2018-01-30 DIAGNOSIS — R2689 Other abnormalities of gait and mobility: Secondary | ICD-10-CM | POA: Diagnosis not present

## 2018-01-30 DIAGNOSIS — R2681 Unsteadiness on feet: Secondary | ICD-10-CM | POA: Insufficient documentation

## 2018-01-30 NOTE — Telephone Encounter (Signed)
Copied from [place] [id number]. Topic: General - Other >> Jan 30, 2018  2:36 PM Carolyn Stare wrote:  Nicki Reaper with [employer] call to say  ramelteon (ROZEREM) 8 MG tablet   is not covered under pt formulary drugs  He did say these 2 are covered TEMAZEPAM 15MG  OR 30MG  CAPSULES     OR SILENOR AND ZALEPLON >> Jan 30, 2018  2:44 PM Para Skeans A wrote: Dr.Crawford call this medication in.

## 2018-01-30 NOTE — Telephone Encounter (Signed)
I would recommend that PCP decide which they would prefer. None of these I feel are very safe for him and would recommend trying melatonin over the counter instead.

## 2018-01-31 NOTE — Therapy (Signed)
[facility] [address] [place], [place], [address] Phone: [phone]   Fax:  [phone]  Physical Therapy EVALUATION  Patient Details  Name: Henry Adams MRN: [id number] Date of Birth: [birth year] Referring Provider (PT): Tat, Wells Guiles   Encounter Date: 01/30/2018  PT End of Session - 01/31/18 1312    Visit Number  1    Number of Visits  13    Date for PT Re-Evaluation  04/01/18    Authorization Type  BCBS Medicare-will need 10th visit progress note    PT Start Time  0849    PT Stop Time  0929    PT Time Calculation (min)  40 min    Activity Tolerance  Patient tolerated treatment well    Behavior During Therapy  [facility] for tasks assessed/performed       Past Medical History:  Diagnosis Date  . Allergy    perennial  . Anxiety   . Arthritis   . Cholelithiasis   . CKD (chronic kidney disease)   . Depression   . Diverticulosis   . Enlarged prostate   . Gallstones   . GERD (gastroesophageal reflux disease)   . HLD (hyperlipidemia)   . Hx of cardiovascular stress test    ETT-Myoview 6/14: probable low risk study, inf defect with normal inf wall motion-cannot r/o ischemia; prominent gut uptake adjacent to inf wall on rest images-poss defect is atten, EF 63%  . Hypertension   . Melanoma ([place])   . Polycystic kidney disease     Past Surgical History:  Procedure Laterality Date  . COLONOSCOPY  2011   diverticulosis  . HERNIA REPAIR     umbilical  . KNEE SURGERY Left   . medialization laryngoplasty  10/15/2013   Dr Joya Gaskins, [facility]  . TONSILLECTOMY AND ADENOIDECTOMY      There were no vitals filed for this visit.  Subjective Assessment - 01/30/18 0851    Subjective  Pt reports he has Parkinson's, and taking medications for that.  Notice occasionally that I shuffle, and feel I don't walk the way I should sometimes.  Had one fall going down the steps.  Have a walker, but do not use.    Pertinent History   neuropathy, polycystic kidney    Patient Stated Goals  Pt's goals are to get rid of some of the Parkinson's symptoms.    Currently in Pain?  Yes    Pain Score  4     Pain Location  --   stomach   Pain Orientation  Right;Lower    Pain Descriptors / Indicators  --   Irritation   Pain Type  Chronic pain    Pain Onset  More than a month ago    Pain Frequency  Intermittent    Aggravating Factors   eating    Pain Relieving Factors  unsure    Effect of Pain on Daily Activities  PT will monitor, but will not address as a goal at this time.         [facility] PT Assessment - 01/30/18 0900      Assessment   Medical Diagnosis  Parkinson's disease    Referring Provider (PT)  Tat, Wells Guiles    Onset Date/Surgical Date  01/23/18   MD visit     Precautions   Precautions  Fall      Balance Screen   Has the patient fallen in the past 6 months  Yes    How many times?  1    Has the patient had a decrease in activity level because of a fear of falling?   No    Is the patient reluctant to leave their home because of a fear of falling?   No      Home Film/video editor residence    Living Arrangements  Spouse/significant other   Spouse with dementia at [place] at Discharge  Family   Daughters   Type of [place]  One level    [place] - 2 wheels;[place] - single point      Prior Function   Level of Independence  Independent   Was caring for wife with dementia   Leisure  Enjoys photography and travelling, basketball games, and going to grandchildren's activities      Cognition   Memory  --   Word finding difficulty during PT eval     Observation/Other Assessments   Focus on Therapeutic Outcomes (FOTO)   NA      Posture/Postural Control   Posture/Postural Control  Postural limitations    Postural Limitations  Forward head;Rounded Shoulders      ROM /  Strength   AROM / PROM / Strength  Strength      Strength   Overall Strength  Within functional limits for tasks performed      Transfers   Transfers  Sit to Stand;Stand to Sit    Sit to Stand  6: Modified independent (Device/Increase time);Without upper extremity assist;From chair/3-in-1    Five time sit to stand comments   10.46    Stand to Sit  6: Modified independent (Device/Increase time);Without upper extremity assist;To chair/3-in-1    Comments  Reports more difficulty getting up from sofa      Ambulation/Gait   Ambulation/Gait  Yes    Ambulation/Gait Assistance  6: Modified independent (Device/Increase time)    Ambulation Distance (Feet)  200 Feet    Assistive device  None    Gait Pattern  Step-through pattern;Decreased step length - right;Decreased step length - left;Decreased trunk rotation;Trunk flexed;Poor foot clearance - left;Poor foot clearance - right    Ambulation Surface  Level;Indoor    Gait velocity  9.66 sec = 3.4 ft/sec      Standardized Balance Assessment   Standardized Balance Assessment  Timed Up and Go Test;Dynamic Gait Index      Dynamic Gait Index   Level Surface  Mild Impairment    Change in Gait Speed  Normal    Gait with Horizontal Head Turns  Mild Impairment    Gait with Vertical Head Turns  Mild Impairment    Gait and Pivot Turn  Mild Impairment   3.44 sec; pivots only   Step Over Obstacle  Moderate Impairment    Step Around Obstacles  Mild Impairment    Steps  Mild Impairment    Total Score  16    DGI comment:  Scores <19/24 indicate increased fall risk      Timed Up and Go Test   Normal TUG (seconds)  13.44    Manual TUG (seconds)  12.66    Cognitive TUG (seconds)  15.32   stopped counting   TUG Comments  Scores >10% difference TUG and TUG cognitive indicates increased fall risk/difficulty with dual tasking  PT Short Term Goals - 01/31/18 1324      PT SHORT TERM GOAL #1   Title  Pt  will be independent with HEP for improved balance and mobility.  TARGET 02/28/2018    Time  4    Period  Weeks    Status  New    Target Date  02/28/18      PT SHORT TERM GOAL #2   Title  Pt will perform sit<>stand 8 of 10 reps, from <18" surfaces with minimal to no UE support, for improved low surface transfers.    Time  4    Period  Weeks    Status  New    Target Date  02/28/18      PT SHORT TERM GOAL #3   Title  Pt will improve TUG and TUG cognitive score to less than 10% difference, for improved dual tasking with gait.    Time  4    Period  Weeks    Status  New    Target Date  02/28/18        PT Long Term Goals - 01/31/18 1327      PT LONG TERM GOAL #1   Title  Pt will verbalize understanding of fall prevention in home environment, including tips to reduce freezing episodes with gait.  TARGET 03/14/2018    Time  6    Period  Weeks    Status  New    Target Date  03/14/18      PT LONG TERM GOAL #2   Title  Pt will improve Dynamic Gait Index score to at least 19/24 for decreased fall risk.    Time  6    Period  Weeks    Status  New    Target Date  03/14/18      PT LONG TERM GOAL #3   Title  Pt will verbalize plans for continued community fitness upon d/c from PT.    Time  6    Period  Weeks    Status  New    Target Date  03/14/18      PT LONG TERM GOAL #4   Title  Pt will verbalize understanding of local Parkinson's disease resources.    Time  6    Period  Weeks    Status  New    Target Date  03/14/18            Plan - 01/31/18 1313    Clinical Impression Statement  Pt is a 78 year old male with history of Parkinson's disease, who presents to OPPT with decreased balance, decreased timing and coordination of gait, hx of 1 reported fall coming down the steps.  He has been primary caregiver for wife who has dementia who was recently transferred to memory care unit, and he enjoys going to grandchildren's activities.  He would benefit from skilled PT to address  the above stated deficits to decrease fall risk and improve functional mobility.    History and Personal Factors relevant to plan of care:  Hx of Parkinson's disease, 1 fall    Clinical Presentation  Evolving    Clinical Presentation due to:  PD as neurodegenerative disease    Clinical Decision Making  Moderate    Rehab Potential  Good    PT Frequency  2x / week    PT Duration  6 weeks   plus eval   PT Treatment/Interventions  ADLs/Self Care Home Management;Therapeutic exercise;Therapeutic activities;Functional mobility training;Gait training;Balance training;Neuromuscular  re-education;Patient/family education    PT Next Visit Plan  Initiate HEP for balance, PWR! Moves in sitting, standing; fall prevention   Please note pt is scheduled from 7 weeks, POC is for 6 weeks   Consulted and Agree with Plan of Care  Patient       Patient will benefit from skilled therapeutic intervention in order to improve the following deficits and impairments:  Abnormal gait, Decreased balance, Decreased mobility, Difficulty walking, Postural dysfunction  Visit Diagnosis: Other abnormalities of gait and mobility  Unsteadiness on feet  Abnormal posture  Other symptoms and signs involving the nervous system     Problem List Patient Active Problem List   Diagnosis Date Noted  . Restless leg syndrome 01/23/2018  . Abdominal distension 12/25/2017  . Calculus of gallbladder with chronic cholecystitis without obstruction 09/18/2017  . Chronic idiopathic constipation 08/19/2017  . Abdominal wall mass of right flank 08/19/2017  . Age-related cognitive decline 01/09/2017  . Anxiety and depression 10/24/2016  . Insomnia 10/24/2016  . BPH associated with nocturia 08/11/2015  . Routine general medical examination at a health care facility 08/11/2015  . Vocal cord atrophy 09/01/2013  . Essential hypertension 04/26/2007  . Allergic rhinitis 04/26/2007  . DEGENERATIVE JOINT DISEASE 04/26/2007  . G E R D  08/05/2006  . Helicobacter pylori infection 05/30/2006  . Hyperlipidemia with target LDL less than 160 05/30/2006  . Polycystic kidney 05/30/2006    Glenisha Gundry W. 01/31/2018, 1:30 PM  Frazier Butt., PT   [facility] [address] [place], [place], [address] Phone: [phone]   Fax:  [phone]  Name: Henry Adams MRN: [id number] Date of Birth: 03/18/1940

## 2018-01-31 NOTE — Addendum Note (Signed)
Addended by: Frazier Butt on: 01/31/2018 01:33 PM   Modules accepted: Orders

## 2018-02-03 ENCOUNTER — Other Ambulatory Visit: Payer: Self-pay | Admitting: Internal Medicine

## 2018-02-03 DIAGNOSIS — F5101 Primary insomnia: Secondary | ICD-10-CM

## 2018-02-03 MED ORDER — ZALEPLON 5 MG PO CAPS: 5.0000 mg | ORAL_CAPSULE | Freq: Every evening | ORAL | 1 refills | Status: DC | PRN

## 2018-02-05 ENCOUNTER — Ambulatory Visit: Payer: Medicare Other | Admitting: Physical Therapy

## 2018-02-05 DIAGNOSIS — R2689 Other abnormalities of gait and mobility: Secondary | ICD-10-CM

## 2018-02-05 DIAGNOSIS — R29818 Other symptoms and signs involving the nervous system: Secondary | ICD-10-CM | POA: Diagnosis not present

## 2018-02-05 DIAGNOSIS — R293 Abnormal posture: Secondary | ICD-10-CM

## 2018-02-05 DIAGNOSIS — R2681 Unsteadiness on feet: Secondary | ICD-10-CM | POA: Diagnosis not present

## 2018-02-05 NOTE — Therapy (Signed)
[facility] [address] [place], [place], [address] Phone: [phone]   Fax:  [phone]  Physical Therapy Treatment  Patient Details  Name: Henry Adams MRN: [id number] Date of Birth: Oct 11, 1940 Referring Provider (PT): Tat, Wells Guiles   Encounter Date: 02/05/2018  PT End of Session - 02/05/18 1248    Visit Number  2    Number of Visits  13    Date for PT Re-Evaluation  04/01/18    Authorization Type  BCBS Medicare-will need 10th visit progress note    PT Start Time  0849    PT Stop Time  0931    PT Time Calculation (min)  42 min    Activity Tolerance  Patient tolerated treatment well    Behavior During Therapy  [facility] for tasks assessed/performed       Past Medical History:  Diagnosis Date  . Allergy    perennial  . Anxiety   . Arthritis   . Cholelithiasis   . CKD (chronic kidney disease)   . Depression   . Diverticulosis   . Enlarged prostate   . Gallstones   . GERD (gastroesophageal reflux disease)   . HLD (hyperlipidemia)   . Hx of cardiovascular stress test    ETT-Myoview 6/14: probable low risk study, inf defect with normal inf wall motion-cannot r/o ischemia; prominent gut uptake adjacent to inf wall on rest images-poss defect is atten, EF 63%  . Hypertension   . Melanoma ([place])   . Polycystic kidney disease     Past Surgical History:  Procedure Laterality Date  . COLONOSCOPY  2011   diverticulosis  . HERNIA REPAIR     umbilical  . KNEE SURGERY Left   . medialization laryngoplasty  10/15/2013   Dr Joya Gaskins, [facility]  . TONSILLECTOMY AND ADENOIDECTOMY      There were no vitals filed for this visit.  Subjective Assessment - 02/05/18 0854    Pertinent History  neuropathy, polycystic kidney    Patient Stated Goals  Pt's goals are to get rid of some of the Parkinson's symptoms.    Currently in Pain?  Yes    Pain Score  4     Pain Location  Other (Comment)   stomach   Pain Orientation   Right;Lower    Pain Descriptors / Indicators  --   irritation   Pain Onset  More than a month ago    Aggravating Factors   eating    Pain Relieving Factors  unsure         [facility] Adult PT Treatment/Exercise - 02/05/18 0001      Knee/Hip Exercises: Aerobic   Other Aerobic  Scifit level 1.5 all 4 extremities x 5 minutes-requested a rest break after 5 minutes  (Pended)         PWR [facility]) - 02/05/18 1245    PWR! exercises  Moves in sitting;Hands    PWR! Up  x 10    Comments  Flick x 10    PWR! Up  x 20    PWR! Rock  x 20    Comments  Pt needs constant cues to maintain proper technique with all moves.  Pt reports having some discomfort at times in lower abdomen due to "kidney issues" and constipation so adapted PWR moves as needed.  Educated on purpose and goals of PWR moves.          PT Education - 02/05/18 1248    Education Details  Purpose and  goals of PWR moves    Person(s) Educated  Patient    Methods  Explanation;Demonstration;Verbal cues    Comprehension  Need further instruction       PT Short Term Goals - 01/31/18 1324      PT SHORT TERM GOAL #1   Title  Pt will be independent with HEP for improved balance and mobility.  TARGET 02/28/2018    Time  4    Period  Weeks    Status  New    Target Date  02/28/18      PT SHORT TERM GOAL #2   Title  Pt will perform sit<>stand 8 of 10 reps, from <18" surfaces with minimal to no UE support, for improved low surface transfers.    Time  4    Period  Weeks    Status  New    Target Date  02/28/18      PT SHORT TERM GOAL #3   Title  Pt will improve TUG and TUG cognitive score to less than 10% difference, for improved dual tasking with gait.    Time  4    Period  Weeks    Status  New    Target Date  02/28/18        PT Long Term Goals - 01/31/18 1327      PT LONG TERM GOAL #1   Title  Pt will verbalize understanding of fall prevention in home environment, including tips to reduce freezing episodes with gait.  TARGET  03/14/2018    Time  6    Period  Weeks    Status  New    Target Date  03/14/18      PT LONG TERM GOAL #2   Title  Pt will improve Dynamic Gait Index score to at least 19/24 for decreased fall risk.    Time  6    Period  Weeks    Status  New    Target Date  03/14/18      PT LONG TERM GOAL #3   Title  Pt will verbalize plans for continued community fitness upon d/c from PT.    Time  6    Period  Weeks    Status  New    Target Date  03/14/18      PT LONG TERM GOAL #4   Title  Pt will verbalize understanding of local Parkinson's disease resources.    Time  6    Period  Weeks    Status  New    Target Date  03/14/18            Plan - 02/05/18 1249    Clinical Impression Statement  Skilled session focused on Scifit for warmup and enduarance and initiating PWR moves.  Pt needs constant cues with exercises to maintain proper technique.  Pt limited by c/o abdominal discomfort due to "kidneys and constipation".  Continue PT per POC.    Rehab Potential  Good    PT Frequency  2x / week    PT Duration  6 weeks   plus eval   PT Treatment/Interventions  ADLs/Self Care Home Management;Therapeutic exercise;Therapeutic activities;Functional mobility training;Gait training;Balance training;Neuromuscular re-education;Patient/family education    PT Next Visit Plan  PWR moves seated and possibly give as HEP, PWR hands.  PD education. Initiate HEP for balance, PWR! Moves in standing; fall prevention   Please note pt is scheduled from 7 weeks, POC is for 6 weeks   Consulted and Agree with Plan  of Care  Patient       Patient will benefit from skilled therapeutic intervention in order to improve the following deficits and impairments:  Abnormal gait, Decreased balance, Decreased mobility, Difficulty walking, Postural dysfunction  Visit Diagnosis: Other abnormalities of gait and mobility  Unsteadiness on feet  Abnormal posture  Other symptoms and signs involving the nervous  system     Problem List Patient Active Problem List   Diagnosis Date Noted  . Restless leg syndrome 01/23/2018  . Abdominal distension 12/25/2017  . Calculus of gallbladder with chronic cholecystitis without obstruction 09/18/2017  . Chronic idiopathic constipation 08/19/2017  . Age-related cognitive decline 01/09/2017  . Anxiety and depression 10/24/2016  . Insomnia 10/24/2016  . BPH associated with nocturia 08/11/2015  . Routine general medical examination at a health care facility 08/11/2015  . Vocal cord atrophy 09/01/2013  . Essential hypertension 04/26/2007  . Allergic rhinitis 04/26/2007  . DEGENERATIVE JOINT DISEASE 04/26/2007  . G E R D 08/05/2006  . Helicobacter pylori infection 05/30/2006  . Hyperlipidemia with target LDL less than 160 05/30/2006  . Polycystic kidney 05/30/2006    Narda Bonds, PTA [place] 02/05/18 12:52 PM Phone: [phone] Fax: [facility] [place] [address] [place], [place], [address] Phone: [phone]   Fax:  [phone]  Name: Henry Adams MRN: [id number] Date of Birth: March 05, 1940

## 2018-02-07 ENCOUNTER — Encounter: Payer: Self-pay | Admitting: Physical Therapy

## 2018-02-07 ENCOUNTER — Ambulatory Visit: Payer: Medicare Other | Admitting: Physical Therapy

## 2018-02-07 DIAGNOSIS — R29818 Other symptoms and signs involving the nervous system: Secondary | ICD-10-CM | POA: Diagnosis not present

## 2018-02-07 DIAGNOSIS — R293 Abnormal posture: Secondary | ICD-10-CM | POA: Diagnosis not present

## 2018-02-07 DIAGNOSIS — R2689 Other abnormalities of gait and mobility: Secondary | ICD-10-CM | POA: Diagnosis not present

## 2018-02-07 DIAGNOSIS — R2681 Unsteadiness on feet: Secondary | ICD-10-CM | POA: Diagnosis not present

## 2018-02-07 NOTE — Patient Instructions (Signed)
Fall Prevention in the Home, Adult  Falls can cause injuries and can affect people from all age groups. There are many simple things that you can do to make your home safe and to help prevent falls. Ask for help when making these changes, if needed.  What actions can I take to prevent falls?  General instructions  · Use good lighting in all rooms. Replace any light bulbs that burn out.  · Turn on lights if it is dark. Use night-lights.  · Place frequently used items in easy-to-reach places. Lower the shelves around your home if necessary.  · Set up furniture so that there are clear paths around it. Avoid moving your furniture around.  · Remove throw rugs and other tripping hazards from the floor.  · Avoid walking on wet floors.  · Fix any uneven floor surfaces.  · Add color or contrast paint or tape to grab bars and handrails in your home. Place contrasting color strips on the first and last steps of stairways.  · When you use a stepladder, make sure that it is completely opened and that the sides are firmly locked. Have someone hold the ladder while you are using it. Do not climb a closed stepladder.  · Be aware of any and all pets.  What can I do in the bathroom?         · Keep the floor dry. Immediately clean up any water that spills onto the floor.  · Remove soap buildup in the tub or shower on a regular basis.  · Use non-skid mats or decals on the floor of the tub or shower.  · Attach bath mats securely with double-sided, non-slip rug tape.  · If you need to sit down while you are in the shower, use a plastic, non-slip stool.  · Install grab bars by the toilet and in the tub and shower. Do not use towel bars as grab bars.  What can I do in the bedroom?  · Make sure that a bedside light is easy to reach.  · Do not use oversized bedding that drapes onto the floor.  · Have a firm chair that has side arms to use for getting dressed.  What can I do in the kitchen?  · Clean up any spills right away.  · If you need to  reach for something above you, use a sturdy step stool that has a grab bar.  · Keep electrical cables out of the way.  · Do not use floor polish or wax that makes floors slippery. If you must use wax, make sure that it is non-skid floor wax.  What can I do in the stairways?  · Do not leave any items on the stairs.  · Make sure that you have a light switch at the top of the stairs and the bottom of the stairs. Have them installed if you do not have them.  · Make sure that there are handrails on both sides of the stairs. Fix handrails that are broken or loose. Make sure that handrails are as long as the stairways.  · Install non-slip stair treads on all stairs in your home.  · Avoid having throw rugs at the top or bottom of stairways, or secure the rugs with carpet tape to prevent them from moving.  · Choose a carpet design that does not hide the edge of steps on the stairway.  · Check any carpeting to make sure that it is firmly attached   to the stairs. Fix any carpet that is loose or worn.  What can I do on the outside of my home?  · Use bright outdoor lighting.  · Regularly repair the edges of walkways and driveways and fix any cracks.  · Remove high doorway thresholds.  · Trim any shrubbery on the main path into your home.  · Regularly check that handrails are securely fastened and in good repair. Both sides of any steps should have handrails.  · Install guardrails along the edges of any raised decks or porches.  · Clear walkways of debris and clutter, including tools and rocks.  · Have leaves, snow, and ice cleared regularly.  · Use sand or salt on walkways during winter months.  · In the garage, clean up any spills right away, including grease or oil spills.  What other actions can I take?  · Wear closed-toe shoes that fit well and support your feet. Wear shoes that have rubber soles or low heels.  · Use mobility aids as needed, such as canes, walkers, scooters, and crutches.  · Review your medicines with your  health care provider. Some medicines can cause dizziness or changes in blood pressure, which increase your risk of falling.  Talk with your health care provider about other ways that you can decrease your risk of falls. This may include working with a physical therapist or trainer to improve your strength, balance, and endurance.  Where to find more information  · Centers for Disease Control and Prevention, [employer]: https://www.cdc.gov  · National Institute on Aging: https://go4life.nia.nih.gov  Contact a health care provider if:  · You are afraid of falling at home.  · You feel weak, drowsy, or dizzy at home.  · You fall at home.  Summary  · There are many simple things that you can do to make your home safe and to help prevent falls.  · Ways to make your home safe include removing tripping hazards and installing grab bars in the bathroom.  · Ask for help when making these changes in your home.  This information is not intended to replace advice given to you by your health care provider. Make sure you discuss any questions you have with your health care provider.  Document Released: 12/29/2001 Document Revised: 08/23/2016 Document Reviewed: 08/23/2016  Elsevier Interactive Patient Education © 2019 [employer]

## 2018-02-07 NOTE — Therapy (Signed)
[place] [address] [place], [place], [address] Phone: [phone]   Fax:  [phone]  Physical Therapy Treatment  Patient Details  Name: Henry Adams MRN: [id number] Date of Birth: 09-09-1940 Referring Provider (PT): Tat, Wells Guiles   Encounter Date: 02/07/2018  PT End of Session - 02/07/18 1255    Visit Number  3    Number of Visits  13    Date for PT Re-Evaluation  04/01/18    Authorization Type  BCBS Medicare-will need 10th visit progress note    PT Start Time  1148    PT Stop Time  1231    PT Time Calculation (min)  43 min    Activity Tolerance  Patient tolerated treatment well    Behavior During Therapy  [facility] for tasks assessed/performed       Past Medical History:  Diagnosis Date  . Allergy    perennial  . Anxiety   . Arthritis   . Cholelithiasis   . CKD (chronic kidney disease)   . Depression   . Diverticulosis   . Enlarged prostate   . Gallstones   . GERD (gastroesophageal reflux disease)   . HLD (hyperlipidemia)   . Hx of cardiovascular stress test    ETT-Myoview 6/14: probable low risk study, inf defect with normal inf wall motion-cannot r/o ischemia; prominent gut uptake adjacent to inf wall on rest images-poss defect is atten, EF 63%  . Hypertension   . Melanoma ([place])   . Polycystic kidney disease     Past Surgical History:  Procedure Laterality Date  . COLONOSCOPY  2011   diverticulosis  . HERNIA REPAIR     umbilical  . KNEE SURGERY Left   . medialization laryngoplasty  10/15/2013   Dr Joya Gaskins, [facility]  . TONSILLECTOMY AND ADENOIDECTOMY      There were no vitals filed for this visit.  Subjective Assessment - 02/07/18 1153    Subjective  Denies falls or changes.  Did have some dizziness yesterday but resolved today.    Pertinent History  neuropathy, polycystic kidney    Patient Stated Goals  Pt's goals are to get rid of some of the Parkinson's symptoms.    Currently in Pain?  Yes     Pain Score  4     Pain Location  Other (Comment)   stomach   Pain Orientation  Right;Lower    Pain Descriptors / Indicators  --   irritation   Pain Type  Chronic pain    Pain Onset  More than a month ago    Pain Frequency  Intermittent    Aggravating Factors   eating    Pain Relieving Factors  unsure         [facility] Adult PT Treatment/Exercise - 02/07/18 0001      Knee/Hip Exercises: Aerobic   Other Aerobic  Scifit level 1.5 all 4 extremities x 6 minutes with rpm>50        PT Education - 02/07/18 1253    Education Details  PWR moves in sitting, YouTube for [employer] moves as reinforcement, Research officer, political party) Educated  Patient    Methods  Explanation;Demonstration;Verbal cues;Handout    Comprehension  Verbalized understanding;Other (comment)   needs continued reinforcement      PT Short Term Goals - 01/31/18 1324      PT SHORT TERM GOAL #1   Title  Pt will be independent with HEP for improved balance and mobility.  TARGET 02/28/2018  Time  4    Period  Weeks    Status  New    Target Date  02/28/18      PT SHORT TERM GOAL #2   Title  Pt will perform sit<>stand 8 of 10 reps, from <18" surfaces with minimal to no UE support, for improved low surface transfers.    Time  4    Period  Weeks    Status  New    Target Date  02/28/18      PT SHORT TERM GOAL #3   Title  Pt will improve TUG and TUG cognitive score to less than 10% difference, for improved dual tasking with gait.    Time  4    Period  Weeks    Status  New    Target Date  02/28/18        PT Long Term Goals - 01/31/18 1327      PT LONG TERM GOAL #1   Title  Pt will verbalize understanding of fall prevention in home environment, including tips to reduce freezing episodes with gait.  TARGET 03/14/2018    Time  6    Period  Weeks    Status  New    Target Date  03/14/18      PT LONG TERM GOAL #2   Title  Pt will improve Dynamic Gait Index score to at least 19/24 for decreased fall risk.    Time  6     Period  Weeks    Status  New    Target Date  03/14/18      PT LONG TERM GOAL #3   Title  Pt will verbalize plans for continued community fitness upon d/c from PT.    Time  6    Period  Weeks    Status  New    Target Date  03/14/18      PT LONG TERM GOAL #4   Title  Pt will verbalize understanding of local Parkinson's disease resources.    Time  6    Period  Weeks    Status  New    Target Date  03/14/18            Plan - 02/07/18 1255    Clinical Impression Statement  Skilled session focused on endurance and developing HEP.  Pt needs moderate verbal cues and reinforcement with exercises.  Did provide seated PWR moves as HEP and showed pt video instruction available on You Tube as reinforcement at home.  Continues to need cues for intensity with all mobility.  Continue PT per POC.    Rehab Potential  Good    PT Frequency  2x / week    PT Duration  6 weeks   plus eval   PT Treatment/Interventions  ADLs/Self Care Home Management;Therapeutic exercise;Therapeutic activities;Functional mobility training;Gait training;Balance training;Neuromuscular re-education;Patient/family education    PT Next Visit Plan  Review PWR moves seated.  Instruct and give PWR hands as HEP.  Possiblty add standing PWR moves.  PD education.   Please note pt is scheduled from 7 weeks, POC is for 6 weeks   Consulted and Agree with Plan of Care  Patient       Patient will benefit from skilled therapeutic intervention in order to improve the following deficits and impairments:  Abnormal gait, Decreased balance, Decreased mobility, Difficulty walking, Postural dysfunction  Visit Diagnosis: Other abnormalities of gait and mobility  Unsteadiness on feet  Abnormal posture  Other symptoms and signs involving  the nervous system     Problem List Patient Active Problem List   Diagnosis Date Noted  . Restless leg syndrome 01/23/2018  . Abdominal distension 12/25/2017  . Calculus of gallbladder with  chronic cholecystitis without obstruction 09/18/2017  . Chronic idiopathic constipation 08/19/2017  . Age-related cognitive decline 01/09/2017  . Anxiety and depression 10/24/2016  . Insomnia 10/24/2016  . BPH associated with nocturia 08/11/2015  . Routine general medical examination at a health care facility 08/11/2015  . Vocal cord atrophy 09/01/2013  . Essential hypertension 04/26/2007  . Allergic rhinitis 04/26/2007  . DEGENERATIVE JOINT DISEASE 04/26/2007  . G E R D 08/05/2006  . Helicobacter pylori infection 05/30/2006  . Hyperlipidemia with target LDL less than 160 05/30/2006  . Polycystic kidney 05/30/2006    Narda Bonds, PTA [place] 02/07/18 12:59 PM Phone: [phone] Fax: [place] [address] [place], [place], [address] Phone: [phone]   Fax:  [phone]  Name: Henry Adams MRN: [id number] Date of Birth: May 17, 1940

## 2018-02-12 ENCOUNTER — Encounter: Payer: Self-pay | Admitting: Physical Therapy

## 2018-02-12 ENCOUNTER — Ambulatory Visit: Payer: Medicare Other | Admitting: Physical Therapy

## 2018-02-12 DIAGNOSIS — R2681 Unsteadiness on feet: Secondary | ICD-10-CM | POA: Diagnosis not present

## 2018-02-12 DIAGNOSIS — R2689 Other abnormalities of gait and mobility: Secondary | ICD-10-CM | POA: Diagnosis not present

## 2018-02-12 DIAGNOSIS — R293 Abnormal posture: Secondary | ICD-10-CM | POA: Diagnosis not present

## 2018-02-12 DIAGNOSIS — R29818 Other symptoms and signs involving the nervous system: Secondary | ICD-10-CM

## 2018-02-12 NOTE — Patient Instructions (Signed)
Provided pt handout of PWR! Moves in Sitting and PWR! Hands to perform 1x/day x 10 reps of each.

## 2018-02-12 NOTE — Therapy (Signed)
[place] [address] [place], [place], [address] Phone: [phone]   Fax:  [phone]  Physical Therapy Treatment  Patient Details  Name: Henry Adams MRN: [id number] Date of Birth: 1940/09/14 Referring Provider (PT): Tat, Wells Guiles   Encounter Date: 02/12/2018  PT End of Session - 02/12/18 1235    Visit Number  4    Number of Visits  13    Date for PT Re-Evaluation  04/01/18    Authorization Type  BCBS Medicare-will need 10th visit progress note    PT Start Time  [phone]   pt arrived late for appointment   PT Stop Time  0930    PT Time Calculation (min)  38 min    Activity Tolerance  Patient tolerated treatment well    Behavior During Therapy  [facility] for tasks assessed/performed       Past Medical History:  Diagnosis Date  . Allergy    perennial  . Anxiety   . Arthritis   . Cholelithiasis   . CKD (chronic kidney disease)   . Depression   . Diverticulosis   . Enlarged prostate   . Gallstones   . GERD (gastroesophageal reflux disease)   . HLD (hyperlipidemia)   . Hx of cardiovascular stress test    ETT-Myoview 6/14: probable low risk study, inf defect with normal inf wall motion-cannot r/o ischemia; prominent gut uptake adjacent to inf wall on rest images-poss defect is atten, EF 63%  . Hypertension   . Melanoma ([place])   . Polycystic kidney disease     Past Surgical History:  Procedure Laterality Date  . COLONOSCOPY  2011   diverticulosis  . HERNIA REPAIR     umbilical  . KNEE SURGERY Left   . medialization laryngoplasty  10/15/2013   Dr Joya Gaskins, [facility]  . TONSILLECTOMY AND ADENOIDECTOMY      There were no vitals filed for this visit.  Subjective Assessment - 02/12/18 0859    Subjective  States his Right knee is bothering him from getting up from toilet.  Is staying with a friend and the toilet seat is low for him.    Pertinent History  neuropathy, polycystic kidney    Patient Stated Goals  Pt's  goals are to get rid of some of the Parkinson's symptoms.    Currently in Pain?  Yes    Pain Score  0-No pain   goes to 6 or 8 when getting up-"clicks"   Pain Location  Knee    Pain Orientation  Right    Pain Descriptors / Indicators  Aching    Pain Type  Acute pain    Pain Onset  In the past 7 days    Pain Frequency  Intermittent    Aggravating Factors   getting up from toilet    Pain Relieving Factors  not sitting on low surface             PWR [facility]) - 02/12/18 1229    PWR! exercises  Moves in sitting    SITTING-PWR! Up  x 10    PWR! Rock  x 10    PWR! Twist   x 10    PWR! Step   x 10    Comments  gave as HEP    HANDS-PWR! Up  x 20    PWR! Rock  x 20    PWR! Twist  x 20    PWR! Step  x 20    Comments  Gave  as HEP          PT Education - 02/12/18 1233    Education Details  PWR! moves in sitting, PWR! hands, Community PD resources    Person(s) Educated  Patient    Methods  Explanation;Verbal cues;Handout;Demonstration    Comprehension  Verbalized understanding   needs continue reinforcement      PT Short Term Goals - 01/31/18 1324      PT SHORT TERM GOAL #1   Title  Pt will be independent with HEP for improved balance and mobility.  TARGET 02/28/2018    Time  4    Period  Weeks    Status  New    Target Date  02/28/18      PT SHORT TERM GOAL #2   Title  Pt will perform sit<>stand 8 of 10 reps, from <18" surfaces with minimal to no UE support, for improved low surface transfers.    Time  4    Period  Weeks    Status  New    Target Date  02/28/18      PT SHORT TERM GOAL #3   Title  Pt will improve TUG and TUG cognitive score to less than 10% difference, for improved dual tasking with gait.    Time  4    Period  Weeks    Status  New    Target Date  02/28/18        PT Long Term Goals - 01/31/18 1327      PT LONG TERM GOAL #1   Title  Pt will verbalize understanding of fall prevention in home environment, including tips to reduce freezing episodes  with gait.  TARGET 03/14/2018    Time  6    Period  Weeks    Status  New    Target Date  03/14/18      PT LONG TERM GOAL #2   Title  Pt will improve Dynamic Gait Index score to at least 19/24 for decreased fall risk.    Time  6    Period  Weeks    Status  New    Target Date  03/14/18      PT LONG TERM GOAL #3   Title  Pt will verbalize plans for continued community fitness upon d/c from PT.    Time  6    Period  Weeks    Status  New    Target Date  03/14/18      PT LONG TERM GOAL #4   Title  Pt will verbalize understanding of local Parkinson's disease resources.    Time  6    Period  Weeks    Status  New    Target Date  03/14/18            Plan - 02/12/18 1235    Clinical Impression Statement  Skilled session focused on reviewing PWR! hands and seated PWR!  Provided handouts for HEP.  Continues to need cues for intensity with all mobility.  Continue PT per POC.    Rehab Potential  Good    PT Frequency  2x / week    PT Duration  6 weeks   plus eval   PT Treatment/Interventions  ADLs/Self Care Home Management;Therapeutic exercise;Therapeutic activities;Functional mobility training;Gait training;Balance training;Neuromuscular re-education;Patient/family education    PT Next Visit Plan  Review PWR moves standing and possibly add to HEP.  Continue to work on intensity.   Please note pt is scheduled from 7 weeks, POC  is for 6 weeks   PT Home Exercise Plan  PWR! Moves seated and PWR! Hands    Consulted and Agree with Plan of Care  Patient       Patient will benefit from skilled therapeutic intervention in order to improve the following deficits and impairments:  Abnormal gait, Decreased balance, Decreased mobility, Difficulty walking, Postural dysfunction  Visit Diagnosis: Other abnormalities of gait and mobility  Unsteadiness on feet  Abnormal posture  Other symptoms and signs involving the nervous system     Problem List Patient Active Problem List    Diagnosis Date Noted  . Restless leg syndrome 01/23/2018  . Abdominal distension 12/25/2017  . Calculus of gallbladder with chronic cholecystitis without obstruction 09/18/2017  . Chronic idiopathic constipation 08/19/2017  . Age-related cognitive decline 01/09/2017  . Anxiety and depression 10/24/2016  . Insomnia 10/24/2016  . BPH associated with nocturia 08/11/2015  . Routine general medical examination at a health care facility 08/11/2015  . Vocal cord atrophy 09/01/2013  . Essential hypertension 04/26/2007  . Allergic rhinitis 04/26/2007  . DEGENERATIVE JOINT DISEASE 04/26/2007  . G E R D 08/05/2006  . Helicobacter pylori infection 05/30/2006  . Hyperlipidemia with target LDL less than 160 05/30/2006  . Polycystic kidney 05/30/2006    Narda Bonds, PTA [place] 02/12/18 12:39 PM Phone: [phone] Fax: [place] [address] [place], [place], [address] Phone: [phone]   Fax:  [phone]  Name: Henry Adams MRN: [id number] Date of Birth: 1940-03-07

## 2018-02-16 ENCOUNTER — Other Ambulatory Visit: Payer: Self-pay | Admitting: Neurology

## 2018-02-17 DIAGNOSIS — J3081 Allergic rhinitis due to animal (cat) (dog) hair and dander: Secondary | ICD-10-CM | POA: Diagnosis not present

## 2018-02-17 DIAGNOSIS — J301 Allergic rhinitis due to pollen: Secondary | ICD-10-CM | POA: Diagnosis not present

## 2018-02-17 DIAGNOSIS — J3089 Other allergic rhinitis: Secondary | ICD-10-CM | POA: Diagnosis not present

## 2018-02-18 ENCOUNTER — Encounter: Payer: Self-pay | Admitting: Physical Therapy

## 2018-02-18 ENCOUNTER — Ambulatory Visit: Payer: Medicare Other | Admitting: Physical Therapy

## 2018-02-18 DIAGNOSIS — R293 Abnormal posture: Secondary | ICD-10-CM

## 2018-02-18 DIAGNOSIS — R2689 Other abnormalities of gait and mobility: Secondary | ICD-10-CM | POA: Diagnosis not present

## 2018-02-18 DIAGNOSIS — R2681 Unsteadiness on feet: Secondary | ICD-10-CM

## 2018-02-18 DIAGNOSIS — R29818 Other symptoms and signs involving the nervous system: Secondary | ICD-10-CM | POA: Diagnosis not present

## 2018-02-18 NOTE — Therapy (Signed)
[facility] [address] [place], [place], [address] Phone: [phone]   Fax:  [phone]  Physical Therapy Treatment  Patient Details  Name: Henry Adams MRN: [id number] Date of Birth: [birth year] Referring Provider (PT): Tat, Wells Guiles   Encounter Date: 02/18/2018  PT End of Session - 02/18/18 0852    Visit Number  5    Number of Visits  13    Date for PT Re-Evaluation  04/01/18    Authorization Type  BC[employer] Medicare-will need 10th visit progress note    PT Start Time  0850    PT Stop Time  0935    PT Time Calculation (min)  45 min    Activity Tolerance  Patient tolerated treatment well    Behavior During Therapy  [facility] for tasks assessed/performed       Past Medical History:  Diagnosis Date  . Allergy    perennial  . Anxiety   . Arthritis   . Cholelithiasis   . CKD (chronic kidney disease)   . Depression   . Diverticulosis   . Enlarged prostate   . Gallstones   . GERD (gastroesophageal reflux disease)   . HLD (hyperlipidemia)   . Hx of cardiovascular stress test    ETT-Myoview 6/14: probable low risk study, inf defect with normal inf wall motion-cannot r/o ischemia; prominent gut uptake adjacent to inf wall on rest images-poss defect is atten, EF 63%  . Hypertension   . Melanoma ([place])   . Polycystic kidney disease     Past Surgical History:  Procedure Laterality Date  . COLONOSCOPY  2011   diverticulosis  . HERNIA REPAIR     umbilical  . KNEE SURGERY Left   . medialization laryngoplasty  10/15/2013   Dr Joya Gaskins, [facility]  . TONSILLECTOMY AND ADENOIDECTOMY      There were no vitals filed for this visit.  Subjective Assessment - 02/18/18 0853    Subjective  (Voice is very soft and difficult to understand with frequent requests to repeat himself).Reports he is only doing exercises occasionally. He's been sick. His right hip was bothering him and he stopped doing some exercises that were hurting (not the  exercises we've given him). [place] and caught himself  trying to step around someone in the bleachers at a basketball game. At end of session began asking about exercises for his hands to improve ability to button his buttons and his handwriting.     Pertinent History  neuropathy, polycystic kidney    Patient Stated Goals  Pt's goals are to get rid of some of the Parkinson's symptoms.    Currently in Pain?  No/denies                       [facility] Adult PT Treatment/Exercise - 02/18/18 1732      Ambulation/Gait   Ambulation/Gait  Yes    Ambulation/Gait Assistance  5: Supervision    Ambulation/Gait Assistance Details  vc and demonstration for incr step length, heelstrike and upright posture with looking forward/ahead    Ambulation Distance (Feet)  400 Feet    Assistive device  None    Gait Pattern  Step-through pattern;Decreased step length - right;Decreased step length - left;Decreased trunk rotation;Trunk flexed;Poor foot clearance - left;Poor foot clearance - right    Gait Comments  responds well to cues, however has difficulty maintaining one improvement when begin to focus on another area        PWR [facility]) -  02/18/18 1727    PWR! exercises  Moves in standing    PWR! Up  20    PWR! Rock  20    PWR! Twist  20    PWR Step  40   1st set with occasional assist for balance; 2nd at counter   Comments  gave as HEP; PWR! moves in standing; performing initial reps slowly to emphasize ROM and then perform with intensity and incr speed;          PT Education - 02/18/18 1738    Education Details  bring folder with exercises to each session; PWR! moves in standing; performing initial reps slowly to emphasize ROM and then perform with intensity and incr speed; educated on potential referral for OT and SLP (will discuss with his primary therapist)    Person(s) Educated  Patient    Methods  Explanation;Demonstration;Tactile cues;Verbal cues;Handout    Comprehension  Verbalized  understanding;Returned demonstration;Verbal cues required;Tactile cues required;Need further instruction       PT Short Term Goals - 01/31/18 1324      PT SHORT TERM GOAL #1   Title  Pt will be independent with HEP for improved balance and mobility.  TARGET 02/28/2018    Time  4    Period  Weeks    Status  New    Target Date  02/28/18      PT SHORT TERM GOAL #2   Title  Pt will perform sit<>stand 8 of 10 reps, from <18" surfaces with minimal to no UE support, for improved low surface transfers.    Time  4    Period  Weeks    Status  New    Target Date  02/28/18      PT SHORT TERM GOAL #3   Title  Pt will improve TUG and TUG cognitive score to less than 10% difference, for improved dual tasking with gait.    Time  4    Period  Weeks    Status  New    Target Date  02/28/18        PT Long Term Goals - 01/31/18 1327      PT LONG TERM GOAL #1   Title  Pt will verbalize understanding of fall prevention in home environment, including tips to reduce freezing episodes with gait.  TARGET 03/14/2018    Time  6    Period  Weeks    Status  New    Target Date  03/14/18      PT LONG TERM GOAL #2   Title  Pt will improve Dynamic Gait Index score to at least 19/24 for decreased fall risk.    Time  6    Period  Weeks    Status  New    Target Date  03/14/18      PT LONG TERM GOAL #3   Title  Pt will verbalize plans for continued community fitness upon d/c from PT.    Time  6    Period  Weeks    Status  New    Target Date  03/14/18      PT LONG TERM GOAL #4   Title  Pt will verbalize understanding of local Parkinson's disease resources.    Time  6    Period  Weeks    Status  New    Target Date  03/14/18            Plan - 02/18/18 1342    Clinical Impression Statement  Session focused on instruction in Standing PWR! exercises and gait training. Patient specifically asking whether we will address his difficulty with buttoning and declining hand-writing. Educated on roles of  OT and SLP related to Parkinson's and pt expressed an interest in requesting a referral from the MD for these therapies. Will discuss with his primary PT, Amy.     Rehab Potential  Good    PT Frequency  2x / week    PT Duration  6 weeks   plus eval   PT Treatment/Interventions  ADLs/Self Care Home Management;Therapeutic exercise;Therapeutic activities;Functional mobility training;Gait training;Balance training;Neuromuscular re-education;Patient/family education    PT Next Visit Plan  consider OT and SLP referrals (request from Dr. Carles Collet, if approp); Review PWR moves standing (try to have pt demonstrate from handout--recommend PWR step at counter. ?reissue PWR Hands--note states they were issued, but he did not have handout with his other sheets. Continue to work on intensity.   Please note pt is scheduled from 7 weeks, POC is for 6 weeks   PT Home Exercise Plan  PWR! Moves seated and PWR! Hands    Consulted and Agree with Plan of Care  Patient       Patient will benefit from skilled therapeutic intervention in order to improve the following deficits and impairments:  Abnormal gait, Decreased balance, Decreased mobility, Difficulty walking, Postural dysfunction  Visit Diagnosis: Other abnormalities of gait and mobility  Unsteadiness on feet  Abnormal posture     Problem List Patient Active Problem List   Diagnosis Date Noted  . Restless leg syndrome 01/23/2018  . Abdominal distension 12/25/2017  . Calculus of gallbladder with chronic cholecystitis without obstruction 09/18/2017  . Chronic idiopathic constipation 08/19/2017  . Age-related cognitive decline 01/09/2017  . Anxiety and depression 10/24/2016  . Insomnia 10/24/2016  . BPH associated with nocturia 08/11/2015  . Routine general medical examination at a health care facility 08/11/2015  . Vocal cord atrophy 09/01/2013  . Essential hypertension 04/26/2007  . Allergic rhinitis 04/26/2007  . DEGENERATIVE JOINT DISEASE  04/26/2007  . G E R D 08/05/2006  . Helicobacter pylori infection 05/30/2006  . Hyperlipidemia with target LDL less than 160 05/30/2006  . Polycystic kidney 05/30/2006    Rexanne Mano, PT 02/18/2018, 5:46 PM  [facility] [address] [place], [place], [address] Phone: [phone]   Fax:  [phone]  Name: Henry Adams MRN: [id number] Date of Birth: March 22, 1940

## 2018-02-18 NOTE — Patient Instructions (Signed)
  Patient brought in his loose exercise sheets and only had sitting PWR! And falls prevention handout. (Per chart, was also issued PWR! Hands). Instructed in and provided with standing PWR! Exercises.   Provided patient with a folder to keep his handouts in (currently Sitting PWR and Standing PWR--did not issue PWR hands as did not have time to review today).

## 2018-02-20 ENCOUNTER — Encounter: Payer: Self-pay | Admitting: Physical Therapy

## 2018-02-20 ENCOUNTER — Ambulatory Visit: Payer: Medicare Other | Admitting: Physical Therapy

## 2018-02-20 DIAGNOSIS — R2689 Other abnormalities of gait and mobility: Secondary | ICD-10-CM

## 2018-02-20 DIAGNOSIS — R293 Abnormal posture: Secondary | ICD-10-CM | POA: Diagnosis not present

## 2018-02-20 DIAGNOSIS — R29818 Other symptoms and signs involving the nervous system: Secondary | ICD-10-CM | POA: Diagnosis not present

## 2018-02-20 DIAGNOSIS — R2681 Unsteadiness on feet: Secondary | ICD-10-CM | POA: Diagnosis not present

## 2018-02-20 NOTE — Patient Instructions (Addendum)
HIP: Hamstrings - Short Sitting    Rest leg on raised surface or on the floor. Keep knee straight. Lift chest to sit tall.  You should feel a gentle stretch. Hold __15-30_ seconds. __3_ reps per set, __1-2_ sets per day.  You can especially try this at night before bedtime.  Copyright  VHI. All rights reserved.  Calf / Gastoc: Runners' Stretch I    One leg back and straight, other forward and bent supporting weight, lean forward, gently stretching calf of back leg, holding onto the counter. Hold _15-30___ seconds. Repeat with other leg. Repeat __3__ times each leg. Do __1-2__ sessions per day.  You can especially try to do this at night before bed.  Copyright  VHI. All rights reserved.

## 2018-02-20 NOTE — Therapy (Signed)
[facility] [address] [place], [place], [address] Phone: [phone]   Fax:  [phone]  Physical Therapy Treatment  Patient Details  Name: Henry Adams MRN: [id number] Date of Birth: Jan 11, 1941 Referring Provider (PT): Tat, Wells Guiles   Encounter Date: 02/20/2018  PT End of Session - 02/20/18 1212    Visit Number  6    Number of Visits  13    Date for PT Re-Evaluation  04/01/18    Authorization Type  BC[employer] Medicare-will need 10th visit progress note    PT Start Time  0850    PT Stop Time  0930    PT Time Calculation (min)  40 min    Activity Tolerance  Patient tolerated treatment well    Behavior During Therapy  [facility] for tasks assessed/performed       Past Medical History:  Diagnosis Date  . Allergy    perennial  . Anxiety   . Arthritis   . Cholelithiasis   . CKD (chronic kidney disease)   . Depression   . Diverticulosis   . Enlarged prostate   . Gallstones   . GERD (gastroesophageal reflux disease)   . HLD (hyperlipidemia)   . Hx of cardiovascular stress test    ETT-Myoview 6/14: probable low risk study, inf defect with normal inf wall motion-cannot r/o ischemia; prominent gut uptake adjacent to inf wall on rest images-poss defect is atten, EF 63%  . Hypertension   . Melanoma ([place])   . Polycystic kidney disease     Past Surgical History:  Procedure Laterality Date  . COLONOSCOPY  2011   diverticulosis  . HERNIA REPAIR     umbilical  . KNEE SURGERY Left   . medialization laryngoplasty  10/15/2013   Dr Joya Gaskins, [facility]  . TONSILLECTOMY AND ADENOIDECTOMY      There were no vitals filed for this visit.  Subjective Assessment - 02/20/18 0853    Subjective  Reports he fell the other day, at the gym on the bleachers.    Pertinent History  neuropathy, polycystic kidney    Patient Stated Goals  Pt's goals are to get rid of some of the Parkinson's symptoms.                       [place]  Adult PT Treatment/Exercise - 02/20/18 0001      Self-Care   Self-Care  Other Self-Care Comments    Other Self-Care Comments   Had discussion with patient about goals and POC for PT, as pt wants to work on his stomach issues and neuropathy.  Discussed that stomach/GI issues are not directly related to PT and he would have to follow-up with MD; also discussed that neuropathy/pain may play a role in balance, and this is how PT would address neuropathy in PT.      Exercises   Exercises  Knee/Hip;Ankle      Knee/Hip Exercises: Stretches   Active Hamstring Stretch  Right;Left;30 seconds;2 reps    Active Hamstring Stretch Limitations  Seated foot propped at floor    Gastroc Stretch  Right;Left;2 reps;30 seconds    Gastroc Stretch Limitations  AT counter, runner's stretch        [facility]) - 02/20/18 0902    PWR! exercises  Moves in standing    PWR! Up  x 10    PWR! Rock  x 20    PWR! Twist  x 20    PWR Step  x  20   UEs at counter   Comments  Reviewed PWR! Moves given as HEP last time; pt requires verbal cues and visual cues for each exercise for reminder on technique and form; educated pt in relation of each PWR! Moves exercise to functional activities          PT Education - 02/20/18 1210    Education Details  HEP additions-added and discussed trying stretches before bed to try to help with pain associated with lower legs/neuropathy at bedtime.    Person(s) Educated  Patient    Methods  Explanation;Demonstration;Handout    Comprehension  Verbalized understanding;Returned demonstration;Verbal cues required       PT Short Term Goals - 01/31/18 1324      PT SHORT TERM GOAL #1   Title  Pt will be independent with HEP for improved balance and mobility.  TARGET 02/28/2018    Time  4    Period  Weeks    Status  New    Target Date  02/28/18      PT SHORT TERM GOAL #2   Title  Pt will perform sit<>stand 8 of 10 reps, from <18" surfaces with minimal to no UE support, for improved low  surface transfers.    Time  4    Period  Weeks    Status  New    Target Date  02/28/18      PT SHORT TERM GOAL #3   Title  Pt will improve TUG and TUG cognitive score to less than 10% difference, for improved dual tasking with gait.    Time  4    Period  Weeks    Status  New    Target Date  02/28/18        PT Long Term Goals - 01/31/18 1327      PT LONG TERM GOAL #1   Title  Pt will verbalize understanding of fall prevention in home environment, including tips to reduce freezing episodes with gait.  TARGET 03/14/2018    Time  6    Period  Weeks    Status  New    Target Date  03/14/18      PT LONG TERM GOAL #2   Title  Pt will improve Dynamic Gait Index score to at least 19/24 for decreased fall risk.    Time  6    Period  Weeks    Status  New    Target Date  03/14/18      PT LONG TERM GOAL #3   Title  Pt will verbalize plans for continued community fitness upon d/c from PT.    Time  6    Period  Weeks    Status  New    Target Date  03/14/18      PT LONG TERM GOAL #4   Title  Pt will verbalize understanding of local Parkinson's disease resources.    Time  6    Period  Weeks    Status  New    Target Date  03/14/18            Plan - 02/20/18 1212    Clinical Impression Statement  Skilled PT session focused today on review of standing PWR! Moves exercises, with education of each exercise in relation to functional mobility.  Pt seems to have questions about therapy and multiple things that we can help him with, but he is not specific with what his needs are.  He denies wanting follow-up discussion today  on OT and speech therapy.  Pt will continue to benefit from skilled PT to address balance and gait, as he is at a fall risk per eval measures.    Rehab Potential  Good    PT Frequency  2x / week    PT Duration  6 weeks   plus eval   PT Treatment/Interventions  ADLs/Self Care Home Management;Therapeutic exercise;Therapeutic activities;Functional mobility  training;Gait training;Balance training;Neuromuscular re-education;Patient/family education    PT Next Visit Plan  consider OT and SLP referrals (request from Dr. Carles Collet, if approp)-if needed; review stretches given last visit; work on dynamic balance and gait based on DGI/eval balance measures   Please note pt is scheduled from 7 weeks, POC is for 6 weeks   PT Home Exercise Plan  PWR! Moves seated and PWR! standing    Consulted and Agree with Plan of Care  Patient       Patient will benefit from skilled therapeutic intervention in order to improve the following deficits and impairments:  Abnormal gait, Decreased balance, Decreased mobility, Difficulty walking, Postural dysfunction  Visit Diagnosis: Unsteadiness on feet  Other abnormalities of gait and mobility     Problem List Patient Active Problem List   Diagnosis Date Noted  . Restless leg syndrome 01/23/2018  . Abdominal distension 12/25/2017  . Calculus of gallbladder with chronic cholecystitis without obstruction 09/18/2017  . Chronic idiopathic constipation 08/19/2017  . Age-related cognitive decline 01/09/2017  . Anxiety and depression 10/24/2016  . Insomnia 10/24/2016  . BPH associated with nocturia 08/11/2015  . Routine general medical examination at a health care facility 08/11/2015  . Vocal cord atrophy 09/01/2013  . Essential hypertension 04/26/2007  . Allergic rhinitis 04/26/2007  . DEGENERATIVE JOINT DISEASE 04/26/2007  . G E R D 08/05/2006  . Helicobacter pylori infection 05/30/2006  . Hyperlipidemia with target LDL less than 160 05/30/2006  . Polycystic kidney 05/30/2006    Frazier Butt. 02/20/2018, 12:22 PM  Frazier Butt., PT   [facility] [address] [place], [place], [address] Phone: [phone]   Fax:  [phone]  Name: Henry Adams MRN: [id number] Date of Birth: [birth year]

## 2018-02-25 ENCOUNTER — Encounter: Payer: Self-pay | Admitting: Physical Therapy

## 2018-02-25 ENCOUNTER — Ambulatory Visit: Payer: Medicare Other | Attending: Neurology | Admitting: Physical Therapy

## 2018-02-25 DIAGNOSIS — R293 Abnormal posture: Secondary | ICD-10-CM | POA: Diagnosis not present

## 2018-02-25 DIAGNOSIS — R2689 Other abnormalities of gait and mobility: Secondary | ICD-10-CM | POA: Diagnosis not present

## 2018-02-25 DIAGNOSIS — R29818 Other symptoms and signs involving the nervous system: Secondary | ICD-10-CM | POA: Diagnosis not present

## 2018-02-25 DIAGNOSIS — R2681 Unsteadiness on feet: Secondary | ICD-10-CM | POA: Diagnosis not present

## 2018-02-25 NOTE — Therapy (Signed)
[facility] [address] [place], [place], [address] Phone: [phone]   Fax:  [phone]  Physical Therapy Treatment  Patient Details  Name: Henry Adams MRN: [id number] Date of Birth: February 13, 1940 Referring Provider (PT): Tat, Wells Guiles   Encounter Date: 02/25/2018  PT End of Session - 02/25/18 1111    Visit Number  7    Number of Visits  13    Date for PT Re-Evaluation  04/01/18    Authorization Type  [employer] Medicare-will need 10th visit progress note    PT Start Time  0934    PT Stop Time  1015    PT Time Calculation (min)  41 min    Activity Tolerance  Patient tolerated treatment well    Behavior During Therapy  [facility] for tasks assessed/performed       Past Medical History:  Diagnosis Date  . Allergy    perennial  . Anxiety   . Arthritis   . Cholelithiasis   . CKD (chronic kidney disease)   . Depression   . Diverticulosis   . Enlarged prostate   . Gallstones   . GERD (gastroesophageal reflux disease)   . HLD (hyperlipidemia)   . Hx of cardiovascular stress test    ETT-Myoview 6/14: probable low risk study, inf defect with normal inf wall motion-cannot r/o ischemia; prominent gut uptake adjacent to inf wall on rest images-poss defect is atten, EF 63%  . Hypertension   . Melanoma ([place])   . Polycystic kidney disease     Past Surgical History:  Procedure Laterality Date  . COLONOSCOPY  2011   diverticulosis  . HERNIA REPAIR     umbilical  . KNEE SURGERY Left   . medialization laryngoplasty  10/15/2013   Dr Joya Gaskins, [facility]  . TONSILLECTOMY AND ADENOIDECTOMY      There were no vitals filed for this visit.  Subjective Assessment - 02/25/18 0937    Subjective  Forgot my hearing aids today.  Realized yesterday that my feet weren't clearing the ground as much.  Want to work on that today.    Pertinent History  neuropathy, polycystic kidney    Patient Stated Goals  Pt's goals are to get rid of some of the  Parkinson's symptoms.    Currently in Pain?  No/denies                       [facility] Adult PT Treatment/Exercise - 02/25/18 0001      Ambulation/Gait   Ambulation/Gait  Yes    Ambulation/Gait Assistance  5: Supervision    Ambulation/Gait Assistance Details  Cues for deliberate step length, heelstrike, foot clearance; cues for posture and relaxed arm swing    Ambulation Distance (Feet)  600 Feet    Assistive device  None    Gait Pattern  Step-through pattern;Decreased step length - right;Decreased step length - left;Decreased trunk rotation;Trunk flexed;Poor foot clearance - left;Poor foot clearance - right   Improved foot clearance with exercises and cues   Gait Comments  Discussed and practiced deliberate pattern for walking to improve foot clearance and heelstrike.  Provided cues for listening (to avoid hearing feet scuffing with gait)  Discussed resetting posture and step length if he begins scuffing.  Discussed seated rest breaks if needed for fatigue.      Knee/Hip Exercises: Standing   Heel Raises  Both;2 sets;10 reps   Toe raises   Other Standing Knee Exercises  Stagger stance forward and  back rocking, 2 sets x 10 reps, for active ankle dorsiflexion and weightshifting.          Balance Exercises - 02/25/18 0946      Balance Exercises: Standing   Other Standing Exercises  Forward step and weightshift over obstace, 10 reps, then alternating legs x 10 reps.  Back step and weightshift x 10 reps, then forward<>back step and weightshift x 10 reps each leg, to address step length, foot clearance, heelstrike.     With specific, direct verbal and visual cues, pt able to perform exercises correctly to address step length, foot clearance, heelstrike   PT Education - 02/25/18 1110    Education Details  Deliberate pattern with gait for improved foot clearance and heelstrike    Person(s) Educated  Patient    Methods  Explanation;Demonstration    Comprehension  Verbalized  understanding;Returned demonstration       PT Short Term Goals - 01/31/18 1324      PT SHORT TERM GOAL #1   Title  Pt will be independent with HEP for improved balance and mobility.  TARGET 02/28/2018    Time  4    Period  Weeks    Status  New    Target Date  02/28/18      PT SHORT TERM GOAL #2   Title  Pt will perform sit<>stand 8 of 10 reps, from <18" surfaces with minimal to no UE support, for improved low surface transfers.    Time  4    Period  Weeks    Status  New    Target Date  02/28/18      PT SHORT TERM GOAL #3   Title  Pt will improve TUG and TUG cognitive score to less than 10% difference, for improved dual tasking with gait.    Time  4    Period  Weeks    Status  New    Target Date  02/28/18        PT Long Term Goals - 01/31/18 1327      PT LONG TERM GOAL #1   Title  Pt will verbalize understanding of fall prevention in home environment, including tips to reduce freezing episodes with gait.  TARGET 03/14/2018    Time  6    Period  Weeks    Status  New    Target Date  03/14/18      PT LONG TERM GOAL #2   Title  Pt will improve Dynamic Gait Index score to at least 19/24 for decreased fall risk.    Time  6    Period  Weeks    Status  New    Target Date  03/14/18      PT LONG TERM GOAL #3   Title  Pt will verbalize plans for continued community fitness upon d/c from PT.    Time  6    Period  Weeks    Status  New    Target Date  03/14/18      PT LONG TERM GOAL #4   Title  Pt will verbalize understanding of local Parkinson's disease resources.    Time  6    Period  Weeks    Status  New    Target Date  03/14/18            Plan - 02/25/18 1111    Clinical Impression Statement  Pt verbalizes at beginning of session that he feels he is shuffling his feet more.  Therefore,  skilled PT session focused on foot clearance, step length, heelstrike.  After repeated, targeted exercies at the counter to address the above, transitioned to gait activities,  where pt is able to demonstrate overall improved foot clearance.  Educated pt on listening for his feet scuffing and his goal should be to NOT hear this happening.  Pt will continue to benefit from skilled PT to address balance, posture, gait training for improved functional mobility, decreased fall risk.    Rehab Potential  Good    PT Frequency  2x / week    PT Duration  6 weeks   plus eval   PT Treatment/Interventions  ADLs/Self Care Home Management;Therapeutic exercise;Therapeutic activities;Functional mobility training;Gait training;Balance training;Neuromuscular re-education;Patient/family education    PT Next Visit Plan  consider OT and SLP referrals (request from Dr. Carles Collet, if approp)-if needed; review stretches given last week; Check STGs; work on dynamic balance and gait based on DGI/eval balance measures   Please note pt is scheduled from 7 weeks, POC is for 6 weeks   PT Home Exercise Plan  PWR! Moves seated and PWR! standing    Consulted and Agree with Plan of Care  Patient       Patient will benefit from skilled therapeutic intervention in order to improve the following deficits and impairments:  Abnormal gait, Decreased balance, Decreased mobility, Difficulty walking, Postural dysfunction  Visit Diagnosis: Other abnormalities of gait and mobility  Unsteadiness on feet     Problem List Patient Active Problem List   Diagnosis Date Noted  . Restless leg syndrome 01/23/2018  . Abdominal distension 12/25/2017  . Calculus of gallbladder with chronic cholecystitis without obstruction 09/18/2017  . Chronic idiopathic constipation 08/19/2017  . Age-related cognitive decline 01/09/2017  . Anxiety and depression 10/24/2016  . Insomnia 10/24/2016  . BPH associated with nocturia 08/11/2015  . Routine general medical examination at a health care facility 08/11/2015  . Vocal cord atrophy 09/01/2013  . Essential hypertension 04/26/2007  . Allergic rhinitis 04/26/2007  . DEGENERATIVE  JOINT DISEASE 04/26/2007  . G E R D 08/05/2006  . Helicobacter pylori infection 05/30/2006  . Hyperlipidemia with target LDL less than 160 05/30/2006  . Polycystic kidney 05/30/2006    Frazier Butt. 02/25/2018, 11:20 AM  Frazier Butt., PT  [facility] [address] [place], [place], [address] Phone: [phone]   Fax:  [phone]  Name: Henry Adams MRN: [id number] Date of Birth: November 28, 1940

## 2018-02-27 ENCOUNTER — Ambulatory Visit: Payer: Medicare Other | Admitting: Physical Therapy

## 2018-03-04 ENCOUNTER — Ambulatory Visit: Payer: Medicare Other | Admitting: Physical Therapy

## 2018-03-04 ENCOUNTER — Encounter: Payer: Self-pay | Admitting: Physical Therapy

## 2018-03-04 DIAGNOSIS — K8020 Calculus of gallbladder without cholecystitis without obstruction: Secondary | ICD-10-CM | POA: Diagnosis not present

## 2018-03-04 DIAGNOSIS — G20 Parkinson's disease: Secondary | ICD-10-CM | POA: Diagnosis not present

## 2018-03-04 DIAGNOSIS — R2689 Other abnormalities of gait and mobility: Secondary | ICD-10-CM | POA: Diagnosis not present

## 2018-03-04 DIAGNOSIS — K7689 Other specified diseases of liver: Secondary | ICD-10-CM | POA: Diagnosis not present

## 2018-03-04 DIAGNOSIS — R2681 Unsteadiness on feet: Secondary | ICD-10-CM | POA: Diagnosis not present

## 2018-03-04 DIAGNOSIS — R293 Abnormal posture: Secondary | ICD-10-CM | POA: Diagnosis not present

## 2018-03-04 DIAGNOSIS — R29818 Other symptoms and signs involving the nervous system: Secondary | ICD-10-CM

## 2018-03-04 DIAGNOSIS — K5909 Other constipation: Secondary | ICD-10-CM | POA: Diagnosis not present

## 2018-03-04 NOTE — Therapy (Signed)
[facility] [address] [place], [place], [address] Phone: [phone]   Fax:  [phone]  Physical Therapy Treatment  Patient Details  Name: Henry Adams MRN: [id number] Date of Birth: 1940-10-20 Referring Provider (PT): Tat, Wells Guiles   Encounter Date: 03/04/2018  PT End of Session - 03/04/18 1320    Visit Number  8    Number of Visits  13    Date for PT Re-Evaluation  04/01/18    Authorization Type  BCBS Medicare-will need 10th visit progress note    PT Start Time  0937    PT Stop Time  1016    PT Time Calculation (min)  39 min    Activity Tolerance  Patient tolerated treatment well    Behavior During Therapy  [facility] for tasks assessed/performed       Past Medical History:  Diagnosis Date  . Allergy    perennial  . Anxiety   . Arthritis   . Cholelithiasis   . CKD (chronic kidney disease)   . Depression   . Diverticulosis   . Enlarged prostate   . Gallstones   . GERD (gastroesophageal reflux disease)   . HLD (hyperlipidemia)   . Hx of cardiovascular stress test    ETT-Myoview 6/14: probable low risk study, inf defect with normal inf wall motion-cannot r/o ischemia; prominent gut uptake adjacent to inf wall on rest images-poss defect is atten, EF 63%  . Hypertension   . Melanoma ([place])   . Polycystic kidney disease     Past Surgical History:  Procedure Laterality Date  . COLONOSCOPY  2011   diverticulosis  . HERNIA REPAIR     umbilical  . KNEE SURGERY Left   . medialization laryngoplasty  10/15/2013   Dr Joya Gaskins, [facility]  . TONSILLECTOMY AND ADENOIDECTOMY      There were no vitals filed for this visit.  Subjective Assessment - 03/04/18 0940    Subjective  No changes.  Things going pretty good.  Balance not going quite as good as it should be.    Pertinent History  neuropathy, polycystic kidney    Patient Stated Goals  Pt's goals are to get rid of some of the Parkinson's symptoms.    Currently in  Pain?  No/denies                       [facility] Adult PT Treatment/Exercise - 03/04/18 0001      Transfers   Transfers  Sit to Stand;Stand to Sit    Sit to Stand  6: Modified independent (Device/Increase time);Without upper extremity assist;From chair/3-in-1    Stand to Sit  6: Modified independent (Device/Increase time);Without upper extremity assist;To chair/3-in-1    Number of Reps  10 reps;Other sets (comment);Other reps (comment)   18" chair, then 5 reps from 16" chair   Transfer Cueing  Cues provided for technique, especially for lower surface transfers    Comments  Requests no more than 5 reps lower surface, due to knee pain      Standardized Balance Assessment   Standardized Balance Assessment  Timed Up and Go Test      Timed Up and Go Test   TUG  Normal TUG;Manual TUG;Cognitive TUG    Normal TUG (seconds)  17.19   14.53   Cognitive TUG (seconds)  20.44   16.25       PWR ([facility]) - 03/04/18 1000    PWR! exercises  Moves in standing  PWR! Up  x 10    PWR! Rock  x 10 reps each side    PWR! Twist  x 10 reps each side with tactile and verbal cues    PWR Step  x 10 reps each side    Comments  Review of HEP-pt requires verbal, visual and at times tactile cues for technique       Balance Exercises - 03/04/18 1009      Balance Exercises: Standing   Heel Raises Limitations  x 10 reps    Toe Raise Limitations  x 10 reps    Other Standing Exercises  Forward step and weightshift over obstacle, 10 reps, then alternating legs x 10 reps.  Back step and weightshift x 10 reps.  Cues for increased step length, heelstrike, foot clearance      Reviewed seated stretches given last visit:  Seated hamstring stretch 2 x 30 seconds each leg, standing runner's stretch, 2 x 30 seconds, with pt requiring cues for reminder of technique.  PT Education - 03/04/18 1320    Education Details  Progress towards goals, need to focus on gait only and avoid distractions/dual-tasking  activities to lessen fall risk    Person(s) Educated  Patient    Methods  Explanation    Comprehension  Verbalized understanding       PT Short Term Goals - 03/04/18 0941      PT SHORT TERM GOAL #1   Title  Pt will be independent with HEP for improved balance and mobility.  TARGET 02/28/2018    Baseline  Performs HEP, but needs cues    Time  4    Period  Weeks    Status  Partially Met    Target Date  02/28/18      PT SHORT TERM GOAL #2   Title  Pt will perform sit<>stand 8 of 10 reps, from <18" surfaces with minimal to no UE support, for improved low surface transfers.    Baseline  performs 10 reps from 18" surface, 5 reps from 16" surfaces (pt requests to stop due to pain in R knee)    Time  4    Period  Weeks    Status  Partially Met    Target Date  02/28/18      PT SHORT TERM GOAL #3   Title  Pt will improve TUG and TUG cognitive score to less than 10% difference, for improved dual tasking with gait.    Time  4    Period  Weeks    Status  Not Met    Target Date  02/28/18        PT Long Term Goals - 01/31/18 1327      PT LONG TERM GOAL #1   Title  Pt will verbalize understanding of fall prevention in home environment, including tips to reduce freezing episodes with gait.  TARGET 03/14/2018    Time  6    Period  Weeks    Status  New    Target Date  03/14/18      PT LONG TERM GOAL #2   Title  Pt will improve Dynamic Gait Index score to at least 19/24 for decreased fall risk.    Time  6    Period  Weeks    Status  New    Target Date  03/14/18      PT LONG TERM GOAL #3   Title  Pt will verbalize plans for continued community fitness upon d/c from  PT.    Time  6    Period  Weeks    Status  New    Target Date  03/14/18      PT LONG TERM GOAL #4   Title  Pt will verbalize understanding of local Parkinson's disease resources.    Time  6    Period  Weeks    Status  New    Target Date  03/14/18            Plan - 03/04/18 1321    Clinical Impression  Statement  Skilled PT session today focused on review of HEP as well as checking STGs.  Pt has not fully or consistently performed his HEP in the past week or so due to his wife being in the hospital.  He has partially met STG 1 for HEP (he performs but needs cues), partially met STG 2 for sit<>stand (can perform 5 reps from <18" surface, but pt requests to stop due to pain in R knee). Pt has not met STG for TUG/TUG cognitive score, as pt has >10% difference in TUG and TUG cognitive scores, in 2 trials.  Discussed need to fully focus on gait and avoid distractions/dual tasking with gait due to continued difficulty with dual tasking, increasing pt's risk of falls.    Rehab Potential  Good    PT Frequency  2x / week    PT Duration  6 weeks   plus eval   PT Treatment/Interventions  ADLs/Self Care Home Management;Therapeutic exercise;Therapeutic activities;Functional mobility training;Gait training;Balance training;Neuromuscular re-education;Patient/family education    PT Next Visit Plan  consider OT and SLP referrals (request from Dr. Carles Collet, if approp)-if needed; work on dynamic balance and gait based on DGI/eval balance measures   Please note pt is scheduled from 7 weeks, POC is for 6 weeks   PT Home Exercise Plan  PWR! Moves seated and PWR! standing    Consulted and Agree with Plan of Care  Patient       Patient will benefit from skilled therapeutic intervention in order to improve the following deficits and impairments:  Abnormal gait, Decreased balance, Decreased mobility, Difficulty walking, Postural dysfunction  Visit Diagnosis: Unsteadiness on feet  Other symptoms and signs involving the nervous system     Problem List Patient Active Problem List   Diagnosis Date Noted  . Restless leg syndrome 01/23/2018  . Abdominal distension 12/25/2017  . Calculus of gallbladder with chronic cholecystitis without obstruction 09/18/2017  . Chronic idiopathic constipation 08/19/2017  . Age-related  cognitive decline 01/09/2017  . Anxiety and depression 10/24/2016  . Insomnia 10/24/2016  . BPH associated with nocturia 08/11/2015  . Routine general medical examination at a health care facility 08/11/2015  . Vocal cord atrophy 09/01/2013  . Essential hypertension 04/26/2007  . Allergic rhinitis 04/26/2007  . DEGENERATIVE JOINT DISEASE 04/26/2007  . G E R D 08/05/2006  . Helicobacter pylori infection 05/30/2006  . Hyperlipidemia with target LDL less than 160 05/30/2006  . Polycystic kidney 05/30/2006    Frazier Butt. 03/04/2018, 1:26 PM  Frazier Butt., PT   [facility] [address] [place], [place], [address] Phone: [phone]   Fax:  [phone]  Name: Henry Adams MRN: [id number] Date of Birth: 1940/04/16

## 2018-03-06 ENCOUNTER — Ambulatory Visit: Payer: Medicare Other | Admitting: Physical Therapy

## 2018-03-06 ENCOUNTER — Encounter: Payer: Self-pay | Admitting: Physical Therapy

## 2018-03-06 DIAGNOSIS — R2681 Unsteadiness on feet: Secondary | ICD-10-CM

## 2018-03-06 DIAGNOSIS — R293 Abnormal posture: Secondary | ICD-10-CM

## 2018-03-06 DIAGNOSIS — R2689 Other abnormalities of gait and mobility: Secondary | ICD-10-CM | POA: Diagnosis not present

## 2018-03-06 DIAGNOSIS — R29818 Other symptoms and signs involving the nervous system: Secondary | ICD-10-CM | POA: Diagnosis not present

## 2018-03-06 NOTE — Therapy (Signed)
[facility] [address] [place], [place], [address] Phone: [phone]   Fax:  [phone]  Physical Therapy Treatment  Patient Details  Name: Henry Adams MRN: [id number] Date of Birth: 1940-11-26 Referring Provider (PT): Tat, Wells Guiles   Encounter Date: 03/06/2018  PT End of Session - 03/06/18 1831    Visit Number  9    Number of Visits  13    Date for PT Re-Evaluation  04/01/18    Authorization Type  BCBS Medicare-will need 10th visit progress note    PT Start Time  0937    PT Stop Time  1016    PT Time Calculation (min)  39 min    Activity Tolerance  Patient tolerated treatment well    Behavior During Therapy  [facility] for tasks assessed/performed       Past Medical History:  Diagnosis Date  . Allergy    perennial  . Anxiety   . Arthritis   . Cholelithiasis   . CKD (chronic kidney disease)   . Depression   . Diverticulosis   . Enlarged prostate   . Gallstones   . GERD (gastroesophageal reflux disease)   . HLD (hyperlipidemia)   . Hx of cardiovascular stress test    ETT-Myoview 6/14: probable low risk study, inf defect with normal inf wall motion-cannot r/o ischemia; prominent gut uptake adjacent to inf wall on rest images-poss defect is atten, EF 63%  . Hypertension   . Melanoma ([place])   . Polycystic kidney disease     Past Surgical History:  Procedure Laterality Date  . COLONOSCOPY  2011   diverticulosis  . HERNIA REPAIR     umbilical  . KNEE SURGERY Left   . medialization laryngoplasty  10/15/2013   Dr Joya Gaskins, [facility]  . TONSILLECTOMY AND ADENOIDECTOMY      There were no vitals filed for this visit.  Subjective Assessment - 03/06/18 0939    Subjective  No changes.    Pertinent History  neuropathy, polycystic kidney    Patient Stated Goals  Pt's goals are to get rid of some of the Parkinson's symptoms.    Currently in Pain?  No/denies                       [facility] Adult PT  Treatment/Exercise - 03/06/18 0001      Self-Care   Self-Care  Other Self-Care Comments    Other Self-Care Comments   Discussed POC and options for community fitness-pt is not interested in classes, not interested in gym-type setting or machines.  Would like to discuss PD exercises, but we ran out of time at end of session          Balance Exercises - 03/06/18 0940      Balance Exercises: Standing   Standing Eyes Opened  Wide ([place]);Narrow base of support (BOS);Foam/compliant surface;5 reps;Head turns   Head nods   Standing Eyes Closed  Wide (BOA);Narrow base of support (BOS);Foam/compliant surface;1 rep;10 secs    Rockerboard  Anterior/posterior;Lateral;Head turns;EO   Head nods, hip/ankle strategy x 10, UE lifts   Gait with Head Turns  Forward;Intermittent upper extremity support;1 rep   head nods at counter, then in gym 60 ft each   Retro Gait  Upper extremity support;3 reps   Forward/back walking at [employer] Limitations  Marching in place x 10 reps, then forward/back marching at counter, 3 reps    Heel Raises Limitations  2 sets  x 10 reps    Toe Raise Limitations  2 sets x 10 reps     Other Standing Exercises  On Airex:  marching in place x 10 reps, then alternating forward kicks, then alternating forward step taps x 10 reps each, side step taps x 10 reps each on Airex          PT Short Term Goals - 03/04/18 0941      PT SHORT TERM GOAL #1   Title  Pt will be independent with HEP for improved balance and mobility.  TARGET 02/28/2018    Baseline  Performs HEP, but needs cues    Time  4    Period  Weeks    Status  Partially Met    Target Date  02/28/18      PT SHORT TERM GOAL #2   Title  Pt will perform sit<>stand 8 of 10 reps, from <18" surfaces with minimal to no UE support, for improved low surface transfers.    Baseline  performs 10 reps from 18" surface, 5 reps from 16" surfaces (pt requests to stop due to pain in R knee)    Time  4    Period  Weeks     Status  Partially Met    Target Date  02/28/18      PT SHORT TERM GOAL #3   Title  Pt will improve TUG and TUG cognitive score to less than 10% difference, for improved dual tasking with gait.    Time  4    Period  Weeks    Status  Not Met    Target Date  02/28/18        PT Long Term Goals - 01/31/18 1327      PT LONG TERM GOAL #1   Title  Pt will verbalize understanding of fall prevention in home environment, including tips to reduce freezing episodes with gait.  TARGET 03/14/2018    Time  6    Period  Weeks    Status  New    Target Date  03/14/18      PT LONG TERM GOAL #2   Title  Pt will improve Dynamic Gait Index score to at least 19/24 for decreased fall risk.    Time  6    Period  Weeks    Status  New    Target Date  03/14/18      PT LONG TERM GOAL #3   Title  Pt will verbalize plans for continued community fitness upon d/c from PT.    Time  6    Period  Weeks    Status  New    Target Date  03/14/18      PT LONG TERM GOAL #4   Title  Pt will verbalize understanding of local Parkinson's disease resources.    Time  6    Period  Weeks    Status  New    Target Date  03/14/18            Plan - 03/06/18 1833    Clinical Impression Statement  Skilled PT session today focused mainly on dynamic balance and compliant surface balance training, with pt needing mostly intermittent UE support for compliant surface work.  Began discussion of community fitness options, but pt not interested in group exercises or use of machines at an exercise facility.  Will plan to discuss more PD-related exercises and resources next visit, with likely plans for d/c next week.  Rehab Potential  Good    PT Frequency  2x / week    PT Duration  6 weeks   plus eval   PT Treatment/Interventions  ADLs/Self Care Home Management;Therapeutic exercise;Therapeutic activities;Functional mobility training;Gait training;Balance training;Neuromuscular re-education;Patient/family education    PT Next  Visit Plan  consider OT and SLP referrals (request from Dr. Carles Collet, if approp)-if needed; discuss PD-related exercise and community resources; next week is wek 6 of 6-need to check goals and likely d/c   Please note pt is scheduled from 7 weeks, POC is for 6 weeks   PT Home Exercise Plan  PWR! Moves seated and PWR! standing    Consulted and Agree with Plan of Care  Patient       Patient will benefit from skilled therapeutic intervention in order to improve the following deficits and impairments:  Abnormal gait, Decreased balance, Decreased mobility, Difficulty walking, Postural dysfunction  Visit Diagnosis: Unsteadiness on feet  Abnormal posture     Problem List Patient Active Problem List   Diagnosis Date Noted  . Restless leg syndrome 01/23/2018  . Abdominal distension 12/25/2017  . Calculus of gallbladder with chronic cholecystitis without obstruction 09/18/2017  . Chronic idiopathic constipation 08/19/2017  . Age-related cognitive decline 01/09/2017  . Anxiety and depression 10/24/2016  . Insomnia 10/24/2016  . BPH associated with nocturia 08/11/2015  . Routine general medical examination at a health care facility 08/11/2015  . Vocal cord atrophy 09/01/2013  . Essential hypertension 04/26/2007  . Allergic rhinitis 04/26/2007  . DEGENERATIVE JOINT DISEASE 04/26/2007  . G E R D 08/05/2006  . Helicobacter pylori infection 05/30/2006  . Hyperlipidemia with target LDL less than 160 05/30/2006  . Polycystic kidney 05/30/2006    Frazier Butt. 03/06/2018, 6:36 PM  Frazier Butt., PT   [facility] [address] [place], [place], [address] Phone: [phone]   Fax:  [phone]  Name: Henry Adams MRN: [id number] Date of Birth: Apr 25, 1940

## 2018-03-07 ENCOUNTER — Ambulatory Visit: Payer: Medicare Other | Admitting: Gastroenterology

## 2018-03-07 ENCOUNTER — Encounter: Payer: Self-pay | Admitting: Gastroenterology

## 2018-03-07 VITALS — BP 136/88 | HR 81 | Ht 71.0 in | Wt 159.4 lb

## 2018-03-07 DIAGNOSIS — K8020 Calculus of gallbladder without cholecystitis without obstruction: Secondary | ICD-10-CM | POA: Diagnosis not present

## 2018-03-07 DIAGNOSIS — K5900 Constipation, unspecified: Secondary | ICD-10-CM

## 2018-03-07 DIAGNOSIS — G20 Parkinson's disease: Secondary | ICD-10-CM | POA: Diagnosis not present

## 2018-03-07 DIAGNOSIS — K219 Gastro-esophageal reflux disease without esophagitis: Secondary | ICD-10-CM

## 2018-03-07 MED ORDER — PRUCALOPRIDE SUCCINATE 2 MG PO TABS: 2.0000 mg | ORAL_TABLET | Freq: Every day | ORAL | 0 refills | Status: DC

## 2018-03-07 NOTE — Progress Notes (Signed)
P  Chief Complaint:    Chronic constipation  GI History: 78 year old male initially seen by me on 10/10/2017 (previous patient of Dr. Deatra Ina, last seen by him in 2009) for chronic constipation.  See note dated 10/10/2017 for historical data.  At his last appointment, had endorsed exacerbation of constipation in 2019, which seems temporally related to starting Sinemet in 01/2017, with up titration to 3 times daily dosing in spring 2019 for newly diagnosed parkinsonism.  Added carbidopa/levodopa CR nightly and 02/2017.  Treated with continued Linzess 290 mcg/day, started MiraLAX 1 cap daily, with consideration to add Colace 100 mg as needed.  Otherwise, continued high fluid/water intake and high-fiber diet.  Was additionally seen by general surgery on 03/04/2018 for cholelithiasis (ultrasound was 20 mm GB stone without cholecystitis or choledocholithiasis).  Surgeon clinically agreed that abdominal symptoms seemed more related to his underlying constipation rather than biliary in etiology, and ultimately recommended against cholecystectomy.  As previously indicated that he does not want any endoscopic evaluation unless absolutely necessary.  HPI:     Patient is a 78 y.o. male presenting to the [facility] for follow-up.  He was last seen by me on 10/10/2017 for chronic constipation.  He again presents with his daughter today.  Today, he states was having BM Q 2-3 days, but now back to every 4-5 days. Taking Linzess as prescribed and has added Miralax back to Q 2 days or so.  Tolerating p.o. intake, but with reduced appetite at times which he states is secondary to abdominal fullness.  Does describe intermittent abdominal discomfort, but not frank pain.  No hematochezia or melena.  Was additionally seen by his Surgeon at [facility] with no plan for ccy at this time as described above.  Endoscopic history: -Colonoscopy 09/2007: Sigmoid diverticulosis, otherwise normal colon.  Repeat in 10  years -Colonoscopy 06/2001: Sigmoid diverticulosis, otherwise normal colon.  Review of systems:     No chest pain, no SOB, no fevers, no urinary sx   Past Medical History:  Diagnosis Date  . Allergy    perennial  . Anxiety   . Arthritis   . Cholelithiasis   . CKD (chronic kidney disease)   . Depression   . Diverticulosis   . Enlarged prostate   . Gallstones   . GERD (gastroesophageal reflux disease)   . HLD (hyperlipidemia)   . Hx of cardiovascular stress test    ETT-Myoview 6/14: probable low risk study, inf defect with normal inf wall motion-cannot r/o ischemia; prominent gut uptake adjacent to inf wall on rest images-poss defect is atten, EF 63%  . Hypertension   . Melanoma ([place])   . Polycystic kidney disease     Patient's surgical history, family medical history, social history, medications and allergies were all reviewed in Epic    Current Outpatient Medications  Medication Sig Dispense Refill  . buPROPion (WELLBUTRIN XL) 150 MG 24 hr tablet Take 1 tablet (150 mg total) by mouth daily. 30 tablet 5  . carbidopa-levodopa (SINEMET IR) 25-100 MG tablet 1 tablet at 7am/11am/3pm and 1.5 at 7pm 405 tablet 1  . Carbidopa-Levodopa ER (SINEMET CR) 25-100 MG tablet controlled release Take 1 tablet by mouth at bedtime. 90 tablet 1  . clonazePAM (KLONOPIN) 0.5 MG tablet Take 0.5 tablets (0.25 mg total) by mouth at bedtime. 45 tablet 0  . fexofenadine (ALLEGRA) 180 MG tablet Take 180 mg by mouth daily.      [name] linaclotide (LINZESS) 290 MCG CAPS capsule Take 1 capsule (  290 mcg total) by mouth daily before breakfast. 90 capsule 1  . meclizine (ANTIVERT) 12.5 MG tablet Take 1 tablet (12.5 mg total) by mouth 3 (three) times daily as needed for dizziness. 30 tablet 0  . omeprazole (PRILOSEC) 40 MG capsule TAKE (1) CAPSULE DAILY. 90 capsule 0  . Tolvaptan (JYNARQUE PO) Take by mouth.    . vitamin B-12 (CYANOCOBALAMIN) 1000 MCG tablet Take 1,000 mcg by mouth daily.    . zaleplon (SONATA) 5 MG  capsule Take 1 capsule (5 mg total) by mouth at bedtime as needed for sleep. 90 capsule 1   No current facility-administered medications for this visit.     Physical Exam:     There were no vitals taken for this visit.  GENERAL:  Pleasant male in NAD PSYCH: : Cooperative, normal affect EENT:  conjunctiva pink, mucous membranes moist, neck supple without masses CARDIAC:  RRR, no murmur heard, no peripheral edema PULM: Normal respiratory effort, lungs CTA bilaterally, no wheezing ABDOMEN:  Nondistended, soft, nontender. No obvious masses, no hepatomegaly,  normal bowel sounds SKIN:  turgor, no lesions seen Musculoskeletal:  Normal muscle tone, normal strength NEURO: Alert and oriented x 3, no focal neurologic deficits   IMPRESSION and PLAN:    1) Constipation: History of Chronic Idiopathic Constipation with exacerbation in the last year or so likely multifactorial 2/2 underlying parkinsonism and Parkinson medications (Sinemet has 6% ADR of constipation).  Had initially responded to [place] with addition of MiraLAX, but now decreasing bowel frequency, increasing straining.  Requesting change in medication, particularly 1 that is not involving electrolyte balance issues due to his medications for polycystic kidney disease.  We will plan on treating as below:  -Trial Motegrity 2 mg daily.  Provided with samples today along with Rx and coupon -Discussed the medication ADRs of Motegrity, to include headache, abdominal pain, diarrhea. CrCl >30 so no dose adjustment necessary -Stop Linzess -Hold MiraLAX for now.  After at least 7 days of Motegrity, and if needing supplemental medications, can reintroduce MiraLAX once daily and titrate to effect - If still having regular stools without straining, can reduce MiraLAX again and introduce Colace as 100 mg 1-2 times/day - Continue high fluid/water intake daily with at least 8 glasses of 8 oz per day - Continue high fiber diet - If no improvement  with the above intervention, can again consider colonoscopy to rule out intraluminal etiology, which he is indicated he only wants done if absolutely necessary  2) Colon Cancer screening: Last colonoscopy 10 years ago without any polyps.  Patient does not want to proceed with repeat colonoscopies for colon cancer screening.  Given age and comorbidities, reasonable to stop colonoscopy for routine colon cancer screening.  Would only repeat colonoscopy for GI symptoms as noted above.  3) GERD: Well-controlled with Prilosec.  No change in medications at this time.  4) Cholelithiasis: Recent ultrasound 2 cm stone with otherwise normal HIDA.    Has been seen by [facility] and no plan for elective cholecystectomy.  5) Parkinsonism: -Follows closely with neurology - Does have related depression, particularly related to wife's transition to memory care living.  He does follow with a Social worker.  No SI.  RTC in 1 month or sooner as needed.  I spent a total of 15 minutes of face-to-face time with the patient. Greater than 50% of the time was spent counseling and coordinating care.      Lavena Bullion ,DO, FACG 03/07/2018, 8:53 AM

## 2018-03-07 NOTE — Patient Instructions (Addendum)
If you are age 80 or older, your body mass index should be between 23-30. Your Body mass index is 22.23 kg/m. If this is out of the aforementioned range listed, please consider follow up with your Primary Care Provider.  If you are age 31 or younger, your body mass index should be between 19-25. Your Body mass index is 22.23 kg/m. If this is out of the aformentioned range listed, please consider follow up with your Primary Care Provider.   You have been give a sample of  Motegrity 2mg   Please call Dr. De Hollingshead nurse Faythe Casa, RN)  In 2 weeks at [phone]  to let her know how you are doing.  It was a pleasure to see you today!  Vito Cirigliano, D.O.

## 2018-03-11 ENCOUNTER — Ambulatory Visit: Payer: Medicare Other | Admitting: Physical Therapy

## 2018-03-13 ENCOUNTER — Ambulatory Visit: Payer: Medicare Other | Admitting: Physical Therapy

## 2018-03-18 ENCOUNTER — Telehealth: Payer: Self-pay | Admitting: Gastroenterology

## 2018-03-18 ENCOUNTER — Ambulatory Visit: Payer: Medicare Other | Admitting: Physical Therapy

## 2018-03-18 DIAGNOSIS — J301 Allergic rhinitis due to pollen: Secondary | ICD-10-CM | POA: Diagnosis not present

## 2018-03-18 NOTE — Telephone Encounter (Signed)
Called and spoke with patient's daughter-[name]-verified DPR- [name] reports the patient had 2 very large bowel movements at the beginning of taking the Motegrity-has had a bowel movement every 2-3 days thereafter-would like to know if the patient should be taking the Miralax along with the [place]? Or alternative medications/recommendations; Please advise

## 2018-03-18 NOTE — Telephone Encounter (Signed)
Pt's daughter called, she would like to speak with you regarding motegrity and how it is working. Pls call her.

## 2018-03-19 ENCOUNTER — Other Ambulatory Visit: Payer: Self-pay | Admitting: Internal Medicine

## 2018-03-19 DIAGNOSIS — K219 Gastro-esophageal reflux disease without esophagitis: Secondary | ICD-10-CM

## 2018-03-19 DIAGNOSIS — A048 Other specified bacterial intestinal infections: Secondary | ICD-10-CM

## 2018-03-19 NOTE — Telephone Encounter (Signed)
Patient reports to the daughter and the daughter is calling into the office-[name]-verified DPR-patient still having abdominal pains (which have been reported as before)-having bowel movements every 3-4 days and does not feel like the Motegrity is working like it did to begin with (the first 2 days)-patient was advised by the urologist to consume around 3 L of water ([name] is unable to verify if the patient is actually drinking the recommended amount- he has not been taking the Miralax-[name] is unsure if the patient is straining with bowel movements-  Please advise on next step in plan of care-patient is requesting to be seen by MD if that is recommended (appt made for patient on 03/21/2018 arrival at 10:15am for a 10:30am appt-just in case an appt is appropriate);

## 2018-03-19 NOTE — Telephone Encounter (Signed)
If he is having a BM every 2-3 days but otherwise comfortable (ie, no abdominal pain, cramping, straining to have BM, etc), then ok to resume Motegrity alone for now. I will typically wait 7 days before adding back any laxative agents. If ongoing constipation sxs at that time, then ok to add back the Miralax and titrate to regular, soft stools without straining to have BM. Thanks.

## 2018-03-19 NOTE — Telephone Encounter (Signed)
Abdominal pain can be seen when starting the Motegrity and should fade in the first 7 days or so. Happy to see him in clinic as scheduled to discuss in detail and try to parse out new pain vs pre-existing pain. Thanks.

## 2018-03-19 NOTE — Telephone Encounter (Signed)
Called and spoke with patient's daughter [name]- informed her of MD recommendations-[name] is agreeable to keep appt scheduled with MD on 03/21/2018; she was advised to call back if questions/concerns arise;

## 2018-03-19 NOTE — Telephone Encounter (Signed)
Pt's daughter called for an update.  She also wanted to discuss pt's stomach pain.

## 2018-03-20 ENCOUNTER — Ambulatory Visit: Payer: Medicare Other | Admitting: Physical Therapy

## 2018-03-21 ENCOUNTER — Ambulatory Visit: Payer: Medicare Other | Admitting: Gastroenterology

## 2018-03-21 ENCOUNTER — Encounter: Payer: Self-pay | Admitting: Gastroenterology

## 2018-03-21 ENCOUNTER — Telehealth: Payer: Self-pay | Admitting: Gastroenterology

## 2018-03-21 VITALS — BP 138/90 | HR 80 | Ht 71.0 in | Wt 159.0 lb

## 2018-03-21 DIAGNOSIS — K5900 Constipation, unspecified: Secondary | ICD-10-CM | POA: Diagnosis not present

## 2018-03-21 DIAGNOSIS — G20 Parkinson's disease: Secondary | ICD-10-CM

## 2018-03-21 DIAGNOSIS — K219 Gastro-esophageal reflux disease without esophagitis: Secondary | ICD-10-CM

## 2018-03-21 DIAGNOSIS — K8020 Calculus of gallbladder without cholecystitis without obstruction: Secondary | ICD-10-CM | POA: Diagnosis not present

## 2018-03-21 MED ORDER — PRUCALOPRIDE SUCCINATE 2 MG PO TABS: 2.0000 mg | ORAL_TABLET | Freq: Every day | ORAL | 11 refills | Status: DC

## 2018-03-21 NOTE — Telephone Encounter (Signed)
Bre can you get this set up for Henry Adams. I will call the patient and work on his PA.

## 2018-03-21 NOTE — Progress Notes (Signed)
P  Chief Complaint:    Constipation  GI History: 78 year old male initially seen by me on 10/10/2017 (previous patient of Dr. Deatra Ina, last seen by him in 2009) for chronic constipation, and most recently seen on 03/07/2018.  See note dated 10/10/2017 for historical data.  At his last appointment, he was back to having BM every 4 to 5 days (was every 2-3 days) despite taking Linzess.  Had added MiraLAX back every other day.  Given ongoing symptoms that were suspected secondary to underlying Parkinsonism (both the diagnosis and medications carry increased incidence of constipation), was started on Motegrity.  Of note, had endorsed exacerbation of constipation in 2019, which seems temporally related to starting Sinemet in 01/2017, with up titration to 3 times daily dosing in spring 2019 for newly diagnosed Parkinsonism.  Added carbidopa/levodopa CR nightly and 02/2017.  Treated with continued Linzess 290 mcg/day, started MiraLAX 1 cap daily, with consideration to add Colace 100 mg as needed.  Otherwise, continued high fluid/water intake and high-fiber diet.  Owing to ongoing constipation, was changed to [facility] and 02/2018.  Was additionally seen by general surgery on 03/04/2018 for cholelithiasis (ultrasound was 20 mm GB stone without cholecystitis or choledocholithiasis).  Surgeon clinically agreed that abdominal symptoms seemed more related to his underlying constipation rather than biliary in etiology, and ultimately recommended against cholecystectomy.  As previously indicated that he does not want any endoscopic evaluation unless absolutely necessary.  HPI:     Patient is a 78 y.o. male presenting [facility] for follow-up.  He was last seen by me on 03/07/2018 with ongoing/return of constipation.  He is a longstanding history of CIC which I suspect exacerbated with recent diagnosis of Parkinsonism/Parkinson medications.  Was started on Motegrity at his last appointment.  He  again presents with his daughter to assist in providing history.  Today he states, the [place] worked well for approx 1 week, but now back to constipation, hard stools and straining. Added Miralax back this week, with soft stool 2 days ago.  Continues to have generalized abdominal pain, worse when constipation heightens.  This is unchanged from previous.  Otherwise, no nausea, vomiting, hematochezia.   Review of systems:     No chest pain, no SOB, no fevers, no urinary sx   Past Medical History:  Diagnosis Date  . Allergy    perennial  . Anxiety   . Arthritis   . Cholelithiasis   . CKD (chronic kidney disease)   . Depression   . Diverticulosis   . Enlarged prostate   . Gallstones   . GERD (gastroesophageal reflux disease)   . HLD (hyperlipidemia)   . Hx of cardiovascular stress test    ETT-Myoview 6/14: probable low risk study, inf defect with normal inf wall motion-cannot r/o ischemia; prominent gut uptake adjacent to inf wall on rest images-poss defect is atten, EF 63%  . Hypertension   . Melanoma ([place])   . Polycystic kidney disease     Patient's surgical history, family medical history, social history, medications and allergies were all reviewed in Epic    Current Outpatient Medications  Medication Sig Dispense Refill  . buPROPion (WELLBUTRIN XL) 150 MG 24 hr tablet Take 1 tablet (150 mg total) by mouth daily. 30 tablet 5  . carbidopa-levodopa (SINEMET IR) 25-100 MG tablet 1 tablet at 7am/11am/3pm and 1.5 at 7pm 405 tablet 1  . Carbidopa-Levodopa ER (SINEMET CR) 25-100 MG tablet controlled release Take 1 tablet by mouth at bedtime.  90 tablet 1  . clonazePAM (KLONOPIN) 0.5 MG tablet Take 0.5 tablets (0.25 mg total) by mouth at bedtime. 45 tablet 0  . fexofenadine (ALLEGRA) 180 MG tablet Take 180 mg by mouth daily.      . meclizine (ANTIVERT) 12.5 MG tablet Take 1 tablet (12.5 mg total) by mouth 3 (three) times daily as needed for dizziness. 30 tablet 0  . omeprazole  (PRILOSEC) 40 MG capsule TAKE (1) CAPSULE DAILY. 90 capsule 0  . Polyethylene Glycol 3350 (MIRALAX PO) Take by mouth as needed.    . Prucalopride Succinate (MOTEGRITY) 2 MG TABS Take 2 mg by mouth daily. 14 tablet 0  . Tolvaptan (JYNARQUE PO) Take by mouth daily. 7am takes 45mg  and at 3 takes the 15mg     . vitamin B-12 (CYANOCOBALAMIN) 1000 MCG tablet Take 1,000 mcg by mouth daily.    [name] linaclotide (LINZESS) 290 MCG CAPS capsule Take 1 capsule (290 mcg total) by mouth daily before breakfast. (Patient not taking: Reported on 03/21/2018) 90 capsule 1   No current facility-administered medications for this visit.     Physical Exam:     BP 138/90   Pulse 80   Ht 5\' 11"  (1.803 m)   Wt 159 lb (72.1 kg)   BMI 22.18 kg/m   GENERAL:  Pleasant male in NAD PSYCH: : Cooperative, normal affect EENT:  conjunctiva pink, mucous membranes moist, neck supple without masses CARDIAC:  RRR, no murmur heard, no peripheral edema PULM: Normal respiratory effort, lungs CTA bilaterally, no wheezing ABDOMEN:  Nondistended, soft.  Mild generalized TTP without rebound or guarding.  No peritoneal signs.  No obvious masses, no hepatomegaly,  normal bowel sounds SKIN:  turgor, no lesions seen Musculoskeletal:  Normal muscle tone, normal strength NEURO: Alert and oriented x 3, no focal neurologic deficits   IMPRESSION and PLAN:    1)Constipation: History of Chronic Idiopathic Constipation with exacerbation in the last year or so likely multifactorial 2/2 underlying Parkinsonism and Parkinson medications (Sinemet has 6% ADR of constipation).  Had initially responded to [place] with addition of MiraLAX, but decreasing efficacy and return to index symptoms of constipation and straining.  Changed to [place] which initially improved symptoms, but now with straining again, adding back his MiraLAX earlier this week with clinical improvement.  We will plan on treating as below:  - Clenpiq for bowel cleanout.   Encouraged to drink plenty of fluids (64 ounces) with his bowel prep keep euvolemia -Resume Motegrity -Following Clenpiq, start MiraLAX twice daily and titrate to soft, regular stools without straining to have a BM - Discussed trial of true Lance, but he prefers to stay off this for now due to history of PKD - Start fiber supplement -If having regular stools without straining, can reduceMiraLAXagain and consider  Colace 100 mg 1-2times/day - Continue high fluid/water intake daily with at least 8 glasses of 8 oz per day - Continue high fiber diet - If no improvement with the above intervention, can again consider colonoscopy to rule out intraluminal etiology, which he is indicated he only wants done if absolutely necessary  2) Colon Cancer screening: Last colonoscopy 10 years ago without any polyps. As previously discussed, patient does not want to proceed with repeat colonoscopies for colon cancer screening. Given age and comorbidities, reasonable to stop colonoscopy for routine colon cancer screening. Would only repeat colonoscopy for GI symptoms as noted above.  3) GERD: Well-controlled with Prilosec. No change in medications at this time.  4)Cholelithiasis: Recent ultrasound 2  cm stone with otherwise normal HIDA.   Has been seen by [facility] and no plan for elective cholecystectomy.  5) Parkinsonism: -Follows closely with neurology - Does have related depression, particularly related to wife's transition to memory care living.  He does follow with a Social worker.  No SI.  RTC in 2-3 month or sooner as needed.  I spent a total of 15 minutes of face-to-face time with the patient. Greater than 50% of the time was spent counseling and coordinating care.    [place] ,DO, FACG 03/21/2018, 11:19 AM

## 2018-03-21 NOTE — Patient Instructions (Signed)
If you are age 80 or older, your body mass index should be between 23-30. Your Body mass index is 22.18 kg/m. If this is out of the aforementioned range listed, please consider follow up with your Primary Care Provider.  If you are age 75 or younger, your body mass index should be between 19-25. Your Body mass index is 22.18 kg/m. If this is out of the aformentioned range listed, please consider follow up with your Primary Care Provider.   We have sent the following medications to your pharmacy for you to pick up at your convenience: Motegrity 2mg  daily. Miralax 1 capful mixed with water twice a day.  You have been give a sample of  Clenpiq for bowel purge.  Please start taking a daily fiber supplement such as citrucel, benefiber, metamucil, or fiber choice.   Please call our office at [phone] to set up your 2-3 month follow up visit.   It was a pleasure to see you today!  Vito Cirigliano, D.O.

## 2018-03-21 NOTE — Telephone Encounter (Signed)
The samples that were requested (30 day supply) are at the front desk (labeled) and ready for patient pick up;

## 2018-03-21 NOTE — Telephone Encounter (Signed)
Pt daughter called in and stated that the medication that was called in Prucalopride Succinate (MOTEGRITY) 2 MG TABS [[id number]] Needs an Authorization and her father is out. She wants to know if she can come to the [place] location and pick up some samples until the medication gets authorized?

## 2018-03-24 ENCOUNTER — Ambulatory Visit: Payer: Self-pay | Admitting: Internal Medicine

## 2018-03-24 ENCOUNTER — Ambulatory Visit: Payer: Self-pay

## 2018-03-24 NOTE — Telephone Encounter (Signed)
Daughter, [name] Ranson called in about her father.   He started coughing last night and this morning has a fever.   He also has nasal congestion.  She was requesting OTC medications to try until he sees the doctor tomorrow.   I went over the care advice for home with her.  She said she would take him to the [facility] if he gets worse before tomorrow.  I also suggested she check with the pharmacist.   They are a great resource for OTC medications too.  She was agreeable to this plan.    Reason for Disposition . Care advice for mild cough, questions about  Answer Assessment - Initial Assessment Questions 1. ONSET: "When did the nasal discharge start?"      Daughter, [name] Ranson called in.   101.6 fever this morning.   Started coughing last night.  She gave him 2 Tylenol.    2. AMOUNT: "How much discharge is there?"      Very congested in his nose.   Sore throat.   No earache.   She is not with her father for this call.   3. COUGH: "Do you have a cough?" If yes, ask: "Describe the color of your sputum" (clear, white, yellow, green)     Cough started last night.   Not coughing up anything. 4. RESPIRATORY DISTRESS: "Describe your breathing."      No mention of breathing difficulty other than breathing through his mouth because nose is congested. 5. FEVER: "Do you have a fever?" If so, ask: "What is your temperature, how was it measured, and when did it start?"     Yes 101.6 6. SEVERITY: "Overall, how bad are you feeling right now?" (e.g., doesn't interfere with normal activities, staying home from school/work, staying in bed)      My husband has been sick not with the flu. Not that I have noticed per daughter.   7. OTHER SYMPTOMS: "Do you have any other symptoms?" (e.g., sore throat, earache, wheezing, vomiting)     No diarrhea or vomiting. 8. PREGNANCY: "Is there any chance you are pregnant?" "When was your last menstrual period?"     N/A  Protocols used: COMMON COLD-A-AH

## 2018-03-24 NOTE — Telephone Encounter (Signed)
Opened by mistake.

## 2018-03-25 ENCOUNTER — Encounter: Payer: Self-pay | Admitting: Internal Medicine

## 2018-03-25 ENCOUNTER — Ambulatory Visit: Payer: Medicare Other | Admitting: Internal Medicine

## 2018-03-25 ENCOUNTER — Ambulatory Visit (INDEPENDENT_AMBULATORY_CARE_PROVIDER_SITE_OTHER)
Admission: RE | Admit: 2018-03-25 | Discharge: 2018-03-25 | Disposition: A | Discharge status: Home / Self Care | Payer: Medicare Other | Source: Ambulatory Visit | Attending: Internal Medicine | Admitting: Internal Medicine

## 2018-03-25 VITALS — BP 130/90 | HR 85 | Temp 98.7°F | Ht 71.0 in | Wt 157.0 lb

## 2018-03-25 DIAGNOSIS — R059 Cough, unspecified: Secondary | ICD-10-CM | POA: Insufficient documentation

## 2018-03-25 DIAGNOSIS — R6889 Other general symptoms and signs: Secondary | ICD-10-CM

## 2018-03-25 DIAGNOSIS — J101 Influenza due to other identified influenza virus with other respiratory manifestations: Secondary | ICD-10-CM | POA: Insufficient documentation

## 2018-03-25 DIAGNOSIS — R05 Cough: Secondary | ICD-10-CM | POA: Diagnosis not present

## 2018-03-25 LAB — POC INFLUENZA A&B (BINAX/QUICKVUE)
Influenza A, POC: POSITIVE — AB
Influenza B, POC: NEGATIVE

## 2018-03-25 MED ORDER — PROMETHAZINE-DM 6.25-15 MG/5ML PO SYRP: 5.0000 mL | ORAL_SOLUTION | Freq: Four times a day (QID) | ORAL | 0 refills | Status: DC | PRN

## 2018-03-25 MED ORDER — OSELTAMIVIR PHOSPHATE 75 MG PO CAPS: 75.0000 mg | ORAL_CAPSULE | Freq: Two times a day (BID) | ORAL | 0 refills | Status: DC

## 2018-03-25 NOTE — Patient Instructions (Signed)

## 2018-03-25 NOTE — Progress Notes (Signed)
Subjective:  Patient ID: Henry Adams, male    DOB: [birth year]  Age: 78 y.o. MRN: [id number]  CC: Cough   HPI DELANO FRATE presents for a 2-day history of nonproductive cough, fever, chills, runny nose, nasal congestion, and laryngitis.  Outpatient Medications Prior to Visit  Medication Sig Dispense Refill  . buPROPion (WELLBUTRIN XL) 150 MG 24 hr tablet Take 1 tablet (150 mg total) by mouth daily. 30 tablet 5  . carbidopa-levodopa (SINEMET IR) 25-100 MG tablet 1 tablet at 7am/11am/3pm and 1.5 at 7pm 405 tablet 1  . Carbidopa-Levodopa ER (SINEMET CR) 25-100 MG tablet controlled release Take 1 tablet by mouth at bedtime. 90 tablet 1  . clonazePAM (KLONOPIN) 0.5 MG tablet Take 0.5 tablets (0.25 mg total) by mouth at bedtime. 45 tablet 0  . fexofenadine (ALLEGRA) 180 MG tablet Take 180 mg by mouth daily.      . meclizine (ANTIVERT) 12.5 MG tablet Take 1 tablet (12.5 mg total) by mouth 3 (three) times daily as needed for dizziness. 30 tablet 0  . omeprazole (PRILOSEC) 40 MG capsule TAKE (1) CAPSULE DAILY. 90 capsule 0  . Polyethylene Glycol 3350 (MIRALAX PO) Take by mouth as needed.    . Prucalopride Succinate (MOTEGRITY) 2 MG TABS Take 2 mg by mouth daily. 30 tablet 11  . Tolvaptan (JYNARQUE PO) Take by mouth daily. 7am takes 45mg  and at 3 takes the 15mg     . vitamin B-12 (CYANOCOBALAMIN) 1000 MCG tablet Take 1,000 mcg by mouth daily.    [name] linaclotide (LINZESS) 290 MCG CAPS capsule Take 1 capsule (290 mcg total) by mouth daily before breakfast. (Patient not taking: Reported on 03/21/2018) 90 capsule 1   No facility-administered medications prior to visit.     ROS Review of Systems  Constitutional: Positive for chills and fever. Negative for diaphoresis and fatigue.  HENT: Positive for voice change. Negative for facial swelling, sinus pressure, sore throat and trouble swallowing.   Respiratory: Positive for cough. Negative for chest tightness, shortness of breath and wheezing.     Cardiovascular: Negative for chest pain, palpitations and leg swelling.  Gastrointestinal: Negative for abdominal pain, diarrhea, nausea and vomiting.  Endocrine: Negative.   Genitourinary: Negative.  Negative for difficulty urinating and dysuria.  Musculoskeletal: Negative for arthralgias and myalgias.  Skin: Negative.  Negative for rash.  Neurological: Negative.  Negative for dizziness, weakness and light-headedness.  Hematological: Negative for adenopathy. Does not bruise/bleed easily.  Psychiatric/Behavioral: Negative.     Objective:  BP 130/90 (BP Location: Left Arm, Patient Position: Sitting, Cuff Size: Normal)   Pulse 85   Temp 98.7 F (37.1 C) (Oral)   Ht 5\' 11"  (1.803 m)   Wt 157 lb (71.2 kg)   SpO2 96%   BMI 21.90 kg/m   BP Readings from Last 3 Encounters:  03/25/18 130/90  03/21/18 138/90  03/07/18 136/88    Wt Readings from Last 3 Encounters:  03/25/18 157 lb (71.2 kg)  03/21/18 159 lb (72.1 kg)  03/07/18 159 lb 6 oz (72.3 kg)    Physical Exam Vitals signs reviewed.  Constitutional:      General: He is not in acute distress.    Appearance: He is not ill-appearing, toxic-appearing or diaphoretic.  HENT:     Nose: Nose normal. No congestion or rhinorrhea.     Mouth/Throat:     Pharynx: Oropharynx is clear. No oropharyngeal exudate or posterior oropharyngeal erythema.  Eyes:     General: No scleral icterus.  Conjunctiva/sclera: Conjunctivae normal.  Neck:     Musculoskeletal: Normal range of motion and neck supple.  Cardiovascular:     Rate and Rhythm: Normal rate and regular rhythm.     Heart sounds: No murmur. No gallop.   Pulmonary:     Effort: Pulmonary effort is normal.     Breath sounds: Normal breath sounds. No stridor. No wheezing, rhonchi or rales.  Abdominal:     General: Abdomen is flat. Bowel sounds are normal.     Palpations: There is no hepatomegaly, splenomegaly or mass.     Tenderness: There is no abdominal tenderness.   Musculoskeletal: Normal range of motion.        General: No swelling.     Right lower leg: No edema.     Left lower leg: No edema.  Skin:    General: Skin is warm and dry.     Coloration: Skin is not pale.     Findings: No erythema.     Lab Results  Component Value Date   WBC 4.4 11/12/2017   HGB 14.4 11/12/2017   HCT 42.2 11/12/2017   PLT 167.0 11/12/2017   GLUCOSE 85 11/12/2017   CHOL 179 11/14/2016   TRIG 73.0 11/14/2016   HDL 68.50 11/14/2016   LDLDIRECT 131.7 07/02/2011   LDLCALC 96 11/14/2016   ALT 8 11/12/2017   AST 14 11/12/2017   NA 134 (L) 11/12/2017   K 3.5 11/12/2017   CL 98 11/12/2017   CREATININE 1.59 (H) 11/12/2017   BUN 22 11/12/2017   CO2 31 11/12/2017   TSH 1.64 11/12/2017   PSA 2.28 08/11/2015   INR 1.1 (H) 08/01/2012   HGBA1C 5.9 12/29/2009    Dg Abd Acute W/chest  Result Date: 12/25/2017 CLINICAL DATA:  Abdominal distention. Right flank abdominal wall mass. EXAM: DG ABDOMEN ACUTE W/ 1V CHEST COMPARISON:  08/19/2017 FINDINGS: Large volume stool to distending the colon diffusely. No evidence of small bowel obstruction. No definite rectal impaction. No concerning mass effect or calcification. Normal heart size and mediastinal contours. No acute infiltrate or edema. No effusion or pneumothorax. No acute osseous findings. IMPRESSION: Constipation with extensive stool retention. Electronically Signed   By: Monte Fantasia M.D.   On: 12/25/2017 10:57    Dg Chest 2 View  Result Date: 03/25/2018 CLINICAL DATA:  Cough and fever for 3 days. History of hypertension. EXAM: CHEST - 2 VIEW COMPARISON:  12/25/2017 and older exams. FINDINGS: Cardiac silhouette is normal in size. No mediastinal or hilar masses no evidence of adenopathy. Lungs are hyperexpanded but clear. No pleural effusion or pneumothorax. Skeletal structures are intact. IMPRESSION: No active cardiopulmonary disease. Electronically Signed   By: Lajean Manes M.D.   On: 03/25/2018 11:37     Assessment & Plan:   Keshun was seen today for cough.  Diagnoses and all orders for this visit:  Cough- His chest x-ray is negative for infiltrate. -     DG Chest 2 View; Future -     promethazine-dextromethorphan (PROMETHAZINE-DM) 6.25-15 MG/5ML syrup; Take 5 mLs by mouth 4 (four) times daily as needed for up to 7 days for cough. -     POC Influenza A&B (Binax test)  Influenza A- Screening is positive for influenza A.  Will treat with a 5-day course of Tamiflu and will control the cough with a combination of promethazine and dextromethorphan. -     oseltamivir (TAMIFLU) 75 MG capsule; Take 1 capsule (75 mg total) by mouth 2 (two) times daily  for 5 days. -     promethazine-dextromethorphan (PROMETHAZINE-DM) 6.25-15 MG/5ML syrup; Take 5 mLs by mouth 4 (four) times daily as needed for up to 7 days for cough.  Flu-like symptoms -     POC Influenza A&B (Binax test)   I have discontinued Josie Saunders. Elms's linaclotide. I am also having him start on oseltamivir and promethazine-dextromethorphan. Additionally, I am having him maintain his fexofenadine, buPROPion, vitamin B-12, meclizine, Tolvaptan (JYNARQUE PO), clonazePAM, Carbidopa-Levodopa ER, carbidopa-levodopa, Polyethylene Glycol 3350 (MIRALAX PO), omeprazole, and Prucalopride Succinate.  Meds ordered this encounter  Medications  . oseltamivir (TAMIFLU) 75 MG capsule    Sig: Take 1 capsule (75 mg total) by mouth 2 (two) times daily for 5 days.    Dispense:  10 capsule    Refill:  0  . promethazine-dextromethorphan (PROMETHAZINE-DM) 6.25-15 MG/5ML syrup    Sig: Take 5 mLs by mouth 4 (four) times daily as needed for up to 7 days for cough.    Dispense:  118 mL    Refill:  0     Follow-up: Return if symptoms worsen or fail to improve.  Scarlette Calico, MD

## 2018-03-26 ENCOUNTER — Ambulatory Visit: Payer: Self-pay | Admitting: Internal Medicine

## 2018-03-26 NOTE — Telephone Encounter (Signed)
Per PCP - informed dtr to stop the tamiflu and to let us know if he begins to feel worse. Also instructed to push fluids and lots of rest for the pt.

## 2018-03-26 NOTE — Telephone Encounter (Signed)
Henry Adams, daughter called in regarding the pt. He was started on Tamiflu yesterday for Flu strain A.    His first dose was 1:00 PM yesterday.   Last night he was confused and hallucinating.   He got up during the night and took all his medications he was suppose to take in the morning and does not remember doing it.     He is still confused and hallucinating now.   See triage notes for more details.  I have sent a high priority note to Dr. Ronnald Ramp office for further disposition.    Reason for Disposition . Caller has URGENT medication question about med that PCP prescribed and triager unable to answer question  Answer Assessment - Initial Assessment Questions 1. SYMPTOMS: "Do you have any symptoms?"     Henry Adams, daughter calling in.   He took Tamiflu at 1:00 PM yesterday and last night he was confused and had hallucinating.      Dr. Ronnald Ramp also prescribed Promethazine for cough and he took it when he got home from doctor yesterday.      He just woke up an hour ago from a nap and said "He saw something under his sheets breathing".        He is on Levodopa.  While confused last night he got up and took his medications during the night.    He takes a short acting Levodopa  That he took at  7:00 AM,    Took a short acting Levodopa  at 8:30 PM, then at 10:00PM took a long acting Levodopa.Then at  2:30 AM he took a short acting Levodopa.    2. SEVERITY: If symptoms are present, ask "Are they mild, moderate or severe?"     Severe confused today and hallucinating.  Protocols used: MEDICATION QUESTION CALL-A-AH

## 2018-03-27 ENCOUNTER — Ambulatory Visit (INDEPENDENT_AMBULATORY_CARE_PROVIDER_SITE_OTHER)
Admission: RE | Admit: 2018-03-27 | Discharge: 2018-03-27 | Disposition: A | Discharge status: Home / Self Care | Payer: Medicare Other | Source: Ambulatory Visit | Attending: Internal Medicine | Admitting: Internal Medicine

## 2018-03-27 ENCOUNTER — Ambulatory Visit: Payer: Self-pay

## 2018-03-27 ENCOUNTER — Encounter: Payer: Self-pay | Admitting: Internal Medicine

## 2018-03-27 ENCOUNTER — Ambulatory Visit (INDEPENDENT_AMBULATORY_CARE_PROVIDER_SITE_OTHER): Payer: Medicare Other | Admitting: Internal Medicine

## 2018-03-27 ENCOUNTER — Other Ambulatory Visit (INDEPENDENT_AMBULATORY_CARE_PROVIDER_SITE_OTHER): Payer: Medicare Other

## 2018-03-27 ENCOUNTER — Telehealth: Payer: Self-pay

## 2018-03-27 VITALS — BP 152/90 | HR 82 | Temp 98.1°F | Resp 16 | Ht 71.0 in | Wt 156.8 lb

## 2018-03-27 DIAGNOSIS — R410 Disorientation, unspecified: Secondary | ICD-10-CM

## 2018-03-27 DIAGNOSIS — N183 Chronic kidney disease, stage 3 unspecified: Secondary | ICD-10-CM | POA: Insufficient documentation

## 2018-03-27 DIAGNOSIS — I10 Essential (primary) hypertension: Secondary | ICD-10-CM

## 2018-03-27 DIAGNOSIS — R10817 Generalized abdominal tenderness: Secondary | ICD-10-CM

## 2018-03-27 DIAGNOSIS — R140 Abdominal distension (gaseous): Secondary | ICD-10-CM | POA: Diagnosis not present

## 2018-03-27 LAB — POC URINALSYSI DIPSTICK (AUTOMATED)
Bilirubin, UA: NEGATIVE
Blood, UA: NEGATIVE
Glucose, UA: NEGATIVE
KETONES UA: NEGATIVE
Leukocytes, UA: NEGATIVE
Nitrite, UA: NEGATIVE
Protein, UA: NEGATIVE
Spec Grav, UA: 1.015 (ref 1.010–1.025)
Urobilinogen, UA: 0.2 E.U./dL
pH, UA: 6 (ref 5.0–8.0)

## 2018-03-27 LAB — CBC WITH DIFFERENTIAL/PLATELET
Basophils Absolute: 0.1 10*3/uL (ref 0.0–0.1)
Basophils Relative: 1.2 % (ref 0.0–3.0)
EOS PCT: 1.8 % (ref 0.0–5.0)
Eosinophils Absolute: 0.1 10*3/uL (ref 0.0–0.7)
HCT: 41.7 % (ref 39.0–52.0)
Hemoglobin: 14 g/dL (ref 13.0–17.0)
Lymphocytes Relative: 32.4 % (ref 12.0–46.0)
Lymphs Abs: 1.5 10*3/uL (ref 0.7–4.0)
MCHC: 33.5 g/dL (ref 30.0–36.0)
MCV: 95.2 fl (ref 78.0–100.0)
Monocytes Absolute: 0.7 10*3/uL (ref 0.1–1.0)
Monocytes Relative: 14.8 % — ABNORMAL HIGH (ref 3.0–12.0)
Neutro Abs: 2.3 10*3/uL (ref 1.4–7.7)
Neutrophils Relative %: 49.8 % (ref 43.0–77.0)
Platelets: 137 10*3/uL — ABNORMAL LOW (ref 150.0–400.0)
RBC: 4.38 Mil/uL (ref 4.22–5.81)
RDW: 14.3 % (ref 11.5–15.5)
WBC: 4.5 10*3/uL (ref 4.0–10.5)

## 2018-03-27 LAB — COMPREHENSIVE METABOLIC PANEL
ALT: 9 U/L (ref 0–53)
AST: 23 U/L (ref 0–37)
Albumin: 4 g/dL (ref 3.5–5.2)
Alkaline Phosphatase: 72 U/L (ref 39–117)
BUN: 21 mg/dL (ref 6–23)
CO2: 30 mEq/L (ref 19–32)
Calcium: 8.3 mg/dL — ABNORMAL LOW (ref 8.4–10.5)
Chloride: 102 mEq/L (ref 96–112)
Creatinine, Ser: 1.77 mg/dL — ABNORMAL HIGH (ref 0.40–1.50)
GFR: 37.19 mL/min — ABNORMAL LOW (ref >60.00)
Glucose, Bld: 73 mg/dL (ref 70–99)
Potassium: 3.6 mEq/L (ref 3.5–5.1)
Sodium: 139 mEq/L (ref 135–145)
Total Bilirubin: 0.5 mg/dL (ref 0.2–1.2)
Total Protein: 7.3 g/dL (ref 6.0–8.3)

## 2018-03-27 LAB — LIPASE: Lipase: 44 U/L (ref 11.0–59.0)

## 2018-03-27 NOTE — Telephone Encounter (Signed)
[employer] denied the patients Motegrity because he has not failed on Amitiza yet. Would you like to change his Rx? Please Advise.

## 2018-03-27 NOTE — Telephone Encounter (Signed)
Daughter called to report worsening confusion of pt. in past 48 hrs.  Last dose of Tamiflu was 2:30 AM on 03/25/18.  Last dose of cough medication, Promethazine-Dextromethorphan, was 10:00 PM on 03/25/18.  Stated he took "several doses" of Carbidopa-Levodopa overnight on Tuesday.  Reported no agitation noted.  Stated pt. reported seeing someone sitting in room, and laying in bed with him, then when he looks again, they are gone.  Daughter denied any other Neurological changes; denied signs of facial droop, slurred speech, or weakness of extremities.  Reported he has nasal congestion.  Denied any coughing, shortness of breath, or fever.  Appt. Scheduled today with PCP at 1:30 PM.  Daughter was advised to take pt. To the ER, if his symptoms worsen, prior to appt.  Verb. Understanding.       Reason for Disposition . [1] Longstanding confusion (e.g., dementia, stroke) AND [2] worsening  Answer Assessment - Initial Assessment Questions 1. LEVEL OF CONSCIOUSNESS: "How is he (she, the patient) acting right now?" (e.g., alert-oriented, confused, lethargic, stuporous, comatose)     Having hallucinations-sees someone sitting in room; someone laying in bed with him 2. ONSET: "When did the confusion start?"  (minutes, hours, days)     Has had confusion at baseline, but the confusion has increased in past 48 hrs.  3. PATTERN "Does this come and go, or has it been constant since it started?"  "Is it present now?"     continuous 4. ALCOHOL or DRUGS: "Has he been drinking alcohol or taking any drugs?"      No  5. NARCOTIC MEDICATIONS: "Has he been receiving any narcotic medications?" (e.g., morphine, Vicodin)     *No Answer* 6. CAUSE: "What do you think is causing the confusion?"      Unsure; was on Tamiflu; last dose was 2:30 AM on Thurs.  7. OTHER SYMPTOMS: "Are there any other symptoms?" (e.g., difficulty breathing, headache, fever, weakness)     Nasal congestion, denied any labored breathing or shortness of  breath, denied cough, no fever.  C/o intermittent indigestion at lower end of sternum. Pt. Offers no other complaints.  Protocols used: [facility]ION [facility]

## 2018-03-27 NOTE — Progress Notes (Signed)
Subjective:  Patient ID: Henry Adams, male    DOB: October 29, 1940  Age: 78 y.o. MRN: [id number]  CC: Abdominal Pain   HPI Henry Adams presents for f/up - He was diagnosed with influenza A 2 days ago.  His daughter brings him back in today because she is concerned about confusion over the last day.  He continues to have chills.  She decided to stop giving him Tamiflu.  The cough has resolved.  He continues to complain of constipation and feels like his abdomen is bloated.  It sounds like he is taken the Motegrity that the gastroenterologist prescribed.  Outpatient Medications Prior to Visit  Medication Sig Dispense Refill  . buPROPion (WELLBUTRIN XL) 150 MG 24 hr tablet Take 1 tablet (150 mg total) by mouth daily. 30 tablet 5  . carbidopa-levodopa (SINEMET IR) 25-100 MG tablet 1 tablet at 7am/11am/3pm and 1.5 at 7pm 405 tablet 1  . Carbidopa-Levodopa ER (SINEMET CR) 25-100 MG tablet controlled release Take 1 tablet by mouth at bedtime. 90 tablet 1  . clonazePAM (KLONOPIN) 0.5 MG tablet Take 0.5 tablets (0.25 mg total) by mouth at bedtime. 45 tablet 0  . fexofenadine (ALLEGRA) 180 MG tablet Take 180 mg by mouth daily.      . meclizine (ANTIVERT) 12.5 MG tablet Take 1 tablet (12.5 mg total) by mouth 3 (three) times daily as needed for dizziness. 30 tablet 0  . omeprazole (PRILOSEC) 40 MG capsule TAKE (1) CAPSULE DAILY. 90 capsule 0  . oxymetazoline (AFRIN) 0.05 % nasal spray Place 1 spray into both nostrils 2 (two) times daily.    . Polyethylene Glycol 3350 (MIRALAX PO) Take by mouth as needed.    . Prucalopride Succinate (MOTEGRITY) 2 MG TABS Take 2 mg by mouth daily. 30 tablet 11  . Tolvaptan (JYNARQUE PO) Take by mouth daily. 7am takes 45mg  and at 3 takes the 15mg     . vitamin B-12 (CYANOCOBALAMIN) 1000 MCG tablet Take 1,000 mcg by mouth daily.    [name] oseltamivir (TAMIFLU) 75 MG capsule Take 1 capsule (75 mg total) by mouth 2 (two) times daily for 5 days. 10 capsule 0  .  promethazine-dextromethorphan (PROMETHAZINE-DM) 6.25-15 MG/5ML syrup Take 5 mLs by mouth 4 (four) times daily as needed for up to 7 days for cough. 118 mL 0   No facility-administered medications prior to visit.     ROS Review of Systems  Constitutional: Positive for chills and fatigue. Negative for diaphoresis, fever and unexpected weight change.  HENT: Negative.   Eyes: Negative for visual disturbance.  Respiratory: Negative for cough, chest tightness, shortness of breath and wheezing.   Cardiovascular: Negative for chest pain, palpitations and leg swelling.  Gastrointestinal: Positive for abdominal distention and constipation. Negative for abdominal pain, blood in stool, diarrhea, nausea and vomiting.  Genitourinary: Negative.  Negative for difficulty urinating and dysuria.  Musculoskeletal: Negative.   Skin: Negative.  Negative for rash.  Neurological: Negative.  Negative for dizziness, weakness, light-headedness, numbness and headaches.  Hematological: Negative for adenopathy. Does not bruise/bleed easily.  Psychiatric/Behavioral: Positive for confusion and decreased concentration. Negative for agitation, behavioral problems, sleep disturbance and suicidal ideas. The patient is not nervous/anxious.     Objective:  BP (!) 152/90 (BP Location: Left Arm, Patient Position: Sitting, Cuff Size: Normal)   Pulse 82   Temp 98.1 F (36.7 C) (Oral)   Resp 16   Ht 5\' 11"  (1.803 m)   Wt 156 lb 12 oz (71.1 kg)   SpO2  97%   BMI 21.86 kg/m   BP Readings from Last 3 Encounters:  03/27/18 (!) 152/90  03/25/18 130/90  03/21/18 138/90    Wt Readings from Last 3 Encounters:  03/27/18 156 lb 12 oz (71.1 kg)  03/25/18 157 lb (71.2 kg)  03/21/18 159 lb (72.1 kg)    Physical Exam Vitals signs reviewed.  Constitutional:      General: He is not in acute distress.    Appearance: He is not ill-appearing, toxic-appearing or diaphoretic.  HENT:     Mouth/Throat:     Mouth: Mucous membranes  are moist.     Pharynx: No pharyngeal swelling.  Eyes:     General: No scleral icterus.    Conjunctiva/sclera: Conjunctivae normal.  Cardiovascular:     Heart sounds: Normal heart sounds. No murmur. No gallop.   Pulmonary:     Effort: Pulmonary effort is normal. No respiratory distress.     Breath sounds: No stridor. No wheezing, rhonchi or rales.  Abdominal:     General: Abdomen is protuberant. Bowel sounds are decreased.     Palpations: Abdomen is soft.     Tenderness: There is abdominal tenderness in the right upper quadrant and epigastric area.     Hernia: No hernia is present.  Musculoskeletal: Normal range of motion.        General: No swelling.     Right lower leg: No edema.     Left lower leg: No edema.  Skin:    General: Skin is warm and dry.     Coloration: Skin is not jaundiced or pale.     Findings: No erythema or rash.  Neurological:     General: No focal deficit present.     Mental Status: Mental status is at baseline.  Psychiatric:        Mood and Affect: Mood normal.        Behavior: Behavior normal.     Lab Results  Component Value Date   WBC 4.5 03/27/2018   HGB 14.0 03/27/2018   HCT 41.7 03/27/2018   PLT 137.0 (L) 03/27/2018   GLUCOSE 73 03/27/2018   CHOL 179 11/14/2016   TRIG 73.0 11/14/2016   HDL 68.50 11/14/2016   LDLDIRECT 131.7 07/02/2011   LDLCALC 96 11/14/2016   ALT 9 03/27/2018   AST 23 03/27/2018   NA 139 03/27/2018   K 3.6 03/27/2018   CL 102 03/27/2018   CREATININE 1.77 (H) 03/27/2018   BUN 21 03/27/2018   CO2 30 03/27/2018   TSH 1.64 11/12/2017   PSA 2.28 08/11/2015   INR 1.1 (H) 08/01/2012   HGBA1C 5.9 12/29/2009    Dg Chest 2 View  Result Date: 03/25/2018 CLINICAL DATA:  Cough and fever for 3 days. History of hypertension. EXAM: CHEST - 2 VIEW COMPARISON:  12/25/2017 and older exams. FINDINGS: Cardiac silhouette is normal in size. No mediastinal or hilar masses no evidence of adenopathy. Lungs are hyperexpanded but clear. No  pleural effusion or pneumothorax. Skeletal structures are intact. IMPRESSION: No active cardiopulmonary disease. Electronically Signed   By: Lajean Manes M.D.   On: 03/25/2018 11:37    Assessment & Plan:   [name] was seen today for abdominal pain.  Diagnoses and all orders for this visit:  Confusion- His exam is reassuring.  His labs and plain films of the abdomen are unremarkable.  I think the confusion is related to influenza A and Tamiflu.  I do not expect any complications from this. -  POCT Urinalysis Dipstick (Automated)  Generalized abdominal tenderness without rebound tenderness- Examination, plain films, and labs are reassuring.  I think this is related to the constipation and I have encouraged him to be compliant with Motegrity. -     CBC with Differential/Platelet; Future -     Comprehensive metabolic panel; Future -     Lipase; Future -     DG ABD ACUTE 2+V W 1V CHEST; Future  Essential hypertension- His blood pressure is adequately well controlled.  CRI (chronic renal insufficiency), stage 3 (moderate) (HCC)- He will avoid nephrotoxic agents.   I have discontinued Josie Saunders. Greeno's oseltamivir and promethazine-dextromethorphan. I am also having him maintain his fexofenadine, buPROPion, vitamin B-12, meclizine, Tolvaptan (JYNARQUE PO), clonazePAM, Carbidopa-Levodopa ER, carbidopa-levodopa, Polyethylene Glycol 3350 (MIRALAX PO), omeprazole, Prucalopride Succinate, and oxymetazoline.  No orders of the defined types were placed in this encounter.    Follow-up: Return if symptoms worsen or fail to improve.  Scarlette Calico, MD

## 2018-03-27 NOTE — Telephone Encounter (Signed)
Pt has an appt today.  

## 2018-03-27 NOTE — Patient Instructions (Signed)
Abdominal Pain, Adult  Abdominal pain can be caused by many things. Often, abdominal pain is not serious and it gets better with no treatment or by being treated at home. However, sometimes abdominal pain is serious. Your health care provider will do a medical history and a physical exam to try to determine the cause of your abdominal pain.  Follow these instructions at home:   Take over-the-counter and prescription medicines only as told by your health care provider. Do not take a laxative unless told by your health care provider.   Drink enough fluid to keep your urine clear or pale yellow.   Watch your condition for any changes.   Keep all follow-up visits as told by your health care provider. This is important.  Contact a health care provider if:   Your abdominal pain changes or gets worse.   You are not hungry or you lose weight without trying.   You are constipated or have diarrhea for more than 2-3 days.   You have pain when you urinate or have a bowel movement.   Your abdominal pain wakes you up at night.   Your pain gets worse with meals, after eating, or with certain foods.   You are throwing up and cannot keep anything down.   You have a fever.  Get help right away if:   Your pain does not go away as soon as your health care provider told you to expect.   You cannot stop throwing up.   Your pain is only in areas of the abdomen, such as the right side or the left lower portion of the abdomen.   You have bloody or black stools, or stools that look like tar.   You have severe pain, cramping, or bloating in your abdomen.   You have signs of dehydration, such as:  ? Dark urine, very little urine, or no urine.  ? Cracked lips.  ? Dry mouth.  ? Sunken eyes.  ? Sleepiness.  ? Weakness.  This information is not intended to replace advice given to you by your health care provider. Make sure you discuss any questions you have with your health care provider.  Document Released: 10/18/2004 Document  Revised: 07/29/2015 Document Reviewed: 06/22/2015  Elsevier Interactive Patient Education  2019 [employer]

## 2018-03-28 ENCOUNTER — Encounter: Payer: Self-pay | Admitting: Neurology

## 2018-03-28 ENCOUNTER — Telehealth: Payer: Self-pay | Admitting: Gastroenterology

## 2018-03-28 ENCOUNTER — Ambulatory Visit: Payer: Medicare Other | Admitting: Neurology

## 2018-03-28 NOTE — Telephone Encounter (Signed)
Patients daughter states pt did colon cleans on Sunday but has not had a BM since. Pt daughter requests advice.

## 2018-03-28 NOTE — Telephone Encounter (Signed)
Called and spoke with patient's daughter-[name]- verified DPR-[name] reports the patient did the bowel cleanse and had "clean out" of bowel movements on Sunday but has not had a bowel movement since, has been taking the Miralax, Motegrity, eating high fiber diet, taking the fiber supplement, and increasing fluid intake- [name] is requesting to know if she should be worried that the patient has not had a bowel movement since the "clean out on Sunday"- was seen by PCP and complained of abd pain-xray completed-showed no "back up or blockages";  Please advise on next step in plan of care for the patient

## 2018-03-28 NOTE — Telephone Encounter (Signed)
Recommend increasing the MiraLAX to 3 times daily for now and perhaps hold off on the fiber supplement for the time being.  I reviewed the x-ray which does demonstrate mild to moderate amounts of stool in the colon.  Also, can add Dulcolax 5 mg twice daily, #30 RF3, and can back off of each of these if he is having loose, regular stools.  This will help with both motility and stool softening effect.  Depending on response, may want to trial Amitiza in the near future.  Continue adequate fluid hydration.  Thank you.

## 2018-03-30 ENCOUNTER — Encounter: Payer: Self-pay | Admitting: Internal Medicine

## 2018-03-31 NOTE — Telephone Encounter (Signed)
Pt's daughter Amy Ranson calling back best call back# [phone]

## 2018-03-31 NOTE — Telephone Encounter (Signed)
Patient's daughter called into the office-verified DPR-Amy-Amy informed of MD recommendations and Amy is agreeable with plan of care; Amy understands to increase Miralax to 3 times per day for result of loose, soft stools; Amy also advised to ensure patient's oral intake is increased; Amy reports she has Colace at home (OTC) and will administer to the patient; Amy advised to call back if questions/concerns arise and to give an update in 2-3 days on patient's symptoms; Amy verbalized understanding of information/instructions;

## 2018-03-31 NOTE — Telephone Encounter (Signed)
Left message for patient to call back to the office;  

## 2018-04-01 ENCOUNTER — Telehealth: Payer: Self-pay | Admitting: Gastroenterology

## 2018-04-01 ENCOUNTER — Other Ambulatory Visit: Payer: Self-pay

## 2018-04-01 MED ORDER — LUBIPROSTONE 24 MCG PO CAPS: 24.0000 ug | ORAL_CAPSULE | Freq: Two times a day (BID) | ORAL | 3 refills | Status: DC

## 2018-04-01 NOTE — Telephone Encounter (Signed)
Pt's daughter [name] called asking to speak with you regarding a medication that you have been working on. Pls call her.

## 2018-04-01 NOTE — Telephone Encounter (Signed)
Discussed with [name] the patients daughter that his insurance would not cover Motegrity because he has not failed on Amitiza. Per Dr. Wanita Chamberlain 59mcq BID has been sent to the patients pharmacy. Patients daughter verbalized understanding of new Rx orders.

## 2018-04-01 NOTE — Progress Notes (Signed)
Notified [name](patients daughter) of new Rx being sent to pharmacy. Verbalized understanding.

## 2018-04-07 ENCOUNTER — Ambulatory Visit: Payer: Medicare Other | Admitting: Gastroenterology

## 2018-04-08 NOTE — Progress Notes (Signed)
Henry Adams was seen today in the movement disorders clinic for neurologic consultation at the request of [name], MD.    Prior records made available to me were reviewed.  The patient first presented to [facility] in August, 2015 with complaints of a weak voice.    States that his voice was getting soft for a few years prior to that.   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   It helped for 4-5 months.   He recently followed up with his ENT physician at [facility] on 03/23/2015 complaining of continued weak voice, but they felt that his voice was fairly strong and did not see any pathology.  He was to follow-up with the voice lab.   The voice therapy didn't seem to help.   He did follow up with his primary care physician and was complaining about some mild tremor and was referred here to rule out Parkinsons disease.  Pt states that it is when he holds something proximally that he notes tremor bilaterally.  He also notes difficulty with handwriting; it is small and "squiggly."   02/01/17 update: Patient is seen today in follow-up, although I have not seen him in 2 years.  He is here to discuss memory change per referral but comes with a list of many other issues.  This patient is accompanied in the office by his daughter who supplements the history.    I have reviewed numerous records made available to me.  Pt reports he has been concerned about memory for about 10 months, although he complained about this concern to me about 2 years ago although there was no evidence of dementia then.  He was placed on Lexapro in October for concerns of anxiety and depression, which could potentially affect memory.  Pt thinks that was helpful but then later says that anxiety and depression are his biggest issue.  Living situation:  Pt lives with their spouse.  However, the patient's wife has Alzheimer dementia and the  patient is responsible for her care.   They live independently in retirement living.   The patient does do the finances in the home. Pt reports that he can do these but he has low initiative/motivation to do that. He isn't really sure that he has a memory problem but a motivation/energy problem.   He hasn't missed paying any bills.  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  He notes trouble with his cursive writing as well.   The patient does drive.  He has had no problem with that.  He programs his GPS himself and goes.   There have no been any motor vehicle accidents in the recent years.  The patient does not cook.  He states that they provide meals at retirement living .  He works out at [employer] at retirement living.  ADL's:  The patient is able to perform his own ADL's. The patient self medicates.  The patients bladder and bowel are under good control.   Behavior:  There have been no behavioral changes over the years.  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  Wifes personality has also changed with AD; becomes  agitated and paranoid.  Pt taking all the "brunt" of that per daughter.    03/12/17 update: Patient was seen today as a work in, at his request.  This patient is accompanied in the office by his daughter who supplements the history.Last visit, he was complaining about memory change and he was scheduled for neurocognitive testing.  This has not yet been completed, due to the fact that there is a lengthy wait list for this and the fact that he requested an early work in appointment.  Lab work was done for peripheral neuropathy as well as for myasthenia, and all of this was unremarkable.  We did start him on levodopa last visit and was to work up to carbidopa/levodopa 25/100 over a 4 week time span.   He is taking it 8am/noon/midnight.  Stays up to take at midnight so can spread evenly q 8 hours.   "I have made some progress and there are some days that I sleep all night."  Biggest c/o is restlessness and leg pain at night.  He has trouble in the AM for about 2-3 hours.  The trouble he describes are aches.  "so much of the trouble centers around neuropathy."  Daughter states that big contributor is they have had some bad times in taking care of wife and note that stress increases physical symptoms.  On lexapro 20 mg - been on that for about 2 months per patient.  He isn't sure that it is helping.  Daughter not sure.  Would like something else  07/01/17 update: Patient is seen today for follow-up for parkinsonism.  He was told to take carbidopa/levodopa 25/100 at 7 AM/11 AM/3 PM/7 PM and added carbidopa/levodopa 25/100 CR at bedtime to see if that would help restless leg syndrome.   He has had no falls but he did have his knee "give out" and had a near fall and went to PT and is better.  "I'm doing so much better."   The records that were made available to me were reviewed.  he saw Dr. Si Raider for neuropscyhometric testing on 05/23/17 and subsequently had a feedback session with him where results and recommendations were given to him.  These are detailed within the chart.  Pt did not have any evidence of dementia.  Pt dx with adjustment d/o with depressed and anxious mood.   Recommended respite care for him.  He was on lexapro and last visit we discussed remeron but told him to f/u with PCP.  He did see Dr. Sharlet Salina and he was told that he should f/u with [name], MD as the medication wouldn't help his "leg wandering sensation.  Ultimately, he was given a RX for trazdone by Dr. Sharlet Salina.  He states that it is helping.  Once per month, he may have an "irritable day" but otherwise doing well.    This has helped the RLS.  Still caregiving for wife who has AD.  He states that "she is living in a different  world."  Daughters are helping some but unsure if able to do on the longterm.  He is on lexapro and not sure if working as depression not as bad as it was but has it.    11/05/17 update: Patient is seen today in follow-up for parkinsonism.  He is on carbidopa/levodopa 25/100, 1 tablet at 7 AM/11 AM/3 PM/7 PM and carbidopa/levodopa 25/100 cr at bedtime.  He finds that by 9pm, he is restless and has trouble relaxing.   No  falls since last visit.  No hallucinations.  Records are reviewed since our last visit.  He was in the emergency room at the end of July with complaints of dizziness and abdominal pain.  Was given labetalol in the emergency room for high blood pressure.  Abdominal pain had resolved before getting to the ER.  Patient called the answering service the day after that ER visit and reported that he thought the symptoms were from starting Wellbutrin, but the emergency third room thought it was from abruptly stopping trazodone.  He was told to decrease the Wellbutrin from 300 mg to 150 mg.  Dr. Alain Marion placed him back on low-dose trazodone.  He isn't sleeping well.  His mood has been up and down.  They have moved to independent living at [place] and his wife is having a tough time dealing with that.  She takes it out on the patient.  He was also treated for constipation with Linzess.  Patient did not find that helpful.  He subsequently had an ultrasound which demonstrated a 2 cm gallstone with a thickened gallbladder wall.  He was referred to general surgery.  I have no notes about that and he states that he saw the surgeon and was told that they will just watch it.  He did see gastroenterology for constipation and he was told to continue Linzess, start MiraLAX daily as well as Colace.  Daughter states that it is better but not completely resolved  01/23/18 update: Patient was worked into the office today for parkinsonism.  He is accompanied by his daughter who supplements the history.  Daughter  emailed me a few days ago to let me know that the patient was having more trouble sleeping and felt that it was due to body movements.  She felt these movements had been going on for a few days to 1 week.  Last visit, I recognized that he had been having more restless leg and we slightly change his medication because of that.  He is now taking carbidopa/levodopa 25/100, 1 tablet at 7 AM/11 AM/3 PM and 1.5 tablets at 7 PM.  I also increased his bedtime carbidopa/levodopa to 50/200.  He thinks that "this is the medication I have trouble with."  When asked what he means, he states that he cannot sleep.  He did stop his trazodone because he thought it wasn't helping sleep (daughter thinks that he might have also thought that it contributed to dizziness).  He states that when he lays in the bed, he gets a pain in the legs and then if he gets up, it persists.  He has taken valium the last few nights and it has helped.  PCP just gave him RX for rozerem but he hasn't filled it yet.   Patient has been taking Antivert for dizziness.  His nephrologist had changed some other medications recently.  In addition, he had more confusion.  She also asked about getting the patient rescheduled for repeat neurocognitive testing.  He also had significant life change.  He was the primary caregiver for his wife, who has since been moved to a memory care unit.  He is not adjusting well to the change.  He does have a counselor but isn't sure it is helping.  "I've never been depressed and I'm not sure how to deal with this."  04/10/18 update: Patient is seen today as a work in appointment at his daughter's request for parkinsonism and memory change.  He had to cancel his last  appointment because of the fact that he got the flu.  His daughter accompanies him to the appointment and supplements the history.  He is on carbidopa/levodopa 25/100, 1 tablet at 7 AM/11 AM/3 PM and 1.5 tablets at 7 PM.  Last visit, I lowered his bedtime dose of  levodopa because he thought he had side effects with that and he is now on carbidopa/levodopa 25/100 CR at bedtime.  I added clonazepam 0.5 mg, half a tablet at night for restless leg.  He reports that he is still having the same sx and is having trouble sleeping.  Not sleeping much in the day but "I may nap a little."  I have received several emails from his daughter.  She is concerned about his cognitive change, but did not want to bring it out during the visit, so sent to take an email.  As above, he got the flu since our last visit.  During that time, he was very confused and had hallucinations.  That lasted less than a week with the hallucinations.  He thinks that memory is better but not quite normal.  Trouble with word finding.  No falls.   Did PT since our last visit.  Daughter also c/o constipation.  Saw GI and was given a bowel prep.  Saw Dr Bryan Lemma.  On miralax tid.  Now having small bowel movements 1-2 times per day.     PREVIOUS MEDICATIONS: given RX for aricept on 01/09/17 but pt reported vivid scary dreams with it after 3 days and he d/c it.  He started on namenda    ALLERGIES:   Allergies  Allergen Reactions   Iodine     rash   Oxycodone     Mental status changes & nausea   Pollen Extract Itching   Tamiflu [Oseltamivir Phosphate] Other (See Comments)    hallucinations   Tamsulosin     Other reaction(s): Other (See Comments) Unknown    Doxazosin Rash    agitation   Etodolac Rash    unknown   Tape Rash    Certain "Band-Aids"    CURRENT MEDICATIONS:  Outpatient Encounter Medications as of 04/10/2018  Medication Sig   buPROPion (WELLBUTRIN XL) 150 MG 24 hr tablet Take 1 tablet (150 mg total) by mouth daily.   carbidopa-levodopa (SINEMET IR) 25-100 MG tablet 1 tablet at 7am/11am/3pm and 1.5 at 7pm   Carbidopa-Levodopa ER (SINEMET CR) 25-100 MG tablet controlled release Take 1 tablet by mouth at bedtime.   clonazePAM (KLONOPIN) 0.5 MG tablet Take 0.5 tablets  (0.25 mg total) by mouth at bedtime.   fexofenadine (ALLEGRA) 180 MG tablet Take 180 mg by mouth daily.     meclizine (ANTIVERT) 12.5 MG tablet Take 1 tablet (12.5 mg total) by mouth 3 (three) times daily as needed for dizziness.   omeprazole (PRILOSEC) 40 MG capsule TAKE (1) CAPSULE DAILY.   oxymetazoline (AFRIN) 0.05 % nasal spray Place 1 spray into both nostrils 2 (two) times daily.   Polyethylene Glycol 3350 (MIRALAX PO) Take by mouth as needed.   Prucalopride Succinate (MOTEGRITY) 2 MG TABS Take 2 mg by mouth daily.   Tolvaptan (JYNARQUE PO) Take by mouth daily. 7am takes 45mg  and at 3 takes the 15mg    vitamin B-12 (CYANOCOBALAMIN) 1000 MCG tablet Take 1,000 mcg by mouth daily.   lubiprostone (AMITIZA) 24 MCG capsule Take 1 capsule (24 mcg total) by mouth 2 (two) times daily with a meal. (Patient not taking: Reported on 04/10/2018)   No facility-administered  encounter medications on file as of 04/10/2018.     PAST MEDICAL HISTORY:   Past Medical History:  Diagnosis Date   Allergy    perennial   Anxiety    Arthritis    Cholelithiasis    Chronic idiopathic constipation    CKD (chronic kidney disease)    Depression    Diverticulosis    Enlarged prostate    Gallstones    GERD (gastroesophageal reflux disease)    HLD (hyperlipidemia)    Hx of cardiovascular stress test    ETT-Myoview 6/14: probable low risk study, inf defect with normal inf wall motion-cannot r/o ischemia; prominent gut uptake adjacent to inf wall on rest images-poss defect is atten, EF 63%   Hypertension    Melanoma ([place])    Parkinsonism ([place])    Polycystic kidney disease     PAST SURGICAL HISTORY:   Past Surgical History:  Procedure Laterality Date   COLONOSCOPY  2011   diverticulosis   HERNIA REPAIR     umbilical   KNEE SURGERY Left    medialization laryngoplasty  10/15/2013   Dr Joya Gaskins, [facility]   TONSILLECTOMY AND ADENOIDECTOMY      SOCIAL HISTORY:   Social History    Socioeconomic History   Marital status: Married    Spouse name: Not on file   Number of children: 2   Years of education: Not on file   Highest education level: Not on file  Occupational History   Occupation: retired    Comment: business; 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  Social Needs   Emergency planning/management officer strain: Not on file   Food insecurity:    Worry: Not on file    Inability: Not on file   Transportation needs:    Medical: Not on file    Non-medical: Not on file  Tobacco Use   Smoking status: Never Smoker   Smokeless tobacco: Never Used  Substance and Sexual Activity   Alcohol use: Not Currently    Alcohol/week: 0.0 standard drinks   Drug use: No   Sexual activity: Not on file  Lifestyle   Physical activity:    Days per week: Not on file    Minutes per session: Not on file   Stress: Not on file  Relationships   Social connections:    Talks on phone: Not on file    Gets together: Not on file    Attends religious service: Not on file    Active member of club or organization: Not on file    Attends meetings of clubs or organizations: Not on file    Relationship status: Not on file   Intimate partner violence:    Fear of current or ex partner: Not on file    Emotionally abused: Not on file    Physically abused: Not on file    Forced sexual activity: Not on file  Other Topics Concern   Not on file  Social History Narrative   Not on file    FAMILY HISTORY:   Family Status  Relation Name Status   Mother  Deceased       heart disease, dementia   Father  Deceased       HTN, ulcers   Sister  Deceased       breast cancer   Brother Lavone Neri Deceased       cancer tongue/cheek, heart disease   Sister  Deceased       stroke, GERD   Brother  Alive  DJD   Child  Alive       2 daughters, breast CA hx   Sister Dorthory (Not Specified)   Brother  (Not Specified)   Daughter  (Not Specified)   Sister  (Not Specified)   Brother  (Not  Specified)   Neg Hx  (Not Specified)    ROS:   Review of Systems  Constitutional: Positive for malaise/fatigue.  HENT: Negative.   Eyes: Negative.   Respiratory: Negative.   Cardiovascular: Negative.   Gastrointestinal: Positive for constipation.  Genitourinary: Negative.   Musculoskeletal: Negative.   Skin: Negative.     PHYSICAL EXAMINATION:    VITALS:   Vitals:   04/10/18 0845  BP: 120/78  Pulse: 80  Temp: 97.8 F (36.6 C)  TempSrc: Oral  SpO2: 94%  Weight: 158 lb (71.7 kg)  Height: 5\' 11"  (1.803 m)     GEN:  The patient appears stated age and is in NAD. HEENT:  Normocephalic, atraumatic.  The mucous membranes are moist. The superficial temporal arteries are without ropiness or tenderness.  There are no tongue fasciculations. CV:  RRR Lungs:  CTAB Neck/HEME:  There are no carotid bruits bilaterally.  Neurological examination:  Orientation: Patient is alert and oriented x3.  He looks to his daughter for finer aspects of the history. Montreal Cognitive Assessment  02/04/2017  Visuospatial/ Executive (0/5) 3  Naming (0/3) 1  Attention: Read list of digits (0/2) 2  Attention: Read list of letters (0/1) 1  Attention: Serial 7 subtraction starting at 100 (0/3) 1  Language: Repeat phrase (0/2) 2  Language : Fluency (0/1) 0  Abstraction (0/2) 2  Delayed Recall (0/5) 2  Orientation (0/6) 6  Total 20  Adjusted Score (based on education) 20    Cranial nerves: There is good facial symmetry.  There is facial hypomimia.  The speech is fluent and clear. Soft palate rises symmetrically and there is no tongue deviation. Hearing is decreased to conversational tone. Sensation: Sensation is intact to light touch throughout Motor: Strength is 5/5 in the bilateral upper and lower extremities.   Shoulder shrug is equal and symmetric.  There is no pronator drift.  Movement examination: Tone: There is normal tone in the bilateral upper extremities. Abnormal movements:  None Coordination:  There is no decremation, with any form of RAMS, including alternating supination and pronation of the forearm, hand opening and closing, finger taps, heel taps and toe taps. Gait and Station: The patient has no difficulty arising out of a deep-seated chair without the use of the hands. The patient's stride length is good today with just slight decrease in arm swing.   Lab Results  Component Value Date   TSH 1.64 11/12/2017   Lab Results  Component Value Date   VITAMINB12 361 11/14/2016      Chemistry      Component Value Date/Time   NA 139 03/27/2018 1421   K 3.6 03/27/2018 1421   CL 102 03/27/2018 1421   CO2 30 03/27/2018 1421   BUN 21 03/27/2018 1421   CREATININE 1.77 (H) 03/27/2018 1421      Component Value Date/Time   CALCIUM 8.3 (L) 03/27/2018 1421   ALKPHOS 72 03/27/2018 1421   AST 23 03/27/2018 1421   ALT 9 03/27/2018 1421   BILITOT 0.5 03/27/2018 1421          ASSESSMENT/PLAN:  1.  Memory loss  -He had neurocognitive testing in 05/2017 and no evidence of dementia.  I think he does  have pseudodementia from underlying depression.  It has gotten worse with wifes transition to memory care living and he is now living with daughter.    Daughter thinks getting worse and I do as well (although he does have some hearing trouble).  I wonder if he has transition to a mild dementia.  We will get him scheduled for neurocognitive testing, once the coronavirus pandemic has passed and once Dr. Melvyn Novas has started.   2.  Peripheral neuropathy  -The patient has clinical examination evidence of a diffuse peripheral neuropathy, which certainly can affect gait and balance.  We discussed safety associated with peripheral neuropathy.  We discussed balance therapy and the importance of ambulatory assistive device for balance assistance.  -on b12 supplement   3.  Parkinsonism   -He will continue carbidopa/levodopa 25/100, 1 tablet at 7 AM/11 AM/3 PM/ and 7 PM to 1.5  tablets.   -Continue carbidopa/levodopa 25/100 CR at bedtime.  He did not think that the 50/200 helped and thought he had more side effects.  4.  Depression  -may need to raise wellbutrin xl back to 300 mg daily.  Didn't do that myself because changing other meds today.  Pt is not SI.  Encouraged him to verbalize feelings to daughter/counselor.   5.RLS  -d/c valium  -increase klonopin - 0.5 mg - 1 tablet nightly.  Will let me know if he has any side effects.  Discussed extensively risks, benefits, and side effects.  Will let us know if this throws off his balance or causes more confusion.  -hold on starting rozerem for now  6.  Constipation  -following with GI  -increase hydration  7.  Follow-up in the next 5 months, sooner should new neurologic issues arise.  Much greater than 50% of this visit was spent in counseling and coordinating care.  Total face to face time:  30 min

## 2018-04-09 ENCOUNTER — Telehealth: Payer: Self-pay | Admitting: Gastroenterology

## 2018-04-09 ENCOUNTER — Ambulatory Visit: Payer: Medicare Other | Admitting: Gastroenterology

## 2018-04-09 DIAGNOSIS — E785 Hyperlipidemia, unspecified: Secondary | ICD-10-CM | POA: Diagnosis not present

## 2018-04-09 DIAGNOSIS — Q613 Polycystic kidney, unspecified: Secondary | ICD-10-CM | POA: Diagnosis not present

## 2018-04-09 DIAGNOSIS — N183 Chronic kidney disease, stage 3 (moderate): Secondary | ICD-10-CM | POA: Diagnosis not present

## 2018-04-09 DIAGNOSIS — I129 Hypertensive chronic kidney disease with stage 1 through stage 4 chronic kidney disease, or unspecified chronic kidney disease: Secondary | ICD-10-CM | POA: Diagnosis not present

## 2018-04-10 ENCOUNTER — Other Ambulatory Visit: Payer: Self-pay

## 2018-04-10 ENCOUNTER — Ambulatory Visit: Payer: Medicare Other | Admitting: Neurology

## 2018-04-10 ENCOUNTER — Encounter: Payer: Self-pay | Admitting: Neurology

## 2018-04-10 VITALS — BP 120/78 | HR 80 | Temp 97.8°F | Ht 71.0 in | Wt 158.0 lb

## 2018-04-10 DIAGNOSIS — K5901 Slow transit constipation: Secondary | ICD-10-CM

## 2018-04-10 DIAGNOSIS — G20 Parkinson's disease: Secondary | ICD-10-CM | POA: Diagnosis not present

## 2018-04-10 DIAGNOSIS — G2581 Restless legs syndrome: Secondary | ICD-10-CM

## 2018-04-10 MED ORDER — CLONAZEPAM 0.5 MG PO TABS: 0.5000 mg | ORAL_TABLET | Freq: Every day | ORAL | 1 refills | Status: DC

## 2018-04-10 NOTE — Patient Instructions (Signed)
1.  Increase clonazepam to 0.5 mg - 1 tablet at night 2.  We will put you on the list for Dr. Melvyn Novas, the new neuropsychologist who will be doing the memory testing

## 2018-04-14 ENCOUNTER — Encounter: Payer: Self-pay | Admitting: Physical Therapy

## 2018-04-14 NOTE — Therapy (Signed)
[facility] [address] [place], [place], [address] Phone: [phone]   Fax:  [phone]  Patient Details  Name: Henry Adams MRN: [id number] Date of Birth: Sep 21, 1940 Referring Provider:  No ref. provider found  Encounter Date: 04/14/2018  PHYSICAL THERAPY DISCHARGE SUMMARY  Visits from Start of Care: 9  Current functional level related to goals / functional outcomes: PT Long Term Goals - 01/31/18 1327      PT LONG TERM GOAL #1   Title  Pt will verbalize understanding of fall prevention in home environment, including tips to reduce freezing episodes with gait.  TARGET 03/14/2018    Time  6    Period  Weeks    Status  New    Target Date  03/14/18      PT LONG TERM GOAL #2   Title  Pt will improve Dynamic Gait Index score to at least 19/24 for decreased fall risk.    Time  6    Period  Weeks    Status  New    Target Date  03/14/18      PT LONG TERM GOAL #3   Title  Pt will verbalize plans for continued community fitness upon d/c from PT.    Time  6    Period  Weeks    Status  New    Target Date  03/14/18      PT LONG TERM GOAL #4   Title  Pt will verbalize understanding of local Parkinson's disease resources.    Time  6    Period  Weeks    Status  New    Target Date  03/14/18      PT Short Term Goals - 03/04/18 0941      PT SHORT TERM GOAL #1   Title  Pt will be independent with HEP for improved balance and mobility.  TARGET 02/28/2018    Baseline  Performs HEP, but needs cues    Time  4    Period  Weeks    Status  Partially Met    Target Date  02/28/18      PT SHORT TERM GOAL #2   Title  Pt will perform sit<>stand 8 of 10 reps, from <18" surfaces with minimal to no UE support, for improved low surface transfers.    Baseline  performs 10 reps from 18" surface, 5 reps from 16" surfaces (pt requests to stop due to pain in R knee)    Time  4    Period  Weeks    Status  Partially Met    Target Date   02/28/18      PT SHORT TERM GOAL #3   Title  Pt will improve TUG and TUG cognitive score to less than 10% difference, for improved dual tasking with gait.    Time  4    Period  Weeks    Status  Not Met    Target Date  02/28/18      Pt has partially met 2 of 3 STGs.  LTGs not able to be fully assessed or addressed, as pt cancelled remaining visits after 03/06/2018 visit.   Remaining deficits: Posture, balance   Education / Equipment: Educated in [employer]  Plan: Patient agrees to discharge.  Patient goals were partially met. Patient is being discharged due to not returning since the last visit.  ?????Pt requested cancelling remaining visits.       Ashia Dehner W. 04/14/2018, 11:51 AM [address]  [facility] [address] [place], [place], [address] Phone: [phone]   Fax:  [phone]

## 2018-04-16 ENCOUNTER — Telehealth: Payer: Self-pay | Admitting: Gastroenterology

## 2018-04-16 NOTE — Telephone Encounter (Signed)
I spoke with the patient's daughter this morning.  Will be completing course of Motegrity today, and due to insurance issues, changing to Amitiza 24 mcg twice daily tomorrow.  She just wanted to ensure that they are dosing correctly.  Continues to take MiraLAX BID, and Colace BID.  Having 1 formed, soft stool daily.  Tolerating p.o. intake.  Drinking plenty of fluids.  Discussed Amitiza again today, to include medication ADRs.  No renal adjustment necessary.  -Resume MiraLAX and Colace and titrate these medications to affect -Start Amitiza as prescribed tomorrow - Ensure drinking at least 64 ounces of water daily - RTC as needed -All questions answered

## 2018-04-16 NOTE — Telephone Encounter (Signed)
Pt daughter called in wanting a call back from the nurse to explain the dosage for the medication and how to take it

## 2018-05-06 ENCOUNTER — Encounter: Payer: Self-pay | Admitting: Internal Medicine

## 2018-05-06 ENCOUNTER — Other Ambulatory Visit: Payer: Self-pay | Admitting: Internal Medicine

## 2018-06-08 ENCOUNTER — Encounter: Payer: Self-pay | Admitting: Internal Medicine

## 2018-06-11 NOTE — Telephone Encounter (Signed)
Copied from [place]. Topic: Quick Communication - See Telephone Encounter >> Jun 11, 2018  4:13 PM Vernona Rieger wrote: CRM for notification. See Telephone encounter for: 06/11/18. Pt returning Steffanie's call. Please advise.

## 2018-06-11 NOTE — Telephone Encounter (Signed)
Tried to call pt but no answer. Called [name] (pt dtr at mobile number listed for pt). Informed that I tried to call home number and no one answered. I requested that [name] have pt call us and we would see what we needed to do. [name] agreed.

## 2018-06-12 DIAGNOSIS — N401 Enlarged prostate with lower urinary tract symptoms: Secondary | ICD-10-CM | POA: Diagnosis not present

## 2018-06-12 DIAGNOSIS — R350 Frequency of micturition: Secondary | ICD-10-CM | POA: Diagnosis not present

## 2018-06-12 DIAGNOSIS — R3915 Urgency of urination: Secondary | ICD-10-CM | POA: Diagnosis not present

## 2018-06-16 ENCOUNTER — Emergency Department (HOSPITAL_COMMUNITY): Payer: Medicare Other

## 2018-06-16 ENCOUNTER — Encounter (HOSPITAL_COMMUNITY): Payer: Self-pay

## 2018-06-16 ENCOUNTER — Ambulatory Visit (INDEPENDENT_AMBULATORY_CARE_PROVIDER_SITE_OTHER): Payer: Medicare Other

## 2018-06-16 ENCOUNTER — Ambulatory Visit (HOSPITAL_COMMUNITY): Payer: Medicare Other

## 2018-06-16 ENCOUNTER — Encounter (HOSPITAL_COMMUNITY): Payer: Self-pay | Admitting: Emergency Medicine

## 2018-06-16 ENCOUNTER — Observation Stay (HOSPITAL_COMMUNITY)
Admission: EM | Admit: 2018-06-16 | Discharge: 2018-06-19 | Disposition: A | Discharge status: Home / Self Care | Payer: Medicare Other | Attending: Family Medicine | Admitting: Family Medicine

## 2018-06-16 ENCOUNTER — Other Ambulatory Visit: Payer: Self-pay

## 2018-06-16 ENCOUNTER — Ambulatory Visit (INDEPENDENT_AMBULATORY_CARE_PROVIDER_SITE_OTHER)
Admission: EM | Admit: 2018-06-16 | Discharge: 2018-06-16 | Disposition: A | Discharge status: Home / Self Care | Payer: Medicare Other | Source: Home / Self Care | Attending: Family Medicine | Admitting: Family Medicine

## 2018-06-16 DIAGNOSIS — I10 Essential (primary) hypertension: Secondary | ICD-10-CM | POA: Diagnosis present

## 2018-06-16 DIAGNOSIS — D329 Benign neoplasm of meninges, unspecified: Secondary | ICD-10-CM | POA: Insufficient documentation

## 2018-06-16 DIAGNOSIS — R338 Other retention of urine: Secondary | ICD-10-CM | POA: Diagnosis not present

## 2018-06-16 DIAGNOSIS — G4700 Insomnia, unspecified: Secondary | ICD-10-CM | POA: Insufficient documentation

## 2018-06-16 DIAGNOSIS — E785 Hyperlipidemia, unspecified: Secondary | ICD-10-CM | POA: Insufficient documentation

## 2018-06-16 DIAGNOSIS — N183 Chronic kidney disease, stage 3 unspecified: Secondary | ICD-10-CM | POA: Diagnosis present

## 2018-06-16 DIAGNOSIS — J449 Chronic obstructive pulmonary disease, unspecified: Secondary | ICD-10-CM | POA: Insufficient documentation

## 2018-06-16 DIAGNOSIS — R339 Retention of urine, unspecified: Secondary | ICD-10-CM

## 2018-06-16 DIAGNOSIS — I444 Left anterior fascicular block: Secondary | ICD-10-CM | POA: Insufficient documentation

## 2018-06-16 DIAGNOSIS — K5909 Other constipation: Secondary | ICD-10-CM | POA: Diagnosis not present

## 2018-06-16 DIAGNOSIS — D1802 Hemangioma of intracranial structures: Secondary | ICD-10-CM | POA: Diagnosis not present

## 2018-06-16 DIAGNOSIS — M1990 Unspecified osteoarthritis, unspecified site: Secondary | ICD-10-CM | POA: Diagnosis not present

## 2018-06-16 DIAGNOSIS — F418 Other specified anxiety disorders: Secondary | ICD-10-CM | POA: Insufficient documentation

## 2018-06-16 DIAGNOSIS — G9341 Metabolic encephalopathy: Secondary | ICD-10-CM | POA: Diagnosis not present

## 2018-06-16 DIAGNOSIS — G928 Other toxic encephalopathy: Secondary | ICD-10-CM | POA: Insufficient documentation

## 2018-06-16 DIAGNOSIS — F419 Anxiety disorder, unspecified: Secondary | ICD-10-CM | POA: Diagnosis not present

## 2018-06-16 DIAGNOSIS — G92 Toxic encephalopathy: Secondary | ICD-10-CM | POA: Diagnosis present

## 2018-06-16 DIAGNOSIS — N281 Cyst of kidney, acquired: Secondary | ICD-10-CM | POA: Diagnosis not present

## 2018-06-16 DIAGNOSIS — I129 Hypertensive chronic kidney disease with stage 1 through stage 4 chronic kidney disease, or unspecified chronic kidney disease: Secondary | ICD-10-CM | POA: Diagnosis not present

## 2018-06-16 DIAGNOSIS — R351 Nocturia: Secondary | ICD-10-CM | POA: Diagnosis not present

## 2018-06-16 DIAGNOSIS — Z79899 Other long term (current) drug therapy: Secondary | ICD-10-CM | POA: Insufficient documentation

## 2018-06-16 DIAGNOSIS — G20 Parkinson's disease: Secondary | ICD-10-CM | POA: Insufficient documentation

## 2018-06-16 DIAGNOSIS — R509 Fever, unspecified: Secondary | ICD-10-CM

## 2018-06-16 DIAGNOSIS — R109 Unspecified abdominal pain: Secondary | ICD-10-CM

## 2018-06-16 DIAGNOSIS — Z8249 Family history of ischemic heart disease and other diseases of the circulatory system: Secondary | ICD-10-CM | POA: Insufficient documentation

## 2018-06-16 DIAGNOSIS — I700 Atherosclerosis of aorta: Secondary | ICD-10-CM | POA: Insufficient documentation

## 2018-06-16 DIAGNOSIS — N401 Enlarged prostate with lower urinary tract symptoms: Secondary | ICD-10-CM | POA: Insufficient documentation

## 2018-06-16 DIAGNOSIS — R51 Headache: Secondary | ICD-10-CM | POA: Diagnosis not present

## 2018-06-16 DIAGNOSIS — K219 Gastro-esophageal reflux disease without esophagitis: Secondary | ICD-10-CM | POA: Insufficient documentation

## 2018-06-16 DIAGNOSIS — Q613 Polycystic kidney, unspecified: Secondary | ICD-10-CM | POA: Diagnosis not present

## 2018-06-16 DIAGNOSIS — Z85820 Personal history of malignant melanoma of skin: Secondary | ICD-10-CM | POA: Insufficient documentation

## 2018-06-16 DIAGNOSIS — N3000 Acute cystitis without hematuria: Secondary | ICD-10-CM | POA: Diagnosis not present

## 2018-06-16 DIAGNOSIS — Z1159 Encounter for screening for other viral diseases: Secondary | ICD-10-CM | POA: Diagnosis not present

## 2018-06-16 DIAGNOSIS — N390 Urinary tract infection, site not specified: Secondary | ICD-10-CM | POA: Diagnosis present

## 2018-06-16 DIAGNOSIS — I4430 Unspecified atrioventricular block: Secondary | ICD-10-CM | POA: Diagnosis not present

## 2018-06-16 LAB — URINALYSIS, ROUTINE W REFLEX MICROSCOPIC
Bilirubin Urine: NEGATIVE
Glucose, UA: NEGATIVE mg/dL
Hgb urine dipstick: NEGATIVE
Ketones, ur: 5 mg/dL — AB
Nitrite: NEGATIVE
Protein, ur: 100 mg/dL — AB
Specific Gravity, Urine: 1.024 (ref 1.005–1.030)
WBC, UA: >50 WBC/hpf — ABNORMAL HIGH (ref 0–5)
pH: 5 (ref 5.0–8.0)

## 2018-06-16 LAB — CBG MONITORING, ED: Glucose-Capillary: 89 mg/dL (ref 70–99)

## 2018-06-16 LAB — CBC WITH DIFFERENTIAL/PLATELET
Abs Immature Granulocytes: 0.06 10*3/uL (ref 0.00–0.07)
Basophils Absolute: 0 10*3/uL (ref 0.0–0.1)
Basophils Relative: 0 %
Eosinophils Absolute: 0 10*3/uL (ref 0.0–0.5)
Eosinophils Relative: 0 %
HCT: 40.4 % (ref 39.0–52.0)
Hemoglobin: 13.2 g/dL (ref 13.0–17.0)
Immature Granulocytes: 0 %
Lymphocytes Relative: 9 %
Lymphs Abs: 1.3 10*3/uL (ref 0.7–4.0)
MCH: 32.2 pg (ref 26.0–34.0)
MCHC: 32.7 g/dL (ref 30.0–36.0)
MCV: 98.5 fL (ref 80.0–100.0)
Monocytes Absolute: 1.7 10*3/uL — ABNORMAL HIGH (ref 0.1–1.0)
Monocytes Relative: 11 %
Neutro Abs: 12.2 10*3/uL — ABNORMAL HIGH (ref 1.7–7.7)
Neutrophils Relative %: 80 %
Platelets: 176 10*3/uL (ref 150–400)
RBC: 4.1 MIL/uL — ABNORMAL LOW (ref 4.22–5.81)
RDW: 13.2 % (ref 11.5–15.5)
WBC: 15.3 10*3/uL — ABNORMAL HIGH (ref 4.0–10.5)
nRBC: 0 % (ref 0.0–0.2)

## 2018-06-16 LAB — COMPREHENSIVE METABOLIC PANEL
ALT: 11 U/L (ref 0–44)
ALT: 6 U/L (ref 0–44)
AST: 16 U/L (ref 15–41)
AST: 17 U/L (ref 15–41)
Albumin: 3.5 g/dL (ref 3.5–5.0)
Albumin: 3.7 g/dL (ref 3.5–5.0)
Alkaline Phosphatase: 88 U/L (ref 38–126)
Alkaline Phosphatase: 86 U/L (ref 38–126)
Anion gap: 8 (ref 5–15)
Anion gap: 9 (ref 5–15)
BUN: 31 mg/dL — ABNORMAL HIGH (ref 8–23)
BUN: 34 mg/dL — ABNORMAL HIGH (ref 8–23)
CO2: 26 mmol/L (ref 22–32)
CO2: 22 mmol/L (ref 22–32)
Calcium: 8.6 mg/dL — ABNORMAL LOW (ref 8.9–10.3)
Calcium: 8.4 mg/dL — ABNORMAL LOW (ref 8.9–10.3)
Chloride: 103 mmol/L (ref 98–111)
Chloride: 105 mmol/L (ref 98–111)
Creatinine, Ser: 1.85 mg/dL — ABNORMAL HIGH (ref 0.61–1.24)
Creatinine, Ser: 1.78 mg/dL — ABNORMAL HIGH (ref 0.61–1.24)
GFR calc Af Amer: 39 mL/min — ABNORMAL LOW (ref >60)
GFR calc Af Amer: 41 mL/min — ABNORMAL LOW (ref >60)
GFR calc non Af Amer: 34 mL/min — ABNORMAL LOW (ref >60)
GFR calc non Af Amer: 35 mL/min — ABNORMAL LOW (ref >60)
Glucose, Bld: 100 mg/dL — ABNORMAL HIGH (ref 70–99)
Glucose, Bld: 104 mg/dL — ABNORMAL HIGH (ref 70–99)
Potassium: 4.1 mmol/L (ref 3.5–5.1)
Potassium: 3.5 mmol/L (ref 3.5–5.1)
Sodium: 137 mmol/L (ref 135–145)
Sodium: 136 mmol/L (ref 135–145)
Total Bilirubin: 1.8 mg/dL — ABNORMAL HIGH (ref 0.3–1.2)
Total Bilirubin: 1.2 mg/dL (ref 0.3–1.2)
Total Protein: 7.3 g/dL (ref 6.5–8.1)
Total Protein: 7.5 g/dL (ref 6.5–8.1)

## 2018-06-16 LAB — POCT URINALYSIS DIP (DEVICE)
Glucose, UA: NEGATIVE mg/dL
Hgb urine dipstick: NEGATIVE
Ketones, ur: NEGATIVE mg/dL
Nitrite: NEGATIVE
Protein, ur: 30 mg/dL — AB
Specific Gravity, Urine: 1.025 (ref 1.005–1.030)
Urobilinogen, UA: 1 mg/dL (ref 0.0–1.0)
pH: 5.5 (ref 5.0–8.0)

## 2018-06-16 LAB — CBC
HCT: 41.8 % (ref 39.0–52.0)
Hemoglobin: 13.7 g/dL (ref 13.0–17.0)
MCH: 31.6 pg (ref 26.0–34.0)
MCHC: 32.8 g/dL (ref 30.0–36.0)
MCV: 96.5 fL (ref 80.0–100.0)
Platelets: 179 10*3/uL (ref 150–400)
RBC: 4.33 MIL/uL (ref 4.22–5.81)
RDW: 13 % (ref 11.5–15.5)
WBC: 15.5 10*3/uL — ABNORMAL HIGH (ref 4.0–10.5)
nRBC: 0 % (ref 0.0–0.2)

## 2018-06-16 LAB — SARS CORONAVIRUS 2 BY RT PCR (HOSPITAL ORDER, PERFORMED IN ~~LOC~~ HOSPITAL LAB): SARS Coronavirus 2: NEGATIVE

## 2018-06-16 LAB — LACTIC ACID, PLASMA: Lactic Acid, Venous: 0.9 mmol/L (ref 0.5–1.9)

## 2018-06-16 LAB — RAPID URINE DRUG SCREEN, HOSP PERFORMED
Amphetamines: NOT DETECTED
Barbiturates: NOT DETECTED
Benzodiazepines: NOT DETECTED
Cocaine: NOT DETECTED
Opiates: NOT DETECTED
Tetrahydrocannabinol: NOT DETECTED

## 2018-06-16 LAB — ETHANOL: Alcohol, Ethyl (B): <10 mg/dL (ref <10)

## 2018-06-16 LAB — AMMONIA: Ammonia: 23 umol/L (ref 9–35)

## 2018-06-16 MED ORDER — POLYETHYLENE GLYCOL 3350 17 G PO PACK: 17.0000 g | PACK | Freq: Every day | ORAL | Status: DC | PRN

## 2018-06-16 MED ORDER — HYDRALAZINE HCL 20 MG/ML IJ SOLN: 5.0000 mg | INTRAMUSCULAR | Status: DC | PRN

## 2018-06-16 MED ORDER — ONDANSETRON HCL 4 MG/2ML IJ SOLN: 4.0000 mg | Freq: Four times a day (QID) | INTRAMUSCULAR | Status: DC | PRN

## 2018-06-16 MED ORDER — ACETAMINOPHEN 325 MG PO TABS
650.0000 mg | ORAL_TABLET | Freq: Four times a day (QID) | ORAL | Status: DC | PRN
  Administered 2018-06-17: 650 mg via ORAL
  Filled 2018-06-16: qty 2

## 2018-06-16 MED ORDER — LUBIPROSTONE 24 MCG PO CAPS
24.0000 ug | ORAL_CAPSULE | Freq: Two times a day (BID) | ORAL | Status: DC
  Administered 2018-06-17 – 2018-06-19 (×5): 24 ug via ORAL
  Filled 2018-06-16 (×6): qty 1

## 2018-06-16 MED ORDER — VITAMIN B-12 1000 MCG PO TABS
1000.0000 ug | ORAL_TABLET | Freq: Every day | ORAL | Status: DC
  Administered 2018-06-17 – 2018-06-19 (×3): 1000 ug via ORAL
  Filled 2018-06-16 (×3): qty 1

## 2018-06-16 MED ORDER — ONDANSETRON HCL 4 MG PO TABS: 4.0000 mg | ORAL_TABLET | Freq: Four times a day (QID) | ORAL | Status: DC | PRN

## 2018-06-16 MED ORDER — METRONIDAZOLE IN NACL 5-0.79 MG/ML-% IV SOLN
500.0000 mg | Freq: Once | INTRAVENOUS | Status: AC
  Administered 2018-06-16: 22:00:00 500 mg via INTRAVENOUS
  Filled 2018-06-16: qty 100

## 2018-06-16 MED ORDER — DOCUSATE SODIUM 100 MG PO CAPS
100.0000 mg | ORAL_CAPSULE | Freq: Every day | ORAL | Status: DC
  Administered 2018-06-17 – 2018-06-19 (×3): 100 mg via ORAL
  Filled 2018-06-16 (×3): qty 1

## 2018-06-16 MED ORDER — SODIUM CHLORIDE 0.9 % IV BOLUS
1000.0000 mL | Freq: Once | INTRAVENOUS | Status: AC
  Administered 2018-06-16: 22:00:00 1000 mL via INTRAVENOUS

## 2018-06-16 MED ORDER — PANTOPRAZOLE SODIUM 40 MG PO TBEC
40.0000 mg | DELAYED_RELEASE_TABLET | Freq: Every day | ORAL | Status: DC
  Administered 2018-06-17 – 2018-06-19 (×3): 40 mg via ORAL
  Filled 2018-06-16 (×3): qty 1

## 2018-06-16 MED ORDER — HEPARIN SODIUM (PORCINE) 5000 UNIT/ML IJ SOLN
5000.0000 [IU] | Freq: Three times a day (TID) | INTRAMUSCULAR | Status: DC
  Administered 2018-06-17 – 2018-06-19 (×6): 5000 [IU] via SUBCUTANEOUS
  Filled 2018-06-16 (×7): qty 1

## 2018-06-16 MED ORDER — SODIUM CHLORIDE 0.9 % IV SOLN
2.0000 g | Freq: Once | INTRAVENOUS | Status: AC
  Administered 2018-06-16: 22:00:00 2 g via INTRAVENOUS
  Filled 2018-06-16: qty 2

## 2018-06-16 MED ORDER — CLONAZEPAM 0.5 MG PO TABS
0.5000 mg | ORAL_TABLET | Freq: Every day | ORAL | Status: DC
  Administered 2018-06-17 – 2018-06-18 (×3): 0.5 mg via ORAL
  Filled 2018-06-16 (×3): qty 1

## 2018-06-16 MED ORDER — LORATADINE 10 MG PO TABS
10.0000 mg | ORAL_TABLET | Freq: Every day | ORAL | Status: DC
  Administered 2018-06-17 – 2018-06-19 (×3): 10 mg via ORAL
  Filled 2018-06-16 (×3): qty 1

## 2018-06-16 MED ORDER — BUPROPION HCL ER (XL) 150 MG PO TB24
150.0000 mg | ORAL_TABLET | Freq: Every day | ORAL | Status: DC
  Administered 2018-06-17 – 2018-06-19 (×3): 150 mg via ORAL
  Filled 2018-06-16 (×3): qty 1

## 2018-06-16 MED ORDER — CARBIDOPA-LEVODOPA 25-100 MG PO TABS
1.0000 | ORAL_TABLET | ORAL | Status: DC
  Administered 2018-06-17 – 2018-06-19 (×8): 1 via ORAL
  Filled 2018-06-16 (×8): qty 1

## 2018-06-16 MED ORDER — SODIUM CHLORIDE 0.9 % IV SOLN
1.0000 g | INTRAVENOUS | Status: DC
  Administered 2018-06-17: 08:00:00 1 g via INTRAVENOUS
  Filled 2018-06-16: qty 1

## 2018-06-16 MED ORDER — VANCOMYCIN HCL IN DEXTROSE 1-5 GM/200ML-% IV SOLN
1000.0000 mg | Freq: Once | INTRAVENOUS | Status: AC
  Administered 2018-06-16: 1000 mg via INTRAVENOUS
  Filled 2018-06-16: qty 200

## 2018-06-16 MED ORDER — ACETAMINOPHEN 650 MG RE SUPP: 650.0000 mg | Freq: Four times a day (QID) | RECTAL | Status: DC | PRN

## 2018-06-16 NOTE — Progress Notes (Signed)
A consult was received from an ED physician for cefepime and vancomycin per pharmacy dosing.  The patient's profile has been reviewed for ht/wt/allergies/indication/available labs.   A one time order has been placed for Cefepime 2 Gm and  Vancomcyin 1 Gm.  Further antibiotics/pharmacy consults should be ordered by admitting physician if indicated.                       Thank you, Dorrene German 06/16/2018  9:32 PM

## 2018-06-16 NOTE — Discharge Instructions (Signed)
Urine did not show signs of infection, we will send this for culture to confirm. Chest x-ray was normal, no signs of pneumonia. I will call with results of blood work if abnormal. Please continue to monitor temperature, follow-up with PCP on Thursday as planned  If developing any increased confusion, weakness, changes from his baseline please follow-up in the emergency room.

## 2018-06-16 NOTE — ED Triage Notes (Signed)
Pt was brought in by daughter for intermittent fever for couple days, increased confusion yesterday. Was seen at [facility] this morning.  Daughter Amy can be reached at [phone]

## 2018-06-16 NOTE — ED Notes (Signed)
Pt accompanied by Philis Pique- daughter for ED visit.Marland Kitchenrequests to be present w/pt during ED visit, states "he doesn't always know what to say"- no further explanation. Explained to daughter d/t COVID restrictions no visitors allowed; assured daughter father will be taken care of and calls/updates will be made to her re: treatment/care. Encouraged daughter to provide direct contact information for EDP/RN to contact w/updates; welcoming incoming calls to inquire of father's condition. Pt was teary but acknowledged and cooperated. Amy M- [email or web address]@ [phone] requesting updates when possible. [employer]

## 2018-06-16 NOTE — H&P (Addendum)
History and Physical    Henry Adams:[id number] DOB: 01-10-1941 DOA: 06/16/2018  Referring MD/NP/PA:   PCP: [name], MD   Patient coming from:  The patient is coming from home.  At baseline, pt is partially dependent for most of ADL.        Chief Complaint: fever and AMS  HPI: Henry Adams is a 78 y.o. male with medical history significant of parkinson's, memory issues, polycystic kidneys, BPH, GERD, depression with anxiety, CKD-3, who presents with fever and altered mental status.  Per ED physician, patient's daughter reported that the patient has been confused than his baseline in the past several days.  He has fever of 101.3 at home last night. Pt was seen in urgent care later in the day.  He had a urine dipstick and a chest x-ray which were negative at that time.  Patient's daughter reported to EDP that a couple of days ago they were at the urologist office and they did a bladder scan that showed up a lot of urine in there however they were unable to cath him. She is concerned that pt may have urinary retention and infection. Pt was found to have postvoid residual volume of 500 cc in ED. Patient denies dysuria or burning on urination.  Patient does not have chest pain, no active respiratory distress or cough.  No active nausea, vomiting or diarrhea noted.  Patient moves all extremities.  ED Course: pt was found to have WBC 15.3, positive urinalysis (hazy appearance, large amount of leukocyte, rare bacteria, WBC>50), lactic acid 0.9, alcohol level less than 10, ammonia 23, stable renal function, temperature 98.9, heart rate 70 to 90s, oxygen saturation 95% on room air.  Chest x-ray negative.  CT head is negative for acute intracranial abnormalities, but showed a stable subcentimeter meningioma in the posterior foramen magnum region without mass effect or edema. CT of  renal stone study is a limited study, but is negative for acute findings except for known polycystic kidney disease.  Pt is placed on telemetry bed for observation.  Review of Systems:   General: has fevers, chills, no body weight gain, has fatigue HEENT: no blurry vision, hearing changes or sore throat Respiratory: no dyspnea, coughing, wheezing CV: no chest pain, no palpitations GI: no nausea, vomiting, abdominal pain, diarrhea, constipation GU: no dysuria, burning on urination, increased urinary frequency, hematuria. Has urine retension Ext: no leg edema Neuro: no unilateral weakness, numbness, or tingling, no vision change or hearing loss. Has AMS. .Skin: no rash, no skin tear. MSK: No muscle spasm, no deformity, no limitation of range of movement in spin Heme: No easy bruising.  Travel history: No recent long distant travel.  Allergy:  Allergies  Allergen Reactions   Iodine     rash   Oxycodone     Mental status changes & nausea   Pollen Extract Itching   Tamiflu [Oseltamivir Phosphate] Other (See Comments)    hallucinations   Tamsulosin     Other reaction(s): Other (See Comments) Unknown    Doxazosin Rash    agitation   Etodolac Rash    unknown   Tape Rash    Certain "Band-Aids"    Past Medical History:  Diagnosis Date   Allergy    perennial   Anxiety    Arthritis    Cholelithiasis    Chronic idiopathic constipation    CKD (chronic kidney disease)    Depression    Diverticulosis    Enlarged prostate  Gallstones    GERD (gastroesophageal reflux disease)    HLD (hyperlipidemia)    Hx of cardiovascular stress test    ETT-Myoview 6/14: probable low risk study, inf defect with normal inf wall motion-cannot r/o ischemia; prominent gut uptake adjacent to inf wall on rest images-poss defect is atten, EF 63%   Hypertension    Melanoma ([place])    Parkinsonism ([place])    Polycystic kidney disease     Past Surgical History:  Procedure Laterality Date   COLONOSCOPY  2011   diverticulosis   HERNIA REPAIR     umbilical   KNEE SURGERY Left     medialization laryngoplasty  10/15/2013   Dr Joya Gaskins, [facility]   TONSILLECTOMY AND ADENOIDECTOMY      Social History:  reports that he has never smoked. He has never used smokeless tobacco. He reports previous alcohol use. He reports that he does not use drugs.  Family History:  Family History  Problem Relation Age of Onset   Hypertension Father    Cancer Brother        mouth   Cancer Sister        breast   Heart disease Brother        heart transplant   Cancer Daughter        breast   Stroke Sister        >65   Skin cancer Brother        squamous cell   Colon cancer Neg Hx      Prior to Admission medications   Medication Sig Start Date End Date Taking? Authorizing Provider  buPROPion (WELLBUTRIN XL) 150 MG 24 hr tablet TAKE 1 TABLET ONCE DAILY. 05/06/18   [name], MD  carbidopa-levodopa (SINEMET IR) 25-100 MG tablet 1 tablet at 7am/11am/3pm and 1.5 at 7pm 02/17/18   Tat, Rebecca S, DO  Carbidopa-Levodopa ER (SINEMET CR) 25-100 MG tablet controlled release Take 1 tablet by mouth at bedtime. 01/23/18   Tat, Eustace Quail, DO  clonazePAM (KLONOPIN) 0.5 MG tablet Take 1 tablet (0.5 mg total) by mouth at bedtime. 04/10/18   Tat, Eustace Quail, DO  fexofenadine (ALLEGRA) 180 MG tablet Take 180 mg by mouth daily.      [provider]  lubiprostone (AMITIZA) 24 MCG capsule Take 1 capsule (24 mcg total) by mouth 2 (two) times daily with a meal. Patient not taking: Reported on 04/10/2018 04/01/18   Cirigliano, Vito V, DO  meclizine (ANTIVERT) 12.5 MG tablet Take 1 tablet (12.5 mg total) by mouth 3 (three) times daily as needed for dizziness. 11/12/17   Marrian Salvage, FNP  omeprazole (PRILOSEC) 40 MG capsule TAKE (1) CAPSULE DAILY. 03/19/18   [name], MD  oxymetazoline (AFRIN) 0.05 % nasal spray Place 1 spray into both nostrils 2 (two) times daily.    [provider]  Polyethylene Glycol 3350 (MIRALAX PO) Take by mouth as needed.    [provider]    Prucalopride Succinate (MOTEGRITY) 2 MG TABS Take 2 mg by mouth daily. 03/21/18   Cirigliano, Vito V, DO  Tolvaptan (JYNARQUE PO) Take by mouth daily. 7am takes 45mg  and at 3 takes the 15mg     [provider]  vitamin B-12 (CYANOCOBALAMIN) 1000 MCG tablet Take 1,000 mcg by mouth daily.    [provider]    Physical Exam: Vitals:   06/17/18 0000 06/17/18 0020 06/17/18 0056 06/17/18 0100  BP: (!) 149/91 (!) 146/86 (!) 155/95   Pulse: 77 78 77  Resp: 18 18 12    Temp:   98.1 F (36.7 C)   TempSrc:   Oral   SpO2: 98% 96% 100%   Weight:    72.4 kg  Height:    5\' 11"  (1.803 m)   General: Not in acute distress HEENT:       Eyes: PERRL, EOMI, no scleral icterus.       ENT: No discharge from the ears and nose, no pharynx injection, no tonsillar enlargement.        Neck: No JVD, no bruit, no mass felt. Heme: No neck lymph node enlargement. Cardiac: S1/S2, RRR, No murmurs, No gallops or rubs. Respiratory: No rales, wheezing, rhonchi or rubs. GI: Soft, distended in suprapubic area, nontender, no organomegaly, BS present. GU: No hematuria Ext: No pitting leg edema bilaterally. 2+DP/PT pulse bilaterally. Musculoskeletal: No joint deformities, No joint redness or warmth, no limitation of ROM in spin. Skin: No rashes.  Neuro: confused, knows his own name, and year 2020, not oriented to place. Cranial nerves II-XII grossly intact, moves all extremities. Psych: Patient is not psychotic, no suicidal or hemocidal ideation.  Labs on Admission: I have personally reviewed following labs and imaging studies  CBC: Recent Labs  Lab 06/16/18 0957 06/16/18 2050  WBC 15.5* 15.3*  NEUTROABS  --  12.2*  HGB 13.7 13.2  HCT 41.8 40.4  MCV 96.5 98.5  PLT 179 466   Basic Metabolic Panel: Recent Labs  Lab 06/16/18 0957 06/16/18 2050  NA 137 136  K 4.1 3.5  CL 103 105  CO2 26 22  GLUCOSE 100* 104*  BUN 31* 34*  CREATININE 1.85* 1.78*  CALCIUM 8.6* 8.4*   GFR: Estimated  Creatinine Clearance: 33.9 mL/min (A) (by C-G formula based on SCr of 1.78 mg/dL (H)). Liver Function Tests: Recent Labs  Lab 06/16/18 0957 06/16/18 2050  AST 16 17  ALT 11 6  ALKPHOS 88 86  BILITOT 1.8* 1.2  PROT 7.3 7.5  ALBUMIN 3.5 3.7   No results for input(s): LIPASE, AMYLASE in the last 168 hours. Recent Labs  Lab 06/16/18 2050  AMMONIA 23   Coagulation Profile: No results for input(s): INR, PROTIME in the last 168 hours. Cardiac Enzymes: No results for input(s): CKTOTAL, CKMB, CKMBINDEX, TROPONINI in the last 168 hours. BNP (last 3 results) No results for input(s): PROBNP in the last 8760 hours. HbA1C: No results for input(s): HGBA1C in the last 72 hours. CBG: Recent Labs  Lab 06/16/18 2059  GLUCAP 89   Lipid Profile: No results for input(s): CHOL, HDL, LDLCALC, TRIG, CHOLHDL, LDLDIRECT in the last 72 hours. Thyroid Function Tests: No results for input(s): TSH, T4TOTAL, FREET4, T3FREE, THYROIDAB in the last 72 hours. Anemia Panel: No results for input(s): VITAMINB12, FOLATE, FERRITIN, TIBC, IRON, RETICCTPCT in the last 72 hours. Urine analysis:    Component Value Date/Time   COLORURINE YELLOW 06/16/2018 2050   APPEARANCEUR HAZY (A) 06/16/2018 2050   LABSPEC 1.024 06/16/2018 2050   PHURINE 5.0 06/16/2018 2050   GLUCOSEU NEGATIVE 06/16/2018 2050   GLUCOSEU NEGATIVE 10/30/2017 [place] 06/16/2018 2050   BILIRUBINUR NEGATIVE 06/16/2018 2050   BILIRUBINUR negative 03/27/2018 1340   KETONESUR 5 (A) 06/16/2018 2050   PROTEINUR 100 (A) 06/16/2018 2050   UROBILINOGEN 1.0 06/16/2018 0951   NITRITE NEGATIVE 06/16/2018 2050   LEUKOCYTESUR LARGE (A) 06/16/2018 2050   Sepsis Labs: @LABRCNTIP (procalcitonin:4,lacticidven:4) ) Recent Results (from the past 240 hour(s))  SARS Coronavirus 2 (CEPHEID - Performed in [place]  hospital lab), Hosp Order     Status: None   Collection Time: 06/16/18  9:01 PM  Result Value Ref Range Status   SARS  Coronavirus 2 NEGATIVE NEGATIVE Final    Comment: (NOTE) If result is NEGATIVE SARS-CoV-2 target nucleic acids are NOT DETECTED. The SARS-CoV-2 RNA is generally detectable in upper and lower  respiratory specimens during the acute phase of infection. The lowest  concentration of SARS-CoV-2 viral copies this assay can detect is 250  copies / mL. A negative result does not preclude SARS-CoV-2 infection  and should not be used as the sole basis for treatment or other  patient management decisions.  A negative result may occur with  improper specimen collection / handling, submission of specimen other  than nasopharyngeal swab, presence of viral mutation(s) within the  areas targeted by this assay, and inadequate number of viral copies  (<250 copies / mL). A negative result must be combined with clinical  observations, patient history, and epidemiological information. If result is POSITIVE SARS-CoV-2 target nucleic acids are DETECTED. The SARS-CoV-2 RNA is generally detectable in upper and lower  respiratory specimens dur ing the acute phase of infection.  Positive  results are indicative of active infection with SARS-CoV-2.  Clinical  correlation with patient history and other diagnostic information is  necessary to determine patient infection status.  Positive results do  not rule out bacterial infection or co-infection with other viruses. If result is PRESUMPTIVE POSTIVE SARS-CoV-2 nucleic acids MAY BE PRESENT.   A presumptive positive result was obtained on the submitted specimen  and confirmed on repeat testing.  While 2019 novel coronavirus  (SARS-CoV-2) nucleic acids may be present in the submitted sample  additional confirmatory testing may be necessary for epidemiological  and / or clinical management purposes  to differentiate between  SARS-CoV-2 and other Sarbecovirus currently known to infect humans.  If clinically indicated additional testing with an alternate test    methodology [phone]) is advised. The SARS-CoV-2 RNA is generally  detectable in upper and lower respiratory sp ecimens during the acute  phase of infection. The expected result is Negative. Fact Sheet for Patients:  StrictlyIdeas.no Fact Sheet for Healthcare Providers: BankingDealers.co.za This test is not yet approved or cleared by the [place] FDA and has been authorized for detection and/or diagnosis of SARS-CoV-2 by FDA under an Emergency Use Authorization (EUA).  This EUA will remain in effect (meaning this test can be used) for the duration of the COVID-19 declaration under Section 564(b)(1) of the Act, 21 U.S.C. section 360bbb-3(b)(1), unless the authorization is terminated or revoked sooner. Performed at [facility], [place] [address] [place], [place] [address]      Radiological Exams on Admission: Dg Chest 2 View  Result Date: 06/16/2018 CLINICAL DATA:  Fever for 1 day. EXAM: CHEST - 2 VIEW COMPARISON:  Chest x-ray dated 03/27/2018 FINDINGS: Heart size and mediastinal contours are within normal limits. Lungs are hyperexpanded. Lungs are clear. No pleural effusion seen. No acute or suspicious osseous finding. IMPRESSION: 1. No active cardiopulmonary disease. No evidence of pneumonia or pulmonary edema. 2. Hyperexpanded lungs indicating COPD. Electronically Signed   By: Franki Cabot M.D.   On: 06/16/2018 10:24   Ct Head Wo Contrast  Result Date: 06/16/2018 CLINICAL DATA:  Fever and altered mental status EXAM: CT HEAD WITHOUT CONTRAST TECHNIQUE: Contiguous axial images were obtained from the base of the skull through the vertex without intravenous contrast. COMPARISON:  Head CT August 17, 2017 and  brain MRI December 11, 2017; earlier brain MRI April 25, 2015 FINDINGS: Brain: Mild diffuse atrophy is stable. There is a stable meningioma at the posterior foramen magnum level measuring 8 x 8 mm. This small meningioma  does not show evident mass effect or edema. No other mass is evident. There is no hemorrhage, extra-axial fluid collection, or midline shift. There is patchy small vessel disease in the centra semiovale bilaterally. There is no evident acute infarct. Vascular: No hyperdense vessel. There is calcification in each carotid siphon region. Skull: The bony calvarium appears intact. Sinuses/Orbits: There is mucosal thickening in several ethmoid air cells. Other visualized paranasal sinuses are clear. Visualized orbits appear symmetric bilaterally. Other: Mastoid air cells are clear. IMPRESSION: Stable atrophy with periventricular small vessel disease. No evident acute infarct. Stable subcentimeter meningioma in the posterior foramen magnum region without mass effect or edema. No new mass evident. No hemorrhage. There are foci of arterial vascular calcification. There is mucosal thickening in several ethmoid air cells. Electronically Signed   By: Lowella Grip III M.D.   On: 06/16/2018 21:57   Dg Chest Port 1 View  Result Date: 06/16/2018 CLINICAL DATA:  Fever EXAM: PORTABLE CHEST 1 VIEW COMPARISON:  06/16/2018 FINDINGS: The cardiac silhouette is stable. There is no pneumothorax. No large pleural effusion. No significant area of consolidation. The lungs are somewhat hyperexpanded. IMPRESSION: No active disease. Electronically Signed   By: Constance Holster M.D.   On: 06/16/2018 22:05   Ct Renal Stone Study  Result Date: 06/16/2018 CLINICAL DATA:  Fever.  Confusion. EXAM: CT ABDOMEN AND PELVIS WITHOUT CONTRAST TECHNIQUE: Multidetector CT imaging of the abdomen and pelvis was performed following the standard protocol without IV contrast. COMPARISON:  CT dated 07/01/2006. FINDINGS: Lower chest: No acute abnormality. Hepatobiliary: Innumerable cysts are noted in the liver. The gallbladder is unremarkable. Pancreas: The pancreas is suboptimally evaluated in the absence of IV contrast. There is no definite pancreatic  mass. Spleen: Normal in size without focal abnormality. Adrenals/Urinary Tract: There are innumerable large cysts throughout both kidneys. Both kidneys are enlarged. There is some fullness of both collecting systems without evidence of an obstructing stone. The bladder is decompressed which limits evaluation. Stomach/Bowel: The bowel is suboptimally evaluated secondary to lack of both oral and IV contrast. Additionally much of the bowel is compressed secondary to the patient's enlarged kidneys. Given these limitations, no definite abnormality is detected. There is no small bowel obstruction. The appendix is not well identified. Vascular/Lymphatic: Aortic atherosclerosis. No enlarged abdominal or pelvic lymph nodes. Reproductive: Prostate gland is significantly enlarged. There is a trace amount of free fluid in the patient's pelvis. Other: No abdominal wall hernia or abnormality. No abdominopelvic ascites. Musculoskeletal: No acute or significant osseous findings. IMPRESSION: 1. Limited study as detailed above. 2. No definite acute abnormality detected. 3. Again identified are findings of polycystic kidney disease. Electronically Signed   By: Constance Holster M.D.   On: 06/16/2018 23:18     EKG: Independently reviewed.  Sinus rhythm, QTC 442, low voltage, LAD, poor R wave progression, nonspecific T wave change.    Assessment/Plan Principal Problem:   UTI (urinary tract infection) Active Problems:   Essential hypertension   G E R D   Polycystic kidney   BPH associated with nocturia   Anxiety and depression   CRI (chronic renal insufficiency), stage 3 (moderate) (HCC)   Acute metabolic encephalopathy   Parkinsonism (HCC)   UTI (urinary tract infection): CT of  renal stone study is a  limited study, but is negative for acute findings except for known polycystic kidney disease.  Patient has leukocytosis 15.3, currently no fever.  Does not meet criteria for sepsis currently.  -  Place on tele bed  for obs -  Ceftriaxone by IV (pt received 1 dose of vancomycin, cefepime and Flagyl in ED) - Follow up results of urine and blood cx and amend antibiotic regimen if needed per sensitivity results - prn Zofran for nausea - IVF: 1L NS in ED - Foley cath placement  BPH associated with nocturia and urinary retention -Foley cath placement  Essential hypertension: bp 138/84 -IV hydralazine as needed  G E R D: -Protonix  Anxiety and depression:  -continue home Klonopin and Wellbutrin  Hx of polycystic kidney and CRI (chronic renal insufficiency), stage 3 (moderate) ([place]): stable.  Baseline creatinine 1.5-1.8.  His creatinine is 1.78, BUN 34. -f/u by BMP  Parkinsonism: -Sinemet  Acute metabolic encephalopathy: Likely due to UTI.  CT head is negative for acute intracranial abnormalities.  No focal neurologic findings on physical examination. -Frequent neuro check -keep pt NPO until mental status improves       DVT ppx: SQ Heparin  Code Status: Full code Family Communication: None at bed side.     Disposition Plan:  Anticipate discharge back to previous home environment Consults called:  none Admission status: Obs / tele      Date of Service 06/17/2018    [facility]   If 7PM-7AM, please contact night-coverage [email or web address] Password [email or web address] 06/17/2018, 2:22 AM

## 2018-06-16 NOTE — ED Triage Notes (Signed)
Pt presents with complaints of fever off and on x 2 days. As high as 102 at home, with increased confusion. Pt is at his baseline confusion per family at this time. Pt is afebrile today, last dose of Tylenol last night. Patient denies any other symptoms. Daughter states he did have a urinary catheter completed on Thursday. Denies any urinary symptoms. Reports that his pcp told him to go to the ER last night, decided to wait to be seen here today.

## 2018-06-16 NOTE — ED Provider Notes (Signed)
  Face-to-face evaluation   History: He presents for evaluation of abdominal pain, and malaise.  He denies vomiting or dizziness.  Physical exam: Elderly man who is alert and cooperative.  Abdomen is nontender.  No respiratory distress.  Medical screening examination/treatment/procedure(s) were conducted as a shared visit with non-physician practitioner(s) and myself.  I personally evaluated the patient during the encounter   Daleen Bo, MD 06/19/18 [phone]

## 2018-06-16 NOTE — ED Provider Notes (Addendum)
[place]    CSN: [id number] Arrival date & time: 06/16/18  6606     History   Chief Complaint Chief Complaint  Patient presents with  . Fever    HPI Henry Adams is a 78 y.o. male history of hypertension, hyperlipidemia, BPH, polycystic kidney disease, parkinsonism, presenting today for evaluation of fever.  Patient had fever last night of 101.7 around 6 PM.  He was given Tylenol and fever subsided accordingly.  He is not had return of his fever today.  He did seem slightly more confused last night while he had the fever but he has returned to baseline according to his daughter.  Patient lives with his other daughter and their family.  He denies any respiratory symptoms of cough, congestion or sore throat.  He does have an occasional nasal drainage but this is normal.  Denies any chest pain or shortness of breath.  Did have some diarrhea this morning, has had associated mild abdominal discomfort with this.  Denies nausea or vomiting.  Has been eating and drinking like normal.  Denies any dysuria or increased frequency in urination, denies hematuria.  Patient did have catheter to evaluate for urinary retention on Thursday by urologist.  Has plans to follow-up with PCP on Thursday for further evaluation of persistent fatigue that he has been experiencing over the past month.  Denies weakness on one side of body, denies vision changes or difficulty speaking.  Has had some mild intermittent headaches, but these are not persistent.  Daughter denies changes in gait.  HPI  Past Medical History:  Diagnosis Date  . Allergy    perennial  . Anxiety   . Arthritis   . Cholelithiasis   . Chronic idiopathic constipation   . CKD (chronic kidney disease)   . Depression   . Diverticulosis   . Enlarged prostate   . Gallstones   . GERD (gastroesophageal reflux disease)   . HLD (hyperlipidemia)   . Hx of cardiovascular stress test    ETT-Myoview 6/14: probable low risk study, inf  defect with normal inf wall motion-cannot r/o ischemia; prominent gut uptake adjacent to inf wall on rest images-poss defect is atten, EF 63%  . Hypertension   . Melanoma ([place])   . Parkinsonism ([place])   . Polycystic kidney disease     Patient Active Problem List   Diagnosis Date Noted  . Confusion 03/27/2018  . Generalized abdominal tenderness without rebound tenderness 03/27/2018  . CRI (chronic renal insufficiency), stage 3 (moderate) ([place]) 03/27/2018  . Cough 03/25/2018  . Influenza A 03/25/2018  . Restless leg syndrome 01/23/2018  . Abdominal distension 12/25/2017  . Calculus of gallbladder with chronic cholecystitis without obstruction 09/18/2017  . Chronic idiopathic constipation 08/19/2017  . Age-related cognitive decline 01/09/2017  . Anxiety and depression 10/24/2016  . Insomnia 10/24/2016  . BPH associated with nocturia 08/11/2015  . Routine general medical examination at a health care facility 08/11/2015  . Vocal cord atrophy 09/01/2013  . Essential hypertension 04/26/2007  . Allergic rhinitis 04/26/2007  . DEGENERATIVE JOINT DISEASE 04/26/2007  . G E R D 08/05/2006  . Helicobacter pylori infection 05/30/2006  . Hyperlipidemia with target LDL less than 160 05/30/2006  . Polycystic kidney 05/30/2006    Past Surgical History:  Procedure Laterality Date  . COLONOSCOPY  2011   diverticulosis  . HERNIA REPAIR     umbilical  . KNEE SURGERY Left   . medialization laryngoplasty  10/15/2013   Dr Joya Gaskins, [facility]  .  TONSILLECTOMY AND ADENOIDECTOMY         Home Medications    Prior to Admission medications   Medication Sig Start Date End Date Taking? Authorizing Provider  buPROPion (WELLBUTRIN XL) 150 MG 24 hr tablet TAKE 1 TABLET ONCE DAILY. 05/06/18   [name], MD  carbidopa-levodopa (SINEMET IR) 25-100 MG tablet 1 tablet at 7am/11am/3pm and 1.5 at 7pm 02/17/18   Tat, Rebecca S, DO  Carbidopa-Levodopa ER (SINEMET CR) 25-100 MG tablet controlled release Take 1  tablet by mouth at bedtime. 01/23/18   Tat, Eustace Quail, DO  clonazePAM (KLONOPIN) 0.5 MG tablet Take 1 tablet (0.5 mg total) by mouth at bedtime. 04/10/18   Tat, Eustace Quail, DO  fexofenadine (ALLEGRA) 180 MG tablet Take 180 mg by mouth daily.      [provider]  lubiprostone (AMITIZA) 24 MCG capsule Take 1 capsule (24 mcg total) by mouth 2 (two) times daily with a meal. Patient not taking: Reported on 04/10/2018 04/01/18   Cirigliano, Vito V, DO  meclizine (ANTIVERT) 12.5 MG tablet Take 1 tablet (12.5 mg total) by mouth 3 (three) times daily as needed for dizziness. 11/12/17   Marrian Salvage, FNP  omeprazole (PRILOSEC) 40 MG capsule TAKE (1) CAPSULE DAILY. 03/19/18   [name], MD  oxymetazoline (AFRIN) 0.05 % nasal spray Place 1 spray into both nostrils 2 (two) times daily.    [provider]  Polyethylene Glycol 3350 (MIRALAX PO) Take by mouth as needed.    [provider]  Prucalopride Succinate (MOTEGRITY) 2 MG TABS Take 2 mg by mouth daily. 03/21/18   Cirigliano, Vito V, DO  Tolvaptan (JYNARQUE PO) Take by mouth daily. 7am takes 45mg  and at 3 takes the 15mg     [provider]  vitamin B-12 (CYANOCOBALAMIN) 1000 MCG tablet Take 1,000 mcg by mouth daily.    [provider]    Family History Family History  Problem Relation Age of Onset  . Hypertension Father   . Cancer Brother        mouth  . Cancer Sister        breast  . Heart disease Brother        heart transplant  . Cancer Daughter        breast  . Stroke Sister        >10  . Skin cancer Brother        squamous cell  . Colon cancer Neg Hx     Social History Social History   Tobacco Use  . Smoking status: Never Smoker  . Smokeless tobacco: Never Used  Substance Use Topics  . Alcohol use: Not Currently    Alcohol/week: 0.0 standard drinks  . Drug use: No     Allergies   Iodine; Oxycodone; Pollen extract; Tamiflu [oseltamivir phosphate]; Tamsulosin; Doxazosin;  Etodolac; and Tape   Review of Systems Review of Systems  Constitutional: Negative for fatigue and fever.  HENT: Negative for congestion, sinus pressure and sore throat.   Eyes: Negative for photophobia, pain and visual disturbance.  Respiratory: Negative for cough and shortness of breath.   Cardiovascular: Negative for chest pain.  Gastrointestinal: Positive for abdominal pain and diarrhea. Negative for nausea and vomiting.  Genitourinary: Negative for decreased urine volume and hematuria.  Musculoskeletal: Negative for myalgias, neck pain and neck stiffness.  Neurological: Positive for headaches. Negative for dizziness, syncope, facial asymmetry, speech difficulty, weakness, light-headedness and numbness.     Physical Exam Triage Vital Signs ED  Triage Vitals  Enc Vitals Group     BP 06/16/18 0933 116/76     Pulse Rate 06/16/18 0933 86     Resp 06/16/18 0933 19     Temp 06/16/18 0933 98.3 F (36.8 C)     Temp src --      SpO2 06/16/18 0933 98 %     Weight --      Height --      Head Circumference --      Peak Flow --      Pain Score 06/16/18 0931 4     Pain Loc --      Pain Edu? --      Excl. in [place]? --    No data found.  Updated Vital Signs BP 116/76   Pulse 86   Temp 98.3 F (36.8 C)   Resp 19   SpO2 98%   Visual Acuity Right Eye Distance:   Left Eye Distance:   Bilateral Distance:    Right Eye Near:   Left Eye Near:    Bilateral Near:     Physical Exam Vitals signs and nursing note reviewed.  Constitutional:      Appearance: He is well-developed.     Comments: Sitting comfortably on exam table  HENT:     Head: Normocephalic and atraumatic.     Ears:     Comments: Bilateral ears without tenderness to palpation of external auricle, tragus and mastoid, EAC's without erythema or swelling, TM's with good bony landmarks and cone of light. Non erythematous.    Mouth/Throat:     Comments: Oral mucosa pink and moist, no tonsillar enlargement or exudate.  Posterior pharynx patent and nonerythematous, no uvula deviation or swelling. Normal phonation. Palate elevating symmetrically Eyes:     Extraocular Movements: Extraocular movements intact.     Conjunctiva/sclera: Conjunctivae normal.     Pupils: Pupils are equal, round, and reactive to light.  Neck:     Musculoskeletal: Neck supple.  Cardiovascular:     Rate and Rhythm: Normal rate and regular rhythm.     Heart sounds: No murmur.  Pulmonary:     Effort: Pulmonary effort is normal. No respiratory distress.     Breath sounds: Normal breath sounds.  Abdominal:     Palpations: Abdomen is soft.     Tenderness: There is no abdominal tenderness.     Comments: Soft, Slightly distended, well healed surgical scar above umbilicus. Nontender to epigastrium and bilateral upper quadrants, mild tenderness to bilateral lower quadrants - daughter states this is normal for him due to his kidney cysts.   Skin:    General: Skin is warm and dry.  Neurological:     General: No focal deficit present.     Mental Status: He is alert and oriented to person, place, and time. Mental status is at baseline.     Comments: Patient is oriented to person, current time and place; when initially questioned about a year he stated the year was 2002, after clarification he did agree that it was 2020 2002.   Cranial nerves II-XII grossly intact, strength at shoulders, hips and knees 5/5, equal bilaterally, patellar reflex 1+ bilaterally. Gait slow but without abnormality.      UC Treatments / Results  Labs (all labs ordered are listed, but only abnormal results are displayed) Labs Reviewed  POCT URINALYSIS DIP (DEVICE) - Abnormal; Notable for the following components:      Result Value   Bilirubin Urine SMALL (*)  Protein, ur 30 (*)    Leukocytes,Ua TRACE (*)    All other components within normal limits  URINE CULTURE  CBC  COMPREHENSIVE METABOLIC PANEL    EKG None  Radiology Dg Chest 2 View  Result  Date: 06/16/2018 CLINICAL DATA:  Fever for 1 day. EXAM: CHEST - 2 VIEW COMPARISON:  Chest x-ray dated 03/27/2018 FINDINGS: Heart size and mediastinal contours are within normal limits. Lungs are hyperexpanded. Lungs are clear. No pleural effusion seen. No acute or suspicious osseous finding. IMPRESSION: 1. No active cardiopulmonary disease. No evidence of pneumonia or pulmonary edema. 2. Hyperexpanded lungs indicating COPD. Electronically Signed   By: Franki Cabot M.D.   On: 06/16/2018 10:24    Procedures Procedures (including critical care time)  Medications Ordered in UC Medications - No data to display  Initial Impression / Assessment and Plan / UC Course  I have reviewed the triage vital signs and the nursing notes.  Pertinent labs & imaging results that were available during my care of the patient were reviewed by me and considered in my medical decision making (see chart for details).    Chest Xray negative. UA not suggestive of UTI, will check culture to confirm. CBC and CMP obtained will call patient/dauther with results.Do not suspect stroke or cardiac etiology at this time. Unclear cause of fever last night.VSS in clinic today without fever, tachycardia or hypoxia. Continue to monitor fever, symptoms and mental status in the meantime. Follow up with PCP on Thursday as planned. Discussed strict return precautions. Patient verbalized understanding and is agreeable with plan.  Final Clinical Impressions(s) / UC Diagnoses   Final diagnoses:  Fever of unknown origin     Discharge Instructions     Urine did not show signs of infection, we will send this for culture to confirm. Chest x-ray was normal, no signs of pneumonia. I will call with results of blood work if abnormal. Please continue to monitor temperature, follow-up with PCP on Thursday as planned  If developing any increased confusion, weakness, changes from his baseline please follow-up in the emergency room.    ED  Prescriptions    None     Controlled Substance Prescriptions [place] Controlled Substance Registry consulted? Not Applicable     [name], PA-C 06/16/18 1047    [place], [place] C, [place] 06/16/18 1048

## 2018-06-16 NOTE — ED Notes (Signed)
Pt's bladder scan: 438mL after voiding in urinal

## 2018-06-16 NOTE — ED Provider Notes (Signed)
[facility] DEPT Provider Note   CSN: [id number] Arrival date & time: 06/16/18  1832    History   Chief Complaint Chief Complaint  Patient presents with   fever    HPI Henry Adams is a 78 y.o. male with a past medical history of Parkinson's, memory issues, polycystic kidneys, prostate hypertrophy, who presents emergency department chief complaint of fever and altered mental status.  There is a level 5 caveat due to altered mental status.  I had a conversation with the patient's daughter Amy who gives the history.  She states that he began running a fever yesterday.  They kept him at home and then brought him to an urgent care later in the day.  He had a urine dipstick and a chest x-ray which were negative at that time.  He began running a fever again and was brought into the emergency department.  He ran a fever up to 101.3 prior to arrival and was given Tylenol.  His daughter states that he is also had worsening of his baseline mental status.  She states that normally he has some word finding difficulty and difficulty expressing himself however it is way beyond his normal mentation.  She states that he also was very weak today when his fever was quite high.  Patient's daughter Amy also states that a couple of days ago they were at the urologist office and they did a bladder scan that showed up a lot of urine in there however they were unable to cath him she is concerned that he may have urinary retention and a bladder infection.  He has not had cough.  He has had no COVID exposures.     HPI  Past Medical History:  Diagnosis Date   Allergy    perennial   Anxiety    Arthritis    Cholelithiasis    Chronic idiopathic constipation    CKD (chronic kidney disease)    Depression    Diverticulosis    Enlarged prostate    Gallstones    GERD (gastroesophageal reflux disease)    HLD (hyperlipidemia)    Hx of cardiovascular stress test    ETT-Myoview 6/14: probable low risk study, inf defect with normal inf wall motion-cannot r/o ischemia; prominent gut uptake adjacent to inf wall on rest images-poss defect is atten, EF 63%   Hypertension    Melanoma ([place])    Parkinsonism ([place])    Polycystic kidney disease     Patient Active Problem List   Diagnosis Date Noted   Confusion 03/27/2018   Generalized abdominal tenderness without rebound tenderness 03/27/2018   CRI (chronic renal insufficiency), stage 3 (moderate) ([place]) 03/27/2018   Cough 03/25/2018   Influenza A 03/25/2018   Restless leg syndrome 01/23/2018   Abdominal distension 12/25/2017   Calculus of gallbladder with chronic cholecystitis without obstruction 09/18/2017   Chronic idiopathic constipation 08/19/2017   Age-related cognitive decline 01/09/2017   Anxiety and depression 10/24/2016   Insomnia 10/24/2016   BPH associated with nocturia 08/11/2015   Routine general medical examination at a health care facility 08/11/2015   Vocal cord atrophy 09/01/2013   Essential hypertension 04/26/2007   Allergic rhinitis 04/26/2007   DEGENERATIVE JOINT DISEASE 04/26/2007   G E R D 73/22/0254   Helicobacter pylori infection 05/30/2006   Hyperlipidemia with target LDL less than 160 05/30/2006   Polycystic kidney 05/30/2006    Past Surgical History:  Procedure Laterality Date   COLONOSCOPY  2011   diverticulosis  HERNIA REPAIR     umbilical   KNEE SURGERY Left    medialization laryngoplasty  10/15/2013   Dr Joya Gaskins, [place] Medications    Prior to Admission medications   Medication Sig Start Date End Date Taking? Authorizing Provider  buPROPion (WELLBUTRIN XL) 150 MG 24 hr tablet TAKE 1 TABLET ONCE DAILY. 05/06/18   [name], MD  carbidopa-levodopa (SINEMET IR) 25-100 MG tablet 1 tablet at 7am/11am/3pm and 1.5 at 7pm 02/17/18   Tat, Rebecca S, DO  Carbidopa-Levodopa ER (SINEMET  CR) 25-100 MG tablet controlled release Take 1 tablet by mouth at bedtime. 01/23/18   Tat, Eustace Quail, DO  clonazePAM (KLONOPIN) 0.5 MG tablet Take 1 tablet (0.5 mg total) by mouth at bedtime. 04/10/18   Tat, Eustace Quail, DO  fexofenadine (ALLEGRA) 180 MG tablet Take 180 mg by mouth daily.      [provider]  lubiprostone (AMITIZA) 24 MCG capsule Take 1 capsule (24 mcg total) by mouth 2 (two) times daily with a meal. Patient not taking: Reported on 04/10/2018 04/01/18   Cirigliano, Vito V, DO  meclizine (ANTIVERT) 12.5 MG tablet Take 1 tablet (12.5 mg total) by mouth 3 (three) times daily as needed for dizziness. 11/12/17   Marrian Salvage, FNP  omeprazole (PRILOSEC) 40 MG capsule TAKE (1) CAPSULE DAILY. 03/19/18   [name], MD  oxymetazoline (AFRIN) 0.05 % nasal spray Place 1 spray into both nostrils 2 (two) times daily.    [provider]  Polyethylene Glycol 3350 (MIRALAX PO) Take by mouth as needed.    [provider]  Prucalopride Succinate (MOTEGRITY) 2 MG TABS Take 2 mg by mouth daily. 03/21/18   Cirigliano, Vito V, DO  Tolvaptan (JYNARQUE PO) Take by mouth daily. 7am takes 45mg  and at 3 takes the 15mg     [provider]  vitamin B-12 (CYANOCOBALAMIN) 1000 MCG tablet Take 1,000 mcg by mouth daily.    [provider]    Family History Family History  Problem Relation Age of Onset   Hypertension Father    Cancer Brother        mouth   Cancer Sister        breast   Heart disease Brother        heart transplant   Cancer Daughter        breast   Stroke Sister        >65   Skin cancer Brother        squamous cell   Colon cancer Neg Hx     Social History Social History   Tobacco Use   Smoking status: Never Smoker   Smokeless tobacco: Never Used  Substance Use Topics   Alcohol use: Not Currently    Alcohol/week: 0.0 standard drinks   Drug use: No     Allergies   Iodine; Oxycodone; Pollen extract; Tamiflu  [oseltamivir phosphate]; Tamsulosin; Doxazosin; Etodolac; and Tape   Review of Systems Review of Systems  Unable to perform ROS: Mental status change    . Physical Exam Updated Vital Signs BP 138/84    Pulse 74    Temp 98.2 F (36.8 C) (Rectal)    Resp 18    SpO2 97%   Physical Exam Vitals signs and nursing note reviewed.  Constitutional:      General: He is not in acute distress.    Appearance: He is well-developed. He  is not diaphoretic.  HENT:     Head: Normocephalic and atraumatic.  Eyes:     General: No scleral icterus.    Conjunctiva/sclera: Conjunctivae normal.  Neck:     Musculoskeletal: Normal range of motion and neck supple.  Cardiovascular:     Rate and Rhythm: Normal rate and regular rhythm.     Heart sounds: Normal heart sounds.  Pulmonary:     Effort: Pulmonary effort is normal. No respiratory distress.     Breath sounds: Normal breath sounds.  Abdominal:     General: There is distension.     Palpations: Abdomen is soft.     Tenderness: There is no abdominal tenderness.  Skin:    General: Skin is warm and dry.  Neurological:     Mental Status: He is alert.  Psychiatric:        Behavior: Behavior normal.      ED Treatments / Results  Labs (all labs ordered are listed, but only abnormal results are displayed) Labs Reviewed  CBC WITH DIFFERENTIAL/PLATELET - Abnormal; Notable for the following components:      Result Value   WBC 15.3 (*)    RBC 4.10 (*)    Neutro Abs 12.2 (*)    Monocytes Absolute 1.7 (*)    All other components within normal limits  COMPREHENSIVE METABOLIC PANEL - Abnormal; Notable for the following components:   Glucose, Bld 104 (*)    BUN 34 (*)    Creatinine, Ser 1.78 (*)    Calcium 8.4 (*)    GFR calc non Af Amer 35 (*)    GFR calc Af Amer 41 (*)    All other components within normal limits  URINALYSIS, ROUTINE W REFLEX MICROSCOPIC - Abnormal; Notable for the following components:   APPearance HAZY (*)    Ketones, ur 5  (*)    Protein, ur 100 (*)    Leukocytes,Ua LARGE (*)    WBC, UA >50 (*)    Bacteria, UA RARE (*)    Non Squamous Epithelial 0-5 (*)    All other components within normal limits  SARS CORONAVIRUS 2 (HOSPITAL ORDER, [facility])  CULTURE, BLOOD (ROUTINE X 2)  CULTURE, BLOOD (ROUTINE X 2)  URINE CULTURE  LACTIC ACID, PLASMA  AMMONIA  RAPID URINE DRUG SCREEN, HOSP PERFORMED  ETHANOL  CBG MONITORING, ED    EKG EKG Interpretation  Date/Time:  Monday Jun 16 2018 20:47:53 EDT Ventricular Rate:  83 PR Interval:    QRS Duration: 105 QT Interval:  376 QTC Calculation: 442 R Axis:   -52 Text Interpretation:  Sinus rhythm Left anterior fascicular block Anterolateral infarct, age indeterminate since last tracing no significant change Confirmed by Daleen Bo [phone]) on 06/16/2018 9:20:52 PM   Radiology Dg Chest 2 View  Result Date: 06/16/2018 CLINICAL DATA:  Fever for 1 day. EXAM: CHEST - 2 VIEW COMPARISON:  Chest x-ray dated 03/27/2018 FINDINGS: Heart size and mediastinal contours are within normal limits. Lungs are hyperexpanded. Lungs are clear. No pleural effusion seen. No acute or suspicious osseous finding. IMPRESSION: 1. No active cardiopulmonary disease. No evidence of pneumonia or pulmonary edema. 2. Hyperexpanded lungs indicating COPD. Electronically Signed   By: Franki Cabot M.D.   On: 06/16/2018 10:24   Ct Head Wo Contrast  Result Date: 06/16/2018 CLINICAL DATA:  Fever and altered mental status EXAM: CT HEAD WITHOUT CONTRAST TECHNIQUE: Contiguous axial images were obtained from the base of the skull through the vertex without  intravenous contrast. COMPARISON:  Head CT August 17, 2017 and brain MRI December 11, 2017; earlier brain MRI April 25, 2015 FINDINGS: Brain: Mild diffuse atrophy is stable. There is a stable meningioma at the posterior foramen magnum level measuring 8 x 8 mm. This small meningioma does not show evident mass effect or edema. No  other mass is evident. There is no hemorrhage, extra-axial fluid collection, or midline shift. There is patchy small vessel disease in the centra semiovale bilaterally. There is no evident acute infarct. Vascular: No hyperdense vessel. There is calcification in each carotid siphon region. Skull: The bony calvarium appears intact. Sinuses/Orbits: There is mucosal thickening in several ethmoid air cells. Other visualized paranasal sinuses are clear. Visualized orbits appear symmetric bilaterally. Other: Mastoid air cells are clear. IMPRESSION: Stable atrophy with periventricular small vessel disease. No evident acute infarct. Stable subcentimeter meningioma in the posterior foramen magnum region without mass effect or edema. No new mass evident. No hemorrhage. There are foci of arterial vascular calcification. There is mucosal thickening in several ethmoid air cells. Electronically Signed   By: Lowella Grip III M.D.   On: 06/16/2018 21:57   Dg Chest Port 1 View  Result Date: 06/16/2018 CLINICAL DATA:  Fever EXAM: PORTABLE CHEST 1 VIEW COMPARISON:  06/16/2018 FINDINGS: The cardiac silhouette is stable. There is no pneumothorax. No large pleural effusion. No significant area of consolidation. The lungs are somewhat hyperexpanded. IMPRESSION: No active disease. Electronically Signed   By: Constance Holster M.D.   On: 06/16/2018 22:05    Procedures Procedures (including critical care time)  Medications Ordered in ED Medications  metroNIDAZOLE (FLAGYL) IVPB 500 mg (500 mg Intravenous New Bag/Given 06/16/18 2213)  vancomycin (VANCOCIN) IVPB 1000 mg/200 mL premix (has no administration in time range)  ceFEPIme (MAXIPIME) 2 g in sodium chloride 0.9 % 100 mL IVPB (2 g Intravenous New Bag/Given 06/16/18 2208)  sodium chloride 0.9 % bolus 1,000 mL (1,000 mLs Intravenous New Bag/Given 06/16/18 2204)     Initial Impression / Assessment and Plan / ED Course  I have reviewed the triage vital signs and the  nursing notes.  Pertinent labs & imaging results that were available during my care of the patient were reviewed by me and considered in my medical decision making (see chart for details).  Clinical Course as of Jun 15 2252  Mon Jun 16, 2018  2219 468 ml in bladder    [AH]    Clinical Course User Index [AH] Margarita Mail, PA-C        CC: Fever, altered mental status VS: BP (!) 145/93    Pulse 74    Temp 98.2 F (36.8 C) (Rectal)    Resp 17    SpO2 98%  NU:UVOZDGU is gathered by patient's daughter and EMR. DDX:The differential diagnosis for AMS is extensive and includes, but is not limited to: drug overdose - opioids, alcohol, sedatives, antipsychotics, drug withdrawal, others; Metabolic: hypoxia, hypoglycemia, hyperglycemia, hypercalcemia, hypernatremia, hyponatremia, uremia, hepatic encephalopathy, hypothyroidism, hyperthyroidism, vitamin B12 or thiamine deficiency, carbon monoxide poisoning, Wilson's disease, Lactic acidosis, DKA/HHOS; Infectious: meningitis, encephalitis, bacteremia/sepsis, urinary tract infection, pneumonia, neurosyphilis; Structural: Space-occupying lesion, (brain tumor, subdural hematoma, hydrocephalus,); Vascular: stroke, subarachnoid hemorrhage, coronary ischemia, hypertensive encephalopathy, CNS vasculitis, thrombotic thrombocytopenic purpura, disseminated intravascular coagulation, hyperviscosity; Psychiatric: Schizophrenia, depression; Other: Seizure, hypothermia, heat stroke, ICU psychosis, dementia -"sundowning." Labs: I reviewed the labs which show urine positive for infection with bacteria, greater than 50 white blood cells, negative UDS, CMP shows creatinine at baseline.  Negative  lactic acidosis, negative coronavirus, CBG 89, normal ammonia level.. Urine and blood cultures in progress. Imaging: I personally reviewed the images (chest x-ray, CT renal stone study and CT head) which show(s) chest x-ray shows no acute abnormalities.  CT renal stone shows  polycystic kidneys, and CT head is without other acute abnormalities. EKG:  EKG Interpretation  Date/Time:  Monday Jun 16 2018 20:47:53 EDT Ventricular Rate:  83 PR Interval:    QRS Duration: 105 QT Interval:  376 QTC Calculation: 442 R Axis:   -52 Text Interpretation:  Sinus rhythm Left anterior fascicular block Anterolateral infarct, age indeterminate since last tracing no significant change Confirmed by Daleen Bo [phone]) on 06/16/2018 9:20:52 PM      MDM: Patient with urinary tract infection, urinary retention and toxic metabolic encephalopathy.  Patient will be admitted to the hospitalist service.  I have informed the patient's daughter of the findings. Patient disposition: Admit Patient condition: Good. The patient appears reasonably stabilized for admission considering the current resources, flow, and capabilities available in the ED at this time, and I doubt any other [facility] requiring further screening and/or treatment in the ED prior to admission.  Final Clinical Impressions(s) / ED Diagnoses   Final diagnoses:  Acute cystitis without hematuria  Urinary retention  Toxic metabolic encephalopathy    ED Discharge Orders    None       Margarita Mail, PA-C 06/16/18 2347    Daleen Bo, MD 06/19/18 [phone]

## 2018-06-16 NOTE — ED Notes (Signed)
Lab called at this time to add on urine culture.

## 2018-06-17 ENCOUNTER — Encounter (HOSPITAL_COMMUNITY): Payer: Self-pay | Admitting: Internal Medicine

## 2018-06-17 ENCOUNTER — Telehealth: Payer: Self-pay | Admitting: Internal Medicine

## 2018-06-17 DIAGNOSIS — N183 Chronic kidney disease, stage 3 (moderate): Secondary | ICD-10-CM

## 2018-06-17 DIAGNOSIS — G9341 Metabolic encephalopathy: Secondary | ICD-10-CM | POA: Diagnosis not present

## 2018-06-17 DIAGNOSIS — N3000 Acute cystitis without hematuria: Secondary | ICD-10-CM

## 2018-06-17 DIAGNOSIS — F419 Anxiety disorder, unspecified: Secondary | ICD-10-CM | POA: Diagnosis not present

## 2018-06-17 DIAGNOSIS — F329 Major depressive disorder, single episode, unspecified: Secondary | ICD-10-CM

## 2018-06-17 DIAGNOSIS — I10 Essential (primary) hypertension: Secondary | ICD-10-CM

## 2018-06-17 DIAGNOSIS — G20 Parkinson's disease: Secondary | ICD-10-CM | POA: Diagnosis present

## 2018-06-17 DIAGNOSIS — K219 Gastro-esophageal reflux disease without esophagitis: Secondary | ICD-10-CM

## 2018-06-17 DIAGNOSIS — N401 Enlarged prostate with lower urinary tract symptoms: Secondary | ICD-10-CM | POA: Diagnosis not present

## 2018-06-17 DIAGNOSIS — N390 Urinary tract infection, site not specified: Secondary | ICD-10-CM

## 2018-06-17 DIAGNOSIS — B952 Enterococcus as the cause of diseases classified elsewhere: Secondary | ICD-10-CM

## 2018-06-17 DIAGNOSIS — Q613 Polycystic kidney, unspecified: Secondary | ICD-10-CM

## 2018-06-17 DIAGNOSIS — R351 Nocturia: Secondary | ICD-10-CM

## 2018-06-17 LAB — CBC
HCT: 41 % (ref 39.0–52.0)
Hemoglobin: 13.2 g/dL (ref 13.0–17.0)
MCH: 32.1 pg (ref 26.0–34.0)
MCHC: 32.2 g/dL (ref 30.0–36.0)
MCV: 99.8 fL (ref 80.0–100.0)
Platelets: 166 10*3/uL (ref 150–400)
RBC: 4.11 MIL/uL — ABNORMAL LOW (ref 4.22–5.81)
RDW: 13.2 % (ref 11.5–15.5)
WBC: 13.4 10*3/uL — ABNORMAL HIGH (ref 4.0–10.5)
nRBC: 0 % (ref 0.0–0.2)

## 2018-06-17 LAB — BASIC METABOLIC PANEL
Anion gap: 8 (ref 5–15)
BUN: 28 mg/dL — ABNORMAL HIGH (ref 8–23)
CO2: 20 mmol/L — ABNORMAL LOW (ref 22–32)
Calcium: 8 mg/dL — ABNORMAL LOW (ref 8.9–10.3)
Chloride: 108 mmol/L (ref 98–111)
Creatinine, Ser: 1.46 mg/dL — ABNORMAL HIGH (ref 0.61–1.24)
GFR calc Af Amer: 52 mL/min — ABNORMAL LOW (ref >60)
GFR calc non Af Amer: 45 mL/min — ABNORMAL LOW (ref >60)
Glucose, Bld: 87 mg/dL (ref 70–99)
Potassium: 3.4 mmol/L — ABNORMAL LOW (ref 3.5–5.1)
Sodium: 136 mmol/L (ref 135–145)

## 2018-06-17 LAB — PHOSPHORUS: Phosphorus: 2.5 mg/dL (ref 2.5–4.6)

## 2018-06-17 LAB — MAGNESIUM: Magnesium: 2 mg/dL (ref 1.7–2.4)

## 2018-06-17 MED ORDER — VANCOMYCIN HCL IN DEXTROSE 1-5 GM/200ML-% IV SOLN
1000.0000 mg | INTRAVENOUS | Status: DC
  Administered 2018-06-17: 22:00:00 1000 mg via INTRAVENOUS
  Filled 2018-06-17: qty 200

## 2018-06-17 MED ORDER — CARBIDOPA-LEVODOPA 25-100 MG PO TABS
1.5000 | ORAL_TABLET | ORAL | Status: DC
  Administered 2018-06-17 – 2018-06-18 (×2): 1.5 via ORAL
  Filled 2018-06-17 (×2): qty 2

## 2018-06-17 MED ORDER — POTASSIUM CHLORIDE CRYS ER 20 MEQ PO TBCR
40.0000 meq | EXTENDED_RELEASE_TABLET | Freq: Two times a day (BID) | ORAL | Status: AC
  Administered 2018-06-17 (×2): 40 meq via ORAL
  Filled 2018-06-17 (×2): qty 2

## 2018-06-17 MED ORDER — LORAZEPAM 2 MG/ML IJ SOLN
1.0000 mg | Freq: Once | INTRAMUSCULAR | Status: AC
  Administered 2018-06-18: 1 mg via INTRAVENOUS
  Filled 2018-06-17: qty 1

## 2018-06-17 MED ORDER — SODIUM CHLORIDE 0.9 % IV SOLN
INTRAVENOUS | Status: AC
  Administered 2018-06-17: 11:00:00 via INTRAVENOUS

## 2018-06-17 MED ORDER — SODIUM CHLORIDE 0.9 % IV SOLN: INTRAVENOUS | Status: DC

## 2018-06-17 NOTE — Progress Notes (Addendum)
PROGRESS NOTE    Henry Adams  ZOX:[id number] DOB: 1940/12/07 DOA: 06/16/2018 PCP: [name], MD   Brief Narrative:  HPI per Dr. Ivor Costa on 06/16/2018 HPI: Henry Adams is a 78 y.o. male with medical history significant of parkinson's, memory issues, polycystic kidneys, BPH, GERD, depression with anxiety, CKD-3, who presents with fever and altered mental status.  Per ED physician, patient's daughter reported that the patient has been confused than his baseline in the past several days.  He has fever of 101.3 at home last night. Pt was seen in urgent care later in the day. He had a urine dipstick and a chest x-ray which were negative at that time. Patient's daughter reported to EDP that a couple of days ago they were at the urologist office and they did a bladder scan that showed up a lot of urine in there however they were unable to cath him. She is concerned that pt may have urinary retention and infection. Pt was found to have postvoid residual volume of 500 cc in ED. Patient denies dysuria or burning on urination.  Patient does not have chest pain, no active respiratory distress or cough.  No active nausea, vomiting or diarrhea noted.  Patient moves all extremities.  ED Course: pt was found to have WBC 15.3, positive urinalysis (hazy appearance, large amount of leukocyte, rare bacteria, WBC>50), lactic acid 0.9, alcohol level less than 10, ammonia 23, stable renal function, temperature 98.9, heart rate 70 to 90s, oxygen saturation 95% on room air.  Chest x-ray negative.  CT head is negative for acute intracranial abnormalities, but showed a stable subcentimeter meningioma in the posterior foramen magnum region without mass effect or edema. CT of  renal stone study is a limited study, but is negative for acute findings except for known polycystic kidney disease. Pt is placed on telemetry bed for observation.  **Interim History The patient was restarted on IV fluid hydration with normal  saline at a rate of 75 mL's per hour and will obtain a SLP consultation during the patient's recent AMS.  Patient is more alert today however we will ensure that it is safe for him to swallow prior to initiating diet.  No function is trending down and Foley catheter has been placed.  Will obtain PT/OT evaluations as well.  Urine cultures growing enterococcus so IV antibiotics will be changed from the ceftriaxone to IV Vancomycin.   Assessment & Plan:   Principal Problem:   UTI (urinary tract infection) Active Problems:   Essential hypertension   G E R D   Polycystic kidney   BPH associated with nocturia   Anxiety and depression   CRI (chronic renal insufficiency), stage 3 (moderate) (HCC)   Acute metabolic encephalopathy   Parkinsonism (HCC)  Enterococcus UTI (urinary tract infection), poA -In the setting of Acute Urinary Retention -CT of Renal Stone study is a limited study, but is negative for acute findings except for known polycystic kidney disease.   -Patient had a Leukocytosis of 15.3 and this has now trended down to 13.5, afebrile.   -Does not meet criteria for sepsis currently. -Started on Ceftriaxone by IV but will change to IV Vancomycin given >100,000 CFU of Enterococcus Faecalis  -Patient received 1 dose of Vancomycin cefepime and Flagyl in ED) -Follow up results of urine and blood cx and will amend antibiotic regimen per sensitivity results -Urinalysis showed hazy appearance, 5 ketones, large leukocytes, negative nitrites, rare bacteria, 0-5 9 squamous epithelial cells, greater  than 50 WBCs,  -Repeat Urine Cx pending -Given 1 Liter of NS in the ED and started on Maintenance IVF with NS at 75 mL/hr -Foley Catheter Placed -Continue to Monitor and repeat CBC in AM   BPH associated with nocturia and Acute Urinary Retention -Foley cath placement  -Will check Renal U/S in AM  -Will need Urology follow up eventually   Essential Hypertension  -BP was 138/84 on admission and  is now 147/92 -C/w IV Hydralazine 5 mg q2hprn for SBP>175  GERD -C/w Pantoprazole 40 mg po Daily   Depression and Anxiety  -Continue with Home Buproprion 150 mg po Daily and Clonazepam 0.5 mg po qHS   Hx of Polycystic Kidney AKI on CKD stage 3 -Improving. Baseline creatinine 1.5-1.8.   -BUN/Cr went from 31/1.85 -> 34/1.78 -> 28/1.46 -Avoid Nephrotoxic Medications if possible, Contrast Dyes, and Hypotension -Started back IVF Hydration with NS at 75 mL/hr x 1 Day -Continue to trend and Monitor Renal Fxn -Repeat CMP in AM  Parkinsonism -C/w Home Carbidopa-Levodopa 25-100 mg 1 tab po TID and 1.5 tab po q24h  -PT/OT to Evaluate and Treat  Acute Toxic Metabolic Encephalopathy  -More Confusion than at baseline but this is improving  -Likely due to UTI.  -CT head is negative for acute intracranial abnormalities.   -No focal neurologic findings on physical examination. -Continue Frequent neuro check -Will keep patient NPO until mental status improves and obtain SLP evaluation -Delirium Precautions -Continue to Monitor Closely   Hypokalemia -Patient's K+ this AM was 3.4 -Replete with po KCl 40 mEQ BID x2 Doses -Mag Level was 2.0 -Continue to Monitor and Replete as Necessary -Repeat CMP in AM   DVT prophylaxis: Heparin 5,000 units sq q8h Code Status: FULL CODE Family Communication: No family present at bedside but I discussed with daughter over the phone Disposition Plan: Obtain PT/OT Evaluation and continue to Treat UTI and follow up on Cultures   Consultants:   None   Procedures:  Foley Placement   Antimicrobials:  Anti-infectives (From admission, onward)   Start     Dose/Rate Route Frequency Ordered Stop   06/17/18 0800  cefTRIAXone (ROCEPHIN) 1 g in sodium chloride 0.9 % 100 mL IVPB     1 g 200 mL/hr over 30 Minutes Intravenous Every 24 hours 06/16/18 2331     06/16/18 2130  ceFEPIme (MAXIPIME) 2 g in sodium chloride 0.9 % 100 mL IVPB     2 g 200 mL/hr  over 30 Minutes Intravenous  Once 06/16/18 2126 06/16/18 2238   06/16/18 2130  metroNIDAZOLE (FLAGYL) IVPB 500 mg     500 mg 100 mL/hr over 60 Minutes Intravenous  Once 06/16/18 2126 06/16/18 2315   06/16/18 2130  vancomycin (VANCOCIN) IVPB 1000 mg/200 mL premix     1,000 mg 200 mL/hr over 60 Minutes Intravenous  Once 06/16/18 2126 06/17/18 0030     Subjective: At bedside and states that he is doing okay was wanting to eat something.  Denied any chest pain, lightheadedness or dizziness.  No nausea or vomiting and feels that he is doing a little bit better since having a Foley catheter placed.  He is awake and he is alert and oriented x2 but is unable to tell me the president's name.  No other concerns or complaints at this time.  Objective: Vitals:   06/17/18 0020 06/17/18 0056 06/17/18 0100 06/17/18 0627  BP: (!) 146/86 (!) 155/95  (!) 147/92  Pulse: 78 77  83  Resp: 18  12  16  Temp:  98.1 F (36.7 C)  98.7 F (37.1 C)  TempSrc:  Oral  Oral  SpO2: 96% 100%  97%  Weight:   72.4 kg   Height:   5\' 11"  (1.803 m)     Intake/Output Summary (Last 24 hours) at 06/17/2018 0959 Last data filed at 06/17/2018 3220 Gross per 24 hour  Intake 1400 ml  Output 1573 ml  Net -173 ml   Filed Weights   06/17/18 0100  Weight: 72.4 kg   Examination: Physical Exam:  Constitutional: Thin Caucasian Male in NAD and appears calm but looks uncomfortable Eyes: Lids and conjunctivae normal, sclerae anicteric  ENMT: External Ears, Nose appear normal. Grossly normal hearing.  Neck: Appears normal, supple, no cervical masses, normal ROM, no appreciable thyromegaly; no JVD Respiratory: Diminished to auscultation bilaterally, no wheezing, rales, rhonchi or crackles. Normal respiratory effort and patient is not tachypenic. No accessory muscle use.  Cardiovascular: RRR, no murmurs / rubs / gallops. S1 and S2 auscultated.  Abdomen: Soft, non-tender, non-distended. No masses palpated. No appreciable  hepatosplenomegaly. Bowel sounds positive x4.  GU: Deferred. Foley Catheter is in place Musculoskeletal: No clubbing / cyanosis of digits/nails. No joint deformity upper and lower extremities.  Skin: No rashes, lesions, ulcers on a limited skin evaluation. No induration; Warm and dry.  Neurologic: CN 2-12 grossly intact with no focal deficits. Romberg sign and cerebellar reflexes not assessed.  Psychiatric: Normal judgment and insight. Alert and oriented x 2. Normal mood and appropriate affect.   Data Reviewed: I have personally reviewed following labs and imaging studies  CBC: Recent Labs  Lab 06/16/18 0957 06/16/18 2050 06/17/18 0548  WBC 15.5* 15.3* 13.4*  NEUTROABS  --  12.2*  --   HGB 13.7 13.2 13.2  HCT 41.8 40.4 41.0  MCV 96.5 98.5 99.8  PLT 179 176 254   Basic Metabolic Panel: Recent Labs  Lab 06/16/18 0957 06/16/18 2050 06/17/18 0548  NA 137 136 136  K 4.1 3.5 3.4*  CL 103 105 108  CO2 26 22 20*  GLUCOSE 100* 104* 87  BUN 31* 34* 28*  CREATININE 1.85* 1.78* 1.46*  CALCIUM 8.6* 8.4* 8.0*  MG  --   --  2.0  PHOS  --   --  2.5   GFR: Estimated Creatinine Clearance: 41.3 mL/min (A) (by C-G formula based on SCr of 1.46 mg/dL (H)). Liver Function Tests: Recent Labs  Lab 06/16/18 0957 06/16/18 2050  AST 16 17  ALT 11 6  ALKPHOS 88 86  BILITOT 1.8* 1.2  PROT 7.3 7.5  ALBUMIN 3.5 3.7   No results for input(s): LIPASE, AMYLASE in the last 168 hours. Recent Labs  Lab 06/16/18 2050  AMMONIA 23   Coagulation Profile: No results for input(s): INR, PROTIME in the last 168 hours. Cardiac Enzymes: No results for input(s): CKTOTAL, CKMB, CKMBINDEX, TROPONINI in the last 168 hours. BNP (last 3 results) No results for input(s): PROBNP in the last 8760 hours. HbA1C: No results for input(s): HGBA1C in the last 72 hours. CBG: Recent Labs  Lab 06/16/18 2059  GLUCAP 89   Lipid Profile: No results for input(s): CHOL, HDL, LDLCALC, TRIG, CHOLHDL, LDLDIRECT in  the last 72 hours. Thyroid Function Tests: No results for input(s): TSH, T4TOTAL, FREET4, T3FREE, THYROIDAB in the last 72 hours. Anemia Panel: No results for input(s): VITAMINB12, FOLATE, FERRITIN, TIBC, IRON, RETICCTPCT in the last 72 hours. Sepsis Labs: Recent Labs  Lab 06/16/18 2050  LATICACIDVEN 0.9  Recent Results (from the past 240 hour(s))  Urine culture     Status: Abnormal (Preliminary result)   Collection Time: 06/16/18  9:57 AM  Result Value Ref Range Status   Specimen Description URINE, CLEAN CATCH  Final   Special Requests NONE  Final   Culture (A)  Final    >=100,000 COLONIES/mL ENTEROCOCCUS FAECALIS SUSCEPTIBILITIES TO FOLLOW Performed at [facility], [address] [place], [place] [address]    Report Status PENDING  Incomplete  SARS Coronavirus 2 (CEPHEID - Performed in [place] hospital lab), Hosp Order     Status: None   Collection Time: 06/16/18  9:01 PM  Result Value Ref Range Status   SARS Coronavirus 2 NEGATIVE NEGATIVE Final    Comment: (NOTE) If result is NEGATIVE SARS-CoV-2 target nucleic acids are NOT DETECTED. The SARS-CoV-2 RNA is generally detectable in upper and lower  respiratory specimens during the acute phase of infection. The lowest  concentration of SARS-CoV-2 viral copies this assay can detect is 250  copies / mL. A negative result does not preclude SARS-CoV-2 infection  and should not be used as the sole basis for treatment or other  patient management decisions.  A negative result may occur with  improper specimen collection / handling, submission of specimen other  than nasopharyngeal swab, presence of viral mutation(s) within the  areas targeted by this assay, and inadequate number of viral copies  (<250 copies / mL). A negative result must be combined with clinical  observations, patient history, and epidemiological information. If result is POSITIVE SARS-CoV-2 target nucleic acids are DETECTED. The SARS-CoV-2 RNA is  generally detectable in upper and lower  respiratory specimens dur ing the acute phase of infection.  Positive  results are indicative of active infection with SARS-CoV-2.  Clinical  correlation with patient history and other diagnostic information is  necessary to determine patient infection status.  Positive results do  not rule out bacterial infection or co-infection with other viruses. If result is PRESUMPTIVE POSTIVE SARS-CoV-2 nucleic acids MAY BE PRESENT.   A presumptive positive result was obtained on the submitted specimen  and confirmed on repeat testing.  While 2019 novel coronavirus  (SARS-CoV-2) nucleic acids may be present in the submitted sample  additional confirmatory testing may be necessary for epidemiological  and / or clinical management purposes  to differentiate between  SARS-CoV-2 and other Sarbecovirus currently known to infect humans.  If clinically indicated additional testing with an alternate test  methodology [phone]) is advised. The SARS-CoV-2 RNA is generally  detectable in upper and lower respiratory sp ecimens during the acute  phase of infection. The expected result is Negative. Fact Sheet for Patients:  StrictlyIdeas.no Fact Sheet for Healthcare Providers: BankingDealers.co.za This test is not yet approved or cleared by the [place] FDA and has been authorized for detection and/or diagnosis of SARS-CoV-2 by FDA under an Emergency Use Authorization (EUA).  This EUA will remain in effect (meaning this test can be used) for the duration of the COVID-19 declaration under Section 564(b)(1) of the Act, 21 U.S.C. section 360bbb-3(b)(1), unless the authorization is terminated or revoked sooner. Performed at [facility], [place] [address] [place], [place] [address]     Radiology Studies: Dg Chest 2 View  Result Date: 06/16/2018 CLINICAL DATA:  Fever for 1 day. EXAM: CHEST - 2 VIEW  COMPARISON:  Chest x-ray dated 03/27/2018 FINDINGS: Heart size and mediastinal contours are within normal limits. Lungs are hyperexpanded. Lungs are clear.  No pleural effusion seen. No acute or suspicious osseous finding. IMPRESSION: 1. No active cardiopulmonary disease. No evidence of pneumonia or pulmonary edema. 2. Hyperexpanded lungs indicating COPD. Electronically Signed   By: Franki Cabot M.D.   On: 06/16/2018 10:24   Ct Head Wo Contrast  Result Date: 06/16/2018 CLINICAL DATA:  Fever and altered mental status EXAM: CT HEAD WITHOUT CONTRAST TECHNIQUE: Contiguous axial images were obtained from the base of the skull through the vertex without intravenous contrast. COMPARISON:  Head CT August 17, 2017 and brain MRI December 11, 2017; earlier brain MRI April 25, 2015 FINDINGS: Brain: Mild diffuse atrophy is stable. There is a stable meningioma at the posterior foramen magnum level measuring 8 x 8 mm. This small meningioma does not show evident mass effect or edema. No other mass is evident. There is no hemorrhage, extra-axial fluid collection, or midline shift. There is patchy small vessel disease in the centra semiovale bilaterally. There is no evident acute infarct. Vascular: No hyperdense vessel. There is calcification in each carotid siphon region. Skull: The bony calvarium appears intact. Sinuses/Orbits: There is mucosal thickening in several ethmoid air cells. Other visualized paranasal sinuses are clear. Visualized orbits appear symmetric bilaterally. Other: Mastoid air cells are clear. IMPRESSION: Stable atrophy with periventricular small vessel disease. No evident acute infarct. Stable subcentimeter meningioma in the posterior foramen magnum region without mass effect or edema. No new mass evident. No hemorrhage. There are foci of arterial vascular calcification. There is mucosal thickening in several ethmoid air cells. Electronically Signed   By: Lowella Grip III M.D.   On: 06/16/2018 21:57   Dg  Chest Port 1 View  Result Date: 06/16/2018 CLINICAL DATA:  Fever EXAM: PORTABLE CHEST 1 VIEW COMPARISON:  06/16/2018 FINDINGS: The cardiac silhouette is stable. There is no pneumothorax. No large pleural effusion. No significant area of consolidation. The lungs are somewhat hyperexpanded. IMPRESSION: No active disease. Electronically Signed   By: Constance Holster M.D.   On: 06/16/2018 22:05   Ct Renal Stone Study  Result Date: 06/16/2018 CLINICAL DATA:  Fever.  Confusion. EXAM: CT ABDOMEN AND PELVIS WITHOUT CONTRAST TECHNIQUE: Multidetector CT imaging of the abdomen and pelvis was performed following the standard protocol without IV contrast. COMPARISON:  CT dated 07/01/2006. FINDINGS: Lower chest: No acute abnormality. Hepatobiliary: Innumerable cysts are noted in the liver. The gallbladder is unremarkable. Pancreas: The pancreas is suboptimally evaluated in the absence of IV contrast. There is no definite pancreatic mass. Spleen: Normal in size without focal abnormality. Adrenals/Urinary Tract: There are innumerable large cysts throughout both kidneys. Both kidneys are enlarged. There is some fullness of both collecting systems without evidence of an obstructing stone. The bladder is decompressed which limits evaluation. Stomach/Bowel: The bowel is suboptimally evaluated secondary to lack of both oral and IV contrast. Additionally much of the bowel is compressed secondary to the patient's enlarged kidneys. Given these limitations, no definite abnormality is detected. There is no small bowel obstruction. The appendix is not well identified. Vascular/Lymphatic: Aortic atherosclerosis. No enlarged abdominal or pelvic lymph nodes. Reproductive: Prostate gland is significantly enlarged. There is a trace amount of free fluid in the patient's pelvis. Other: No abdominal wall hernia or abnormality. No abdominopelvic ascites. Musculoskeletal: No acute or significant osseous findings. IMPRESSION: 1. Limited study as  detailed above. 2. No definite acute abnormality detected. 3. Again identified are findings of polycystic kidney disease. Electronically Signed   By: Constance Holster M.D.   On: 06/16/2018 23:18   Scheduled Meds:  buPROPion  150 mg Oral Daily   carbidopa-levodopa  1 tablet Oral 3 times per day   carbidopa-levodopa  1.5 tablet Oral Q24H   clonazePAM  0.5 mg Oral QHS   docusate sodium  100 mg Oral Daily   heparin  5,000 Units Subcutaneous Q8H   loratadine  10 mg Oral Daily   lubiprostone  24 mcg Oral BID WC   pantoprazole  40 mg Oral Daily   potassium chloride  40 mEq Oral BID   vitamin B-12  1,000 mcg Oral Daily   Continuous Infusions:  sodium chloride     cefTRIAXone (ROCEPHIN)  IV 1 g (06/17/18 0804)    LOS: 0 days   Kerney Elbe, DO [facility] PAGER is on [place]  If 7PM-7AM, please contact night-coverage [email or web address] Password [email or web address] 06/17/2018, 9:59 AM

## 2018-06-17 NOTE — Evaluation (Signed)
Clinical/Bedside Swallow Evaluation Patient Details  Name: Henry Adams MRN: [id number] Date of Birth: Aug 03, 1940  Today's Date: 06/17/2018 Time: SLP Start Time (ACUTE ONLY): 1627 SLP Stop Time (ACUTE ONLY): 1705 SLP Time Calculation (min) (ACUTE ONLY): 38 min  Past Medical History:  Past Medical History:  Diagnosis Date  . Allergy    perennial  . Anxiety   . Arthritis   . Cholelithiasis   . Chronic idiopathic constipation   . CKD (chronic kidney disease)   . Depression   . Diverticulosis   . Enlarged prostate   . Gallstones   . GERD (gastroesophageal reflux disease)   . HLD (hyperlipidemia)   . Hx of cardiovascular stress test    ETT-Myoview 6/14: probable low risk study, inf defect with normal inf wall motion-cannot r/o ischemia; prominent gut uptake adjacent to inf wall on rest images-poss defect is atten, EF 63%  . Hypertension   . Melanoma ([place])   . Parkinsonism ([place])   . Polycystic kidney disease    Past Surgical History:  Past Surgical History:  Procedure Laterality Date  . COLONOSCOPY  2011   diverticulosis  . HERNIA REPAIR     umbilical  . KNEE SURGERY Left   . medialization laryngoplasty  10/15/2013   Dr Joya Gaskins, [facility]  . TONSILLECTOMY AND ADENOIDECTOMY     HPI:  pt is an 78 yo male adm to [facility] with AMS, fever - concern for UTI.  Pt has h/o Parkinsonism, cognitive deficits, urinary tract infection, polycystic kidney disease, GERD, BPH, vocal cord atrophy, and has participted in PT at OP.  Swallow evaluation ordered.  Md concerned pt possibly was aspirating.   CXR negative for pna, does show hyperexpansion consistent with COPD - also noted emphysema on prior cxr.   Pt had surgery on his larynx, s/p medialization laryngoplasty per notes.  His voice is strong and clear today.     Assessment / Plan / Recommendation Clinical Impression  Pt is demonstrating soft signs of pharyngeal dysphagia with likely at least laryngeal penetration and possibly mild aspiration of  thin liquids.  Pt demonstrates subtle cough/throat clearing after intake of approximately 1/6 swallows.  He admits he has drink go down "the wrong way" at times.  Pt is able to self feed fortunately which provides neuro input for swallowing.  Pt admits to h/o occasional difficulty with liquids that has not worsened.  His CN exam is unremarkable x mild left eye ptosis.  Excellent tolerance of small bites of solids without indication of oropharyngeal residuals.  Pt's subtle cough is inconsistent but does not occur at baseline, without intake.     SLP phoned daughter Henry Adams and advised her to SLP suspicion of pt having some aspiration for which he has tolerated given negative chest imaging.  Daughter reports pt has issues coughing with pills at home and SLP discussed alternative means of swallowing pills - including using puree - whole.  Reviewed option of MBS to allow viewing of swallowing or continuing po with compensation strategies to mitigate his risk.  [name] declined for pt to have MBS at this time and desired to continue diet with precautions.    Provided written instructions for swallow compensation strategies and left with pt.  Advised [name] if pt has worsening dysphagia or recurrent lung infections, an OP MBS may be helpful.  Reviewed pH neutral status of water and items better tolerated if aspirated.   All questions answered.     Of note, daughter asking re: pt's CXR showing  COPD - pt DOES have h/o being exposed to 2nd hand smoke as his wife smoked for approx 10-15 years.  Advised her to speak to MD regarding this finding and concern.    SLP Visit Diagnosis: Dysphagia, pharyngeal phase (R13.13)    Aspiration Risk  Mild aspiration risk    Diet Recommendation Dysphagia 3 (Mech soft);Thin liquid   Liquid Administration via: Cup;Straw Medication Administration: Whole meds with puree Supervision: Patient able to self feed Compensations: Slow rate;Small sips/bites Postural Changes: Seated upright at 90  degrees;Remain upright for at least 30 minutes after po intake    Other  Recommendations Oral Care Recommendations: Oral care QID   Follow up Recommendations        Frequency and Duration min 1 x/week  1 week       Prognosis Prognosis for Safe Diet Advancement: Fair Barriers to Reach Goals: Cognitive deficits      Swallow Study   General Date of Onset: 06/17/18 HPI: pt is an 78 yo male adm to [facility] with AMS, fever - concern for UTI.  Pt has h/o Parkinsonism, cognitive deficits, urinary tract infection, polycystic kidney disease, GERD, BPH, vocal cord atrophy, and has participted in PT at OP.  Swallow evaluation ordered.  Md concerned pt possibly was aspirating.   CXR negative for pna, does show hyperexpansion consistent with COPD - also noted emphysema on prior cxr.    Type of Study: Bedside Swallow Evaluation Diet Prior to this Study: Thin liquids;Dysphagia 3 (soft) Temperature Spikes Noted: No Respiratory Status: Room air History of Recent Intubation: No Behavior/Cognition: Alert;Confused;Other (Comment)(pt benefited from encouragement to participate in evaluation and allow respositioning upright in bed) Oral Cavity Assessment: Within Functional Limits Oral Care Completed by SLP: No Oral Cavity - Dentition: Adequate natural dentition Vision: Functional for self-feeding Self-Feeding Abilities: Able to feed self Patient Positioning: Upright in bed Baseline Vocal Quality: Normal Volitional Cough: Cognitively unable to elicit Volitional Swallow: Unable to elicit    Oral/Motor/Sensory Function Overall Oral Motor/Sensory Function: Within functional limits(? decreased symmetry with left eye opening, ? left eye ptosis )   Ice Chips Ice chips: Not tested   Thin Liquid Thin Liquid: Impaired Presentation: Cup;Straw;Self Fed Pharyngeal  Phase Impairments: Cough - Immediate Other Comments: immediate subtle cough x1/6 swallows, pt admits at times he "chokes"    Nectar Thick Nectar Thick  Liquid: Not tested   Honey Thick Honey Thick Liquid: Not tested   Puree Puree: Not tested   Solid     Solid: Impaired Presentation: Self Fed Pharyngeal Phase Impairments: Throat Clearing - Delayed(throat clearing with large bites of fruit, suspect liquid penetration into larynx) Other Comments: fish, soup with crackers, fruit- pt takes large boluses stating "I'll be like a hillbilly and just take it" when SLP requested to cut up his large pieces of fruit      Macario Golds 06/17/2018,6:07 PM   Luanna Salk, [place] Pager [phone] Office [phone]

## 2018-06-17 NOTE — Progress Notes (Addendum)
Pharmacy Antibiotic Note  Henry Adams is a 78 y.o. male admitted on 06/16/2018 with fever and AMS. UCx growing enterococcus. Pharmacy has been consulted for vancomycin dosing.  Plan:  Vancomycin 1000 mg IV q24 hr (est AUC 501 based on SCr 1.46, Vd 0.72)  Measure vancomycin AUC at steady state as indicated   Height: 5\' 11"  (180.3 cm) Weight: 159 lb 9.8 oz (72.4 kg) IBW/kg (Calculated) : 75.3  Temp (24hrs), Avg:98.5 F (36.9 C), Min:98.1 F (36.7 C), Max:98.9 F (37.2 C)  Recent Labs  Lab 06/16/18 0957 06/16/18 2050 06/17/18 0548  WBC 15.5* 15.3* 13.4*  CREATININE 1.85* 1.78* 1.46*  LATICACIDVEN  --  0.9  --     Estimated Creatinine Clearance: 41.3 mL/min (A) (by C-G formula based on SCr of 1.46 mg/dL (H)).    Allergies  Allergen Reactions  . Iodine     rash  . Oxycodone     Mental status changes & nausea  . Pollen Extract Itching  . Tamiflu [Oseltamivir Phosphate] Other (See Comments)    hallucinations  . Tamsulosin     Other reaction(s): Other (See Comments) Unknown   . Doxazosin Rash    agitation  . Etodolac Rash    unknown  . Tape Rash    Certain "Band-Aids"      Thank you for allowing pharmacy to be a part of this patient's care.  Reuel Boom, PharmD, BCPS [phone] 06/17/2018, 10:38 AM

## 2018-06-17 NOTE — Progress Notes (Signed)
Received referral from SW - pt has questions about advance directives.    On chaplain visit, pt is pleasant, oriented to place, confused about history, time.   He recalls conversation about advance directives, but states he "already got this taken care of."    Chaplain will follow up with patient when mental status has cleared.  Please page spiritual care as needs arise.   Provided brief spiritual support around hospitalization, orientation to unit.   Jerene Pitch, MDiv, [facility]

## 2018-06-17 NOTE — ED Notes (Signed)
ED TO INPATIENT HANDOFF REPORT  Name/Age/Gender Henry Adams 78 y.o. male  Code Status    Code Status Orders  (From admission, onward)         Start     Ordered   06/16/18 2333  Full code  Continuous     06/16/18 2333        Code Status History    This patient has a current code status but no historical code status.      Home/SNF/Other Home  Chief Complaint Fever of 100.6   Level of Care/Admitting Diagnosis ED Disposition    ED Disposition Condition [facility] Area: [place] [[id number]]  Level of Care: Telemetry [5]  Admit to tele based on following criteria: Other see comments  Comments: AMS  Covid Evaluation: N/A  Diagnosis: UTI (urinary tract infection) [213086]  Admitting Physician: Ivor Costa [[id number]]  Attending Physician: Ivor Costa [[id number]]  PT Class (Do Not Modify): Observation [104]  PT Acc Code (Do Not Modify): Observation [10022]       Medical History Past Medical History:  Diagnosis Date  . Allergy    perennial  . Anxiety   . Arthritis   . Cholelithiasis   . Chronic idiopathic constipation   . CKD (chronic kidney disease)   . Depression   . Diverticulosis   . Enlarged prostate   . Gallstones   . GERD (gastroesophageal reflux disease)   . HLD (hyperlipidemia)   . Hx of cardiovascular stress test    ETT-Myoview 6/14: probable low risk study, inf defect with normal inf wall motion-cannot r/o ischemia; prominent gut uptake adjacent to inf wall on rest images-poss defect is atten, EF 63%  . Hypertension   . Melanoma ([place])   . Parkinsonism ([place])   . Polycystic kidney disease     Allergies Allergies  Allergen Reactions  . Iodine     rash  . Oxycodone     Mental status changes & nausea  . Pollen Extract Itching  . Tamiflu [Oseltamivir Phosphate] Other (See Comments)    hallucinations  . Tamsulosin     Other reaction(s): Other (See Comments) Unknown   . Doxazosin Rash    agitation  . Etodolac  Rash    unknown  . Tape Rash    Certain "Band-Aids"    IV Location/Drains/Wounds Patient Lines/Drains/Airways Status   Active Line/Drains/Airways    Name:   Placement date:   Placement time:   Site:   Days:   Peripheral IV 06/16/18 Right Forearm   06/16/18    2110    Forearm   1   Peripheral IV 06/16/18 Left Forearm   06/16/18    2203    Forearm   1   Urethral Catheter Katie, RN Coude 16 Fr.   06/16/18    2330    Coude   1          Labs/Imaging Results for orders placed or performed during the hospital encounter of 06/16/18 (from the past 48 hour(s))  CBC with Differential     Status: Abnormal   Collection Time: 06/16/18  8:50 PM  Result Value Ref Range   WBC 15.3 (H) 4.0 - 10.5 K/uL   RBC 4.10 (L) 4.22 - 5.81 MIL/uL   Hemoglobin 13.2 13.0 - 17.0 g/dL   HCT 40.4 39.0 - 52.0 %   MCV 98.5 80.0 - 100.0 fL   MCH 32.2 26.0 - 34.0 pg   MCHC 32.7 30.0 -  36.0 g/dL   RDW 13.2 11.5 - 15.5 %   Platelets 176 150 - 400 K/uL   nRBC 0.0 0.0 - 0.2 %   Neutrophils Relative % 80 %   Neutro Abs 12.2 (H) 1.7 - 7.7 K/uL   Lymphocytes Relative 9 %   Lymphs Abs 1.3 0.7 - 4.0 K/uL   Monocytes Relative 11 %   Monocytes Absolute 1.7 (H) 0.1 - 1.0 K/uL   Eosinophils Relative 0 %   Eosinophils Absolute 0.0 0.0 - 0.5 K/uL   Basophils Relative 0 %   Basophils Absolute 0.0 0.0 - 0.1 K/uL   Immature Granulocytes 0 %   Abs Immature Granulocytes 0.06 0.00 - 0.07 K/uL    Comment: Performed at [facility], [place] [address] [place], [place] [address]  Comprehensive metabolic panel     Status: Abnormal   Collection Time: 06/16/18  8:50 PM  Result Value Ref Range   Sodium 136 135 - 145 mmol/L   Potassium 3.5 3.5 - 5.1 mmol/L   Chloride 105 98 - 111 mmol/L   CO2 22 22 - 32 mmol/L   Glucose, Bld 104 (H) 70 - 99 mg/dL   BUN 34 (H) 8 - 23 mg/dL   Creatinine, Ser 1.78 (H) 0.61 - 1.24 mg/dL   Calcium 8.4 (L) 8.9 - 10.3 mg/dL   Total Protein 7.5 6.5 - 8.1 g/dL   Albumin 3.7 3.5 - 5.0  g/dL   AST 17 15 - 41 U/L   ALT 6 0 - 44 U/L   Alkaline Phosphatase 86 38 - 126 U/L   Total Bilirubin 1.2 0.3 - 1.2 mg/dL   GFR calc non Af Amer 35 (L) >60 mL/min   GFR calc Af Amer 41 (L) >60 mL/min   Anion gap 9 5 - 15    Comment: Performed at [facility], [place] [address] [place], [place] [address]  Urinalysis, Routine w reflex microscopic     Status: Abnormal   Collection Time: 06/16/18  8:50 PM  Result Value Ref Range   Color, Urine YELLOW YELLOW   APPearance HAZY (A) CLEAR   Specific Gravity, Urine 1.024 1.005 - 1.030   pH 5.0 5.0 - 8.0   Glucose, UA NEGATIVE NEGATIVE mg/dL   Hgb urine dipstick NEGATIVE NEGATIVE   Bilirubin Urine NEGATIVE NEGATIVE   Ketones, ur 5 (A) NEGATIVE mg/dL   Protein, ur 100 (A) NEGATIVE mg/dL   Nitrite NEGATIVE NEGATIVE   Leukocytes,Ua LARGE (A) NEGATIVE   RBC / HPF 6-10 0 - 5 RBC/hpf   WBC, UA >50 (H) 0 - 5 WBC/hpf   Bacteria, UA RARE (A) NONE SEEN   Squamous Epithelial / LPF 0-5 0 - 5   Mucus PRESENT    Hyaline Casts, UA PRESENT    Amorphous Crystal PRESENT    Non Squamous Epithelial 0-5 (A) NONE SEEN    Comment: Performed at [facility], [place] [address], [place], [place] [address]  Lactic acid, plasma     Status: None   Collection Time: 06/16/18  8:50 PM  Result Value Ref Range   Lactic Acid, Venous 0.9 0.5 - 1.9 mmol/L    Comment: Performed at [facility], [place] [address], [place], [place] [address]  Ammonia     Status: None   Collection Time: 06/16/18  8:50 PM  Result Value Ref Range   Ammonia 23 9 - 35 umol/L    Comment: Performed at [facility], [address] [place], [place] [address]  Rapid urine drug screen (hospital performed)     Status: None   Collection Time: 06/16/18  8:50 PM  Result Value Ref Range   Opiates NONE DETECTED NONE DETECTED   Cocaine NONE DETECTED NONE DETECTED   Benzodiazepines NONE DETECTED NONE DETECTED   Amphetamines NONE  DETECTED NONE DETECTED   Tetrahydrocannabinol NONE DETECTED NONE DETECTED   Barbiturates NONE DETECTED NONE DETECTED    Comment: (NOTE) DRUG SCREEN FOR MEDICAL PURPOSES ONLY.  IF CONFIRMATION IS NEEDED FOR ANY PURPOSE, NOTIFY LAB WITHIN 5 DAYS. LOWEST DETECTABLE LIMITS FOR URINE DRUG SCREEN Drug Class                     Cutoff (ng/mL) Amphetamine and metabolites    1000 Barbiturate and metabolites    200 Benzodiazepine                 604 Tricyclics and metabolites     300 Opiates and metabolites        300 Cocaine and metabolites        300 THC                            50 Performed at [facility], [place] [address] [place], [place] [address]   Ethanol     Status: None   Collection Time: 06/16/18  8:50 PM  Result Value Ref Range   Alcohol, Ethyl (B) <10 <10 mg/dL    Comment: (NOTE) Lowest detectable limit for serum alcohol is 10 mg/dL. For medical purposes only. Performed at [facility], [place] [address], [place], [place] [address]   CBG monitoring, ED     Status: None   Collection Time: 06/16/18  8:59 PM  Result Value Ref Range   Glucose-Capillary 89 70 - 99 mg/dL  SARS Coronavirus 2 (CEPHEID - Performed in [facility] hospital lab), Hosp Order     Status: None   Collection Time: 06/16/18  9:01 PM  Result Value Ref Range   SARS Coronavirus 2 NEGATIVE NEGATIVE    Comment: (NOTE) If result is NEGATIVE SARS-CoV-2 target nucleic acids are NOT DETECTED. The SARS-CoV-2 RNA is generally detectable in upper and lower  respiratory specimens during the acute phase of infection. The lowest  concentration of SARS-CoV-2 viral copies this assay can detect is 250  copies / mL. A negative result does not preclude SARS-CoV-2 infection  and should not be used as the sole basis for treatment or other  patient management decisions.  A negative result may occur with  improper specimen collection / handling, submission of specimen other  than  nasopharyngeal swab, presence of viral mutation(s) within the  areas targeted by this assay, and inadequate number of viral copies  (<250 copies / mL). A negative result must be combined with clinical  observations, patient history, and epidemiological information. If result is POSITIVE SARS-CoV-2 target nucleic acids are DETECTED. The SARS-CoV-2 RNA is generally detectable in upper and lower  respiratory specimens dur ing the acute phase of infection.  Positive  results are indicative of active infection with SARS-CoV-2.  Clinical  correlation with patient history and other diagnostic information is  necessary to determine patient infection status.  Positive results do  not rule out bacterial infection or co-infection with other viruses. If result is PRESUMPTIVE POSTIVE SARS-CoV-2 nucleic acids MAY BE PRESENT.   A presumptive positive result was obtained  on the submitted specimen  and confirmed on repeat testing.  While 2019 novel coronavirus  (SARS-CoV-2) nucleic acids may be present in the submitted sample  additional confirmatory testing may be necessary for epidemiological  and / or clinical management purposes  to differentiate between  SARS-CoV-2 and other Sarbecovirus currently known to infect humans.  If clinically indicated additional testing with an alternate test  methodology [phone]) is advised. The SARS-CoV-2 RNA is generally  detectable in upper and lower respiratory sp ecimens during the acute  phase of infection. The expected result is Negative. Fact Sheet for Patients:  StrictlyIdeas.no Fact Sheet for Healthcare Providers: BankingDealers.co.za This test is not yet approved or cleared by the [place] FDA and has been authorized for detection and/or diagnosis of SARS-CoV-2 by FDA under an Emergency Use Authorization (EUA).  This EUA will remain in effect (meaning this test can be used) for the duration of  the COVID-19 declaration under Section 564(b)(1) of the Act, 21 U.S.C. section 360bbb-3(b)(1), unless the authorization is terminated or revoked sooner. Performed at [facility], [place] [address], [place], [place] [address]    Dg Chest 2 View  Result Date: 06/16/2018 CLINICAL DATA:  Fever for 1 day. EXAM: CHEST - 2 VIEW COMPARISON:  Chest x-ray dated 03/27/2018 FINDINGS: Heart size and mediastinal contours are within normal limits. Lungs are hyperexpanded. Lungs are clear. No pleural effusion seen. No acute or suspicious osseous finding. IMPRESSION: 1. No active cardiopulmonary disease. No evidence of pneumonia or pulmonary edema. 2. Hyperexpanded lungs indicating COPD. Electronically Signed   By: Franki Cabot M.D.   On: 06/16/2018 10:24   Ct Head Wo Contrast  Result Date: 06/16/2018 CLINICAL DATA:  Fever and altered mental status EXAM: CT HEAD WITHOUT CONTRAST TECHNIQUE: Contiguous axial images were obtained from the base of the skull through the vertex without intravenous contrast. COMPARISON:  Head CT August 17, 2017 and brain MRI December 11, 2017; earlier brain MRI April 25, 2015 FINDINGS: Brain: Mild diffuse atrophy is stable. There is a stable meningioma at the posterior foramen magnum level measuring 8 x 8 mm. This small meningioma does not show evident mass effect or edema. No other mass is evident. There is no hemorrhage, extra-axial fluid collection, or midline shift. There is patchy small vessel disease in the centra semiovale bilaterally. There is no evident acute infarct. Vascular: No hyperdense vessel. There is calcification in each carotid siphon region. Skull: The bony calvarium appears intact. Sinuses/Orbits: There is mucosal thickening in several ethmoid air cells. Other visualized paranasal sinuses are clear. Visualized orbits appear symmetric bilaterally. Other: Mastoid air cells are clear. IMPRESSION: Stable atrophy with periventricular small vessel disease. No  evident acute infarct. Stable subcentimeter meningioma in the posterior foramen magnum region without mass effect or edema. No new mass evident. No hemorrhage. There are foci of arterial vascular calcification. There is mucosal thickening in several ethmoid air cells. Electronically Signed   By: Lowella Grip III M.D.   On: 06/16/2018 21:57   Dg Chest Port 1 View  Result Date: 06/16/2018 CLINICAL DATA:  Fever EXAM: PORTABLE CHEST 1 VIEW COMPARISON:  06/16/2018 FINDINGS: The cardiac silhouette is stable. There is no pneumothorax. No large pleural effusion. No significant area of consolidation. The lungs are somewhat hyperexpanded. IMPRESSION: No active disease. Electronically Signed   By: Constance Holster M.D.   On: 06/16/2018 22:05   Ct Renal Stone Study  Result Date: 06/16/2018 CLINICAL DATA:  Fever.  Confusion. EXAM: CT ABDOMEN AND PELVIS  WITHOUT CONTRAST TECHNIQUE: Multidetector CT imaging of the abdomen and pelvis was performed following the standard protocol without IV contrast. COMPARISON:  CT dated 07/01/2006. FINDINGS: Lower chest: No acute abnormality. Hepatobiliary: Innumerable cysts are noted in the liver. The gallbladder is unremarkable. Pancreas: The pancreas is suboptimally evaluated in the absence of IV contrast. There is no definite pancreatic mass. Spleen: Normal in size without focal abnormality. Adrenals/Urinary Tract: There are innumerable large cysts throughout both kidneys. Both kidneys are enlarged. There is some fullness of both collecting systems without evidence of an obstructing stone. The bladder is decompressed which limits evaluation. Stomach/Bowel: The bowel is suboptimally evaluated secondary to lack of both oral and IV contrast. Additionally much of the bowel is compressed secondary to the patient's enlarged kidneys. Given these limitations, no definite abnormality is detected. There is no small bowel obstruction. The appendix is not well identified. Vascular/Lymphatic:  Aortic atherosclerosis. No enlarged abdominal or pelvic lymph nodes. Reproductive: Prostate gland is significantly enlarged. There is a trace amount of free fluid in the patient's pelvis. Other: No abdominal wall hernia or abnormality. No abdominopelvic ascites. Musculoskeletal: No acute or significant osseous findings. IMPRESSION: 1. Limited study as detailed above. 2. No definite acute abnormality detected. 3. Again identified are findings of polycystic kidney disease. Electronically Signed   By: Constance Holster M.D.   On: 06/16/2018 23:18    Pending Labs Unresulted Labs (From admission, onward)    Start     Ordered   06/17/18 1601  Basic metabolic panel  Tomorrow morning,   R     06/16/18 2333   06/17/18 0500  CBC  Tomorrow morning,   R     06/16/18 2333   06/16/18 2221  Urine Culture  ONCE - STAT,   STAT     06/16/18 2220   06/16/18 2126  Blood Culture (routine x 2)  BLOOD CULTURE X 2,   STAT     06/16/18 2126          Vitals/Pain Today's Vitals   06/16/18 2352 06/16/18 2358 06/17/18 0000 06/17/18 0020  BP:   (!) 149/91 (!) 146/86  Pulse: 79 74 77 78  Resp: 17 19 18 18   Temp:      TempSrc:      SpO2: 97% 97% 98% 96%  PainSc:        Isolation Precautions No active isolations  Medications Medications  vancomycin (VANCOCIN) IVPB 1000 mg/200 mL premix (1,000 mg Intravenous New Bag/Given 06/16/18 2328)  cefTRIAXone (ROCEPHIN) 1 g in sodium chloride 0.9 % 100 mL IVPB (has no administration in time range)  heparin injection 5,000 Units (has no administration in time range)  acetaminophen (TYLENOL) tablet 650 mg (has no administration in time range)    Or  acetaminophen (TYLENOL) suppository 650 mg (has no administration in time range)  ondansetron (ZOFRAN) tablet 4 mg (has no administration in time range)    Or  ondansetron (ZOFRAN) injection 4 mg (has no administration in time range)  hydrALAZINE (APRESOLINE) injection 5 mg (has no administration in time range)   buPROPion (WELLBUTRIN XL) 24 hr tablet 150 mg (has no administration in time range)  docusate sodium (COLACE) capsule 100 mg (has no administration in time range)  lubiprostone (AMITIZA) capsule 24 mcg (has no administration in time range)  pantoprazole (PROTONIX) EC tablet 40 mg (has no administration in time range)  polyethylene glycol (MIRALAX / GLYCOLAX) packet 17 g (has no administration in time range)  vitamin B-12 (CYANOCOBALAMIN) tablet 1,000 mcg (  has no administration in time range)  carbidopa-levodopa (SINEMET IR) 25-100 MG per tablet immediate release 1-1.5 tablet (has no administration in time range)  clonazePAM (KLONOPIN) tablet 0.5 mg (has no administration in time range)  loratadine (CLARITIN) tablet 10 mg (has no administration in time range)  ceFEPIme (MAXIPIME) 2 g in sodium chloride 0.9 % 100 mL IVPB (0 g Intravenous Stopped 06/16/18 2238)  metroNIDAZOLE (FLAGYL) IVPB 500 mg (0 mg Intravenous Stopped 06/16/18 2315)  sodium chloride 0.9 % bolus 1,000 mL (0 mLs Intravenous Stopped 06/16/18 2330)    Mobility walks

## 2018-06-17 NOTE — ED Notes (Signed)
Spoke with Daughter [name], updated given and code status discussed.

## 2018-06-17 NOTE — Progress Notes (Signed)
New Admission Note:    Arrival Method: Bed Mental Orientation:  A & O X 3 Telemetry: Box 53 Assessment: complete Skin: dry, flaky Iv: Right FA, Left FA Pain: none Tubes: Foley in place Safety Measures: Safety Fall Prevention Plan has been given, discussed and signed Admission: Completed [facility] Orientation: Patient has been orientated to the room, unit, and staff.  Family: daughter via phone  Orders have been reviewed and implemented. Will continue to monitor the patient. Call light has been placed within reach and bed alarm has been activated.   Tawni Carnes, RN Phone number: [phone]

## 2018-06-17 NOTE — Telephone Encounter (Signed)
Patients daughter called team health on 5/24 at 7:46pm.  States father has a fever and he has polycystic kidney disease.  Temp was 100.0 oral (30 min prior to call).  No other  Fever noted for the day.  Always has abdominal pain but caller states is related to cystic disease.  No new or different pain.  More confusion noted than normal baseline.  Some mid lower back pain reported ambulatory at this time.   Patient was advised to go to the ED.

## 2018-06-17 NOTE — TOC Initial Note (Signed)
Transition of Care [facility]) - Initial/Assessment Note    Patient Details  Name: Henry Adams MRN: [id number] Date of Birth: [birth year]  Transition of Care [facility] CM/SW Contact:    Servando Snare, LCSW Phone Number: 06/17/2018, 1:32 PM  Clinical Narrative:          LCSW consulted for advanced directives and help in the home. LCSW notified chaplain of advanced directives request. LCSW attempted to complete assessment with patient. Patient is confused. LCSW contact patients daughter [name]. Patient lives with daughter. Per [name] patient is independent in ADLs. Patients daughter assist with patients meds. According to daughter she is unsure of any additional assistance that patient may need.   LCSW will continue to follow for disposition.           Expected Discharge Plan: Home/Self Care Barriers to Discharge: Continued Medical Work up   Patient Goals and CMS Choice Patient states their goals for this hospitalization and ongoing recovery are:: Go home   Choice offered to / list presented to : NA  Expected Discharge Plan and Services Expected Discharge Plan: Home/Self Care                                              Prior Living Arrangements/Services     Patient language and need for interpreter reviewed:: No        Need for Family Participation in Patient Care: Yes (Comment) Care giver support system in place?: Yes (comment)   Criminal Activity/Legal Involvement Pertinent to Current Situation/Hospitalization: No - Comment as needed  Activities of Daily Living Home Assistive Devices/Equipment: Hearing aid ADL Screening (condition at time of admission) Patient's cognitive ability adequate to safely complete daily activities?: Yes Is the patient deaf or have difficulty hearing?: Yes Does the patient have difficulty seeing, even when wearing glasses/contacts?: No Does the patient have difficulty concentrating, remembering, or making decisions?: Yes Patient able to express  need for assistance with ADLs?: Yes Does the patient have difficulty dressing or bathing?: Yes Independently performs ADLs?: Yes (appropriate for developmental age) Does the patient have difficulty walking or climbing stairs?: No Weakness of Legs: None Weakness of Arms/Hands: None  Permission Sought/Granted Permission sought to share information with : Family Supports Permission granted to share information with : Yes, Verbal Permission Granted  Share Information with NAME: [name]     Permission granted to share info w Relationship: Daughter     Emotional Assessment Appearance:: Appears stated age Attitude/Demeanor/Rapport: Unable to Assess Affect (typically observed): Calm Orientation: : Oriented to Self   Psych Involvement: No (comment)  Admission diagnosis:  Urinary retention [R33.9] Acute cystitis without hematuria [O27.74] Toxic metabolic encephalopathy [J28] Patient Active Problem List   Diagnosis Date Noted  . Parkinsonism ([place])   . Acute metabolic encephalopathy 78/67/6720  . UTI (urinary tract infection) 06/16/2018  . Toxic metabolic encephalopathy   . Urinary retention   . Confusion 03/27/2018  . Generalized abdominal tenderness without rebound tenderness 03/27/2018  . CRI (chronic renal insufficiency), stage 3 (moderate) ([place]) 03/27/2018  . Cough 03/25/2018  . Influenza A 03/25/2018  . Restless leg syndrome 01/23/2018  . Abdominal distension 12/25/2017  . Calculus of gallbladder with chronic cholecystitis without obstruction 09/18/2017  . Chronic idiopathic constipation 08/19/2017  . Age-related cognitive decline 01/09/2017  . Anxiety and depression 10/24/2016  . Insomnia 10/24/2016  . BPH associated with nocturia 08/11/2015  .  Routine general medical examination at a health care facility 08/11/2015  . Vocal cord atrophy 09/01/2013  . Essential hypertension 04/26/2007  . Allergic rhinitis 04/26/2007  . DEGENERATIVE JOINT DISEASE 04/26/2007  . G E R D  08/05/2006  . Helicobacter pylori infection 05/30/2006  . Hyperlipidemia with target LDL less than 160 05/30/2006  . Polycystic kidney 05/30/2006   PCP:  [name], MD Pharmacy:   [place], [place] [address] Phone: [phone] Fax: [phone]     Social Determinants of Health (SDOH) Interventions    Readmission Risk Interventions Readmission Risk Prevention Plan 06/17/2018  Transportation Screening Complete  Home Care Screening Complete  Some recent data might be hidden

## 2018-06-18 ENCOUNTER — Encounter: Payer: Self-pay | Admitting: Internal Medicine

## 2018-06-18 DIAGNOSIS — F419 Anxiety disorder, unspecified: Secondary | ICD-10-CM | POA: Diagnosis not present

## 2018-06-18 DIAGNOSIS — N3000 Acute cystitis without hematuria: Secondary | ICD-10-CM | POA: Diagnosis not present

## 2018-06-18 DIAGNOSIS — G9341 Metabolic encephalopathy: Secondary | ICD-10-CM | POA: Diagnosis not present

## 2018-06-18 DIAGNOSIS — N401 Enlarged prostate with lower urinary tract symptoms: Secondary | ICD-10-CM | POA: Diagnosis not present

## 2018-06-18 LAB — COMPREHENSIVE METABOLIC PANEL
ALT: 17 U/L (ref 0–44)
AST: 35 U/L (ref 15–41)
Albumin: 3.2 g/dL — ABNORMAL LOW (ref 3.5–5.0)
Alkaline Phosphatase: 92 U/L (ref 38–126)
Anion gap: 9 (ref 5–15)
BUN: 21 mg/dL (ref 8–23)
CO2: 21 mmol/L — ABNORMAL LOW (ref 22–32)
Calcium: 8.3 mg/dL — ABNORMAL LOW (ref 8.9–10.3)
Chloride: 106 mmol/L (ref 98–111)
Creatinine, Ser: 1.42 mg/dL — ABNORMAL HIGH (ref 0.61–1.24)
GFR calc Af Amer: 54 mL/min — ABNORMAL LOW (ref >60)
GFR calc non Af Amer: 46 mL/min — ABNORMAL LOW (ref >60)
Glucose, Bld: 86 mg/dL (ref 70–99)
Potassium: 4.2 mmol/L (ref 3.5–5.1)
Sodium: 136 mmol/L (ref 135–145)
Total Bilirubin: 1.2 mg/dL (ref 0.3–1.2)
Total Protein: 6.6 g/dL (ref 6.5–8.1)

## 2018-06-18 LAB — CBC WITH DIFFERENTIAL/PLATELET
Abs Immature Granulocytes: 0.06 10*3/uL (ref 0.00–0.07)
Basophils Absolute: 0.1 10*3/uL (ref 0.0–0.1)
Basophils Relative: 0 %
Eosinophils Absolute: 0.1 10*3/uL (ref 0.0–0.5)
Eosinophils Relative: 0 %
HCT: 46.2 % (ref 39.0–52.0)
Hemoglobin: 15.1 g/dL (ref 13.0–17.0)
Immature Granulocytes: 0 %
Lymphocytes Relative: 9 %
Lymphs Abs: 1.4 10*3/uL (ref 0.7–4.0)
MCH: 32.3 pg (ref 26.0–34.0)
MCHC: 32.7 g/dL (ref 30.0–36.0)
MCV: 98.9 fL (ref 80.0–100.0)
Monocytes Absolute: 1.6 10*3/uL — ABNORMAL HIGH (ref 0.1–1.0)
Monocytes Relative: 10 %
Neutro Abs: 13.2 10*3/uL — ABNORMAL HIGH (ref 1.7–7.7)
Neutrophils Relative %: 81 %
Platelets: 194 10*3/uL (ref 150–400)
RBC: 4.67 MIL/uL (ref 4.22–5.81)
RDW: 12.9 % (ref 11.5–15.5)
WBC: 16.4 10*3/uL — ABNORMAL HIGH (ref 4.0–10.5)
nRBC: 0 % (ref 0.0–0.2)

## 2018-06-18 LAB — CREATININE, SERUM
Creatinine, Ser: 1.38 mg/dL — ABNORMAL HIGH (ref 0.61–1.24)
GFR calc Af Amer: 56 mL/min — ABNORMAL LOW (ref >60)
GFR calc non Af Amer: 48 mL/min — ABNORMAL LOW (ref >60)

## 2018-06-18 LAB — URINE CULTURE
Culture: >=100000 — AB
Culture: 40000 — AB

## 2018-06-18 MED ORDER — AMOXICILLIN-POT CLAVULANATE 875-125 MG PO TABS
1.0000 | ORAL_TABLET | Freq: Two times a day (BID) | ORAL | Status: DC
  Administered 2018-06-18 – 2018-06-19 (×3): 1 via ORAL
  Filled 2018-06-18 (×3): qty 1

## 2018-06-18 MED ORDER — QUETIAPINE FUMARATE 25 MG PO TABS
25.0000 mg | ORAL_TABLET | Freq: Every day | ORAL | Status: DC
  Administered 2018-06-18: 25 mg via ORAL
  Filled 2018-06-18: qty 1

## 2018-06-18 MED ORDER — SODIUM CHLORIDE 0.9 % IV SOLN
INTRAVENOUS | Status: AC
  Administered 2018-06-18: 14:00:00 via INTRAVENOUS

## 2018-06-18 MED ORDER — HALOPERIDOL LACTATE 5 MG/ML IJ SOLN
2.0000 mg | Freq: Once | INTRAMUSCULAR | Status: AC
  Administered 2018-06-18: 2 mg via INTRAVENOUS
  Filled 2018-06-18: qty 1

## 2018-06-18 NOTE — Progress Notes (Signed)
PROGRESS NOTE    Henry Adams  SPQ:[id number] DOB: 1940-03-02 DOA: 06/16/2018 PCP: [name], MD   Brief Narrative:  Henry Adams a 78 y.o.malewith medical history significant ofparkinson's, memory issues, polycystic kidneys,BPH,GERD, depression with anxiety, CKD-3, who presents with fever and altered mental status.  PerED physician, patient's daughter reported that the patient has been confused than his baseline in the past several days.He has fever of 101.3 at home last night. Pt was seen inurgent care later in the day. He had a urine dipstick and a chest x-ray which were negative at that time. Patient's daughterreported to EDP thata couple of days ago they were at the urologist office and they did a bladder scan that showed up a lot of urine in there however they were unable to cath him. She is concerned thatptmay have urinary retention and infection. Pt was found to havepostvoid residual volume of 500cc inED. Patient denies dysuria or burning on urination. Patient does not have chest pain, no active respiratory distress or cough. No active nausea, vomiting or diarrhea noted. Patient moves all extremities.  ED Course:pt was found to have WBC 15.3, positive urinalysis (hazy appearance, large amount of leukocyte, rare bacteria, WBC>50), lactic acid0.9, alcohol level less than 10, ammonia 23, stable renal function, temperature 98.9, heart rate 70 to 90s, oxygen saturation 95% on room air. Chest x-ray negative. CT head is negative for acute intracranial abnormalities,but showeda stable subcentimeter meningioma in the posterior foramen magnum region without mass effect or edema.CT of renal stone study is alimited study, but is negative for acute findings except for knownpolycystic kidney disease.Pt isplaced on telemetry bed for observation.  Consultants:   None  Procedures:   None  Antimicrobials:   Vancomycin, starting today.  Starting on Augmentin  p.o. on 06/18/2018   Subjective: Patient seen and examined.  He has no complaints.  He is alert and oriented x3.  When I entered the room, he was already talking to his daughter Veverly Fells over the phone and I did talk to her on the phone right ear.  She also had her other sister [name] on the other phone.  Patient lives with [name].  They asked several questions.  My whole encounter was heard by them over the phone on speaker phone.  According to Amy, patient seems to be back to his baseline.  Objective: Vitals:   06/17/18 0627 06/17/18 1430 06/17/18 2329 06/17/18 2331  BP: (!) 147/92 (!) 144/80 (!) 157/98 (!) 155/95  Pulse: 83 80 95 92  Resp: 16 17 20    Temp: 98.7 F (37.1 C) 98.2 F (36.8 C) 98.1 F (36.7 C)   TempSrc: Oral Oral Oral   SpO2: 97% 98% 97% 97%  Weight:      Height:        Intake/Output Summary (Last 24 hours) at 06/18/2018 1132 Last data filed at 06/18/2018 1103 Gross per 24 hour  Intake 693.21 ml  Output 2100 ml  Net -1406.79 ml   Filed Weights   06/17/18 0100  Weight: 72.4 kg    Examination:  General exam: Appears calm and comfortable  Respiratory system: Clear to auscultation. Respiratory effort normal. Cardiovascular system: S1 & S2 heard, RRR. No JVD, murmurs, rubs, gallops or clicks. No pedal edema. Gastrointestinal system: Abdomen is nondistended, soft and nontender. No organomegaly or masses felt. Normal bowel sounds heard. Central nervous system: Alert and oriented x3. No focal neurological deficits. Extremities: Symmetric 5 x 5 power. Skin: No rashes, lesions or ulcers  Psychiatry: Judgement and insight appear poor. Mood & affect flat   Data Reviewed: I have personally reviewed following labs and imaging studies  CBC: Recent Labs  Lab 06/16/18 0957 06/16/18 2050 06/17/18 0548 06/18/18 0830  WBC 15.5* 15.3* 13.4* 16.4*  NEUTROABS  --  12.2*  --  13.2*  HGB 13.7 13.2 13.2 15.1  HCT 41.8 40.4 41.0 46.2  MCV 96.5 98.5 99.8 98.9  PLT 179 176  166 950   Basic Metabolic Panel: Recent Labs  Lab 06/16/18 0957 06/16/18 2050 06/17/18 0548 06/18/18 0831  NA 137 136 136  --   K 4.1 3.5 3.4*  --   CL 103 105 108  --   CO2 26 22 20*  --   GLUCOSE 100* 104* 87  --   BUN 31* 34* 28*  --   CREATININE 1.85* 1.78* 1.46* 1.38*  CALCIUM 8.6* 8.4* 8.0*  --   MG  --   --  2.0  --   PHOS  --   --  2.5  --    GFR: Estimated Creatinine Clearance: 43.7 mL/min (A) (by C-G formula based on SCr of 1.38 mg/dL (H)). Liver Function Tests: Recent Labs  Lab 06/16/18 0957 06/16/18 2050  AST 16 17  ALT 11 6  ALKPHOS 88 86  BILITOT 1.8* 1.2  PROT 7.3 7.5  ALBUMIN 3.5 3.7   No results for input(s): LIPASE, AMYLASE in the last 168 hours. Recent Labs  Lab 06/16/18 2050  AMMONIA 23   Coagulation Profile: No results for input(s): INR, PROTIME in the last 168 hours. Cardiac Enzymes: No results for input(s): CKTOTAL, CKMB, CKMBINDEX, TROPONINI in the last 168 hours. BNP (last 3 results) No results for input(s): PROBNP in the last 8760 hours. HbA1C: No results for input(s): HGBA1C in the last 72 hours. CBG: Recent Labs  Lab 06/16/18 2059  GLUCAP 89   Lipid Profile: No results for input(s): CHOL, HDL, LDLCALC, TRIG, CHOLHDL, LDLDIRECT in the last 72 hours. Thyroid Function Tests: No results for input(s): TSH, T4TOTAL, FREET4, T3FREE, THYROIDAB in the last 72 hours. Anemia Panel: No results for input(s): VITAMINB12, FOLATE, FERRITIN, TIBC, IRON, RETICCTPCT in the last 72 hours. Sepsis Labs: Recent Labs  Lab 06/16/18 2050  LATICACIDVEN 0.9    Recent Results (from the past 240 hour(s))  Urine culture     Status: Abnormal   Collection Time: 06/16/18  9:57 AM  Result Value Ref Range Status   Specimen Description URINE, CLEAN CATCH  Final   Special Requests   Final    NONE Performed at [facility], [address], [place], [place] [address]    Culture >=100,000 COLONIES/mL ENTEROCOCCUS FAECALIS (A)  Final   Report  Status 06/18/2018 FINAL  Final   Organism ID, Bacteria ENTEROCOCCUS FAECALIS (A)  Final      Susceptibility   Enterococcus faecalis - MIC*    AMPICILLIN <=2 SENSITIVE Sensitive     LEVOFLOXACIN 1 SENSITIVE Sensitive     NITROFURANTOIN <=16 SENSITIVE Sensitive     VANCOMYCIN 1 SENSITIVE Sensitive     * >=100,000 COLONIES/mL ENTEROCOCCUS FAECALIS  SARS Coronavirus 2 (CEPHEID - Performed in [place] hospital lab), Hosp Order     Status: None   Collection Time: 06/16/18  9:01 PM  Result Value Ref Range Status   SARS Coronavirus 2 NEGATIVE NEGATIVE Final    Comment: (NOTE) If result is NEGATIVE SARS-CoV-2 target nucleic acids are NOT DETECTED. The SARS-CoV-2 RNA is generally detectable in upper  and lower  respiratory specimens during the acute phase of infection. The lowest  concentration of SARS-CoV-2 viral copies this assay can detect is 250  copies / mL. A negative result does not preclude SARS-CoV-2 infection  and should not be used as the sole basis for treatment or other  patient management decisions.  A negative result may occur with  improper specimen collection / handling, submission of specimen other  than nasopharyngeal swab, presence of viral mutation(s) within the  areas targeted by this assay, and inadequate number of viral copies  (<250 copies / mL). A negative result must be combined with clinical  observations, patient history, and epidemiological information. If result is POSITIVE SARS-CoV-2 target nucleic acids are DETECTED. The SARS-CoV-2 RNA is generally detectable in upper and lower  respiratory specimens dur ing the acute phase of infection.  Positive  results are indicative of active infection with SARS-CoV-2.  Clinical  correlation with patient history and other diagnostic information is  necessary to determine patient infection status.  Positive results do  not rule out bacterial infection or co-infection with other viruses. If result is PRESUMPTIVE  POSTIVE SARS-CoV-2 nucleic acids MAY BE PRESENT.   A presumptive positive result was obtained on the submitted specimen  and confirmed on repeat testing.  While 2019 novel coronavirus  (SARS-CoV-2) nucleic acids may be present in the submitted sample  additional confirmatory testing may be necessary for epidemiological  and / or clinical management purposes  to differentiate between  SARS-CoV-2 and other Sarbecovirus currently known to infect humans.  If clinically indicated additional testing with an alternate test  methodology [phone]) is advised. The SARS-CoV-2 RNA is generally  detectable in upper and lower respiratory sp ecimens during the acute  phase of infection. The expected result is Negative. Fact Sheet for Patients:  StrictlyIdeas.no Fact Sheet for Healthcare Providers: BankingDealers.co.za This test is not yet approved or cleared by the [place] FDA and has been authorized for detection and/or diagnosis of SARS-CoV-2 by FDA under an Emergency Use Authorization (EUA).  This EUA will remain in effect (meaning this test can be used) for the duration of the COVID-19 declaration under Section 564(b)(1) of the Act, 21 U.S.C. section 360bbb-3(b)(1), unless the authorization is terminated or revoked sooner. Performed at [facility], [place] [address] [place], [place] [address]   Blood Culture (routine x 2)     Status: None (Preliminary result)   Collection Time: 06/16/18  9:26 PM  Result Value Ref Range Status   Specimen Description   Final    BLOOD BLOOD LEFT FOREARM Performed at [facility] [address] [place], [place] [address]    Special Requests   Final    BOTTLES DRAWN AEROBIC AND ANAEROBIC Blood Culture results may not be optimal due to an excessive volume of blood received in culture bottles Performed at [facility] [address], [place], [place]  [address]    Culture   Final    NO GROWTH 1 DAY Performed at [facility], [place] [address], [place], [place] [address]    Report Status PENDING  Incomplete  Urine Culture     Status: Abnormal (Preliminary result)   Collection Time: 06/16/18 10:21 PM  Result Value Ref Range Status   Specimen Description   Final    URINE, CLEAN CATCH Performed at [facility], [place] [address], [place],  [address]    Special Requests   Final  NONE Performed at [facility], [place] [address], [place], [place] [address]    Culture (A)  Final    40,000 COLONIES/mL GRAM POSITIVE COCCI IDENTIFICATION AND SUSCEPTIBILITIES TO FOLLOW Performed at [facility], [place] [address], [place], [place] [address]    Report Status PENDING  Incomplete  Blood Culture (routine x 2)     Status: None (Preliminary result)   Collection Time: 06/17/18  5:48 AM  Result Value Ref Range Status   Specimen Description   Final    BLOOD LEFT ARM Performed at [facility] [address], [place], [place] [address]    Special Requests   Final    BOTTLES DRAWN AEROBIC AND ANAEROBIC Blood Culture adequate volume Performed at [facility] [address], [place], [place] [address]    Culture   Final    NO GROWTH 1 DAY Performed at [facility], [place] [address] [place], [place] [address]    Report Status PENDING  Incomplete      Radiology Studies: Ct Head Wo Contrast  Result Date: 06/16/2018 CLINICAL DATA:  Fever and altered mental status EXAM: CT HEAD WITHOUT CONTRAST TECHNIQUE: Contiguous axial images were obtained from the base of the skull through the vertex without intravenous contrast. COMPARISON:  Head CT August 17, 2017 and brain MRI December 11, 2017; earlier brain MRI April 25, 2015 FINDINGS: Brain: Mild diffuse atrophy is stable. There is a stable meningioma at the posterior foramen magnum level measuring 8 x 8 mm. This small  meningioma does not show evident mass effect or edema. No other mass is evident. There is no hemorrhage, extra-axial fluid collection, or midline shift. There is patchy small vessel disease in the centra semiovale bilaterally. There is no evident acute infarct. Vascular: No hyperdense vessel. There is calcification in each carotid siphon region. Skull: The bony calvarium appears intact. Sinuses/Orbits: There is mucosal thickening in several ethmoid air cells. Other visualized paranasal sinuses are clear. Visualized orbits appear symmetric bilaterally. Other: Mastoid air cells are clear. IMPRESSION: Stable atrophy with periventricular small vessel disease. No evident acute infarct. Stable subcentimeter meningioma in the posterior foramen magnum region without mass effect or edema. No new mass evident. No hemorrhage. There are foci of arterial vascular calcification. There is mucosal thickening in several ethmoid air cells. Electronically Signed   By: Lowella Grip III M.D.   On: 06/16/2018 21:57   Dg Chest Port 1 View  Result Date: 06/16/2018 CLINICAL DATA:  Fever EXAM: PORTABLE CHEST 1 VIEW COMPARISON:  06/16/2018 FINDINGS: The cardiac silhouette is stable. There is no pneumothorax. No large pleural effusion. No significant area of consolidation. The lungs are somewhat hyperexpanded. IMPRESSION: No active disease. Electronically Signed   By: Constance Holster M.D.   On: 06/16/2018 22:05   Ct Renal Stone Study  Result Date: 06/16/2018 CLINICAL DATA:  Fever.  Confusion. EXAM: CT ABDOMEN AND PELVIS WITHOUT CONTRAST TECHNIQUE: Multidetector CT imaging of the abdomen and pelvis was performed following the standard protocol without IV contrast. COMPARISON:  CT dated 07/01/2006. FINDINGS: Lower chest: No acute abnormality. Hepatobiliary: Innumerable cysts are noted in the liver. The gallbladder is unremarkable. Pancreas: The pancreas is suboptimally evaluated in the absence of IV contrast. There is no definite  pancreatic mass. Spleen: Normal in size without focal abnormality. Adrenals/Urinary Tract: There are innumerable large cysts throughout both kidneys. Both kidneys are enlarged. There is some fullness of both collecting systems without evidence of an obstructing stone.  The bladder is decompressed which limits evaluation. Stomach/Bowel: The bowel is suboptimally evaluated secondary to lack of both oral and IV contrast. Additionally much of the bowel is compressed secondary to the patient's enlarged kidneys. Given these limitations, no definite abnormality is detected. There is no small bowel obstruction. The appendix is not well identified. Vascular/Lymphatic: Aortic atherosclerosis. No enlarged abdominal or pelvic lymph nodes. Reproductive: Prostate gland is significantly enlarged. There is a trace amount of free fluid in the patient's pelvis. Other: No abdominal wall hernia or abnormality. No abdominopelvic ascites. Musculoskeletal: No acute or significant osseous findings. IMPRESSION: 1. Limited study as detailed above. 2. No definite acute abnormality detected. 3. Again identified are findings of polycystic kidney disease. Electronically Signed   By: Constance Holster M.D.   On: 06/16/2018 23:18    Scheduled Meds:  amoxicillin-clavulanate  1 tablet Oral Q12H   buPROPion  150 mg Oral Daily   carbidopa-levodopa  1 tablet Oral 3 times per day   carbidopa-levodopa  1.5 tablet Oral Q24H   clonazePAM  0.5 mg Oral QHS   docusate sodium  100 mg Oral Daily   heparin  5,000 Units Subcutaneous Q8H   loratadine  10 mg Oral Daily   lubiprostone  24 mcg Oral BID WC   pantoprazole  40 mg Oral Daily   vitamin B-12  1,000 mcg Oral Daily   Continuous Infusions:  sodium chloride     vancomycin Stopped (06/17/18 2244)     LOS: 0 days   Assessment & Plan:   Principal Problem:   UTI (urinary tract infection) Active Problems:   Essential hypertension   G E R D   Polycystic kidney   BPH  associated with nocturia   Anxiety and depression   CRI (chronic renal insufficiency), stage 3 (moderate) (HCC)   Acute metabolic encephalopathy   Parkinsonism ([place])  Acute metabolic encephalopathy secondary to UTI: Patient much better and apparently back to his baseline as per his daughter.  Urine culture is growing enterococci which is pansensitive.  We will switch him from IV vancomycin to oral Augmentin.  Waiting for CBC today.  Deconditioning: So weak to me and according the daughter, he has been weaker progressively.  I recommended potential acute rehab however the daughter seems to have different opinion and they would prefer to take him home and outpatient physical therapy.  We agreed to consult PT OT first and then we will talk again.  BPH with acute urinary retention: Has Foley catheter in.  We will try to discontinue that and see how he does.  If required will insert Foley catheter again then he will need to follow-up with urology as an outpatient.  He is allergic to Flomax.  Acute on chronic kidney disease stage III with history of polycystic kidney disease: Waiting for labs today.  GERD: Continue PPI  Parkinsonism: Continue Sinemet from home dose.  PT OT to see him.  Hypokalemia: Waiting for labs today.  DVT prophylaxis: Heparin Code Status: Full code Family Communication: Discussed in detail with both daughters Amy and [name] and answered multiple questions to the best of my ability. Disposition Plan: We will order PT OT and potentially discharge in next 24 hours to home versus rehab.   Time spent: 40 minutes   Darliss Cheney, MD [facility] Pager [phone]  If 7PM-7AM, please contact night-coverage [email or web address] Password [facility] 06/18/2018, 11:32 AM

## 2018-06-19 ENCOUNTER — Ambulatory Visit: Payer: Medicare Other | Admitting: Internal Medicine

## 2018-06-19 DIAGNOSIS — N401 Enlarged prostate with lower urinary tract symptoms: Secondary | ICD-10-CM | POA: Diagnosis not present

## 2018-06-19 DIAGNOSIS — G20 Parkinson's disease: Secondary | ICD-10-CM | POA: Diagnosis not present

## 2018-06-19 DIAGNOSIS — G9341 Metabolic encephalopathy: Secondary | ICD-10-CM | POA: Diagnosis not present

## 2018-06-19 DIAGNOSIS — F419 Anxiety disorder, unspecified: Secondary | ICD-10-CM | POA: Diagnosis not present

## 2018-06-19 DIAGNOSIS — N3000 Acute cystitis without hematuria: Secondary | ICD-10-CM | POA: Diagnosis not present

## 2018-06-19 DIAGNOSIS — R351 Nocturia: Secondary | ICD-10-CM | POA: Diagnosis not present

## 2018-06-19 LAB — CBC WITH DIFFERENTIAL/PLATELET
Abs Immature Granulocytes: 0.11 10*3/uL — ABNORMAL HIGH (ref 0.00–0.07)
Basophils Absolute: 0.1 10*3/uL (ref 0.0–0.1)
Basophils Relative: 0 %
Eosinophils Absolute: 0 10*3/uL (ref 0.0–0.5)
Eosinophils Relative: 0 %
HCT: 45.6 % (ref 39.0–52.0)
Hemoglobin: 14.6 g/dL (ref 13.0–17.0)
Immature Granulocytes: 1 %
Lymphocytes Relative: 6 %
Lymphs Abs: 1 10*3/uL (ref 0.7–4.0)
MCH: 31.5 pg (ref 26.0–34.0)
MCHC: 32 g/dL (ref 30.0–36.0)
MCV: 98.3 fL (ref 80.0–100.0)
Monocytes Absolute: 1.2 10*3/uL — ABNORMAL HIGH (ref 0.1–1.0)
Monocytes Relative: 7 %
Neutro Abs: 14.2 10*3/uL — ABNORMAL HIGH (ref 1.7–7.7)
Neutrophils Relative %: 86 %
Platelets: 205 10*3/uL (ref 150–400)
RBC: 4.64 MIL/uL (ref 4.22–5.81)
RDW: 12.9 % (ref 11.5–15.5)
WBC: 16.6 10*3/uL — ABNORMAL HIGH (ref 4.0–10.5)
nRBC: 0 % (ref 0.0–0.2)

## 2018-06-19 LAB — COMPREHENSIVE METABOLIC PANEL
ALT: 18 U/L (ref 0–44)
AST: 40 U/L (ref 15–41)
Albumin: 3.2 g/dL — ABNORMAL LOW (ref 3.5–5.0)
Alkaline Phosphatase: 90 U/L (ref 38–126)
Anion gap: 13 (ref 5–15)
BUN: 27 mg/dL — ABNORMAL HIGH (ref 8–23)
CO2: 17 mmol/L — ABNORMAL LOW (ref 22–32)
Calcium: 8.2 mg/dL — ABNORMAL LOW (ref 8.9–10.3)
Chloride: 107 mmol/L (ref 98–111)
Creatinine, Ser: 1.36 mg/dL — ABNORMAL HIGH (ref 0.61–1.24)
GFR calc Af Amer: 57 mL/min — ABNORMAL LOW (ref >60)
GFR calc non Af Amer: 49 mL/min — ABNORMAL LOW (ref >60)
Glucose, Bld: 82 mg/dL (ref 70–99)
Potassium: 3.9 mmol/L (ref 3.5–5.1)
Sodium: 137 mmol/L (ref 135–145)
Total Bilirubin: 1.2 mg/dL (ref 0.3–1.2)
Total Protein: 6.9 g/dL (ref 6.5–8.1)

## 2018-06-19 MED ORDER — AMOXICILLIN-POT CLAVULANATE 875-125 MG PO TABS: 1.0000 | ORAL_TABLET | Freq: Two times a day (BID) | ORAL | 0 refills | Status: AC

## 2018-06-19 NOTE — TOC Transition Note (Signed)
Transition of Care [facility]) - CM/SW Discharge Note   Patient Details  Name: Henry Adams MRN: [id number] Date of Birth: [birth year]  Transition of Care [facility]) CM/SW Contact:  Leeroy Cha, RN Phone Number: 06/19/2018, 3:38 PM   Clinical Narrative:    Home with dme-rollinmg walker through [facility] and PT through [facility].   Final next level of care: [place] Barriers to Discharge: No Barriers Identified   Patient Goals and CMS Choice Patient states their goals for this hospitalization and ongoing recovery are:: Go home CMS Medicare.gov Compare Post Acute Care list provided to:: Patient Choice offered to / list presented to : Patient  Discharge Placement                       Discharge Plan and Services   Discharge Planning Services: CM Consult Post Acute Care Choice: Durable Medical Equipment, Home Health          DME Arranged: Walker rolling DME Agency: [facility] Date DME Agency Contacted: 06/19/18 Time DME Agency Contacted: 2800 Representative spoke with at DME Agency: [place]: RN, PT [facility] Agency: [place] ([place]) Date [place]: 06/19/18 Time [place]: 1537 Representative spoke with at [place]: [place] ([place]) Interventions     Readmission Risk Interventions Readmission Risk Prevention Plan 06/17/2018  Transportation Screening Complete  Home Care Screening Complete  Some recent data might be hidden

## 2018-06-19 NOTE — Progress Notes (Signed)
Called daughter [name] and went over AVS. She verbalized understanding.  She is on her way to pick up patient.  All patient's belongings including cell phone sent home with him.  New walker delivered to room and sent home with patient.  Patient went home with daughter via w/c and personal vehicle.  Virginia Rochester, RN

## 2018-06-19 NOTE — Evaluation (Signed)
Physical Therapy Evaluation Patient Details Name: Henry Adams MRN: [id number] DOB: 1940-05-07 Today's Date: 06/19/2018   History of Present Illness  78 y.o.malewith medical history significant ofparkinson's, memory issues, polycystic kidneys,BPH,GERD, depression with anxiety, CKD-3, who presents with fever and altered mental status.  Clinical Impression  Patient evaluated by Physical Therapy with no further acute PT needs identified. All education has been completed and the patient has no further questions. See below for any follow-up Physical Therapy or equipment needs. PT is signing off. Thank you for this referral.     Follow Up Recommendations Home health PT;Supervision for mobility/OOB(may need incr supervision initially d/t cognition)    Equipment Recommendations  Rolling walker with 5" wheels(if does not have, pt unsure)    Recommendations for Other Services       Precautions / Restrictions Precautions Precautions: Fall Restrictions Weight Bearing Restrictions: No      Mobility  Bed Mobility Overal bed mobility: Needs Assistance Bed Mobility: Rolling;Sidelying to Sit Rolling: Min guard Sidelying to sit: Min guard       General bed mobility comments: more facilitation than actual assist, decr initiation 2* Parkinson's  Transfers Overall transfer level: Needs assistance Equipment used: Rolling walker (2 wheeled) Transfers: Sit to/from Stand Sit to Stand: Min assist;Min guard         General transfer comment: 3 trials, requiring decr assist with each trial, facilitation for anterior - superior wt shift  Ambulation/Gait Ambulation/Gait assistance: Min guard Gait Distance (Feet): 220 Feet Assistive device: Rolling walker (2 wheeled) Gait Pattern/deviations: Step-through pattern;Decreased stride length;Trunk flexed;Narrow base of support     General Gait Details: cues for trunk extension and incr step length, min/guard for safety and balance  Stairs             Wheelchair Mobility    Modified Rankin (Stroke Patients Only)       Balance Overall balance assessment: Needs assistance Sitting-balance support: No upper extremity supported;Feet supported Sitting balance-Leahy Scale: Fair Sitting balance - Comments: difficulty with lateral wt shift d/t Parkinson's   Standing balance support: During functional activity;Bilateral upper extremity supported Standing balance-Leahy Scale: Fair Standing balance comment: reliant on UE support for balance                             Pertinent Vitals/Pain Pain Assessment: No/denies pain    Home Living Family/patient expects to be discharged to:: Private residence Living Arrangements: Spouse/significant other;Children Available Help at Discharge: Family             Additional Comments: pt is unable to give info/full hx d/t mild confusion    Prior Function           Comments: pt reports independence with gait; states he uses RW at times when going out--unsure of accuracy of this     Hand Dominance        Extremity/Trunk Assessment   Upper Extremity Assessment Upper Extremity Assessment: Generalized weakness    Lower Extremity Assessment Lower Extremity Assessment: Generalized weakness       Communication   Communication: No difficulties  Cognition Arousal/Alertness: Awake/alert Behavior During Therapy: WFL for tasks assessed/performed Overall Cognitive Status: Impaired/Different from baseline Area of Impairment: Orientation;Attention;Following commands                 Orientation Level: Disoriented to;Time;Place;Situation Current Attention Level: Sustained   Following Commands: Follows one step commands with increased time;Follows multi-step commands inconsistently  General Comments: confusion improving per staff      General Comments      Exercises     Assessment/Plan    PT Assessment All further PT needs can be met in  the next venue of care  PT Problem List         PT Treatment Interventions      PT Goals (Current goals can be found in the Care Plan section)  Acute Rehab PT Goals PT Goal Formulation: All assessment and education complete, DC therapy    Frequency     Barriers to discharge        Co-evaluation               AM-PAC PT "6 Clicks" Mobility  Outcome Measure Help needed turning from your back to your side while in a flat bed without using bedrails?: A Little Help needed moving from lying on your back to sitting on the side of a flat bed without using bedrails?: A Little Help needed moving to and from a bed to a chair (including a wheelchair)?: A Little Help needed standing up from a chair using your arms (e.g., wheelchair or bedside chair)?: A Little Help needed to walk in hospital room?: A Little Help needed climbing 3-5 steps with a railing? : A Little 6 Click Score: 18    End of Session Equipment Utilized During Treatment: Gait belt Activity Tolerance: Patient tolerated treatment well Patient left: in chair;with call bell/phone within reach;with chair alarm set Nurse Communication: Mobility status PT Visit Diagnosis: Unsteadiness on feet (R26.81);Other abnormalities of gait and mobility (R26.89)    Time: 3174-0992 PT Time Calculation (min) (ACUTE ONLY): 27 min   Charges:   PT Evaluation $PT Eval Low Complexity: 1 Low PT Treatments $Gait Training: 8-22 mins        Kenyon Ana, PT  Pager: [phone] Acute Rehab Dept [facility]): [phone]   06/19/2018   [facility] 06/19/2018, 2:56 PM

## 2018-06-19 NOTE — Discharge Instructions (Signed)
Urinary Tract Infection, Adult A urinary tract infection (UTI) is an infection of any part of the urinary tract. The urinary tract includes:  The kidneys.  The ureters.  The bladder.  The urethra. These organs make, store, and get rid of pee (urine) in the body. What are the causes? This is caused by germs (bacteria) in your genital area. These germs grow and cause swelling (inflammation) of your urinary tract. What increases the risk? You are more likely to develop this condition if:  You have a small, thin tube (catheter) to drain pee.  You cannot control when you pee or poop (incontinence).  You are male, and: ? You use these methods to prevent pregnancy: ? A medicine that kills sperm (spermicide). ? A device that blocks sperm (diaphragm). ? You have low levels of a male hormone (estrogen). ? You are pregnant.  You have genes that add to your risk.  You are sexually active.  You take antibiotic medicines.  You have trouble peeing because of: ? A prostate that is bigger than normal, if you are male. ? A blockage in the part of your body that drains pee from the bladder (urethra). ? A kidney stone. ? A nerve condition that affects your bladder (neurogenic bladder). ? Not getting enough to drink. ? Not peeing often enough.  You have other conditions, such as: ? Diabetes. ? A weak disease-fighting system (immune system). ? Sickle cell disease. ? Gout. ? Injury of the spine. What are the signs or symptoms? Symptoms of this condition include:  Needing to pee right away (urgently).  Peeing often.  Peeing small amounts often.  Pain or burning when peeing.  Blood in the pee.  Pee that smells bad or not like normal.  Trouble peeing.  Pee that is cloudy.  Fluid coming from the vagina, if you are male.  Pain in the belly or lower back. Other symptoms include:  Throwing up (vomiting).  No urge to eat.  Feeling mixed up (confused).  Being tired  and grouchy (irritable).  A fever.  Watery poop (diarrhea). How is this treated? This condition may be treated with:  Antibiotic medicine.  Other medicines.  Drinking enough water. Follow these instructions at home:  Medicines  Take over-the-counter and prescription medicines only as told by your doctor.  If you were prescribed an antibiotic medicine, take it as told by your doctor. Do not stop taking it even if you start to feel better. General instructions  Make sure you: ? Pee until your bladder is empty. ? Do not hold pee for a long time. ? Empty your bladder after sex. ? Wipe from front to back after pooping if you are a male. Use each tissue one time when you wipe.  Drink enough fluid to keep your pee pale yellow.  Keep all follow-up visits as told by your doctor. This is important. Contact a doctor if:  You do not get better after 1-2 days.  Your symptoms go away and then come back. Get help right away if:  You have very bad back pain.  You have very bad pain in your lower belly.  You have a fever.  You are sick to your stomach (nauseous).  You are throwing up. Summary  A urinary tract infection (UTI) is an infection of any part of the urinary tract.  This condition is caused by germs in your genital area.  There are many risk factors for a UTI. These include having a small, thin   tube to drain pee and not being able to control when you pee or poop.  Treatment includes antibiotic medicines for germs.  Drink enough fluid to keep your pee pale yellow. This information is not intended to replace advice given to you by your health care provider. Make sure you discuss any questions you have with your health care provider. Document Released: 06/27/2007 Document Revised: 07/18/2017 Document Reviewed: 07/18/2017 [employer] Interactive Patient Education  2019 [employer]  

## 2018-06-19 NOTE — Discharge Summary (Addendum)
Physician Discharge Summary  WEAVER TWEED PYP:[id number] DOB: Nov 05, 1940 DOA: 06/16/2018  PCP: [name], MD  Admit date: 06/16/2018 Discharge date: 06/19/2018  Admitted From: Home Disposition: Home  Recommendations for Outpatient Follow-up:  1. Follow up with PCP in 1-2 weeks 2. Please obtain BMP/CBC in one week 3. Please follow up on the following pending results:  Home Health: Yes Equipment/Devices: Rolling walker  Discharge Condition: Fair/stable CODE STATUS: Full code Diet recommendation: Heart healthy  Subjective: Seen and examined.  Confused slightly more than yesterday but had no complaint and looked comfortable.  Brief/Interim Summary: NISAIAH BECHTOL a 78 y.o.malewith medical history significant ofparkinson's, memory issues, polycystic kidneys,BPH,GERD, depression with anxiety, CKD-3, who presented with fever and altered mental status. He had a urine dipstick and a chest x-ray which were negative at that time. Patient's daughterreported to EDP thata couple of days ago they were at the urologist office and they did a bladder scan that showed up a lot of urine in there however they were unable to cath him. She is concerned thatptmay have urinary retention and infection. Pt was found to havepostvoid residual volume of 500cc inED. Patient denied dysuria or burning on urination.  CBC showed WBC 15.3, positive urinalysis (hazy appearance, large amount of leukocyte, rare bacteria, WBC>50), lactic acid0.9, alcohol level less than 10, ammonia 23, stable renal function, temperature 98.9, heart rate 70 to 90s, oxygen saturation 95% on room air. Chest x-ray negative. CT head is negative for acute intracranial abnormalities,but showeda stable subcentimeter meningioma in the posterior foramen magnum region without mass effect or edema.CT of renal stone study is alimited study, but is negative for acute findings except for knownpolycystic kidney disease. Patient was  admitted under hospitalist service due to acute metabolic encephalopathy secondary to UTI and he was started on broad-spectrum IV antibiotics and subsequently his urine culture was growing enterococci so he was switched to IV vancomycin and once the susceptibilities were back, he was again switched to oral Augmentin.  Patient was doing better yesterday however yesterday in the evening, he started having some delirium and required some sedative medications and mittens.  When seen this morning, he was pleasant but slightly confused.  I called patient's both sisters Amy and [name] yesterday and spent 10 minutes explaining to them and updating them about his current status and answered several questions.  I then called him again today and I spoke to them for 15 minutes where we went over a lot of information about him and answered several questions once again.  I recommended physical therapy evaluation since he seemed to be very weak.  Patient was seen by PT OT and they recommended home health with a rolling walker.  Case manager is working on providing all of that at this point in time for the patient.  I called patient's daughter [name] again and informed her about this and she wants the patient to come home as soon as possible believing that patient's confusion will get better at home when he will be at his family environment with family or people.  Medically he is stable other than some confusion which daughter believes will get better when he will be with her at home.  Of note, patient also had urinary retention for which he was placed Foley catheter in the emergency department which was removed today and patient has voided successfully since then.  I also explained to the daughter that if she thinks that he is having retention again at home then she  should contact patient's regular urologist.  She verbalizes understanding.  I am going to prescribe patient 10 more days of oral Augmentin to complete the course of  complicated UTI in the face of polycystic kidney disease.  Discharge Diagnoses:  Principal Problem:   UTI (urinary tract infection) Active Problems:   Essential hypertension   G E R D   Polycystic kidney   BPH associated with nocturia   Anxiety and depression   CRI (chronic renal insufficiency), stage 3 (moderate) (HCC)   Acute metabolic encephalopathy   Acute urinary retention   Parkinsonism [facility])   Discharge Instructions  Discharge Instructions    Discharge patient   Complete by:  As directed    Discharge disposition:  06-Home-Health Care Svc   Discharge patient date:  06/19/2018     Allergies as of 06/19/2018      Reactions   Iodine    rash   Oxycodone    Mental status changes & nausea   Pollen Extract Itching   Tamiflu [oseltamivir Phosphate] Other (See Comments)   hallucinations   Tamsulosin    Other reaction(s): Other (See Comments) Unknown    Doxazosin Rash   agitation   Etodolac Rash   unknown   Tape Rash   Certain "Band-Aids"      Medication List    TAKE these medications   acetaminophen 500 MG tablet Commonly known as:  TYLENOL Take 1,000 mg by mouth every 6 (six) hours as needed for moderate pain or fever.   amoxicillin-clavulanate 875-125 MG tablet Commonly known as:  AUGMENTIN Take 1 tablet by mouth every 12 (twelve) hours for 10 days.   buPROPion 150 MG 24 hr tablet Commonly known as:  WELLBUTRIN XL TAKE 1 TABLET ONCE DAILY.   Carbidopa-Levodopa ER 25-100 MG tablet controlled release Commonly known as:  SINEMET CR Take 1 tablet by mouth at bedtime. What changed:  Another medication with the same name was changed. Make sure you understand how and when to take each.   carbidopa-levodopa 25-100 MG tablet Commonly known as:  SINEMET IR 1 tablet at 7am/11am/3pm and 1.5 at 7pm What changed:    how much to take  how to take this  when to take this   clonazePAM 0.5 MG tablet Commonly known as:  KLONOPIN Take 1 tablet (0.5 mg total) by  mouth at bedtime.   docusate sodium 100 MG capsule Commonly known as:  COLACE Take 100 mg by mouth daily.   fexofenadine 180 MG tablet Commonly known as:  ALLEGRA Take 180 mg by mouth daily.   lubiprostone 24 MCG capsule Commonly known as:  AMITIZA Take 1 capsule (24 mcg total) by mouth 2 (two) times daily with a meal.   meclizine 12.5 MG tablet Commonly known as:  ANTIVERT Take 1 tablet (12.5 mg total) by mouth 3 (three) times daily as needed for dizziness.   MIRALAX PO Take 17 g by mouth daily.   omeprazole 40 MG capsule Commonly known as:  PRILOSEC TAKE (1) CAPSULE DAILY. What changed:  See the new instructions.   Prucalopride Succinate 2 MG Tabs Commonly known as:  Motegrity Take 2 mg by mouth daily.   vitamin B-12 1000 MCG tablet Commonly known as:  CYANOCOBALAMIN Take 1,000 mcg by mouth daily.            Durable Medical Equipment  (From admission, onward)         Start     Ordered   06/19/18 1527  For home use  only DME Walker rolling  Once    Question:  Patient needs a walker to treat with the following condition  Answer:  Weakness   06/19/18 1526         Follow-up Information    [name], MD Follow up in 1 week(s).   Specialty:  Internal Medicine Contact information: [address] [place] [address] [phone]          Allergies  Allergen Reactions  . Iodine     rash  . Oxycodone     Mental status changes & nausea  . Pollen Extract Itching  . Tamiflu [Oseltamivir Phosphate] Other (See Comments)    hallucinations  . Tamsulosin     Other reaction(s): Other (See Comments) Unknown   . Doxazosin Rash    agitation  . Etodolac Rash    unknown  . Tape Rash    Certain "Band-Aids"    Consultations:    Procedures/Studies: Dg Chest 2 View  Result Date: 06/16/2018 CLINICAL DATA:  Fever for 1 day. EXAM: CHEST - 2 VIEW COMPARISON:  Chest x-ray dated 03/27/2018 FINDINGS: Heart size and mediastinal contours are  within normal limits. Lungs are hyperexpanded. Lungs are clear. No pleural effusion seen. No acute or suspicious osseous finding. IMPRESSION: 1. No active cardiopulmonary disease. No evidence of pneumonia or pulmonary edema. 2. Hyperexpanded lungs indicating COPD. Electronically Signed   By: Franki Cabot M.D.   On: 06/16/2018 10:24   Ct Head Wo Contrast  Result Date: 06/16/2018 CLINICAL DATA:  Fever and altered mental status EXAM: CT HEAD WITHOUT CONTRAST TECHNIQUE: Contiguous axial images were obtained from the base of the skull through the vertex without intravenous contrast. COMPARISON:  Head CT August 17, 2017 and brain MRI December 11, 2017; earlier brain MRI April 25, 2015 FINDINGS: Brain: Mild diffuse atrophy is stable. There is a stable meningioma at the posterior foramen magnum level measuring 8 x 8 mm. This small meningioma does not show evident mass effect or edema. No other mass is evident. There is no hemorrhage, extra-axial fluid collection, or midline shift. There is patchy small vessel disease in the centra semiovale bilaterally. There is no evident acute infarct. Vascular: No hyperdense vessel. There is calcification in each carotid siphon region. Skull: The bony calvarium appears intact. Sinuses/Orbits: There is mucosal thickening in several ethmoid air cells. Other visualized paranasal sinuses are clear. Visualized orbits appear symmetric bilaterally. Other: Mastoid air cells are clear. IMPRESSION: Stable atrophy with periventricular small vessel disease. No evident acute infarct. Stable subcentimeter meningioma in the posterior foramen magnum region without mass effect or edema. No new mass evident. No hemorrhage. There are foci of arterial vascular calcification. There is mucosal thickening in several ethmoid air cells. Electronically Signed   By: Lowella Grip III M.D.   On: 06/16/2018 21:57   Dg Chest Port 1 View  Result Date: 06/16/2018 CLINICAL DATA:  Fever EXAM: PORTABLE CHEST 1  VIEW COMPARISON:  06/16/2018 FINDINGS: The cardiac silhouette is stable. There is no pneumothorax. No large pleural effusion. No significant area of consolidation. The lungs are somewhat hyperexpanded. IMPRESSION: No active disease. Electronically Signed   By: Constance Holster M.D.   On: 06/16/2018 22:05   Ct Renal Stone Study  Result Date: 06/16/2018 CLINICAL DATA:  Fever.  Confusion. EXAM: CT ABDOMEN AND PELVIS WITHOUT CONTRAST TECHNIQUE: Multidetector CT imaging of the abdomen and pelvis was performed following the standard protocol without IV contrast. COMPARISON:  CT dated 07/01/2006. FINDINGS: Lower chest:  No acute abnormality. Hepatobiliary: Innumerable cysts are noted in the liver. The gallbladder is unremarkable. Pancreas: The pancreas is suboptimally evaluated in the absence of IV contrast. There is no definite pancreatic mass. Spleen: Normal in size without focal abnormality. Adrenals/Urinary Tract: There are innumerable large cysts throughout both kidneys. Both kidneys are enlarged. There is some fullness of both collecting systems without evidence of an obstructing stone. The bladder is decompressed which limits evaluation. Stomach/Bowel: The bowel is suboptimally evaluated secondary to lack of both oral and IV contrast. Additionally much of the bowel is compressed secondary to the patient's enlarged kidneys. Given these limitations, no definite abnormality is detected. There is no small bowel obstruction. The appendix is not well identified. Vascular/Lymphatic: Aortic atherosclerosis. No enlarged abdominal or pelvic lymph nodes. Reproductive: Prostate gland is significantly enlarged. There is a trace amount of free fluid in the patient's pelvis. Other: No abdominal wall hernia or abnormality. No abdominopelvic ascites. Musculoskeletal: No acute or significant osseous findings. IMPRESSION: 1. Limited study as detailed above. 2. No definite acute abnormality detected. 3. Again identified are  findings of polycystic kidney disease. Electronically Signed   By: Constance Holster M.D.   On: 06/16/2018 23:18     Discharge Exam: Vitals:   06/18/18 2137 06/19/18 1009  BP:  119/71  Pulse: 86 (!) 108  Resp: 19 20  Temp: 98.8 F (37.1 C) 99.1 F (37.3 C)  SpO2: 95% 98%   Vitals:   06/18/18 1152 06/18/18 1349 06/18/18 2137 06/19/18 1009  BP:  (!) 146/80 139/87 119/71  Pulse:  82 86 (!) 108  Resp:   19 20  Temp:  98.1 F (36.7 C) 98.8 F (37.1 C) 99.1 F (37.3 C)  TempSrc:  Oral Oral   SpO2: 96%  95% 98%  Weight:      Height:        General: Pt is alert but disoriented, awake, not in acute distress Cardiovascular: RRR, S1/S2 +, no rubs, no gallops Respiratory: CTA bilaterally, no wheezing, no rhonchi Abdominal: Soft, NT, ND, bowel sounds + Extremities: no edema, no cyanosis    The results of significant diagnostics from this hospitalization (including imaging, microbiology, ancillary and laboratory) are listed below for reference.     Microbiology: Recent Results (from the past 240 hour(s))  Urine culture     Status: Abnormal   Collection Time: 06/16/18  9:57 AM  Result Value Ref Range Status   Specimen Description URINE, CLEAN CATCH  Final   Special Requests   Final    NONE Performed at [facility], [address], [place], [place] [address]    Culture >=100,000 COLONIES/mL ENTEROCOCCUS FAECALIS (A)  Final   Report Status 06/18/2018 FINAL  Final   Organism ID, Bacteria ENTEROCOCCUS FAECALIS (A)  Final      Susceptibility   Enterococcus faecalis - MIC*    AMPICILLIN <=2 SENSITIVE Sensitive     LEVOFLOXACIN 1 SENSITIVE Sensitive     NITROFURANTOIN <=16 SENSITIVE Sensitive     VANCOMYCIN 1 SENSITIVE Sensitive     * >=100,000 COLONIES/mL ENTEROCOCCUS FAECALIS  SARS Coronavirus 2 (CEPHEID - Performed in [place] hospital lab), Hosp Order     Status: None   Collection Time: 06/16/18  9:01 PM  Result Value Ref Range Status   SARS Coronavirus 2  NEGATIVE NEGATIVE Final    Comment: (NOTE) If result is NEGATIVE SARS-CoV-2 target nucleic acids are NOT DETECTED. The SARS-CoV-2 RNA is generally detectable in upper and lower  respiratory  specimens during the acute phase of infection. The lowest  concentration of SARS-CoV-2 viral copies this assay can detect is 250  copies / mL. A negative result does not preclude SARS-CoV-2 infection  and should not be used as the sole basis for treatment or other  patient management decisions.  A negative result may occur with  improper specimen collection / handling, submission of specimen other  than nasopharyngeal swab, presence of viral mutation(s) within the  areas targeted by this assay, and inadequate number of viral copies  (<250 copies / mL). A negative result must be combined with clinical  observations, patient history, and epidemiological information. If result is POSITIVE SARS-CoV-2 target nucleic acids are DETECTED. The SARS-CoV-2 RNA is generally detectable in upper and lower  respiratory specimens dur ing the acute phase of infection.  Positive  results are indicative of active infection with SARS-CoV-2.  Clinical  correlation with patient history and other diagnostic information is  necessary to determine patient infection status.  Positive results do  not rule out bacterial infection or co-infection with other viruses. If result is PRESUMPTIVE POSTIVE SARS-CoV-2 nucleic acids MAY BE PRESENT.   A presumptive positive result was obtained on the submitted specimen  and confirmed on repeat testing.  While 2019 novel coronavirus  (SARS-CoV-2) nucleic acids may be present in the submitted sample  additional confirmatory testing may be necessary for epidemiological  and / or clinical management purposes  to differentiate between  SARS-CoV-2 and other Sarbecovirus currently known to infect humans.  If clinically indicated additional testing with an alternate test  methodology [phone])  is advised. The SARS-CoV-2 RNA is generally  detectable in upper and lower respiratory sp ecimens during the acute  phase of infection. The expected result is Negative. Fact Sheet for Patients:  StrictlyIdeas.no Fact Sheet for Healthcare Providers: BankingDealers.co.za This test is not yet approved or cleared by the [place] FDA and has been authorized for detection and/or diagnosis of SARS-CoV-2 by FDA under an Emergency Use Authorization (EUA).  This EUA will remain in effect (meaning this test can be used) for the duration of the COVID-19 declaration under Section 564(b)(1) of the Act, 21 U.S.C. section 360bbb-3(b)(1), unless the authorization is terminated or revoked sooner. Performed at [facility], [place] [address], [place], [place] [address]   Blood Culture (routine x 2)     Status: None (Preliminary result)   Collection Time: 06/16/18  9:26 PM  Result Value Ref Range Status   Specimen Description   Final    BLOOD BLOOD LEFT FOREARM Performed at [facility] [address], [place], [place] [address]    Special Requests   Final    BOTTLES DRAWN AEROBIC AND ANAEROBIC Blood Culture results may not be optimal due to an excessive volume of blood received in culture bottles Performed at [facility] [address] [place], [place] [address]    Culture   Final    NO GROWTH 2 DAYS Performed at [facility] [address], [place], [place] [address]    Report Status PENDING  Incomplete  Urine Culture     Status: Abnormal   Collection Time: 06/16/18 10:21 PM  Result Value Ref Range Status   Specimen Description   Final    URINE, CLEAN CATCH Performed at [facility], [place] [address] [place], [place] [address]    Special Requests   Final    NONE Performed at [facility],  [place] [address], [place], [place] [address]     Culture 40,000 COLONIES/mL ENTEROCOCCUS FAECALIS (A)  Final   Report Status 06/18/2018 FINAL  Final   Organism ID, Bacteria ENTEROCOCCUS FAECALIS (A)  Final      Susceptibility   Enterococcus faecalis - MIC*    AMPICILLIN <=2 SENSITIVE Sensitive     LEVOFLOXACIN 0.5 SENSITIVE Sensitive     NITROFURANTOIN <=16 SENSITIVE Sensitive     VANCOMYCIN 1 SENSITIVE Sensitive     * 40,000 COLONIES/mL ENTEROCOCCUS FAECALIS  Blood Culture (routine x 2)     Status: None (Preliminary result)   Collection Time: 06/17/18  5:48 AM  Result Value Ref Range Status   Specimen Description   Final    BLOOD LEFT ARM Performed at [facility] [address], [place], [place] [address]    Special Requests   Final    BOTTLES DRAWN AEROBIC AND ANAEROBIC Blood Culture adequate volume Performed at [facility] [address], [place], [place] [address]    Culture   Final    NO GROWTH 2 DAYS Performed at [facility] [address], [place], [place] [address]    Report Status PENDING  Incomplete     Labs: BNP (last 3 results) No results for input(s): BNP in the last 8760 hours. Basic Metabolic Panel: Recent Labs  Lab 06/16/18 0957 06/16/18 2050 06/17/18 0548 06/18/18 0830 06/18/18 0831 06/19/18 0826  NA 137 136 136 136  --  137  K 4.1 3.5 3.4* 4.2  --  3.9  CL 103 105 108 106  --  107  CO2 26 22 20* 21*  --  17*  GLUCOSE 100* 104* 87 86  --  82  BUN 31* 34* 28* 21  --  27*  CREATININE 1.85* 1.78* 1.46* 1.42* 1.38* 1.36*  CALCIUM 8.6* 8.4* 8.0* 8.3*  --  8.2*  MG  --   --  2.0  --   --   --   PHOS  --   --  2.5  --   --   --    Liver Function Tests: Recent Labs  Lab 06/16/18 0957 06/16/18 2050 06/18/18 0830 06/19/18 0826  AST 16 17 35 40  ALT 11 6 17 18   ALKPHOS 88 86 92 90  BILITOT 1.8* 1.2 1.2 1.2  PROT 7.3 7.5 6.6 6.9  ALBUMIN 3.5 3.7 3.2* 3.2*   No results for input(s): LIPASE, AMYLASE in the last 168 hours. Recent Labs  Lab  06/16/18 2050  AMMONIA 23   CBC: Recent Labs  Lab 06/16/18 0957 06/16/18 2050 06/17/18 0548 06/18/18 0830 06/19/18 0826  WBC 15.5* 15.3* 13.4* 16.4* 16.6*  NEUTROABS  --  12.2*  --  13.2* 14.2*  HGB 13.7 13.2 13.2 15.1 14.6  HCT 41.8 40.4 41.0 46.2 45.6  MCV 96.5 98.5 99.8 98.9 98.3  PLT 179 176 166 194 205   Cardiac Enzymes: No results for input(s): CKTOTAL, CKMB, CKMBINDEX, TROPONINI in the last 168 hours. BNP: Invalid input(s): POCBNP CBG: Recent Labs  Lab 06/16/18 2059  GLUCAP 89   D-Dimer No results for input(s): DDIMER in the last 72 hours. Hgb A1c No results for input(s): HGBA1C in the last 72 hours. Lipid Profile No results for input(s): CHOL, HDL, LDLCALC, TRIG, CHOLHDL, LDLDIRECT in the last 72 hours. Thyroid function studies No results for input(s): TSH, T4TOTAL, T3FREE, THYROIDAB in the last 72 hours.  Invalid input(s): FREET3 Anemia work up No results  for input(s): VITAMINB12, FOLATE, FERRITIN, TIBC, IRON, RETICCTPCT in the last 72 hours. Urinalysis    Component Value Date/Time   COLORURINE YELLOW 06/16/2018 2050   APPEARANCEUR HAZY (A) 06/16/2018 2050   LABSPEC 1.024 06/16/2018 2050   PHURINE 5.0 06/16/2018 2050   GLUCOSEU NEGATIVE 06/16/2018 2050   GLUCOSEU NEGATIVE 10/30/2017 [place] 06/16/2018 2050   BILIRUBINUR NEGATIVE 06/16/2018 2050   BILIRUBINUR negative 03/27/2018 1340   KETONESUR 5 (A) 06/16/2018 2050   PROTEINUR 100 (A) 06/16/2018 2050   UROBILINOGEN 1.0 06/16/2018 0951   NITRITE NEGATIVE 06/16/2018 2050   LEUKOCYTESUR LARGE (A) 06/16/2018 2050   Sepsis Labs Invalid input(s): PROCALCITONIN,  WBC,  LACTICIDVEN Microbiology Recent Results (from the past 240 hour(s))  Urine culture     Status: Abnormal   Collection Time: 06/16/18  9:57 AM  Result Value Ref Range Status   Specimen Description URINE, CLEAN CATCH  Final   Special Requests   Final    NONE Performed at [facility], [place] [address],  [place], [place] [address]    Culture >=100,000 COLONIES/mL ENTEROCOCCUS FAECALIS (A)  Final   Report Status 06/18/2018 FINAL  Final   Organism ID, Bacteria ENTEROCOCCUS FAECALIS (A)  Final      Susceptibility   Enterococcus faecalis - MIC*    AMPICILLIN <=2 SENSITIVE Sensitive     LEVOFLOXACIN 1 SENSITIVE Sensitive     NITROFURANTOIN <=16 SENSITIVE Sensitive     VANCOMYCIN 1 SENSITIVE Sensitive     * >=100,000 COLONIES/mL ENTEROCOCCUS FAECALIS  SARS Coronavirus 2 (CEPHEID - Performed in [facility] hospital lab), Hosp Order     Status: None   Collection Time: 06/16/18  9:01 PM  Result Value Ref Range Status   SARS Coronavirus 2 NEGATIVE NEGATIVE Final    Comment: (NOTE) If result is NEGATIVE SARS-CoV-2 target nucleic acids are NOT DETECTED. The SARS-CoV-2 RNA is generally detectable in upper and lower  respiratory specimens during the acute phase of infection. The lowest  concentration of SARS-CoV-2 viral copies this assay can detect is 250  copies / mL. A negative result does not preclude SARS-CoV-2 infection  and should not be used as the sole basis for treatment or other  patient management decisions.  A negative result may occur with  improper specimen collection / handling, submission of specimen other  than nasopharyngeal swab, presence of viral mutation(s) within the  areas targeted by this assay, and inadequate number of viral copies  (<250 copies / mL). A negative result must be combined with clinical  observations, patient history, and epidemiological information. If result is POSITIVE SARS-CoV-2 target nucleic acids are DETECTED. The SARS-CoV-2 RNA is generally detectable in upper and lower  respiratory specimens dur ing the acute phase of infection.  Positive  results are indicative of active infection with SARS-CoV-2.  Clinical  correlation with patient history and other diagnostic information is  necessary to determine patient infection status.  Positive results do  not  rule out bacterial infection or co-infection with other viruses. If result is PRESUMPTIVE POSTIVE SARS-CoV-2 nucleic acids MAY BE PRESENT.   A presumptive positive result was obtained on the submitted specimen  and confirmed on repeat testing.  While 2019 novel coronavirus  (SARS-CoV-2) nucleic acids may be present in the submitted sample  additional confirmatory testing may be necessary for epidemiological  and / or clinical management purposes  to differentiate between  SARS-CoV-2 and other Sarbecovirus currently known to infect humans.  If clinically indicated  additional testing with an alternate test  methodology [phone]) is advised. The SARS-CoV-2 RNA is generally  detectable in upper and lower respiratory sp ecimens during the acute  phase of infection. The expected result is Negative. Fact Sheet for Patients:  StrictlyIdeas.no Fact Sheet for Healthcare Providers: BankingDealers.co.za This test is not yet approved or cleared by the [place] FDA and has been authorized for detection and/or diagnosis of SARS-CoV-2 by FDA under an Emergency Use Authorization (EUA).  This EUA will remain in effect (meaning this test can be used) for the duration of the COVID-19 declaration under Section 564(b)(1) of the Act, 21 U.S.C. section 360bbb-3(b)(1), unless the authorization is terminated or revoked sooner. Performed at [facility], [place] [address] [place], [place] [address]   Blood Culture (routine x 2)     Status: None (Preliminary result)   Collection Time: 06/16/18  9:26 PM  Result Value Ref Range Status   Specimen Description   Final    BLOOD BLOOD LEFT FOREARM Performed at [place] [address] [place], [place] [address]    Special Requests   Final    BOTTLES DRAWN AEROBIC AND ANAEROBIC Blood Culture results may not be optimal due to an excessive volume of blood received in culture  bottles Performed at [place] [facility] [address] [place], [place] [address]    Culture   Final    NO GROWTH 2 DAYS Performed at [facility] [address], [place], [place] [address]    Report Status PENDING  Incomplete  Urine Culture     Status: Abnormal   Collection Time: 06/16/18 10:21 PM  Result Value Ref Range Status   Specimen Description   Final    URINE, CLEAN CATCH Performed at [facility], [address], [place], [place] [address]    Special Requests   Final    NONE Performed at [facility], [place] [address], [place], [place] [address]    Culture 40,000 COLONIES/mL ENTEROCOCCUS FAECALIS (A)  Final   Report Status 06/18/2018 FINAL  Final   Organism ID, Bacteria ENTEROCOCCUS FAECALIS (A)  Final      Susceptibility   Enterococcus faecalis - MIC*    AMPICILLIN <=2 SENSITIVE Sensitive     LEVOFLOXACIN 0.5 SENSITIVE Sensitive     NITROFURANTOIN <=16 SENSITIVE Sensitive     VANCOMYCIN 1 SENSITIVE Sensitive     * 40,000 COLONIES/mL ENTEROCOCCUS FAECALIS  Blood Culture (routine x 2)     Status: None (Preliminary result)   Collection Time: 06/17/18  5:48 AM  Result Value Ref Range Status   Specimen Description   Final    BLOOD LEFT ARM Performed at [place] [address], [place], [place] [address]    Special Requests   Final    BOTTLES DRAWN AEROBIC AND ANAEROBIC Blood Culture adequate volume Performed at [facility] [address], [place], [place] [address]    Culture   Final    NO GROWTH 2 DAYS Performed at [place] [address], [place],  [address]    Report Status PENDING  Incomplete     Time coordinating discharge: 60 minutes spent on reviewing the chart, patient encounter, documentation, coordination of care with the nurses, case managers and spending time with the daughters over the phone twice.  SIGNED:   Darliss Cheney,  MD  [facility] 06/19/2018, 3:47 PM Pager [phone]  If 7PM-7AM,  please contact night-coverage [email or web address] Password [email or web address]

## 2018-06-20 ENCOUNTER — Other Ambulatory Visit: Payer: Self-pay | Admitting: Internal Medicine

## 2018-06-20 DIAGNOSIS — K219 Gastro-esophageal reflux disease without esophagitis: Secondary | ICD-10-CM

## 2018-06-20 DIAGNOSIS — A048 Other specified bacterial intestinal infections: Secondary | ICD-10-CM

## 2018-06-22 ENCOUNTER — Encounter: Payer: Self-pay | Admitting: Internal Medicine

## 2018-06-22 LAB — CULTURE, BLOOD (ROUTINE X 2)
Culture: NO GROWTH
Culture: NO GROWTH
Special Requests: ADEQUATE

## 2018-06-23 ENCOUNTER — Encounter: Payer: Self-pay | Admitting: Internal Medicine

## 2018-06-23 ENCOUNTER — Other Ambulatory Visit: Payer: Self-pay | Admitting: Internal Medicine

## 2018-06-23 ENCOUNTER — Telehealth: Payer: Self-pay | Admitting: Internal Medicine

## 2018-06-23 DIAGNOSIS — B370 Candidal stomatitis: Secondary | ICD-10-CM | POA: Insufficient documentation

## 2018-06-23 MED ORDER — CLOTRIMAZOLE 10 MG MT TROC: 10.0000 mg | Freq: Three times a day (TID) | OROMUCOSAL | 1 refills | Status: DC

## 2018-06-23 NOTE — Telephone Encounter (Signed)
Please advise on request for the nystatin mouth wash.

## 2018-06-23 NOTE — Telephone Encounter (Signed)
Copied from [place] [id number]. Topic: Quick Communication - Home Health Verbal Orders >> Jun 23, 2018  8:57 AM Valla Leaver wrote: Caller/Agency: Drue Dun, RN w/ [facility] Callback Number: [phone] Requesting OT/PT/Skilled Nursing/Social Work/Speech Therapy: skilled nursing Frequency: 1x a wk for 3 wks  Patient seen over weekend and has thrush from an antibiotic he's taking for UTI, and Drue Dun is requesting Nystatin mouthwash (swish and swallow) be called in for the patient.  Drue Dun wants to know if Dr. Ronnald Ramp wants her to collect any labs or urinalysis tests at this time?

## 2018-06-24 NOTE — Progress Notes (Signed)
Virtual Visit via Video Note The purpose of this virtual visit is to provide medical care while limiting exposure to the novel coronavirus.    Consent was obtained for video visit:  Yes.   Answered questions that patient had about telehealth interaction:  Yes.   I discussed the limitations, risks, security and privacy concerns of performing an evaluation and management service by telemedicine. I also discussed with the patient that there may be a patient responsible charge related to this service. The patient expressed understanding and agreed to proceed.  Pt location: Home Physician Location: office Name of referring provider:  [name], MD I connected with Henry Adams at patients initiation/request on 06/26/2018 at  2:30 PM EDT by video enabled telemedicine application and verified that I am speaking with the correct person using two identifiers. Pt MRN:  [id number] Pt DOB:  [birth year] Video Participants:  Henry Adams;  Daughter supplements history   History of Present Illness:  Patient seen today in follow-up for parkinsonism as well as memory change.  He is on carbidopa/levodopa 25/100, 1 tablet at 7 AM/11 AM/3 PM/ and 1.5 tablets 7 PM and carbidopa/levodopa 25/100 CR at bedtime.  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  We did discontinue his Valium last visit and increase his clonazepam at bedtime to 0.5 mg, 1 tablet nightly for restless leg.  They report that this helped.  He is c/o EDS but don't think that the klonopin is the cause.  medical records have been reviewed since last visit.  Patient was admitted to the hospital on May 25 and discharged on Jun 19, 2018 with urinary tract infection.  He had presented with fever and altered mental status.  Because of the altered mental status, he did have a CT of the brain.  This was stable, with small  vessel disease.  There was a subcentimeter meningioma in the posterior foramen magnum region.  I have reviewed this.  He has been for a week.  He is living with his daughter (has been x 6 months).  Daughter states that strength has improved since leaving the hospital.  Daughter asks about something to help "cognitively" and something to help "cope with the parkinsonism."  Daughter does state that he is back to baseline MS from 3 weeks ago.  pts daughter states that pt seems to have trouble with bills and level of understanding with his wife.  She also thinks that anxiety is an issue.  She thinks that these are 2 separate issues.   Current Outpatient Medications on File Prior to Visit  Medication Sig Dispense Refill   acetaminophen (TYLENOL) 500 MG tablet Take 1,000 mg by mouth every 6 (six) hours as needed for moderate pain or fever.     alfuzosin (UROXATRAL) 10 MG 24 hr tablet Take 1 tablet (10 mg total) by mouth daily with breakfast. 90 tablet 1   amoxicillin-clavulanate (AUGMENTIN) 875-125 MG tablet Take 1 tablet by mouth every 12 (twelve) hours for 10 days. 20 tablet 0   buPROPion (WELLBUTRIN XL) 150 MG 24 hr tablet TAKE 1 TABLET ONCE DAILY. (Patient taking differently: Take 150 mg by mouth daily. ) 30 tablet 5   carbidopa-levodopa (SINEMET IR) 25-100 MG tablet 1 tablet at 7am/11am/3pm and 1.5 at 7pm (Patient taking differently: Take 1-1.5 tablets by mouth See admin instructions. 1 tablet at 7am/11am/3pm and 1.5 at 7pm)  405 tablet 1   Carbidopa-Levodopa ER (SINEMET CR) 25-100 MG tablet controlled release Take 1 tablet by mouth at bedtime. 90 tablet 1   clonazePAM (KLONOPIN) 0.5 MG tablet Take 1 tablet (0.5 mg total) by mouth at bedtime. 90 tablet 1   clotrimazole (MYCELEX) 10 MG troche Take 1 tablet (10 mg total) by mouth 3 (three) times daily. 90 tablet 1   docusate sodium (COLACE) 100 MG capsule Take 100 mg by mouth daily.     fexofenadine (ALLEGRA) 180 MG tablet Take 180 mg by mouth  daily.       finasteride (PROSCAR) 5 MG tablet Take 5 mg by mouth daily.     lubiprostone (AMITIZA) 24 MCG capsule Take 1 capsule (24 mcg total) by mouth 2 (two) times daily with a meal. 60 capsule 3   omeprazole (PRILOSEC) 40 MG capsule TAKE (1) CAPSULE DAILY. 90 capsule 0   Polyethylene Glycol 3350 (MIRALAX PO) Take 17 g by mouth daily.      Prucalopride Succinate (MOTEGRITY) 2 MG TABS Take 2 mg by mouth daily. 30 tablet 11   vitamin B-12 (CYANOCOBALAMIN) 1000 MCG tablet Take 1,000 mcg by mouth daily.     No current facility-administered medications on file prior to visit.      Observations/Objective:   Vitals:   06/26/18 1409  Weight: 157 lb (71.2 kg)  Height: 5\' 11"  (1.803 m)   GEN:  The patient appears stated age and is in NAD.  Neurological examination:  Orientation: The patient is alert and oriented to person and place.  He has trouble with time.  He is somewhat tangential today and has trouble getting out his thoughts and words. Cranial nerves: There is good facial symmetry. There is significant facial hypomimia.  The speech is fluent and clear. Soft palate rises symmetrically and there is no tongue deviation. Hearing is intact to conversational tone. Motor: Strength is at least antigravity x 4.   Shoulder shrug is equal and symmetric.  There is no pronator drift.  Movement examination: Tone: unable Abnormal movements: none Coordination:  Not testing Gait and Station: not tested    Assessment and Plan:   1.  Memory loss             -He had neurocognitive testing in 05/2017 and no evidence of dementia.  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  It has gotten worse with wifes transition to memory care living and he is now living with daughter.     I do think he has probably transition to a dementia since May, 2019 and so to see his daughter. Did not do memory testing today because he has only been out of the hospital a few days from a significant  urinary tract infection which caused memory change.  He is still on the antibiotics.  We will likely get him scheduled for neurocognitive testing, once the coronavirus pandemic has passed and once Dr. Melvyn Novas has started.  -going to see if I can add ST for cognitive therapy to his [facility] services in the home.  -Discussed donepezil.  Decided to go ahead and start low-dose, 5 mg daily.  Discussed risk, benefits, and side effects.  Understanding was expressed.  Will let me know if he has diarrhea.   2.  Peripheral neuropathy             -The patient has clinical examination evidence of a diffuse peripheral neuropathy, which certainly can affect gait and balance.  We discussed safety  associated with peripheral neuropathy.  We discussed balance therapy and the importance of ambulatory assistive device for balance assistance.             -on b12 supplement   3.  Parkinsonism              -He will continue carbidopa/levodopa 25/100, 1 tablet at 7 AM/11 AM/3 PM/ and 7 PM to 1.5 tablets.  Did discuss that we may transition the daytime to CR if he remains fatigued with daytime hypersomnolence, but and making several other changes today, so decided to hold on that.             -Continue carbidopa/levodopa 25/100 CR at bedtime.  He did not think that the 50/200 helped and thought he had more side effects.  -they are awaiting PT from [facility] post hospitalization  4.  Depression             -may need to raise wellbutrin xl back to 300 mg daily.  Didn't do that myself because changing other meds today (as did last visit).  Pt is not SI.  Encouraged him to verbalize feelings to daughter/counselor.   5.RLS             -d/c valium             -Klonopin 0.5 mg at night helped the RLS but I ask them to hold it for the next 3-4 days to make sure it isn't contributing to EDS (doubt as timeline isn't right).   If not, they will email me and let me know they have restarted it.  Discussed extensively risks, benefits, and  side effects.  Will let us know if this throws off his balance or causes more confusion.             -hold on starting rozerem for now  6.  Constipation             -following with GI             -increase hydration  Follow Up Instructions:  4 weeks  -I discussed the assessment and treatment plan with the patient. The patient was provided an opportunity to ask questions and all were answered. The patient agreed with the plan and demonstrated an understanding of the instructions.   The patient was advised to call back or seek an in-person evaluation if the symptoms worsen or if the condition fails to improve as anticipated.    Total Time spent in visit with the patient was:  40 min, of which more than 50% of the time was spent in counseling and/or coordinating care as above.   Pt understands and agrees with the plan of care outlined.     Alonza Bogus, DO

## 2018-06-25 ENCOUNTER — Other Ambulatory Visit: Payer: Self-pay

## 2018-06-25 ENCOUNTER — Encounter: Payer: Self-pay | Admitting: Internal Medicine

## 2018-06-25 ENCOUNTER — Ambulatory Visit: Payer: Medicare Other | Admitting: Internal Medicine

## 2018-06-25 ENCOUNTER — Other Ambulatory Visit (INDEPENDENT_AMBULATORY_CARE_PROVIDER_SITE_OTHER): Payer: Medicare Other

## 2018-06-25 VITALS — BP 130/80 | HR 88 | Temp 97.6°F | Resp 16 | Ht 71.0 in | Wt 155.0 lb

## 2018-06-25 DIAGNOSIS — R3911 Hesitancy of micturition: Secondary | ICD-10-CM

## 2018-06-25 DIAGNOSIS — N183 Chronic kidney disease, stage 3 unspecified: Secondary | ICD-10-CM

## 2018-06-25 DIAGNOSIS — I10 Essential (primary) hypertension: Secondary | ICD-10-CM | POA: Diagnosis not present

## 2018-06-25 DIAGNOSIS — B952 Enterococcus as the cause of diseases classified elsewhere: Secondary | ICD-10-CM | POA: Diagnosis not present

## 2018-06-25 DIAGNOSIS — K5904 Chronic idiopathic constipation: Secondary | ICD-10-CM

## 2018-06-25 DIAGNOSIS — N390 Urinary tract infection, site not specified: Secondary | ICD-10-CM | POA: Diagnosis not present

## 2018-06-25 DIAGNOSIS — N401 Enlarged prostate with lower urinary tract symptoms: Secondary | ICD-10-CM

## 2018-06-25 LAB — CBC WITH DIFFERENTIAL/PLATELET
Basophils Absolute: 0.1 10*3/uL (ref 0.0–0.1)
Basophils Relative: 0.8 % (ref 0.0–3.0)
Eosinophils Absolute: 0.2 10*3/uL (ref 0.0–0.7)
Eosinophils Relative: 2.6 % (ref 0.0–5.0)
HCT: 38.9 % — ABNORMAL LOW (ref 39.0–52.0)
Hemoglobin: 13.2 g/dL (ref 13.0–17.0)
Lymphocytes Relative: 17.1 % (ref 12.0–46.0)
Lymphs Abs: 1.4 10*3/uL (ref 0.7–4.0)
MCHC: 34 g/dL (ref 30.0–36.0)
MCV: 94.6 fl (ref 78.0–100.0)
Monocytes Absolute: 0.8 10*3/uL (ref 0.1–1.0)
Monocytes Relative: 9.5 % (ref 3.0–12.0)
Neutro Abs: 5.7 10*3/uL (ref 1.4–7.7)
Neutrophils Relative %: 70 % (ref 43.0–77.0)
Platelets: 290 10*3/uL (ref 150.0–400.0)
RBC: 4.11 Mil/uL — ABNORMAL LOW (ref 4.22–5.81)
RDW: 14.1 % (ref 11.5–15.5)
WBC: 8.2 10*3/uL (ref 4.0–10.5)

## 2018-06-25 LAB — BASIC METABOLIC PANEL
BUN: 25 mg/dL — ABNORMAL HIGH (ref 6–23)
CO2: 28 mEq/L (ref 19–32)
Calcium: 8.7 mg/dL (ref 8.4–10.5)
Chloride: 102 mEq/L (ref 96–112)
Creatinine, Ser: 1.5 mg/dL (ref 0.40–1.50)
GFR: 44.99 mL/min — ABNORMAL LOW (ref >60.00)
Glucose, Bld: 117 mg/dL — ABNORMAL HIGH (ref 70–99)
Potassium: 4.1 mEq/L (ref 3.5–5.1)
Sodium: 137 mEq/L (ref 135–145)

## 2018-06-25 MED ORDER — ALFUZOSIN HCL ER 10 MG PO TB24: 10.0000 mg | ORAL_TABLET | Freq: Every day | ORAL | 1 refills | Status: DC

## 2018-06-25 NOTE — Patient Instructions (Signed)

## 2018-06-25 NOTE — Progress Notes (Signed)
Subjective:  Patient ID: Henry Adams, male    DOB: [birth year]  Age: 78 y.o. MRN: [id number]  CC: Urinary Tract Infection   HPI Henry Adams presents for a hosp f/up --- He continues to complain of difficulty with his urination.  He complains of urinary frequency, hesitancy, and nocturia.  He denies dysuria or hematuria.  He was recently seen by a urologist for urinary retention.  He was also recently admitted for altered mental status and fever and was found to have an enterococcus UTI.  He is completing the antibiotic regimen for this.  He continues to have a positive review of systems but his daughter who is with him today says that none of his symptoms are worsening or changing.  Discharge Diagnoses:  Principal Problem:   UTI (urinary tract infection) Active Problems:   Essential hypertension   G E R D   Polycystic kidney   BPH associated with nocturia   Anxiety and depression   CRI (chronic renal insufficiency), stage 3 (moderate) (HCC)   Acute metabolic encephalopathy   Acute urinary retention   Parkinsonism ([place])  Outpatient Medications Prior to Visit  Medication Sig Dispense Refill  . acetaminophen (TYLENOL) 500 MG tablet Take 1,000 mg by mouth every 6 (six) hours as needed for moderate pain or fever.    [name] amoxicillin-clavulanate (AUGMENTIN) 875-125 MG tablet Take 1 tablet by mouth every 12 (twelve) hours for 10 days. 20 tablet 0  . buPROPion (WELLBUTRIN XL) 150 MG 24 hr tablet TAKE 1 TABLET ONCE DAILY. (Patient taking differently: Take 150 mg by mouth daily. ) 30 tablet 5  . carbidopa-levodopa (SINEMET IR) 25-100 MG tablet 1 tablet at 7am/11am/3pm and 1.5 at 7pm (Patient taking differently: Take 1-1.5 tablets by mouth See admin instructions. 1 tablet at 7am/11am/3pm and 1.5 at 7pm) 405 tablet 1  . Carbidopa-Levodopa ER (SINEMET CR) 25-100 MG tablet controlled release Take 1 tablet by mouth at bedtime. 90 tablet 1  . clonazePAM (KLONOPIN) 0.5 MG tablet Take 1 tablet (0.5 mg  total) by mouth at bedtime. 90 tablet 1  . clotrimazole (MYCELEX) 10 MG troche Take 1 tablet (10 mg total) by mouth 3 (three) times daily. 90 tablet 1  . docusate sodium (COLACE) 100 MG capsule Take 100 mg by mouth daily.    . fexofenadine (ALLEGRA) 180 MG tablet Take 180 mg by mouth daily.      [name] lubiprostone (AMITIZA) 24 MCG capsule Take 1 capsule (24 mcg total) by mouth 2 (two) times daily with a meal. 60 capsule 3  . omeprazole (PRILOSEC) 40 MG capsule TAKE (1) CAPSULE DAILY. 90 capsule 0  . Polyethylene Glycol 3350 (MIRALAX PO) Take 17 g by mouth daily.     . Prucalopride Succinate (MOTEGRITY) 2 MG TABS Take 2 mg by mouth daily. 30 tablet 11  . vitamin B-12 (CYANOCOBALAMIN) 1000 MCG tablet Take 1,000 mcg by mouth daily.    . meclizine (ANTIVERT) 12.5 MG tablet Take 1 tablet (12.5 mg total) by mouth 3 (three) times daily as needed for dizziness. 30 tablet 0   No facility-administered medications prior to visit.     ROS Review of Systems  Constitutional: Positive for fatigue. Negative for chills, diaphoresis and fever.  HENT: Negative.  Negative for trouble swallowing.   Eyes: Negative for visual disturbance.  Respiratory: Negative for cough, shortness of breath and wheezing.   Cardiovascular: Negative for chest pain, palpitations and leg swelling.  Gastrointestinal: Positive for constipation. Negative for abdominal pain, diarrhea,  nausea and vomiting.  Genitourinary: Positive for difficulty urinating. Negative for dysuria, flank pain, hematuria, testicular pain and urgency.  Musculoskeletal: Negative for arthralgias and myalgias.  Skin: Negative.  Negative for color change.  Neurological: Positive for tremors. Negative for dizziness and light-headedness.  Hematological: Negative.  Negative for adenopathy. Does not bruise/bleed easily.  Psychiatric/Behavioral: Positive for confusion, decreased concentration and sleep disturbance. Negative for self-injury and suicidal ideas. The patient  is nervous/anxious.     Objective:  BP 130/80 (BP Location: Left Arm, Patient Position: Sitting, Cuff Size: Normal)   Pulse 88   Temp 97.6 F (36.4 C) (Oral)   Resp 16   Ht 5\' 11"  (1.803 m)   Wt 155 lb (70.3 kg)   SpO2 97%   BMI 21.62 kg/m   BP Readings from Last 3 Encounters:  06/25/18 130/80  06/19/18 119/71  06/16/18 116/76    Wt Readings from Last 3 Encounters:  06/25/18 155 lb (70.3 kg)  06/17/18 159 lb 9.8 oz (72.4 kg)  04/10/18 158 lb (71.7 kg)    Physical Exam Vitals signs reviewed.  Constitutional:      Appearance: He is not ill-appearing or diaphoretic.  HENT:     Nose: Nose normal. No congestion or rhinorrhea.     Mouth/Throat:     Pharynx: No oropharyngeal exudate.  Eyes:     General: No scleral icterus. Neck:     Musculoskeletal: Normal range of motion and neck supple.  Cardiovascular:     Rate and Rhythm: Regular rhythm.     Heart sounds: No murmur. No gallop.   Pulmonary:     Breath sounds: No stridor. No wheezing, rhonchi or rales.  Abdominal:     General: Abdomen is flat.     Palpations: There is no hepatomegaly, splenomegaly or mass.     Tenderness: There is no abdominal tenderness.  Musculoskeletal: Normal range of motion.     Right lower leg: No edema.     Left lower leg: No edema.  Lymphadenopathy:     Cervical: No cervical adenopathy.  Skin:    General: Skin is warm and dry.  Neurological:     General: No focal deficit present.  Psychiatric:        Attention and Perception: Perception normal. He is inattentive.        Mood and Affect: Mood is anxious and depressed.        Speech: Speech normal.        Behavior: Behavior is slowed.        Thought Content: Thought content is not paranoid. Thought content does not include homicidal or suicidal ideation.        Cognition and Memory: Cognition is impaired. Memory is impaired. He exhibits impaired recent memory and impaired remote memory.        Judgment: Judgment normal.     Lab  Results  Component Value Date   WBC 8.2 06/25/2018   HGB 13.2 06/25/2018   HCT 38.9 (L) 06/25/2018   PLT 290.0 06/25/2018   GLUCOSE 117 (H) 06/25/2018   CHOL 179 11/14/2016   TRIG 73.0 11/14/2016   HDL 68.50 11/14/2016   LDLDIRECT 131.7 07/02/2011   LDLCALC 96 11/14/2016   ALT 18 06/19/2018   AST 40 06/19/2018   NA 137 06/25/2018   K 4.1 06/25/2018   CL 102 06/25/2018   CREATININE 1.50 06/25/2018   BUN 25 (H) 06/25/2018   CO2 28 06/25/2018   TSH 1.36 06/25/2018   PSA 2.28 08/11/2015  INR 1.1 (H) 08/01/2012   HGBA1C 5.9 12/29/2009    Dg Chest 2 View  Result Date: 06/16/2018 CLINICAL DATA:  Fever for 1 day. EXAM: CHEST - 2 VIEW COMPARISON:  Chest x-ray dated 03/27/2018 FINDINGS: Heart size and mediastinal contours are within normal limits. Lungs are hyperexpanded. Lungs are clear. No pleural effusion seen. No acute or suspicious osseous finding. IMPRESSION: 1. No active cardiopulmonary disease. No evidence of pneumonia or pulmonary edema. 2. Hyperexpanded lungs indicating COPD. Electronically Signed   By: Franki Cabot M.D.   On: 06/16/2018 10:24   Ct Head Wo Contrast  Result Date: 06/16/2018 CLINICAL DATA:  Fever and altered mental status EXAM: CT HEAD WITHOUT CONTRAST TECHNIQUE: Contiguous axial images were obtained from the base of the skull through the vertex without intravenous contrast. COMPARISON:  Head CT August 17, 2017 and brain MRI December 11, 2017; earlier brain MRI April 25, 2015 FINDINGS: Brain: Mild diffuse atrophy is stable. There is a stable meningioma at the posterior foramen magnum level measuring 8 x 8 mm. This small meningioma does not show evident mass effect or edema. No other mass is evident. There is no hemorrhage, extra-axial fluid collection, or midline shift. There is patchy small vessel disease in the centra semiovale bilaterally. There is no evident acute infarct. Vascular: No hyperdense vessel. There is calcification in each carotid siphon region. Skull:  The bony calvarium appears intact. Sinuses/Orbits: There is mucosal thickening in several ethmoid air cells. Other visualized paranasal sinuses are clear. Visualized orbits appear symmetric bilaterally. Other: Mastoid air cells are clear. IMPRESSION: Stable atrophy with periventricular small vessel disease. No evident acute infarct. Stable subcentimeter meningioma in the posterior foramen magnum region without mass effect or edema. No new mass evident. No hemorrhage. There are foci of arterial vascular calcification. There is mucosal thickening in several ethmoid air cells. Electronically Signed   By: Lowella Grip III M.D.   On: 06/16/2018 21:57   Dg Chest Port 1 View  Result Date: 06/16/2018 CLINICAL DATA:  Fever EXAM: PORTABLE CHEST 1 VIEW COMPARISON:  06/16/2018 FINDINGS: The cardiac silhouette is stable. There is no pneumothorax. No large pleural effusion. No significant area of consolidation. The lungs are somewhat hyperexpanded. IMPRESSION: No active disease. Electronically Signed   By: Constance Holster M.D.   On: 06/16/2018 22:05   Ct Renal Stone Study  Result Date: 06/16/2018 CLINICAL DATA:  Fever.  Confusion. EXAM: CT ABDOMEN AND PELVIS WITHOUT CONTRAST TECHNIQUE: Multidetector CT imaging of the abdomen and pelvis was performed following the standard protocol without IV contrast. COMPARISON:  CT dated 07/01/2006. FINDINGS: Lower chest: No acute abnormality. Hepatobiliary: Innumerable cysts are noted in the liver. The gallbladder is unremarkable. Pancreas: The pancreas is suboptimally evaluated in the absence of IV contrast. There is no definite pancreatic mass. Spleen: Normal in size without focal abnormality. Adrenals/Urinary Tract: There are innumerable large cysts throughout both kidneys. Both kidneys are enlarged. There is some fullness of both collecting systems without evidence of an obstructing stone. The bladder is decompressed which limits evaluation. Stomach/Bowel: The bowel is  suboptimally evaluated secondary to lack of both oral and IV contrast. Additionally much of the bowel is compressed secondary to the patient's enlarged kidneys. Given these limitations, no definite abnormality is detected. There is no small bowel obstruction. The appendix is not well identified. Vascular/Lymphatic: Aortic atherosclerosis. No enlarged abdominal or pelvic lymph nodes. Reproductive: Prostate gland is significantly enlarged. There is a trace amount of free fluid in the patient's pelvis. Other: No abdominal  wall hernia or abnormality. No abdominopelvic ascites. Musculoskeletal: No acute or significant osseous findings. IMPRESSION: 1. Limited study as detailed above. 2. No definite acute abnormality detected. 3. Again identified are findings of polycystic kidney disease. Electronically Signed   By: Constance Holster M.D.   On: 06/16/2018 23:18    Assessment & Plan:   Nicolis was seen today for urinary tract infection.  Diagnoses and all orders for this visit:  UTI (urinary tract infection) due to Enterococcus- Based on his symptoms it appears that this has been adequately treated.  I will recheck the urine culture to confirm resolution. -     CULTURE, URINE COMPREHENSIVE; Future  Benign prostatic hyperplasia with urinary hesitancy- I have asked him to start taking a peripheral alpha-blocker to try to avoid urinary retention. -     alfuzosin (UROXATRAL) 10 MG 24 hr tablet; Take 1 tablet (10 mg total) by mouth daily with breakfast.  Essential hypertension- His blood pressure is adequately well controlled. -     CBC with Differential/Platelet; Future -     Basic metabolic panel; Future  BPH associated with nocturia  CRI (chronic renal insufficiency), stage 3 (moderate) ([place])- His renal function has improved some.  He will avoid nephrotoxic agents. -     CBC with Differential/Platelet; Future -     Basic metabolic panel; Future   I have discontinued Josie Saunders. Camero's meclizine. I am  also having him start on alfuzosin. Additionally, I am having him maintain his fexofenadine, vitamin B-12, Carbidopa-Levodopa ER, carbidopa-levodopa, Polyethylene Glycol 3350 (MIRALAX PO), Prucalopride Succinate, lubiprostone, clonazePAM, buPROPion, docusate sodium, acetaminophen, amoxicillin-clavulanate, omeprazole, and clotrimazole.  Meds ordered this encounter  Medications  . alfuzosin (UROXATRAL) 10 MG 24 hr tablet    Sig: Take 1 tablet (10 mg total) by mouth daily with breakfast.    Dispense:  90 tablet    Refill:  1     Follow-up: Return if symptoms worsen or fail to improve.  Scarlette Calico, MD

## 2018-06-25 NOTE — Telephone Encounter (Signed)
I have not yet seen a response on the nystatin mouth wash for thrush. Please advise.   Also, Henry Adams wanted to know if you wanted her to collect any labs or urine sample for UA. No sx at this time. Please advise.

## 2018-06-25 NOTE — Telephone Encounter (Signed)
I sent in an RX for mycelex troches No labs at this time  TJ

## 2018-06-26 ENCOUNTER — Telehealth: Payer: Self-pay | Admitting: Internal Medicine

## 2018-06-26 ENCOUNTER — Encounter: Payer: Self-pay | Admitting: Neurology

## 2018-06-26 ENCOUNTER — Telehealth (INDEPENDENT_AMBULATORY_CARE_PROVIDER_SITE_OTHER): Payer: Medicare Other | Admitting: Neurology

## 2018-06-26 ENCOUNTER — Telehealth: Payer: Self-pay

## 2018-06-26 DIAGNOSIS — G4710 Hypersomnia, unspecified: Secondary | ICD-10-CM | POA: Diagnosis not present

## 2018-06-26 DIAGNOSIS — R413 Other amnesia: Secondary | ICD-10-CM

## 2018-06-26 DIAGNOSIS — G20 Parkinson's disease: Secondary | ICD-10-CM

## 2018-06-26 MED ORDER — DONEPEZIL HCL 5 MG PO TABS: 5.0000 mg | ORAL_TABLET | Freq: Every day | ORAL | 0 refills | Status: DC

## 2018-06-26 NOTE — Telephone Encounter (Signed)
Left detailed message for Henry Adams informing the mycelex troches were sent in and that labs were not needed at this time.

## 2018-06-26 NOTE — Telephone Encounter (Signed)
Verbal okay for requested PT orders

## 2018-06-26 NOTE — Telephone Encounter (Signed)
Called [facility] unable to get someone on the phone unable to leave message on hold for next available representative. Waiting time 7 mins  Will call back later.

## 2018-06-26 NOTE — Telephone Encounter (Signed)
-----   Message from Lacey, DO sent at 06/26/2018  3:45 PM EDT ----- Pt was just d/c from [facility] set him up with [facility] for PT.  Please call them and ask them and see if they can do ST for cognitive therapy.  If so, send order

## 2018-06-26 NOTE — Telephone Encounter (Signed)
Copied from [place] [id number]. Topic: Quick Communication - Home Health Verbal Orders >> Jun 26, 2018 11:12 AM Rayann Heman wrote: Caller/Agency: stacey/ [facility]  Callback Number: [phone] Requesting pt Frequency: 2x2 1 every other week for 7 weeks

## 2018-06-27 ENCOUNTER — Telehealth: Payer: Self-pay

## 2018-06-27 LAB — CULTURE, URINE COMPREHENSIVE
MICRO NUMBER:: 533016
RESULT:: NO GROWTH
SPECIMEN QUALITY:: ADEQUATE

## 2018-06-27 LAB — THYROID PANEL WITH TSH
Free Thyroxine Index: 2 (ref 1.4–3.8)
T3 Uptake: 34 % (ref 22–35)
T4, Total: 6 ug/dL (ref 4.9–10.5)
TSH: 1.36 mIU/L (ref 0.40–4.50)

## 2018-06-27 NOTE — Telephone Encounter (Signed)
Copied from [place] [id number]. Topic: General - Inquiry >> Jun 27, 2018  2:29 PM Virl Axe D wrote: Reason for CRM: Pt's daughter stated that Dr. Ronnald Ramp put pt on new medication alfuzosin (UROXATRAL) 10 MG 24 hr tablet which is causing nausea and dizziness. Daughter stated she was going to discontinue medication. Please advise.

## 2018-06-28 ENCOUNTER — Encounter: Payer: Self-pay | Admitting: Internal Medicine

## 2018-06-28 NOTE — Telephone Encounter (Signed)
Ask her not to restart this and to discuss this with his urologist.

## 2018-06-30 DIAGNOSIS — Q613 Polycystic kidney, unspecified: Secondary | ICD-10-CM | POA: Diagnosis not present

## 2018-06-30 DIAGNOSIS — N183 Chronic kidney disease, stage 3 (moderate): Secondary | ICD-10-CM | POA: Diagnosis not present

## 2018-06-30 DIAGNOSIS — I129 Hypertensive chronic kidney disease with stage 1 through stage 4 chronic kidney disease, or unspecified chronic kidney disease: Secondary | ICD-10-CM | POA: Diagnosis not present

## 2018-06-30 DIAGNOSIS — E785 Hyperlipidemia, unspecified: Secondary | ICD-10-CM | POA: Diagnosis not present

## 2018-06-30 NOTE — Telephone Encounter (Signed)
Called spoke with [name] no referral needed the speech therapist will call to go over orders.

## 2018-06-30 NOTE — Telephone Encounter (Signed)
Spoke to [facility] and she stated that she is continuing the medication and the side effects are much better. [name] stated pt has had no nausea but he has experienced slight dizziness.

## 2018-06-30 NOTE — Telephone Encounter (Signed)
[phone] 

## 2018-07-02 DIAGNOSIS — G9341 Metabolic encephalopathy: Secondary | ICD-10-CM | POA: Diagnosis not present

## 2018-07-02 DIAGNOSIS — N390 Urinary tract infection, site not specified: Secondary | ICD-10-CM | POA: Diagnosis not present

## 2018-07-02 DIAGNOSIS — N401 Enlarged prostate with lower urinary tract symptoms: Secondary | ICD-10-CM | POA: Diagnosis not present

## 2018-07-02 DIAGNOSIS — R338 Other retention of urine: Secondary | ICD-10-CM | POA: Diagnosis not present

## 2018-07-02 DIAGNOSIS — Z79899 Other long term (current) drug therapy: Secondary | ICD-10-CM | POA: Diagnosis not present

## 2018-07-02 DIAGNOSIS — G20 Parkinson's disease: Secondary | ICD-10-CM | POA: Diagnosis not present

## 2018-07-02 DIAGNOSIS — N183 Chronic kidney disease, stage 3 (moderate): Secondary | ICD-10-CM | POA: Diagnosis not present

## 2018-07-02 DIAGNOSIS — Q613 Polycystic kidney, unspecified: Secondary | ICD-10-CM | POA: Diagnosis not present

## 2018-07-03 ENCOUNTER — Telehealth: Payer: Self-pay | Admitting: Internal Medicine

## 2018-07-03 ENCOUNTER — Encounter: Payer: Self-pay | Admitting: Internal Medicine

## 2018-07-03 NOTE — Telephone Encounter (Signed)
Routing to dr john, please advise, I will call patient back, thanks 

## 2018-07-03 NOTE — Telephone Encounter (Signed)
Ok to consider and ROV with any MD or doxy visit

## 2018-07-03 NOTE — Telephone Encounter (Signed)
Copied from [place] [id number]. Topic: Quick Communication - Home Health Verbal Orders >> Jul 03, 2018  3:43 PM Percell Belt A wrote: Caller/Agency:  [facility] Callback Number: [phone] Requesting OT/PT/Skilled Nursing/Social Work/Speech Therapy: pt feel that they do not need any more nursing visit .  Pt daughter would like know if pt needs repeat urinalysis. Pt has completed antibiotics

## 2018-07-04 NOTE — Telephone Encounter (Signed)
Patients daughter does not feel her dad needs an appointment, she just didn't know if we repeated urinalysis after finishing an abx.

## 2018-07-04 NOTE — Telephone Encounter (Signed)
Daughter does not feel he needs to come into office. She would like a response from Dr.Jones once he returns to office.

## 2018-07-04 NOTE — Telephone Encounter (Signed)
I have talked with patient's daughter and advised that if patient is symptom free, feeling better, no need for repeat urinalysis, is symptoms present again, call us back at that time---she states patient is feeling better

## 2018-07-04 NOTE — Telephone Encounter (Signed)
Routing to brittany/sched----if patient is still having symptoms --per dr Jenny Reichmann, ok for patient to come in for routine office visit---can you schedule appt if needed --thanks

## 2018-07-08 ENCOUNTER — Other Ambulatory Visit: Payer: Self-pay | Admitting: Internal Medicine

## 2018-07-08 DIAGNOSIS — Q613 Polycystic kidney, unspecified: Secondary | ICD-10-CM | POA: Diagnosis not present

## 2018-07-08 DIAGNOSIS — Z79899 Other long term (current) drug therapy: Secondary | ICD-10-CM

## 2018-07-08 DIAGNOSIS — G20 Parkinson's disease: Secondary | ICD-10-CM | POA: Diagnosis not present

## 2018-07-08 DIAGNOSIS — N390 Urinary tract infection, site not specified: Secondary | ICD-10-CM | POA: Diagnosis not present

## 2018-07-08 DIAGNOSIS — F5104 Psychophysiologic insomnia: Secondary | ICD-10-CM

## 2018-07-08 DIAGNOSIS — R338 Other retention of urine: Secondary | ICD-10-CM

## 2018-07-08 DIAGNOSIS — G9341 Metabolic encephalopathy: Secondary | ICD-10-CM | POA: Diagnosis not present

## 2018-07-08 DIAGNOSIS — N401 Enlarged prostate with lower urinary tract symptoms: Secondary | ICD-10-CM

## 2018-07-08 DIAGNOSIS — N183 Chronic kidney disease, stage 3 (moderate): Secondary | ICD-10-CM

## 2018-07-08 MED ORDER — QUETIAPINE FUMARATE 25 MG PO TABS: 25.0000 mg | ORAL_TABLET | Freq: Every day | ORAL | 1 refills | Status: DC

## 2018-07-12 ENCOUNTER — Other Ambulatory Visit: Payer: Self-pay | Admitting: Neurology

## 2018-07-14 DIAGNOSIS — R3914 Feeling of incomplete bladder emptying: Secondary | ICD-10-CM | POA: Diagnosis not present

## 2018-07-14 DIAGNOSIS — N189 Chronic kidney disease, unspecified: Secondary | ICD-10-CM | POA: Diagnosis not present

## 2018-07-14 DIAGNOSIS — N390 Urinary tract infection, site not specified: Secondary | ICD-10-CM | POA: Diagnosis not present

## 2018-07-14 DIAGNOSIS — Q612 Polycystic kidney, adult type: Secondary | ICD-10-CM | POA: Diagnosis not present

## 2018-07-14 DIAGNOSIS — N183 Chronic kidney disease, stage 3 (moderate): Secondary | ICD-10-CM | POA: Diagnosis not present

## 2018-07-14 DIAGNOSIS — N401 Enlarged prostate with lower urinary tract symptoms: Secondary | ICD-10-CM | POA: Diagnosis not present

## 2018-07-14 DIAGNOSIS — I129 Hypertensive chronic kidney disease with stage 1 through stage 4 chronic kidney disease, or unspecified chronic kidney disease: Secondary | ICD-10-CM | POA: Diagnosis not present

## 2018-07-14 NOTE — Telephone Encounter (Signed)
Requested Prescriptions   Pending Prescriptions Disp Refills  . carbidopa-levodopa (SINEMET IR) 25-100 MG tablet [Pharmacy Med Name: CARBIDOPA-LEVODOPA 25-100 TAB] 135 tablet 0    Sig: TAKE 1 TABLET AT 7AM, 11AM, 3PM AND TAKE 1&1/2 TABLETS AT 7PM.   Rx last filled:01/23/18 #90 1 refills   Pt last seen:06/26/18  Follow up appt scheduled:07/24/18

## 2018-07-18 ENCOUNTER — Encounter

## 2018-07-18 ENCOUNTER — Ambulatory Visit: Payer: Medicare Other | Admitting: Neurology

## 2018-07-22 ENCOUNTER — Encounter: Payer: Self-pay | Admitting: Neurology

## 2018-07-22 NOTE — Progress Notes (Signed)
Virtual Visit via Video Note The purpose of this virtual visit is to provide medical care while limiting exposure to the novel coronavirus.    Consent was obtained for video visit:  Yes.   Answered questions that patient had about telehealth interaction:  Yes.   I discussed the limitations, risks, security and privacy concerns of performing an evaluation and management service by telemedicine. I also discussed with the patient that there may be a patient responsible charge related to this service. The patient expressed understanding and agreed to proceed.  Pt location: Home Physician Location: home Name of referring provider:  [name], MD I connected with Henry Adams at patients initiation/request on 07/23/2018 at 10:45 AM EDT by video enabled telemedicine application and verified that I am speaking with the correct person using two identifiers. Pt MRN:  [id number] Pt DOB:  1940/03/10 Video Participants:  Henry Adams;  Daughter supplements hx   History of Present Illness:  Patient seen today in follow-up for memory change and parkinsonism.  I saw him about 4 weeks ago and started him on donepezil, 5 mg daily.  Daughter states that they started it 5-6 days after our last visit.  He is tolerating it.   We did refer him to [facility] for speech/cognitive therapy.   Patient is also on carbidopa/levodopa 25/100, 1 tablet at 7 AM/11 AM/3 PM and 1.5 tablets at 7 PM.  He is also on carbidopa/levodopa 25/100 CR at bedtime.  Last visit, I asked the patient to hold his clonazepam for 3 or 4 days just to make sure it was not contributing to daytime hypersomnolence (although I was not convinced it was).  They emailed me a few days later and stated that they did not do this, because he was having trouble with his alfuzosin (Uroxatrol) and they wanted to make sure that he was tolerating that first before they trialed this.     His daughter also states today that they didn't want to change this  since he is having so much trouble sleeping.  I have reviewed his medical record since our last visit.  They emailed to Dr. Ronnald Ramp on July 03, 2018 to state that the patient was having trouble sleeping.  They asked the primary care physician if it was okay to take melatonin or if there was another recommendation.  Quetiapine was called into the pharmacy.  His daughter opted not to give him this medication.  Pts daughter does think that he is depressed, esp with separation from wife, who just went in the hospital yesterday.  "I feel absolutely isolated from my spouse."  Went to counseling before coronavirus, but did not find it helpful.  C/o burning paresthesias in the feet after he got a pedicure at a nail salon, but it is better now.  Daughter asks me if she should expect that in the future because of his neuropathy.  Remains on his B12.   Current Outpatient Medications on File Prior to Visit  Medication Sig Dispense Refill   acetaminophen (TYLENOL) 500 MG tablet Take 1,000 mg by mouth every 6 (six) hours as needed for moderate pain or fever.     alfuzosin (UROXATRAL) 10 MG 24 hr tablet Take 1 tablet (10 mg total) by mouth daily with breakfast. 90 tablet 1   carbidopa-levodopa (SINEMET IR) 25-100 MG tablet TAKE 1 TABLET AT 7AM, 11AM, 3PM AND TAKE 1&1/2 TABLETS AT 7PM. 135 tablet 1   Carbidopa-Levodopa ER (SINEMET CR) 25-100 MG  tablet controlled release Take 1 tablet by mouth at bedtime. 90 tablet 1   clonazePAM (KLONOPIN) 0.5 MG tablet Take 1 tablet (0.5 mg total) by mouth at bedtime. 90 tablet 1   clotrimazole (MYCELEX) 10 MG troche Take 1 tablet (10 mg total) by mouth 3 (three) times daily. 90 tablet 1   docusate sodium (COLACE) 100 MG capsule Take 100 mg by mouth daily.     donepezil (ARICEPT) 5 MG tablet Take 1 tablet (5 mg total) by mouth at bedtime. 30 tablet 0   fexofenadine (ALLEGRA) 180 MG tablet Take 180 mg by mouth daily.       finasteride (PROSCAR) 5 MG tablet Take 5 mg by mouth  daily.     lubiprostone (AMITIZA) 24 MCG capsule Take 1 capsule (24 mcg total) by mouth 2 (two) times daily with a meal. 60 capsule 3   omeprazole (PRILOSEC) 40 MG capsule TAKE (1) CAPSULE DAILY. 90 capsule 0   Polyethylene Glycol 3350 (MIRALAX PO) Take 17 g by mouth daily.      vitamin B-12 (CYANOCOBALAMIN) 1000 MCG tablet Take 1,000 mcg by mouth daily.     No current facility-administered medications on file prior to visit.      Observations/Objective:   Vitals:   07/23/18 0846  Weight: 156 lb (70.8 kg)  Height: 5\' 11"  (1.803 m)   GEN:  The patient appears stated age and is in NAD.  Neurological examination:  Orientation:  Montreal Cognitive Assessment  07/23/2018 02/04/2017  Visuospatial/ Executive (0/5) 2 3  Naming (0/3) 2 1  Attention: Read list of digits (0/2) 1 2  Attention: Read list of letters (0/1) 1 1  Attention: Serial 7 subtraction starting at 100 (0/3) 3 1  Language: Repeat phrase (0/2) 0 2  Language : Fluency (0/1) 0 0  Abstraction (0/2) 1 2  Delayed Recall (0/5) 0 2  Orientation (0/6) 5 6  Total 15 20  Adjusted Score (based on education) 15 20   Cranial nerves: There is good facial symmetry. There is facial hypomimia.  The speech is fluent and clear. Soft palate rises symmetrically and there is no tongue deviation. Hearing is intact to conversational tone. Motor: Strength is at least antigravity x 4.   Shoulder shrug is equal and symmetric.  There is no pronator drift.  Movement examination: Tone: unable Abnormal movements: None Coordination:  There is no decremation with RAM's, with hand opening and closing her finger taps bilaterally Gait and Station: Not tested.  Video was done in a small room today.  Lab Results  Component Value Date   VITAMINB12 361 11/14/2016      Assessment and Plan:   1. Memory loss -He had neurocognitive testing in 05/2017 and no evidence of dementia. 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 It has gotten worse with wifes transition to memory care living and he is now living with daughter.   I do think he has probably transition to a dementia since May, 2019 and discussed this in detail today, and also discussed that his undertreated depression continues to play a role in his memory.   -We will decide on neurocognitive testing after we make changes to meds and refer to psychiatry.             -Continue donepezil  -Discussed online resources, including Parkinson's foundation, [employer] classes, RSB that he could participate in.  Told him to contact my Education officer, museum for male information.   2. Peripheral neuropathy -The  patient has clinical examination evidence of a diffuse peripheral neuropathy, which certainly can affect gait and balance. We discussed safety associated with peripheral neuropathy. We discussed balance therapy and the importance of ambulatory assistive device for balance assistance. -on b12 supplement.  We will recheck that in the future when we have an in person appointment.  -Told daughter I would rather have the patient's nails trimmed at podiatry office rather than salon.  His feet hurt significantly after last salon visit   3. Parkinsonism  -He will continue carbidopa/levodopa 25/100, 1 tablet at 7 AM/11 AM/3 PM/ and 7 PM to 1.5 tablets.  Did discuss that we may transition the daytime to CR if he remains fatigued with daytime hypersomnolence, but and making several other changes today, so decided to hold on that. -Continue carbidopa/levodopa 25/100 CR at bedtime. He did not think that the 50/200 helped and thought he had more side effects  4. Depression -increase wellbutrin XL, 300 mg daily.  pt stopped counseling but encouraged him to restart that.  He was agreeable to a referral.  He was also agreeable to a psychiatry referral with further  follow-up.  5.RLS -Continue clonazepam, 0.5 mg at bedtime. -hold on starting rozerem for now  6.Constipation -following with GI -increase hydration  7.  Insomnia  -Try melatonin, 3 mg nightly.    Follow Up Instructions:  5-6 months  -I discussed the assessment and treatment plan with the patient. The patient was provided an opportunity to ask questions and all were answered. The patient agreed with the plan and demonstrated an understanding of the instructions.   The patient was advised to call back or seek an in-person evaluation if the symptoms worsen or if the condition fails to improve as anticipated.    Total Time spent in visit with the patient was:  40 min, of which more than 50% of the time was spent in counseling, as above.   Pt understands and agrees with the plan of care outlined.     Alonza Bogus, DO

## 2018-07-23 ENCOUNTER — Encounter: Payer: Self-pay | Admitting: Neurology

## 2018-07-23 ENCOUNTER — Telehealth (INDEPENDENT_AMBULATORY_CARE_PROVIDER_SITE_OTHER): Payer: Medicare Other | Admitting: Neurology

## 2018-07-23 ENCOUNTER — Other Ambulatory Visit: Payer: Self-pay

## 2018-07-23 ENCOUNTER — Telehealth: Payer: Self-pay

## 2018-07-23 VITALS — Ht 71.0 in | Wt 156.0 lb

## 2018-07-23 DIAGNOSIS — G2581 Restless legs syndrome: Secondary | ICD-10-CM | POA: Diagnosis not present

## 2018-07-23 DIAGNOSIS — F0281 Dementia in other diseases classified elsewhere with behavioral disturbance: Secondary | ICD-10-CM | POA: Diagnosis not present

## 2018-07-23 DIAGNOSIS — F331 Major depressive disorder, recurrent, moderate: Secondary | ICD-10-CM

## 2018-07-23 DIAGNOSIS — G20 Parkinson's disease: Secondary | ICD-10-CM

## 2018-07-23 DIAGNOSIS — F02818 Dementia in other diseases classified elsewhere, unspecified severity, with other behavioral disturbance: Secondary | ICD-10-CM

## 2018-07-23 MED ORDER — BUPROPION HCL ER (XL) 300 MG PO TB24: 300.0000 mg | ORAL_TABLET | Freq: Every day | ORAL | 1 refills | Status: DC

## 2018-07-23 NOTE — Telephone Encounter (Signed)
Orders placed.

## 2018-07-23 NOTE — Telephone Encounter (Signed)
-----   Message from [place], DO sent at 07/23/2018 12:01 PM EDT ----- 1.  Needs referral to [facility] for counseling for depression 2.  Needs referral to Dr. Lynder Parents, psychiatry, for depression

## 2018-07-24 ENCOUNTER — Ambulatory Visit: Payer: Medicare Other | Admitting: Neurology

## 2018-07-25 ENCOUNTER — Other Ambulatory Visit: Payer: Self-pay | Admitting: Neurology

## 2018-07-28 NOTE — Telephone Encounter (Signed)
Requested Prescriptions   Pending Prescriptions Disp Refills  . Carbidopa-Levodopa ER (SINEMET CR) 25-100 MG tablet controlled release [Pharmacy Med Name: CARBIDOPA-LEVO ER 25-100 TAB] 90 tablet 0    Sig: TAKE ONE TABLET AT BEDTIME.   Rx last filled:01/23/18 #90 1 REFILLS  Pt last seen:07/23/18  Follow up appt scheduled:12/26/18

## 2018-07-29 ENCOUNTER — Telehealth: Payer: Self-pay | Admitting: Clinical

## 2018-07-29 NOTE — Telephone Encounter (Signed)
LCSW spoke with pt's daughter [name] to discuss exercise options, also sent her options via email. Daughter says she will discuss w pt given he is not well versed in accessing meeting online.

## 2018-07-31 ENCOUNTER — Other Ambulatory Visit: Payer: Self-pay | Admitting: Neurology

## 2018-08-01 MED ORDER — DONEPEZIL HCL 5 MG PO TABS: 5.0000 mg | ORAL_TABLET | Freq: Every day | ORAL | 1 refills | Status: DC

## 2018-08-04 DIAGNOSIS — G20 Parkinson's disease: Secondary | ICD-10-CM | POA: Diagnosis not present

## 2018-08-04 DIAGNOSIS — Z79899 Other long term (current) drug therapy: Secondary | ICD-10-CM | POA: Diagnosis not present

## 2018-08-04 DIAGNOSIS — N183 Chronic kidney disease, stage 3 (moderate): Secondary | ICD-10-CM | POA: Diagnosis not present

## 2018-08-04 DIAGNOSIS — N401 Enlarged prostate with lower urinary tract symptoms: Secondary | ICD-10-CM | POA: Diagnosis not present

## 2018-08-04 DIAGNOSIS — G9341 Metabolic encephalopathy: Secondary | ICD-10-CM | POA: Diagnosis not present

## 2018-08-04 DIAGNOSIS — N390 Urinary tract infection, site not specified: Secondary | ICD-10-CM | POA: Diagnosis not present

## 2018-08-04 DIAGNOSIS — R338 Other retention of urine: Secondary | ICD-10-CM | POA: Diagnosis not present

## 2018-08-04 DIAGNOSIS — Q613 Polycystic kidney, unspecified: Secondary | ICD-10-CM | POA: Diagnosis not present

## 2018-08-11 ENCOUNTER — Other Ambulatory Visit: Payer: Self-pay | Admitting: Gastroenterology

## 2018-08-12 ENCOUNTER — Telehealth: Payer: Self-pay

## 2018-08-12 MED ORDER — BUPROPION HCL ER (XL) 150 MG PO TB24: 150.0000 mg | ORAL_TABLET | Freq: Every day | ORAL | 1 refills | Status: DC

## 2018-08-12 NOTE — Telephone Encounter (Signed)
yes

## 2018-08-12 NOTE — Telephone Encounter (Signed)
Rx e-filed for 150 mg to pharmacy today

## 2018-08-12 NOTE — Telephone Encounter (Signed)
Hard fax from [facility] requesting refill on Bupropion HCL XL 150 MG DAILY.   Per pharmacy note: pt daughter says he did not tolerate 300 mg dose well and request new Rx for bupropion XL 150 mg daily instead.  Notice e-mail from yesterday.  Ok to send in 150 MG of Bupropion XL   Please advise

## 2018-08-17 DIAGNOSIS — Z711 Person with feared health complaint in whom no diagnosis is made: Secondary | ICD-10-CM | POA: Diagnosis not present

## 2018-08-17 DIAGNOSIS — Z20828 Contact with and (suspected) exposure to other viral communicable diseases: Secondary | ICD-10-CM | POA: Diagnosis not present

## 2018-08-22 DIAGNOSIS — N183 Chronic kidney disease, stage 3 (moderate): Secondary | ICD-10-CM | POA: Diagnosis not present

## 2018-08-28 DIAGNOSIS — R350 Frequency of micturition: Secondary | ICD-10-CM | POA: Diagnosis not present

## 2018-08-28 DIAGNOSIS — N401 Enlarged prostate with lower urinary tract symptoms: Secondary | ICD-10-CM | POA: Diagnosis not present

## 2018-08-28 DIAGNOSIS — R3915 Urgency of urination: Secondary | ICD-10-CM | POA: Diagnosis not present

## 2018-09-01 ENCOUNTER — Ambulatory Visit (INDEPENDENT_AMBULATORY_CARE_PROVIDER_SITE_OTHER): Payer: Medicare Other | Admitting: Psychology

## 2018-09-01 ENCOUNTER — Ambulatory Visit: Payer: Medicare Other | Admitting: Psychology

## 2018-09-01 ENCOUNTER — Other Ambulatory Visit: Payer: Self-pay

## 2018-09-01 DIAGNOSIS — F4323 Adjustment disorder with mixed anxiety and depressed mood: Secondary | ICD-10-CM

## 2018-09-01 DIAGNOSIS — G3184 Mild cognitive impairment, so stated: Secondary | ICD-10-CM

## 2018-09-01 NOTE — Progress Notes (Signed)
   Neuropsychology Note   Henry Adams completed 120 minutes of neuropsychological testing with technician, Henry Adams, BS, under the supervision of Henry Adams, Ph.D., licensed psychologist. The patient did not appear overtly distressed by the testing session, per behavioral observation or via self-report to the technician. Rest breaks were offered.    Clinical Decision Making: In considering the patient's current level of functioning, level of presumed impairment, nature of symptoms, emotional and behavioral responses during the interview, level of literacy, and observed level of motivation/effort, a battery of tests was selected and communicated to the psychometrician.   Communication between the psychologist and technician was ongoing throughout the testing session and changes were made as deemed necessary based on patient performance on testing, technician observations and additional pertinent factors such as those listed above.   Henry Adams will return within approximately two weeks for an interactive feedback session with Henry Adams at which time his test performances, clinical impressions, and treatment recommendations will be reviewed in detail. The patient understands he can contact our office should he require our assistance before this time.   Full report to follow.  120 minutes spent face-to-face with patient administering standardized tests, 15 minutes spent scoring Environmental education officer). [CPT [id number], [id number]]

## 2018-09-01 NOTE — Progress Notes (Signed)
NEUROPSYCHOLOGICAL EVALUATION [address]. [facility] Department of Neurology  Reason for Referral:   Henry Adams is a 77 y.o. Caucasian male referred by Alonza Bogus, D.O., to characterize his current cognitive functioning and assist with diagnostic clarity and treatment planning in the context of the presence of parkinsonisms, subjective cognitive decline, and several medical and psychiatric comorbidities.  Assessment and Plan:   Clinical Impression(s): Overall, Henry Adams pattern of performance is suggestive of notable performance variability, with primary areas of impairment surrounding processing speed and expressive language. Additional weaknesses were observed across domains of executive functioning and visuospatial abilities. 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  The etiology of Henry Adams cognitive difficulties is difficult to determine. It appears most consistent with his history of mild to moderate small vessel ischemic changes, which have progressed since 2017. Additionally, his performance on the current evaluation was largely consistent with scores across his prior evaluation in 2019. This does not suggest a rapidly progressing dementing illness, which is also consistent with a more vascular presentation. Test performance is also consistent with his history of parkinsonism symptoms, given deficits in processing speed, executive functioning, and visuospatial abilities. However, behavioral characteristics do not appear to strongly align with any known conditions.  Regarding memory, Henry Adams performance across delayed recall tasks were largely within appropriate ranges and retention rates were 100%. This does not suggest the presence of an underlying  neurodegenerative condition primarily affecting memory processes (e.g., Alzheimer's disease).   There are additional factors which can create and maintain cognitive inefficiencies. These include Henry Adams ongoing hearing loss, symptoms of fatigue which led to variable test engagement towards the end of the battery, and mild levels of psychiatric distress. It is likely that a combination of these factors negatively influence Henry Adams day-to-day cognitive functioning.  Recommendations: -Henry Adams reported mild psychiatric distress in the form of both anxiety and depression. He stated that he is scheduled to meet with a individual therapist/counselor later in the week to re-initiate these services (they were largely discontinued due to the COVID-19 pandemic). He is encouraged to continue and engage in these services as symptoms of anxiety and depression have been shown to compromise cognitive efficiency.   -For day-to-day problems recalling information, he may benefit from using strategies to aid with his learning and memory, such as asking questions for clarification, requesting that information to be repeated, or repeating an explanation in his own words to ensure comprehension and promote encoding.  -When learning new information, he would benefit from information being broken up into small, manageable pieces. He may also find it helpful to articulate the material in his own words and in a context to promote encoding at the onset of a new task. This material may need to be repeated multiple times to promote encoding.  To address problems with processing speed, he may wish to consider:   -Scheduling more difficult activities for a time of day when he is usually most alert   -Planning shorter activities and outings   -Ensuring that he is alerted when essential material or instructions are being presented   -Adjusting the speed at which new information is presented   -Allowing additional  processing time or a chance to rehearse novel information   -Allowing for more time in comprehending, processing, and responding in conversation   -  Repeating and paraphrasing instructions or conversations aloud  Given deficits in expressive language, it may be difficult for him to provide lengthy responses to inquiries. However, he was able to produce short phrases accurately. Therefore, providing him with written multiple choice options when asking questions or asking closed questions which require only brief responses will maximize his ability to communicate effectively.  -It will be important to have him paraphrase back information rather than simply repeat to allow those working with him to ensure he understands what is being asked of him and/or told to him.  Review of Records:   Henry Adams completed a comprehensive neuropsychological evaluation on 05/23/2017 Kandis Nab, Psy.D.). At that time, results were said to largely be within normal limits. A few impaired performances were exhibited; however, due to behavioral observations, these were attributed to variable effort/test engagement with possible psychological interference. Overall, Dr, Bonita Quin suspected that cognitive symptoms were due to significant psychosocial stressors which was causing adjustment-related anxiety and depression. While she noted the possibility of some mild cognitive impairment beyond what could be explained by stress/mood factors, she reported no evidence of frank dementia.   Henry Adams was most recently seen by [facility] Wells Guiles Tat, D.O.) on 07/23/2018 for follow-up of parkinsonism, memory loss, depression, and various medical comorbidities. At that time, Henry Adams acknowledged symptoms of depression, emphasizing feeling isolated from his wife who currently resides in a memory care facility with visitor restrictions. He reported attending counseling services prior to the COVID-19 pandemic with minimal  reported benefit. Medication adjustments were performed. Performance on a brief cognitive screening instrument [facility]) was in the abnormal range (15/30); points were lost across domains of visuospatial/executive (2/5), confrontation naming (2/3), attention (2/3), sentence repetition (0/2), phonemic fluency (0/1), verbal abstraction (1/2), delayed recall (0/5), and orientation (5/6). This performance was diminished relative to a previous MOCA score of 20/30 on 02/04/2017. Subsequently, Henry Adams was referred for a comprehensive neuropsychological evaluation to assess cognitive changes over time and to assist with future treatment planning and diagnostic clarity.  Brain MRI on 04/25/2015 revealed chronic microvascular ischemic changes, but no acute infarct or mass. Head CT on 08/17/2017 revealed mild chronic small vessel ischemic changes, but no acute intracranial abnormality. Brain MRI on 12/11/2017 revealed age-related atrophy and mild to moderate chronic small vessel ischemic changes, slightly progressive since 2017. Head CT on 06/16/2018 revealed stable atrophy with periventricular small vessel disease. A stable subcentimeter meningioma in the posterior foramen magnum region without mass effect or edema was also noted.   Past Medical History:  Diagnosis Date   Allergy    perennial   Anxiety    Arthritis    Cholelithiasis    Chronic idiopathic constipation    CKD (chronic kidney disease)    Depression    Diverticulosis    Enlarged prostate    Gallstones    GERD (gastroesophageal reflux disease)    HLD (hyperlipidemia)    Hx of cardiovascular stress test    ETT-Myoview 6/14: probable low risk study, inf defect with normal inf wall motion-cannot r/o ischemia; prominent gut uptake adjacent to inf wall on rest images-poss defect is atten, EF 63%   Hypertension    Melanoma ([place])    Parkinsonism ([place])    Polycystic kidney disease     Past Surgical History:  Procedure Laterality Date    COLONOSCOPY  2011   diverticulosis   HERNIA REPAIR     umbilical   KNEE SURGERY Left    medialization laryngoplasty  10/15/2013  Dr Joya Gaskins, [facility]   TONSILLECTOMY AND ADENOIDECTOMY      Family History  Problem Relation Age of Onset   Hypertension Father    Cancer Brother        mouth   Cancer Sister        breast   Heart disease Brother        heart transplant   Cancer Daughter        breast   Stroke Sister        >65   Skin cancer Brother        squamous cell   Colon cancer Neg Hx      Current Outpatient Medications:    acetaminophen (TYLENOL) 500 MG tablet, Take 1,000 mg by mouth every 6 (six) hours as needed for moderate pain or fever., Disp: , Rfl:    alfuzosin (UROXATRAL) 10 MG 24 hr tablet, Take 1 tablet (10 mg total) by mouth daily with breakfast., Disp: 90 tablet, Rfl: 1   AMITIZA 24 MCG capsule, TAKE 1 CAPSULE TWICE DAILY WITH MEALS., Disp: 60 capsule, Rfl: 0   buPROPion (WELLBUTRIN XL) 150 MG 24 hr tablet, Take 1 tablet (150 mg total) by mouth daily., Disp: 90 tablet, Rfl: 1   carbidopa-levodopa (SINEMET IR) 25-100 MG tablet, TAKE 1 TABLET AT 7AM, 11AM, 3PM AND TAKE 1&1/2 TABLETS AT 7PM., Disp: 135 tablet, Rfl: 1   Carbidopa-Levodopa ER (SINEMET CR) 25-100 MG tablet controlled release, TAKE ONE TABLET AT BEDTIME., Disp: 90 tablet, Rfl: 1   clonazePAM (KLONOPIN) 0.5 MG tablet, Take 1 tablet (0.5 mg total) by mouth at bedtime., Disp: 90 tablet, Rfl: 1   clotrimazole (MYCELEX) 10 MG troche, Take 1 tablet (10 mg total) by mouth 3 (three) times daily., Disp: 90 tablet, Rfl: 1   docusate sodium (COLACE) 100 MG capsule, Take 100 mg by mouth daily., Disp: , Rfl:    donepezil (ARICEPT) 5 MG tablet, Take 1 tablet (5 mg total) by mouth at bedtime., Disp: 90 tablet, Rfl: 1   fexofenadine (ALLEGRA) 180 MG tablet, Take 180 mg by mouth daily.  , Disp: , Rfl:    finasteride (PROSCAR) 5 MG tablet, Take 5 mg by mouth daily., Disp: , Rfl:    omeprazole  (PRILOSEC) 40 MG capsule, TAKE (1) CAPSULE DAILY., Disp: 90 capsule, Rfl: 0   Polyethylene Glycol 3350 (MIRALAX PO), Take 17 g by mouth daily. , Disp: , Rfl:    vitamin B-12 (CYANOCOBALAMIN) 1000 MCG tablet, Take 1,000 mcg by mouth daily., Disp: , Rfl:   Clinical Interview:   Cognitive Symptoms: Decreased short-term memory: Endorsed. Mr. Crosson described memory deficits as information "going in one ear and out the other," as well as trouble remembering various pieces of information well enough to put together. He noted that these deficits have been present for the past 40 years, exhibiting a slight, gradual decline over that time. Additionally, he noted that difficulties have recently been exacerbated by various psychosocial stressors and medical conditions. Decreased long-term memory: Denied. Decreased attention/concentration: Denied. Increased ease of distractibility: Denied. Reduced processing speed: Endorsed. These difficulties were described as mild in nature. Difficulties with executive functions: Denied. Difficulties with emotion regulation: Denied. Difficulties with receptive language: Endorsed. However, deficits were largely attributed to ongoing hearing loss and difficulties with short-term memory and processing speed. Difficulties with word finding: Endorsed. These were also attributed to memory deficits in which Mr. Griswold cannot remember the word he wishes to use.  Decreased visuoperceptual ability: Denied.  Difficulties completing ADLs: Denied.  Mr. Silvera also reported currently driving with no concerns.  Additional Medical History: History of traumatic brain injury/concussion: Denied. History of stroke: Denied. History of seizure activity: Denied. History of known exposure to toxins: Denied. Symptoms of chronic pain: Endorsed. Symptoms were largely attributed to various medical conditions (e.g., polycystic kidney disease) and medication side effects. Pain was largely  localized to his feet and ankles and was described as manageable overall. Experience of frequent headaches/migraines: Denied. Frequent instances of dizziness/vertigo: Endorsed. He reported consistently waking up with symptoms of dizziness, which last the majority of the morning (i.e., generally until noon). These symptoms were said to be mild in nature and do not dramatically impact his mobility while present. A history of falls was denied.   Sensory changes: Endorsed. Mr. Clinch reported utilizing hearing aids to treat ongoing hearing loss with generally positive effect. Other sensory changes/difficulties (e.g., vision, taste, smell) were denied.  Balance/coordination difficulties: Denied. Other motor difficulties: Endorsed. He reported having several "pieces of Parkinson's," including difficulty with tasks which require fine motor skills (e.g., buttoning shirts or picking up smaller objects). A slight bilateral tremor was also reported when carrying small objects.   Sleep History: Estimated hours obtained each night: 6-8 hours.  Difficulties falling asleep: Denied. Difficulties staying asleep: Endorsed. He reported waking up 3-4 times each night to use the restroom and is generally able to fall back asleep quickly. However, he did allude to rare instances more recently in which he will have trouble falling asleep due to the presence of racing thoughts and mild anxiety.  Feels rested and refreshed upon awakening: Largely endorsed. Specifically, he stated that he "doesn't feel tired" after awakening, but noted that his symptoms of dizziness "take over" and become most salient for him.   History of snoring: Denied. History of waking up gasping for air: Denied. Witnessed breath cessation while asleep: Denied.  History of vivid dreaming: Denied. Excessive movement while asleep: Denied. Instances of acting out his dreams: Denied.  Psychiatric/Behavioral Health History: Depression: Endorsed. He  reported a history of "slight" depression. He noted currently taking anti-depressant medication with positive effect and perceived minimal side effects. Additionally, he described the presence of several significant psychosocial stressors centering on his wife's health (she currently resides in a memory care facility and has been diagnosed with dementia) and the isolation he has been feeling due to visitation restrictions in response to the ongoing COVID-19 pandemic. He did not provide tangible coping strategies for dealing with his distress other than staying that he "pushes through it" and has been working on gaining increased access to his wife. Current or remote suicidal ideation, intent, or plan was denied. Anxiety: Denied. However, as described above, he did allude to rare instances in which anxious thoughts keep him awake.  Mania: Denied. Trauma History: Denied. Visual/auditory hallucinations: Denied. Delusional thoughts: Denied. Mental health treatment: Endorsed. In addition to medication treatment, he also reported previously engaging with an individual therapist/counselor. While he did not report notable benefits from meeting with this person, he did state that he is scheduled to meet with a provider later this week to re-initiate these services (they were largely discontinued due to the COVID-19 pandemic).   Tobacco: Denied. Alcohol: Mr. Colborn denied current alcohol consumption. Likewise, he denied a history of problematic alcohol use, abuse, or dependence.  Recreational drugs: Denied. Caffeine: Endorsed. However, consumption was described as very rare as he reported consuming a cup of coffee 2-3 times per month.  Academic/Vocational History: Highest level of educational attainment: 16  years. He completed high school and graduated with a Water quality scientist degree in Estate manager/land agent from [employer]. He described himself as a Production designer, theatre/television/film in academic settings.  History  of developmental delay: Denied. History of grade repetition: Denied. History of class failures: Denied. Enrollment in special education courses: Denied. History of diagnosed specific learning disability: Denied. History of ADHD: Denied.  Employment: Retired. 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 He received the [facility] for his contributions.    Evaluation Results:   Behavioral Observations: Henry Adams was unaccompanied, arrived to his appointment on time, and was appropriately dressed and groomed. At the start of the interview, he reported believing that his hearing aids "gave out." After removing his hearing aids, the clinical interview was able to be completed. However, hearing loss was notable and several questions needed to be repeated to ensure adequate comprehension. Observed gait and station were largely within normal limits; however, his gait was slowed and mildly unsteady at times. Gross motor functioning appeared intact upon informal observation. Mild tremors in his upper extremities were observed when performing fine motor movements. His affect was generally relaxed and positive, but did range appropriately given the subject being discussed and he did briefly become tearful when discussing being isolated from his wife. Spontaneous speech was fluent. However, word finding difficulties were observed during the clinical interview.   During testing, Henry Adams hearing difficulties required several task instructions to be repeated to ensure adequate comprehension. Sustained attention was generally adequate; however, he did noticeably fatigue mid-way through the evaluation. Thought processes were coherent, organized, and normal in content.  Task engagement was adequate; however, he did fatigue as the evaluation progressed. As such, the current evaluation was shortened in response. Interpersonally, Henry Adams was appropriate and pleasant overall. He did exhibit brief agitation coinciding with increasing symptoms of fatigue. Overall, Henry Adams was cooperative with the clinical interview and subsequent testing procedures.   Adequacy of Effort: The validity of neuropsychological testing is limited by the extent to which the individual being tested may be assumed to have exerted adequate effort during testing. Mr. Cuda expressed his intention to perform to the best of his abilities and exhibited adequate task engagement and persistence. Scores across embedded performance validity measures were within expectation. As such, the results of the current evaluation are believed to be a valid representation of Mr. Claiborne current cognitive functioning.  Test Results: Mr. Kauffmann was generally oriented. Points were lost for incorrectly stating his birthday ([birth year]) and the current date (0/2).  Intellectual abilities based upon educational and vocational attainment were estimated to be in the average range. Premorbid abilities were estimated to be within the average range based upon a single-word reading test (TOPF).   Processing speed was variable, ranging from the exceptionally low to below average ranges, representing a weakness across the current cognitive profile. Basic attention was in the well below average range. More complex attention (e.g., working memory) was in the below average range. Performance across tasks assessing executive functions (e.g., cognitive flexibility and abstract reasoning) were variable, ranging from the exceptionally low to average normative ranges. A strength was exhibited across a task assessing verbal abstract reasoning.  While not directly assessed, receptive language abilities are believed to be intact assuming that  Mr. Lascola is able  to hear information adequately and it can be repeated to ensure comprehension. Upon providing further repetitions, he did not exhibit any difficulties comprehending task instructions and answered all questions asked of him appropriately. Assessed expressive language (e.g., verbal fluency and confrontation naming) was impaired, representing a weakness across the current cognitive profile.     Assessed visuospatial/visuoconstructional abilities were variable. Points were lost while drawing a clock due to poor spatial arrangement of the numbers, as well as placing the clock hands at the incorrect time. Performance on a task requiring Mr. Trompeter to match visual stimuli was in the exceptionally low range.   Learning (i.e., encoding) of novel verbal and visual information was variable, ranging from the well below average to average normative ranges. Spontaneous delayed recall (i.e., retrieval) of previously learned information was generally commensurate with performance across initial learning trials. Retention rates were strong across memory measures. Performance across recognition tasks was likewise strong, suggesting evidence for appropriate information consolidation.   Results of emotional screening instruments suggested that recent symptoms of generalized anxiety were in the mild range, while symptoms of depression were also within the mild range.   Tables of Scores:         Note: This summary of test scores accompanies the interpretive report and should not be considered in isolation without reference to the appropriate sections in the text. Descriptors are based on appropriate normative data and may be adjusted based on clinical judgment. The terms impaired and within normal limits (WNL) are used when a more specific level of functioning cannot be determined. Descriptors refer to the current evaluation only.        Effort Testing:    DESCRIPTOR   2019 Current    CVLT-III Brief  Form Forced Choice Recognition: --- --- --- Within Expectation  BVMT-R Retention Percentage: --- --- --- Within Expectation        Orientation:       T Score Raw Score Percentile   NAB Orientation, Form 1 --- 26/29 <1 Exceptionally Low        General Intellectual Functioning:             Standard Score Standard Score Percentile   Test of Premorbid Functioning (TOPF): 104 95 37 Average        Memory:            [place] Test (CVLT-III) Brief Form: Raw Score (Z-Score) Raw Score (Scaled/Standard Score) Percentile     Total Trials 1-4 16/36 (29 T) 16/36 (77) 6 Well Below Average    Short-Delay Free Recall 4/9 (-1.5) 6/9 (9) 9 Average    Long-Delay Free Recall 4/9 (-0.5) 4/9 (7) 16 Below Average    Long-Delay Cued Recall 4/9 (-0.5) 5/9 (6) 9 Below Average      Recognition Hits 9/9 (1) 8/9 (10) 50 Average      False Positive Errors 0 (---) 0 (13) 84 Above Average        Brief Visuospatial Memory Test (BVMT-R), Form 1: T Score Raw Score (T Score) Percentile     Total Trials 1-3 --- 13/36 (48) 42 Average    Delayed Recall --- 5/12 (47) 38 Average    Recognition Discrimination Index --- 6 (57) 75 Above Average      Recognition Hits --- 6/6 (56) 73 Average      False Positive Errors --- 0 (54) 66 Average  *From Duff (2016)            Attention/Executive Function:  [place]il [place] Test (TMT): Raw Score (T Score) Raw Score (T Score) Percentile     Part A 59 secs. (43) 57 secs., 0 errors (37) 9 Below Average    Part B 193 secs. (35) 206 secs., 1 error (39) 14 Below Average        NAB Attention Module, Form 1: T Score T Score Percentile     Digits Forward --- 32 4 Well Below Average    Digits Backwards --- 41 18 Below Average         Scaled Score Scaled Score Percentile   WAIS-IV Similarities: 8 9 37 Average        Language:            Verbal Fluency Test: Raw Score (T Score) Raw Score (T Score) Percentile     Phonemic Fluency (FAS) 13 (26) 17 (28) 2  Exceptionally Low    Animal Fluency 10 (30) 6 (20) <1 Exceptionally Low               NAB Language Module, Form 1: T Score T Score Percentile     Naming --- 19 <1 Exceptionally Low        Visuospatial/Visuoconstruction:       Raw Score Raw Score Percentile   Clock Drawing: WNL 7/10 --- Within Normal Limits        NAB Spatial Module, Form 1: T Score T Score Percentile     Visual Discrimination --- 26 1 Exceptionally Low         Scaled Score Scaled Score Percentile   WAIS-IV Matrix Reasoning: --- 7 16 Below Average        Mood and Personality:       Raw Score Raw Score Percentile   Geriatric Depression Scale: --- 15 --- Mild  Geriatric Anxiety Scale: --- 13 --- Mild    Somatic --- 4 --- Minimal    Cognitive --- 5 --- Mild    Affective --- 4 --- Mild   Informed Consent and Coding/Compliance:   Mr. Strothers was provided with a verbal description of the nature and purpose of the present neuropsychological evaluation. Also reviewed were the foreseeable risks and/or discomforts and benefits of the procedure, limits of confidentiality, and mandatory reporting requirements of this provider. The patient was given the opportunity to ask questions and receive answers about the evaluation. Oral consent to participate was provided by the patient.   This evaluation was conducted by Christia Reading, Ph.D., licensed clinical neuropsychologist. Mr. Willcutt completed a 30-minute clinical interview, billed as one unit [phone], and 135 minutes of cognitive testing, billed as one unit [phone] and four additional units [phone]. Psychometrist Milana Kidney, B.S., assisted Dr. Melvyn Novas with testing and scoring procedures. As a separate and discrete service, Dr. Melvyn Novas spent a total of 180 minutes in interpretation and report writing, billed as one unit [id number] and two units 96133.

## 2018-09-04 ENCOUNTER — Ambulatory Visit (INDEPENDENT_AMBULATORY_CARE_PROVIDER_SITE_OTHER): Payer: Medicare Other | Admitting: Psychology

## 2018-09-04 DIAGNOSIS — F3289 Other specified depressive episodes: Secondary | ICD-10-CM

## 2018-09-09 ENCOUNTER — Ambulatory Visit (INDEPENDENT_AMBULATORY_CARE_PROVIDER_SITE_OTHER): Payer: Medicare Other | Admitting: Psychology

## 2018-09-09 ENCOUNTER — Other Ambulatory Visit: Payer: Self-pay

## 2018-09-09 DIAGNOSIS — G3184 Mild cognitive impairment, so stated: Secondary | ICD-10-CM

## 2018-09-09 NOTE — Progress Notes (Signed)
   NEUROPSYCHOLOGICAL EVALUATION - Feedback . [facility] Department of Neurology  Reason for Referral:   Henry Adams a 78 y.o. Caucasian male referred by Alonza Bogus, D.O.,to characterize hiscurrent cognitive functioning and assist with diagnostic clarity and treatment planning in the context of the presence of parkinsonisms, subjective cognitive decline, and several medical and psychiatric comorbidities.  Feedback:   Henry Adams completed a comprehensive neuropsychological evaluation on 09/01/2018. Briefly, results suggested notable performance variability, with primary areas of impairment surrounding processing speed and expressive language. Additional weaknesses were observed across domains of executive functioning and visuospatial abilities. 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 The etiology for these deficits was said to appear most consistent with his history of mild to moderate small vessel ischemic changes, which have progressed since 2017. Additionally, his performance on the current evaluation was largely consistent with scores across his prior evaluation in 2019. This does not suggest a rapidly progressing dementing illness, which is also consistent with a more vascular presentation. Recommendations included engaging with an outpatient therapist to address mild symptoms of psychiatric distress (e.g., anxiety and depression).  Henry Adams was accompanied by his daughter. Content of the current session focused on possible causes for Henry Adams cognitive dysfunction, especially those surrounding cerebrovascular disease and the influence of anxiety, depression, and stress. Henry Adams and his daughter were given the opportunity to ask questions and all their  questions were answered. He was also encouraged to reach out should additional questions arise.     A total of 40 minutes were spent with Henry Adams and his daughter during the current feedback session.

## 2018-09-10 ENCOUNTER — Ambulatory Visit: Payer: Self-pay

## 2018-09-10 NOTE — Telephone Encounter (Signed)
Pt daughter called stating that her dad has been waking every morning stating that he feels lightheaded and his stomach hurts. No nausea.  She states that the symptoms last from about 6am-1200 then subside. She has tried changes in morning diet and time changes with his daily medications. Nothing is helping. She states that he usually has constipation and takes medication for that but over a few days he has reported diarrhea stools. He states that he just feels weak. He denies weakness on one side of the body. Care advice read to patient and his daughter. They verbalized understanding. Call transferred to office for scheduling.  Reason for Disposition . [1] MODERATE dizziness (e.g., interferes with normal activities) AND [2] has NOT been evaluated by physician for this  (Exception: dizziness caused by heat exposure, sudden standing, or poor fluid intake)  Answer Assessment - Initial Assessment Questions 1. DESCRIPTION: "Describe your dizziness."     lighheaded 2. LIGHTHEADED: "Do you feel lightheaded?" (e.g., somewhat faint, woozy, weak upon standing)     Sometimes very weak 3. VERTIGO: "Do you feel like either you or the room is spinning or tilting?" (i.e. vertigo)    no 4. SEVERITY: "How bad is it?"  "Do you feel like you are going to faint?" "Can you stand and walk?"   - MILD - walking normally   - MODERATE - interferes with normal activities (e.g., work, school)    - SEVERE - unable to stand, requires support to walk, feels like passing out now.      moderate 5. ONSET:  "When did the dizziness begin?"     3 weeks 6. AGGRAVATING FACTORS: "Does anything make it worse?" (e.g., standing, change in head position)     Standing not comfortable walking  7. HEART RATE: "Can you tell me your heart rate?" "How many beats in 15 seconds?"  (Note: not all patients can do this)      BP  8. CAUSE: "What do you think is causing the dizziness?"     unsure 9. RECURRENT SYMPTOM: "Have you had dizziness  before?" If so, ask: "When was the last time?" "What happened that time?"     121/80  78 10. OTHER SYMPTOMS: "Do you have any other symptoms?" (e.g., fever, chest pain, vomiting, diarrhea, bleeding)       Diarrhea and constipation 11. PREGNANCY: "Is there any chance you are pregnant?" "When was your last menstrual period?"       N/A  Protocols used: DIZZINESS [facility]

## 2018-09-11 ENCOUNTER — Other Ambulatory Visit: Payer: Self-pay

## 2018-09-11 ENCOUNTER — Ambulatory Visit (INDEPENDENT_AMBULATORY_CARE_PROVIDER_SITE_OTHER): Payer: Medicare Other | Admitting: Internal Medicine

## 2018-09-11 ENCOUNTER — Other Ambulatory Visit: Payer: Self-pay | Admitting: Gastroenterology

## 2018-09-11 ENCOUNTER — Ambulatory Visit (INDEPENDENT_AMBULATORY_CARE_PROVIDER_SITE_OTHER)
Admission: RE | Admit: 2018-09-11 | Discharge: 2018-09-11 | Disposition: A | Discharge status: Home / Self Care | Payer: Medicare Other | Source: Ambulatory Visit | Attending: Internal Medicine | Admitting: Internal Medicine

## 2018-09-11 ENCOUNTER — Other Ambulatory Visit: Payer: Self-pay | Admitting: Neurology

## 2018-09-11 ENCOUNTER — Encounter: Payer: Self-pay | Admitting: Internal Medicine

## 2018-09-11 VITALS — BP 138/80 | HR 72 | Temp 97.9°F | Resp 16 | Ht 71.0 in | Wt 155.5 lb

## 2018-09-11 DIAGNOSIS — R1084 Generalized abdominal pain: Secondary | ICD-10-CM

## 2018-09-11 DIAGNOSIS — R109 Unspecified abdominal pain: Secondary | ICD-10-CM | POA: Diagnosis not present

## 2018-09-11 DIAGNOSIS — Z23 Encounter for immunization: Secondary | ICD-10-CM | POA: Diagnosis not present

## 2018-09-11 NOTE — Patient Instructions (Signed)
Abdominal Pain, Adult Abdominal pain can be caused by many things. Often, abdominal pain is not serious and it gets better with no treatment or by being treated at home. However, sometimes abdominal pain is serious. Your health care provider will do a medical history and a physical exam to try to determine the cause of your abdominal pain. Follow these instructions at home:  Take over-the-counter and prescription medicines only as told by your health care provider. Do not take a laxative unless told by your health care provider.  Drink enough fluid to keep your urine clear or pale yellow.  Watch your condition for any changes.  Keep all follow-up visits as told by your health care provider. This is important. Contact a health care provider if:  Your abdominal pain changes or gets worse.  You are not hungry or you lose weight without trying.  You are constipated or have diarrhea for more than 2-3 days.  You have pain when you urinate or have a bowel movement.  Your abdominal pain wakes you up at night.  Your pain gets worse with meals, after eating, or with certain foods.  You are throwing up and cannot keep anything down.  You have a fever. Get help right away if:  Your pain does not go away as soon as your health care provider told you to expect.  You cannot stop throwing up.  Your pain is only in areas of the abdomen, such as the right side or the left lower portion of the abdomen.  You have bloody or black stools, or stools that look like tar.  You have severe pain, cramping, or bloating in your abdomen.  You have signs of dehydration, such as: ? Dark urine, very little urine, or no urine. ? Cracked lips. ? Dry mouth. ? Sunken eyes. ? Sleepiness. ? Weakness. This information is not intended to replace advice given to you by your health care provider. Make sure you discuss any questions you have with your health care provider. Document Released: 10/18/2004 Document  Revised: 07/29/2015 Document Reviewed: 06/22/2015 Elsevier Interactive Patient Education  2020 [employer]  

## 2018-09-11 NOTE — Progress Notes (Signed)
Subjective:  Patient ID: Henry Adams, male    DOB: May 27, 1940  Age: 78 y.o. MRN: [id number]  CC: Abdominal Pain   HPI [name] presents for f/up - He started taking Aricept about 5 weeks ago and now complains of a 3-week history of dizziness, diarrhea, nausea, indigestion, and abdominal bloating.  Outpatient Medications Prior to Visit  Medication Sig Dispense Refill  . acetaminophen (TYLENOL) 500 MG tablet Take 1,000 mg by mouth every 6 (six) hours as needed for moderate pain or fever.    [name] alfuzosin (UROXATRAL) 10 MG 24 hr tablet Take 1 tablet (10 mg total) by mouth daily with breakfast. 90 tablet 1  . buPROPion (WELLBUTRIN XL) 150 MG 24 hr tablet Take 1 tablet (150 mg total) by mouth daily. 90 tablet 1  . Carbidopa-Levodopa ER (SINEMET CR) 25-100 MG tablet controlled release TAKE ONE TABLET AT BEDTIME. 90 tablet 1  . clonazePAM (KLONOPIN) 0.5 MG tablet Take 1 tablet (0.5 mg total) by mouth at bedtime. 90 tablet 1  . clotrimazole (MYCELEX) 10 MG troche Take 1 tablet (10 mg total) by mouth 3 (three) times daily. 90 tablet 1  . docusate sodium (COLACE) 100 MG capsule Take 100 mg by mouth daily.    . fexofenadine (ALLEGRA) 180 MG tablet Take 180 mg by mouth daily.      . finasteride (PROSCAR) 5 MG tablet Take 5 mg by mouth daily.    [name] omeprazole (PRILOSEC) 40 MG capsule TAKE (1) CAPSULE DAILY. 90 capsule 0  . Polyethylene Glycol 3350 (MIRALAX PO) Take 17 g by mouth daily.     . vitamin B-12 (CYANOCOBALAMIN) 1000 MCG tablet Take 1,000 mcg by mouth daily.    . AMITIZA 24 MCG capsule TAKE 1 CAPSULE TWICE DAILY WITH MEALS. 60 capsule 0  . carbidopa-levodopa (SINEMET IR) 25-100 MG tablet TAKE 1 TABLET AT 7AM, 11AM, 3PM AND TAKE 1&1/2 TABLETS AT 7PM. 135 tablet 1  . donepezil (ARICEPT) 5 MG tablet Take 1 tablet (5 mg total) by mouth at bedtime. 90 tablet 1   No facility-administered medications prior to visit.     ROS Review of Systems  Constitutional: Positive for appetite change.  Negative for chills, diaphoresis, fatigue, fever and unexpected weight change.  HENT: Negative.  Negative for trouble swallowing.   Eyes: Negative.   Respiratory: Negative for cough, chest tightness, shortness of breath and wheezing.   Cardiovascular: Negative for chest pain, palpitations and leg swelling.  Gastrointestinal: Positive for abdominal pain, diarrhea and nausea. Negative for blood in stool, constipation and vomiting.  Endocrine: Negative.   Genitourinary: Negative.  Negative for difficulty urinating.  Musculoskeletal: Negative.   Skin: Negative.  Negative for color change.  Neurological: Positive for dizziness. Negative for weakness, light-headedness and headaches.  Hematological: Negative for adenopathy. Does not bruise/bleed easily.  Psychiatric/Behavioral: Negative.     Objective:  BP 138/80 (BP Location: Left Arm, Patient Position: Sitting, Cuff Size: Normal)   Pulse 72   Temp 97.9 F (36.6 C) (Oral)   Resp 16   Ht '5\' 11"'  (1.803 m)   Wt 155 lb 8 oz (70.5 kg)   SpO2 97%   BMI 21.69 kg/m   BP Readings from Last 3 Encounters:  09/11/18 138/80  06/25/18 130/80  06/19/18 119/71    Wt Readings from Last 3 Encounters:  09/11/18 155 lb 8 oz (70.5 kg)  07/23/18 156 lb (70.8 kg)  06/26/18 157 lb (71.2 kg)    Physical Exam Vitals signs reviewed.  Constitutional:      General: He is not in acute distress.    Appearance: He is not ill-appearing, toxic-appearing or diaphoretic.  HENT:     Mouth/Throat:     Mouth: Mucous membranes are moist.  Eyes:     General: No scleral icterus.    Conjunctiva/sclera: Conjunctivae normal.  Neck:     Musculoskeletal: Normal range of motion. No neck rigidity or muscular tenderness.  Cardiovascular:     Rate and Rhythm: Normal rate and regular rhythm.     Heart sounds: No murmur.  Pulmonary:     Effort: Pulmonary effort is normal. No respiratory distress.     Breath sounds: No wheezing, rhonchi or rales.  Abdominal:      General: Abdomen is protuberant. Bowel sounds are normal. There is no distension.     Palpations: Abdomen is soft. There is no hepatomegaly, splenomegaly or mass.     Tenderness: There is no guarding.  Musculoskeletal: Normal range of motion.     Right lower leg: No edema.     Left lower leg: No edema.  Lymphadenopathy:     Cervical: No cervical adenopathy.  Skin:    General: Skin is warm and dry.     Findings: No rash.  Neurological:     Mental Status: He is alert and oriented to person, place, and time. Mental status is at baseline.     Lab Results  Component Value Date   WBC 8.2 06/25/2018   HGB 13.2 06/25/2018   HCT 38.9 (L) 06/25/2018   PLT 290.0 06/25/2018   GLUCOSE 117 (H) 06/25/2018   CHOL 179 11/14/2016   TRIG 73.0 11/14/2016   HDL 68.50 11/14/2016   LDLDIRECT 131.7 07/02/2011   LDLCALC 96 11/14/2016   ALT 18 06/19/2018   AST 40 06/19/2018   NA 137 06/25/2018   K 4.1 06/25/2018   CL 102 06/25/2018   CREATININE 1.50 06/25/2018   BUN 25 (H) 06/25/2018   CO2 28 06/25/2018   TSH 1.36 06/25/2018   PSA 2.28 08/11/2015   INR 1.1 (H) 08/01/2012   HGBA1C 5.9 12/29/2009    Dg Chest 2 View  Result Date: 06/16/2018 CLINICAL DATA:  Fever for 1 day. EXAM: CHEST - 2 VIEW COMPARISON:  Chest x-ray dated 03/27/2018 FINDINGS: Heart size and mediastinal contours are within normal limits. Lungs are hyperexpanded. Lungs are clear. No pleural effusion seen. No acute or suspicious osseous finding. IMPRESSION: 1. No active cardiopulmonary disease. No evidence of pneumonia or pulmonary edema. 2. Hyperexpanded lungs indicating COPD. Electronically Signed   By: Franki Cabot M.D.   On: 06/16/2018 10:24   Ct Head Wo Contrast  Result Date: 06/16/2018 CLINICAL DATA:  Fever and altered mental status EXAM: CT HEAD WITHOUT CONTRAST TECHNIQUE: Contiguous axial images were obtained from the base of the skull through the vertex without intravenous contrast. COMPARISON:  Head CT August 17, 2017 and  brain MRI December 11, 2017; earlier brain MRI April 25, 2015 FINDINGS: Brain: Mild diffuse atrophy is stable. There is a stable meningioma at the posterior foramen magnum level measuring 8 x 8 mm. This small meningioma does not show evident mass effect or edema. No other mass is evident. There is no hemorrhage, extra-axial fluid collection, or midline shift. There is patchy small vessel disease in the centra semiovale bilaterally. There is no evident acute infarct. Vascular: No hyperdense vessel. There is calcification in each carotid siphon region. Skull: The bony calvarium appears intact. Sinuses/Orbits: There is mucosal thickening in  several ethmoid air cells. Other visualized paranasal sinuses are clear. Visualized orbits appear symmetric bilaterally. Other: Mastoid air cells are clear. IMPRESSION: Stable atrophy with periventricular small vessel disease. No evident acute infarct. Stable subcentimeter meningioma in the posterior foramen magnum region without mass effect or edema. No new mass evident. No hemorrhage. There are foci of arterial vascular calcification. There is mucosal thickening in several ethmoid air cells. Electronically Signed   By: Lowella Grip III M.D.   On: 06/16/2018 21:57   Dg Chest Port 1 View  Result Date: 06/16/2018 CLINICAL DATA:  Fever EXAM: PORTABLE CHEST 1 VIEW COMPARISON:  06/16/2018 FINDINGS: The cardiac silhouette is stable. There is no pneumothorax. No large pleural effusion. No significant area of consolidation. The lungs are somewhat hyperexpanded. IMPRESSION: No active disease. Electronically Signed   By: Constance Holster M.D.   On: 06/16/2018 22:05   Ct Renal Stone Study  Result Date: 06/16/2018 CLINICAL DATA:  Fever.  Confusion. EXAM: CT ABDOMEN AND PELVIS WITHOUT CONTRAST TECHNIQUE: Multidetector CT imaging of the abdomen and pelvis was performed following the standard protocol without IV contrast. COMPARISON:  CT dated 07/01/2006. FINDINGS: Lower chest: No  acute abnormality. Hepatobiliary: Innumerable cysts are noted in the liver. The gallbladder is unremarkable. Pancreas: The pancreas is suboptimally evaluated in the absence of IV contrast. There is no definite pancreatic mass. Spleen: Normal in size without focal abnormality. Adrenals/Urinary Tract: There are innumerable large cysts throughout both kidneys. Both kidneys are enlarged. There is some fullness of both collecting systems without evidence of an obstructing stone. The bladder is decompressed which limits evaluation. Stomach/Bowel: The bowel is suboptimally evaluated secondary to lack of both oral and IV contrast. Additionally much of the bowel is compressed secondary to the patient's enlarged kidneys. Given these limitations, no definite abnormality is detected. There is no small bowel obstruction. The appendix is not well identified. Vascular/Lymphatic: Aortic atherosclerosis. No enlarged abdominal or pelvic lymph nodes. Reproductive: Prostate gland is significantly enlarged. There is a trace amount of free fluid in the patient's pelvis. Other: No abdominal wall hernia or abnormality. No abdominopelvic ascites. Musculoskeletal: No acute or significant osseous findings. IMPRESSION: 1. Limited study as detailed above. 2. No definite acute abnormality detected. 3. Again identified are findings of polycystic kidney disease. Electronically Signed   By: Constance Holster M.D.   On: 06/16/2018 23:18    Dg Abd Acute 2+v W 1v Chest  Result Date: 09/11/2018 CLINICAL DATA:  Abdominal pain. EXAM: DG ABDOMEN ACUTE W/ 1V CHEST COMPARISON:  CT AP 06/16/2018 FINDINGS: There is no evidence of dilated bowel loops or free intraperitoneal air. No radiopaque calculi or other significant radiographic abnormality is seen. Heart size and mediastinal contours are within normal limits. Both lungs are clear. IMPRESSION: Negative abdominal radiographs.  No acute cardiopulmonary disease. Electronically Signed   By: Kerby Moors M.D.   On: 09/11/2018 17:16    Assessment & Plan:   Jakaree was seen today for abdominal pain.  Diagnoses and all orders for this visit:  Need for influenza vaccination -     Flu Vaccine QUAD High Dose(Fluad)  Generalized abdominal pain- He has a myriad of complaints.  His exam is reassuring.  Plain films of the abdomen and chest are normal.  I think his symptoms are caused by the initiation of donezepil.  I recommended that he stop taking it. -     DG ABD ACUTE 2+V W 1V CHEST; Future   I have discontinued Josie Saunders. Fosdick's donepezil. I  am also having him maintain his fexofenadine, vitamin B-12, Polyethylene Glycol 3350 (MIRALAX PO), clonazePAM, docusate sodium, acetaminophen, omeprazole, clotrimazole, alfuzosin, finasteride, Carbidopa-Levodopa ER, and buPROPion.  No orders of the defined types were placed in this encounter.    Follow-up: Return if symptoms worsen or fail to improve.  Scarlette Calico, MD

## 2018-09-12 NOTE — Telephone Encounter (Signed)
Requested Prescriptions   Pending Prescriptions Disp Refills  . carbidopa-levodopa (SINEMET IR) 25-100 MG tablet [Pharmacy Med Name: CARBIDOPA-LEVODOPA 25-100 TAB] 135 tablet 0    Sig: TAKE 1 TABLET AT 7AM, 11AM, 3PM AND TAKE 1&1/2 TABLETS AT 7PM.   Rx last filled: 07/14/18 #135 1 refills  Pt last seen: 07/23/18   Follow up appt scheduled:12/26/18

## 2018-09-18 ENCOUNTER — Other Ambulatory Visit: Payer: Self-pay | Admitting: Internal Medicine

## 2018-09-18 DIAGNOSIS — A048 Other specified bacterial intestinal infections: Secondary | ICD-10-CM

## 2018-09-18 DIAGNOSIS — K219 Gastro-esophageal reflux disease without esophagitis: Secondary | ICD-10-CM

## 2018-09-19 ENCOUNTER — Ambulatory Visit (INDEPENDENT_AMBULATORY_CARE_PROVIDER_SITE_OTHER): Payer: Medicare Other | Admitting: Psychology

## 2018-09-19 DIAGNOSIS — F3289 Other specified depressive episodes: Secondary | ICD-10-CM

## 2018-10-06 DIAGNOSIS — I129 Hypertensive chronic kidney disease with stage 1 through stage 4 chronic kidney disease, or unspecified chronic kidney disease: Secondary | ICD-10-CM | POA: Diagnosis not present

## 2018-10-06 DIAGNOSIS — Q613 Polycystic kidney, unspecified: Secondary | ICD-10-CM | POA: Diagnosis not present

## 2018-10-06 DIAGNOSIS — E785 Hyperlipidemia, unspecified: Secondary | ICD-10-CM | POA: Diagnosis not present

## 2018-10-06 DIAGNOSIS — N183 Chronic kidney disease, stage 3 (moderate): Secondary | ICD-10-CM | POA: Diagnosis not present

## 2018-10-08 ENCOUNTER — Ambulatory Visit: Payer: Medicare Other | Admitting: Psychology

## 2018-10-11 ENCOUNTER — Other Ambulatory Visit: Payer: Self-pay | Admitting: Gastroenterology

## 2018-10-11 ENCOUNTER — Other Ambulatory Visit: Payer: Self-pay | Admitting: Neurology

## 2018-10-13 ENCOUNTER — Other Ambulatory Visit: Payer: Self-pay

## 2018-10-13 MED ORDER — CLONAZEPAM 0.5 MG PO TABS: 0.5000 mg | ORAL_TABLET | Freq: Every day | ORAL | 1 refills | Status: DC

## 2018-10-13 NOTE — Telephone Encounter (Signed)
Requested Prescriptions   Pending Prescriptions Disp Refills  . clonazePAM (KLONOPIN) 0.5 MG tablet [Pharmacy Med Name: CLONAZEPAM 0.5 MG TABLET] 90 tablet 0    Sig: TAKE ONE TABLET AT BEDTIME.  . carbidopa-levodopa (SINEMET IR) 25-100 MG tablet [Pharmacy Med Name: CARBIDOPA-LEVODOPA 25-100 TAB] 135 tablet 0    Sig: TAKE 1 TABLET AT 7AM, 11AM, 3PM AND TAKE 1&1/2 TABLETS AT 7PM.   Rx last filled: 09/12/18 #135 0 refills 04/10/18 # 90 1 refills   Pt last seen: 07/23/18   Follow up appt scheduled: 12/26/18

## 2018-10-20 ENCOUNTER — Telehealth: Payer: Self-pay | Admitting: Clinical

## 2018-10-20 NOTE — Telephone Encounter (Signed)
LCSW spoke with pt's dtr [name] who called to share concerns of pt experiencing morning sleepiness lasting until noon, along with ongoing dizziness worse in the am. Dtr reports that pt's PCP took pt off of Aricept and dtr is concerned for pt's cognition and asks if LCSW can help pt with adherence to restart Aricept. In LCSW's review of chart notes from Dr Boneta Lucks dx is mild cognitive decline, LCSW reiterated this dx to dtr. LCSW provided supportive counseling to dtr as she shares concerns and decision to reduce klonopin ("to see if it helps with dizziness"). Dtr had stopped klonopin before talking w providers, LCSW advised dtr of need for klonopin taper to reduce any withdrawal sx & avoid seizure risk-reduce by 25% each week taking note of any effects. Dtr does not want to restart Aricept at this time and states she will reach out with further questions/concerns.

## 2018-11-06 ENCOUNTER — Other Ambulatory Visit: Payer: Self-pay | Admitting: Gastroenterology

## 2018-11-10 DIAGNOSIS — N183 Chronic kidney disease, stage 3 unspecified: Secondary | ICD-10-CM | POA: Diagnosis not present

## 2018-11-19 ENCOUNTER — Ambulatory Visit (INDEPENDENT_AMBULATORY_CARE_PROVIDER_SITE_OTHER): Payer: Medicare Other | Admitting: Psychiatry

## 2018-11-19 ENCOUNTER — Other Ambulatory Visit: Payer: Self-pay

## 2018-11-19 ENCOUNTER — Encounter: Payer: Self-pay | Admitting: Psychiatry

## 2018-11-19 VITALS — BP 141/81 | HR 73 | Ht 71.0 in | Wt 155.0 lb

## 2018-11-19 DIAGNOSIS — G2581 Restless legs syndrome: Secondary | ICD-10-CM

## 2018-11-19 DIAGNOSIS — F5105 Insomnia due to other mental disorder: Secondary | ICD-10-CM

## 2018-11-19 DIAGNOSIS — G3184 Mild cognitive impairment, so stated: Secondary | ICD-10-CM | POA: Diagnosis not present

## 2018-11-19 DIAGNOSIS — F331 Major depressive disorder, recurrent, moderate: Secondary | ICD-10-CM | POA: Diagnosis not present

## 2018-11-19 MED ORDER — VORTIOXETINE HBR 10 MG PO TABS: 10.0000 mg | ORAL_TABLET | Freq: Every day | ORAL | 0 refills | Status: DC

## 2018-11-19 NOTE — Patient Instructions (Addendum)
Stop Wellbutrin and wait 5 days to see if dizziness is better.  Start Trintellix 5 mg for 2 weeks then increase to 10 mg daily.  Increase clonazepam for restless legs and sleep to 1 of the 0.5 mg tablets nightly

## 2018-11-19 NOTE — Progress Notes (Signed)
[facility] MD/PA/NP Initial Note  11/24/2018 6:44 PM Henry Adams  MRN:  [id number]  Chief Complaint:  Chief Complaint    Depression; Anxiety      HPI: This 78 year old gentleman is referred by neurology Dr. Carles Collet.    Seen with daughter [name].  He has had problems with depression and memory loss that has been attributed to pseudodementia associated with depression.  He doesn't remember the reason why he was referred but D reports the reason as .depression and anxiety.  Has tried some meds that didn't work.  Off and on depressed and it does affect me.  A lot of medical problems contribute.  Recognizes some anxiety.  Wife has Alz and was placed a year ago which was difficult.  Was pacing with anxiety for awhile.  [name] sees difficulty with decisions.  He admits stress with wife is biggest issue.  His Parkinson's dz is causing some cognitive problems.  Per D he is very sensitive to meds and will get dizzy when starts new meds. Dizzy AM until noon daily.   Reduced it to 0.25 mg from 0.5 mg without change in the dizziness.  It does help his sleep and anxiety is not worse so far.  At 0.125 mg Klonopin couldn't sleep.  No change in concentration or memory with reduction in the dose.  Word finding problems.  Donepezil helped but stopped DT dizziness but dizziness is unchanged.  Wellbutrin given to replace the Lexapro without change.  Dizzines worse with higher Wellbutrin 300 so reduced to 150 mg daily.  Minimal effect.  To bed 9 pm and up at 7 usually except the last couple of weeks up at 5 AM.  Pt reports that mood is Anxious and Depressed and describes anxiety as Moderate. Anxiety symptoms include: Excessive Worry,. Pt reports no sleep issues. Pt reports that appetite is weight stable and good. Pt reports that energy is poor and down slightly, loss of interest or pleasure in usual activities and withdrawn from usual activities possibly bc concerned about wife and energy is low. Concentration is  difficulty with focus and attention and down slightly. Suicidal thoughts:  denied by patient.  Can make a sandwich.    Visit Diagnosis:    ICD-10-CM   1. Major depressive disorder, recurrent episode, moderate (HCC)  F33.1   2. Mild cognitive impairment  G31.84   3. Insomnia due to mental condition  F51.05   4. Restless leg syndrome  G25.81     Past Psychiatric History: History of Lexapro 20 helped awhile but partially effective for anxiety and depression Clonazepam helped anxiety and RLS and sleep History Wellbutrin XL 300, trazodone for insomnia and didn't help the RLS. History of Seroquel 25 Rx.  Never taken.  Past Medical History:  Past Medical History:  Diagnosis Date  . Allergy    perennial  . Anxiety   . Arthritis   . Cholelithiasis   . Chronic idiopathic constipation   . CKD (chronic kidney disease)   . Depression   . Diverticulosis   . Enlarged prostate   . Gallstones   . GERD (gastroesophageal reflux disease)   . HLD (hyperlipidemia)   . Hx of cardiovascular stress test    ETT-Myoview 6/14: probable low risk study, inf defect with normal inf wall motion-cannot r/o ischemia; prominent gut uptake adjacent to inf wall on rest images-poss defect is atten, EF 63%  . Hypertension   . Melanoma ([place])   . Parkinsonism ([place])   . Polycystic kidney disease  Past Surgical History:  Procedure Laterality Date  . COLONOSCOPY  2011   diverticulosis  . HERNIA REPAIR     umbilical  . KNEE SURGERY Left   . medialization laryngoplasty  10/15/2013   Dr Joya Gaskins, [facility]  . TONSILLECTOMY AND ADENOIDECTOMY      Family Psychiatric History: No significant family history of depression or anxiety.  Family History:  Family History  Problem Relation Age of Onset  . Hypertension Father   . Cancer Brother        mouth  . Cancer Sister        breast  . Heart disease Brother        heart transplant  . Cancer Daughter        breast  . Stroke Sister        >54  . Skin cancer  Brother        squamous cell  . Colon cancer Neg Hx     Social History:   Lives with Joycie Peek since Dec 2020.  D fixes meds. D works PT. Still drives.  No MVA and not getting lost drive. Social History   Socioeconomic History  . Marital status: Married    Spouse name: Not on file  . Number of children: 2  . Years of education: Not on file  . Highest education level: Not on file  Occupational History  . 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 Financial resource strain: Not on file  . Food insecurity    Worry: Not on file    Inability: Not on file  . Transportation needs    Medical: Not on file    Non-medical: Not on file  Tobacco Use  . Smoking status: Never Smoker  . Smokeless tobacco: Never Used  Substance and Sexual Activity  . Alcohol use: Not Currently    Alcohol/week: 0.0 standard drinks  . Drug use: No  . Sexual activity: Not on file  Lifestyle  . Physical activity    Days per week: Not on file    Minutes per session: Not on file  . Stress: Not on file  Relationships  . Social Herbalist on phone: Not on file    Gets together: Not on file    Attends religious service: Not on file    Active member of club or organization: Not on file    Attends meetings of clubs or organizations: Not on file    Relationship status: Not on file  Other Topics Concern  . Not on file  Social History Narrative  . Not on file    Allergies:  Allergies  Allergen Reactions  . Iodine     rash  . Oxycodone     Mental status changes & nausea  . Pollen Extract Itching  . Tamiflu [Oseltamivir Phosphate] Other (See Comments)    hallucinations  . Tamsulosin     Other reaction(s): Other (See Comments) Unknown   . Doxazosin Rash    agitation  . Etodolac Rash    unknown  . Tape Rash    Certain "Band-Aids"    Metabolic Disorder Labs: Lab Results  Component Value Date   HGBA1C 5.9 12/29/2009   No results found for:  PROLACTIN Lab Results  Component Value Date   CHOL 179 11/14/2016   TRIG 73.0 11/14/2016   HDL 68.50 11/14/2016   CHOLHDL 3 11/14/2016   VLDL 14.6 11/14/2016   LDLCALC 96  11/14/2016   [place] 99 08/11/2015   Lab Results  Component Value Date   TSH 1.36 06/25/2018   TSH 1.64 11/12/2017    Therapeutic Level Labs: No results found for: LITHIUM No results found for: VALPROATE No components found for:  CBMZ  Current Medications: Current Outpatient Medications  Medication Sig Dispense Refill  . acetaminophen (TYLENOL) 500 MG tablet Take 1,000 mg by mouth every 6 (six) hours as needed for moderate pain or fever.    [name] alfuzosin (UROXATRAL) 10 MG 24 hr tablet Take 1 tablet (10 mg total) by mouth daily with breakfast. 90 tablet 1  . AMITIZA 24 MCG capsule TAKE 1 CAPSULE TWICE DAILY WITH MEALS. 60 capsule 0  . buPROPion (WELLBUTRIN XL) 150 MG 24 hr tablet Take 1 tablet (150 mg total) by mouth daily. 90 tablet 1  . carbidopa-levodopa (SINEMET IR) 25-100 MG tablet TAKE 1 TABLET AT 7AM, 11AM, 3PM AND TAKE 1&1/2 TABLETS AT 7PM. 135 tablet 1  . Carbidopa-Levodopa ER (SINEMET CR) 25-100 MG tablet controlled release TAKE ONE TABLET AT BEDTIME. 90 tablet 1  . clonazePAM (KLONOPIN) 0.5 MG tablet Take 1 tablet (0.5 mg total) by mouth at bedtime. (Patient taking differently: Take 0.25 mg by mouth at bedtime. ) 90 tablet 1  . docusate sodium (COLACE) 100 MG capsule Take 100 mg by mouth 2 (two) times daily.     . fexofenadine (ALLEGRA) 180 MG tablet Take 180 mg by mouth daily.      . finasteride (PROSCAR) 5 MG tablet Take 5 mg by mouth daily.    [name] omeprazole (PRILOSEC) 40 MG capsule TAKE (1) CAPSULE DAILY. 90 capsule 1  . Polyethylene Glycol 3350 (MIRALAX PO) Take 17 g by mouth daily.     . vitamin B-12 (CYANOCOBALAMIN) 1000 MCG tablet Take 1,000 mcg by mouth daily.    [name] vortioxetine HBr (TRINTELLIX) 10 MG TABS tablet Take 1 tablet (10 mg total) by mouth daily. 30 tablet 0   No current  facility-administered medications for this visit.     Medication Side Effects: none  Orders placed this visit:  No orders of the defined types were placed in this encounter.   Psychiatric Specialty Exam:  Review of Systems  Constitutional: Negative for chills, diaphoresis, fever, malaise/fatigue and weight loss.  HENT: Negative for congestion, ear discharge, ear pain, hearing loss, nosebleeds, sinus pain, sore throat and tinnitus.        Voice problems with surgery before  Eyes: Negative for blurred vision, double vision, photophobia, pain and discharge.  Respiratory: Negative for cough, hemoptysis, sputum production, shortness of breath, wheezing and stridor.   Cardiovascular: Positive for leg swelling. Negative for chest pain, palpitations, orthopnea, claudication and PND.  Gastrointestinal: Positive for constipation. Negative for abdominal pain, blood in stool, diarrhea, heartburn, melena, nausea and vomiting.  Genitourinary: Positive for frequency. Negative for dysuria, flank pain, hematuria and urgency.  Musculoskeletal: Negative for back pain, joint pain, myalgias and neck pain.  Skin: Negative for rash.  Neurological: Positive for dizziness, tingling, tremors and speech change. Negative for sensory change, focal weakness, seizures, loss of consciousness, weakness and headaches.  Endo/Heme/Allergies: Negative for polydipsia. Bruises/bleeds easily.    Blood pressure (!) 141/81, pulse 73, height 5\' 11"  (1.803 m), weight 155 lb (70.3 kg).Body mass index is 21.62 kg/m.  General Appearance: Casual  Eye Contact:  Good  Speech:  Clear and Coherent and Normal Rate  Volume:  Decreased  Mood:  Depressed  Affect:  Blunt and Depressed  Thought Process:  Coherent No LOA  Orientation:  Full (Time, Place, and Person)  Thought Content: logical No IOR  Suicidal Thoughts:  No  Homicidal Thoughts:  No  Memory:  Recent;   Fair; impaired cognition mildly.  Judgement:  Fair  Insight:  Fair   Psychomotor Activity:  Normal  Concentration:  Concentration: Fair  Recall:  [place] of Knowledge: Good  Language: Good  Assets:  Communication Skills Desire for Improvement Financial Resources/Insurance Housing Intimacy Leisure Time Social Support Transportation Vocational/Educational  ADL's:  Intact  Cognition: Impaired,  Mild  Prognosis:  Fair   Screenings:  GAD-7     Office Visit from 12/05/2016 in [place]  Total GAD-7 Score  7    PHQ2-9     Office Visit from 06/25/2018 in [facility] Visit from 03/27/2018 in [facility] Visit from 12/25/2017 in [facility] Visit from 09/18/2017 in [facility] Visit from 12/05/2016 in [facility]  PHQ-2 Total Score  0  2  1  3  4   PHQ-9 Total Score  -  5  7  9  8       Receiving Psychotherapy: No   Treatment Plan/Recommendations: Referral from neurology Dr. Carles Collet.  Her notes were reviewed in detail with the patient.  Greater than 50% 60-minute of face to face time with patient was spent on counseling and coordination of care. We discussed Discussed his various diagnoses.  Discussed these in detail.  Discussed the treatment options for each of the diagnoses listed.  Discussed the side effects of those options.  Restart donepezil ASAP for declining cognitive function.  Reassess cognition at follow up.  There was concern that it could be causing side effects but it was apparently not causing any side effects.  Disc alternative antidepressants which also help anxiety.  Couldn't tolerate higher dosages of Wellbutrin.  Duloxetine would be a good choice as would Trintellix though it's less effective for anxiety often.  Trintellix has potential to help cognition.  Therefore that seems to be the best option. Start Trintellix 5 mg daily for 7 days then 10 mg daily.   Discussed side effects in detail.  We will continue Wellbutrin XL 150 mg daily for now.  So low-dose.  If Trintellix is helpful then we will end discontinue Wellbutrin.  Discussed drug interaction issues.   Increase clonazepam for RLS to 0.5 mg bc poor sleep.   We discussed the short-term risks associated with benzodiazepines including sedation and increased fall risk among others.  Discussed long-term side effect risk including dependence, potential withdrawal symptoms, and the potential eventual dose-related risk of dementia.  These recommendations were agreed to and discussed with both the patient and his daughter [name].  Call back if there are any side effect issues with this med recommendation.  FU 6 weeks  Purnell Shoemaker, MD

## 2018-12-10 ENCOUNTER — Other Ambulatory Visit: Payer: Self-pay

## 2018-12-10 ENCOUNTER — Other Ambulatory Visit: Payer: Self-pay | Admitting: Gastroenterology

## 2018-12-10 ENCOUNTER — Other Ambulatory Visit: Payer: Self-pay | Admitting: Neurology

## 2018-12-10 DIAGNOSIS — K5900 Constipation, unspecified: Secondary | ICD-10-CM

## 2018-12-10 DIAGNOSIS — K219 Gastro-esophageal reflux disease without esophagitis: Secondary | ICD-10-CM

## 2018-12-10 MED ORDER — LUBIPROSTONE 24 MCG PO CAPS: 24.0000 ug | ORAL_CAPSULE | Freq: Two times a day (BID) | ORAL | 5 refills | Status: DC

## 2018-12-10 NOTE — Telephone Encounter (Signed)
Amitiza refill appropriate? Please advise

## 2018-12-10 NOTE — Progress Notes (Signed)
Refill of Amitiza sent in as MD recommended

## 2018-12-12 ENCOUNTER — Telehealth: Payer: Self-pay | Admitting: Psychiatry

## 2018-12-12 NOTE — Telephone Encounter (Signed)
Patient just started Trintellix.  He is not typically prone to dizziness but it is possible.  I assume at this point they only have 10 mg tablets.  If they have 5 mg tablets reduce the dosage to 5 mg daily.  If they have 10 mg tablets they can give 10 mg every other day or if they have a good pill splitter they can try taking half of a tablet daily

## 2018-12-12 NOTE — Telephone Encounter (Signed)
Henry Adams's daughter just called back wanting to know what to do.  I told her what Dr. Clovis Pu has suggested as outlined in this phone encounter.  It is probably best that you call her anyway to be sure she doesn't have further questions and if you have 5mg  tab samples that would be helpful to her because she said the 10mg  is odd shaped and may be hard to split.

## 2018-12-12 NOTE — Telephone Encounter (Signed)
I spoke with his daughter and she would rather try the 5 mg samples. I  will get them ready and she will pick them up.

## 2018-12-12 NOTE — Telephone Encounter (Signed)
Pt daughter said that pt has been dizzy the last few days and wanted to see if they could make an adjustment to his trintellix.

## 2018-12-25 ENCOUNTER — Encounter: Payer: Self-pay | Admitting: Neurology

## 2018-12-25 ENCOUNTER — Other Ambulatory Visit: Payer: Self-pay | Admitting: Internal Medicine

## 2018-12-25 DIAGNOSIS — R3911 Hesitancy of micturition: Secondary | ICD-10-CM

## 2018-12-25 DIAGNOSIS — N401 Enlarged prostate with lower urinary tract symptoms: Secondary | ICD-10-CM

## 2018-12-25 NOTE — Telephone Encounter (Signed)
Henry Adams's daughter called . Stated she would like to speak with someone concerning the Trintellix he is currently on.

## 2018-12-25 NOTE — Progress Notes (Signed)
Virtual Visit via Video Note The purpose of this virtual visit is to provide medical care while limiting exposure to the novel coronavirus.    Consent was obtained for video visit:  Yes.   Answered questions that patient had about telehealth interaction:  Yes.   I discussed the limitations, risks, security and privacy concerns of performing an evaluation and management service by telemedicine. I also discussed with the patient that there may be a patient responsible charge related to this service. The patient expressed understanding and agreed to proceed.  Pt location: Home Physician Location: office Name of referring provider:  [name], MD I connected with Henry Adams at patients initiation/request on 12/26/2018 at  1:00 PM EST by video enabled telemedicine application and verified that I am speaking with the correct person using two identifiers. Pt MRN:  [id number] Pt DOB:  [birth year] Video Participants:  Henry Adams;  Daughter supplements hx   History of Present Illness:  Patient seen today in follow-up for memory change and parkinsonism.  Many medical records have been reviewed since our last visit, including several emails that his daughter has sent to me.    Patient is also on carbidopa/levodopa 25/100, 1 tablet at 7 AM/11 AM/3 PM and 1.5 tablets at 7 PM.  He is also on carbidopa/levodopa 25/100 CR at bedtime.  Last visit, I increased his Wellbutrin XL to 300 mg.  He ultimately decreased the Wellbutrin back to 150 mg to see if that would help the dizziness, but it did not change it.  The patient had stopped his donepezil because of the dizziness, but that did not change the dizziness.  Dr. Clovis Pu told him to restart it but they haven't done that yet.  He also started the patient on Trintellix.  Daughter states that he took it for 16 days and d/c it because of dizziness and "stomach issues."  Daughter states that Dr. Casimiro Needle office called and said they wanted to start cymbalta  instead.  Pt is also off of the wellbutrin per daughter.  Regarding the dizziness, his daughter asked me if it was related to medication (levodopa), but I really doubted it, as it was only in the morning and he was taking his levodopa all day long. Daughter states that dizziness is better since d/c the wellbutrin.   Patient saw Dr. Melvyn Novas on September 09, 2018.  No evidence of dementia.  There was evidence of mild cognitive impairment.  On klonopin, 0.5 mg at bedtime.  Tried to get off of it but insomnia was worse.  He is still having a "jerking" that wakes him up at night.  He has trouble getting back to sleep after the jerking.   Current Outpatient Medications on File Prior to Visit  Medication Sig Dispense Refill  . acetaminophen (TYLENOL) 500 MG tablet Take 1,000 mg by mouth every 6 (six) hours as needed for moderate pain or fever.    [name] alfuzosin (UROXATRAL) 10 MG 24 hr tablet TAKE 1 TABLET EVERY DAY WITH BREAKFAST. 90 tablet 0  . buPROPion (WELLBUTRIN XL) 150 MG 24 hr tablet Take 1 tablet (150 mg total) by mouth daily. 90 tablet 1  . carbidopa-levodopa (SINEMET IR) 25-100 MG tablet TAKE 1 TABLET AT 7AM, 11AM, 3PM AND TAKE 1&1/2 TABLETS AT 7PM. 135 tablet 3  . Carbidopa-Levodopa ER (SINEMET CR) 25-100 MG tablet controlled release TAKE ONE TABLET AT BEDTIME. 90 tablet 1  . clonazePAM (KLONOPIN) 0.5 MG tablet Take 1 tablet (0.5 mg total)  by mouth at bedtime. (Patient taking differently: Take 0.25 mg by mouth at bedtime. ) 90 tablet 1  . docusate sodium (COLACE) 100 MG capsule Take 100 mg by mouth 2 (two) times daily.     . fexofenadine (ALLEGRA) 180 MG tablet Take 180 mg by mouth daily.      . finasteride (PROSCAR) 5 MG tablet Take 5 mg by mouth daily.    [name] lubiprostone (AMITIZA) 24 MCG capsule Take 1 capsule (24 mcg total) by mouth 2 (two) times daily with a meal. 180 capsule 5  . omeprazole (PRILOSEC) 40 MG capsule TAKE (1) CAPSULE DAILY. 90 capsule 1  . Polyethylene Glycol 3350 (MIRALAX PO) Take 17 g  by mouth daily.     . vitamin B-12 (CYANOCOBALAMIN) 1000 MCG tablet Take 1,000 mcg by mouth daily.    [name] vortioxetine HBr (TRINTELLIX) 10 MG TABS tablet Take 1 tablet (10 mg total) by mouth daily. 30 tablet 0   No current facility-administered medications on file prior to visit.      Observations/Objective:   There were no vitals filed for this visit. GEN:  The patient appears stated age and is in NAD.  Neurological examination:  Orientation: He is alert and oriented x3. Montreal Cognitive Assessment  07/23/2018 02/04/2017  Visuospatial/ Executive (0/5) 2 3  Naming (0/3) 2 1  Attention: Read list of digits (0/2) 1 2  Attention: Read list of letters (0/1) 1 1  Attention: Serial 7 subtraction starting at 100 (0/3) 3 1  Language: Repeat phrase (0/2) 0 2  Language : Fluency (0/1) 0 0  Abstraction (0/2) 1 2  Delayed Recall (0/5) 0 2  Orientation (0/6) 5 6  Total 15 20  Adjusted Score (based on education) 15 20   Cranial nerves: There is good facial symmetry. There is facial hypomimia.  The speech is fluent and clear. Soft palate rises symmetrically and there is no tongue deviation. Hearing is intact to conversational tone. Motor: Strength is at least antigravity x 4.   Shoulder shrug is equal and symmetric.  There is no pronator drift.  Movement examination: Tone: unable Abnormal movements: None Coordination:  There is no decremation with RAM's, with hand opening and closing or finger taps bilaterally Gait and Station:  He easily arises out of the chair.  He has decreased arm swing bilaterally.    Lab Results  Component Value Date   VITAMINB12 361 11/14/2016      Assessment and Plan:   1. Memory loss -He had neurocognitive testing in 05/2017 and 08/2018 and no evidence of dementia. there was evidence of MCI             -off of aricept right now but patient plans to restart it soon.   2. Peripheral neuropathy -The patient has clinical examination  evidence of a diffuse peripheral neuropathy, which certainly can affect gait and balance. We discussed safety associated with peripheral neuropathy. We discussed balance therapy and the importance of ambulatory assistive device for balance assistance. -on b12 supplement.  We will recheck that in the future when we have an in person appointment.   3. Parkinsonism  -He will continue carbidopa/levodopa 25/100, 1 tablet at 7 AM/11 AM/3 PM/ and 7 PM to 1.5 tablets.  Did discuss that we may transition the daytime to CR if he remains fatigued with daytime hypersomnolence, but and making several other changes today, so decided to hold on that. -Continue carbidopa/levodopa 25/100 CR at bedtime. He did not think that the 50/200  helped and thought he had more side effects  -encouraged exercise and getting back to online RSB  4. Depression and anxiety -seeing Dr. Clovis Pu  -think that this is one of the primary issues affecting health  -Dr. Clovis Pu just call them in a prescription for Cymbalta.  They have not started it, but plan to soon.  5.RLS -Continue clonazepam, 0.5 mg at bedtime.  -sounds like having some PLMD that awaken him at night and then trouble getting back to sleep.  Told pt don't want to add more meds as Dr. Clovis Pu changing meds right now.  Also, not sure that adding more meds for this will be helpful, but I did tell him he could take a half a levodopa in the middle of the night if needed.  6.Constipation -following with GI -increase hydration  7.  Mild cognitive impairment  -Patient had neurocognitive testing with Dr. Melvyn Novas in August, 2020 with evidence of mild cognitive impairment.    Follow Up Instructions:  5-6 months  -I discussed the assessment and treatment plan with the patient. The patient was provided an opportunity to ask questions and all were answered. The patient agreed with the  plan and demonstrated an understanding of the instructions.   The patient was advised to call back or seek an in-person evaluation if the symptoms worsen or if the condition fails to improve as anticipated.    Total Time spent in visit with the patient was:  25 min, of which more than 50% of the time was spent in counseling, as above.   Pt understands and agrees with the plan of care outlined.     Alonza Bogus, DO

## 2018-12-25 NOTE — Telephone Encounter (Signed)
Stop Trintellix and Wellbutrin due to intolerability and poor response respectively.  Start duloxetine 20 mg capsules 1 daily.  Keep appointment which is upcoming in about a week or so.  Please send in the prescription.

## 2018-12-26 ENCOUNTER — Telehealth (INDEPENDENT_AMBULATORY_CARE_PROVIDER_SITE_OTHER): Payer: Medicare Other | Admitting: Neurology

## 2018-12-26 ENCOUNTER — Encounter: Payer: Self-pay | Admitting: Internal Medicine

## 2018-12-26 ENCOUNTER — Other Ambulatory Visit: Payer: Self-pay

## 2018-12-26 DIAGNOSIS — G2581 Restless legs syndrome: Secondary | ICD-10-CM | POA: Diagnosis not present

## 2018-12-26 DIAGNOSIS — G20 Parkinson's disease: Secondary | ICD-10-CM

## 2018-12-26 DIAGNOSIS — F331 Major depressive disorder, recurrent, moderate: Secondary | ICD-10-CM | POA: Diagnosis not present

## 2018-12-26 DIAGNOSIS — G3184 Mild cognitive impairment, so stated: Secondary | ICD-10-CM

## 2018-12-26 MED ORDER — DULOXETINE HCL 20 MG PO CPEP: 20.0000 mg | ORAL_CAPSULE | Freq: Every day | ORAL | 0 refills | Status: DC

## 2018-12-26 NOTE — Telephone Encounter (Signed)
Left [name] voicemail with information and to call back with pharmacy

## 2018-12-26 NOTE — Telephone Encounter (Signed)
Henry Adams called back and discussed the Duloxetine 20 mg with her and given instructions. Sent Rx to [facility]  Informed her to keep f/u on 01/01/2019

## 2018-12-29 ENCOUNTER — Other Ambulatory Visit: Payer: Self-pay

## 2018-12-29 ENCOUNTER — Encounter: Payer: Self-pay | Admitting: Internal Medicine

## 2018-12-29 ENCOUNTER — Ambulatory Visit (INDEPENDENT_AMBULATORY_CARE_PROVIDER_SITE_OTHER): Payer: Medicare Other | Admitting: Internal Medicine

## 2018-12-29 ENCOUNTER — Other Ambulatory Visit (INDEPENDENT_AMBULATORY_CARE_PROVIDER_SITE_OTHER): Payer: Medicare Other

## 2018-12-29 VITALS — BP 152/90 | HR 77 | Temp 97.6°F | Resp 16 | Ht 71.0 in | Wt 154.0 lb

## 2018-12-29 DIAGNOSIS — R824 Acetonuria: Secondary | ICD-10-CM

## 2018-12-29 DIAGNOSIS — R3989 Other symptoms and signs involving the genitourinary system: Secondary | ICD-10-CM | POA: Diagnosis not present

## 2018-12-29 DIAGNOSIS — R739 Hyperglycemia, unspecified: Secondary | ICD-10-CM

## 2018-12-29 DIAGNOSIS — I10 Essential (primary) hypertension: Secondary | ICD-10-CM

## 2018-12-29 LAB — CBC WITH DIFFERENTIAL/PLATELET
Basophils Absolute: 0 10*3/uL (ref 0.0–0.1)
Basophils Relative: 0.7 % (ref 0.0–3.0)
Eosinophils Absolute: 0.2 10*3/uL (ref 0.0–0.7)
Eosinophils Relative: 2.8 % (ref 0.0–5.0)
HCT: 39.8 % (ref 39.0–52.0)
Hemoglobin: 13.2 g/dL (ref 13.0–17.0)
Lymphocytes Relative: 22.6 % (ref 12.0–46.0)
Lymphs Abs: 1.5 10*3/uL (ref 0.7–4.0)
MCHC: 33.3 g/dL (ref 30.0–36.0)
MCV: 94.9 fl (ref 78.0–100.0)
Monocytes Absolute: 0.7 10*3/uL (ref 0.1–1.0)
Monocytes Relative: 11.1 % (ref 3.0–12.0)
Neutro Abs: 4.1 10*3/uL (ref 1.4–7.7)
Neutrophils Relative %: 62.8 % (ref 43.0–77.0)
Platelets: 150 10*3/uL (ref 150.0–400.0)
RBC: 4.2 Mil/uL — ABNORMAL LOW (ref 4.22–5.81)
RDW: 14 % (ref 11.5–15.5)
WBC: 6.6 10*3/uL (ref 4.0–10.5)

## 2018-12-29 LAB — HEPATIC FUNCTION PANEL
ALT: 4 U/L (ref 0–53)
AST: 13 U/L (ref 0–37)
Albumin: 3.9 g/dL (ref 3.5–5.2)
Alkaline Phosphatase: 75 U/L (ref 39–117)
Bilirubin, Direct: 0.1 mg/dL (ref 0.0–0.3)
Total Bilirubin: 0.8 mg/dL (ref 0.2–1.2)
Total Protein: 7 g/dL (ref 6.0–8.3)

## 2018-12-29 LAB — POC URINALSYSI DIPSTICK (AUTOMATED)
Bilirubin, UA: POSITIVE
Blood, UA: NEGATIVE
Glucose, UA: NEGATIVE
Ketones, UA: POSITIVE
Leukocytes, UA: NEGATIVE
Nitrite, UA: NEGATIVE
Protein, UA: POSITIVE — AB
Spec Grav, UA: 1.025 (ref 1.010–1.025)
Urobilinogen, UA: 0.2 E.U./dL
pH, UA: 6 (ref 5.0–8.0)

## 2018-12-29 LAB — BASIC METABOLIC PANEL
BUN: 26 mg/dL — ABNORMAL HIGH (ref 6–23)
CO2: 28 mEq/L (ref 19–32)
Calcium: 8.8 mg/dL (ref 8.4–10.5)
Chloride: 105 mEq/L (ref 96–112)
Creatinine, Ser: 1.41 mg/dL (ref 0.40–1.50)
GFR: 48.26 mL/min — ABNORMAL LOW (ref >60.00)
Glucose, Bld: 108 mg/dL — ABNORMAL HIGH (ref 70–99)
Potassium: 3.9 mEq/L (ref 3.5–5.1)
Sodium: 138 mEq/L (ref 135–145)

## 2018-12-29 LAB — HEMOGLOBIN A1C: Hgb A1c MFr Bld: 5.8 % (ref 4.6–6.5)

## 2018-12-29 NOTE — Progress Notes (Signed)
Subjective:  Patient ID: Henry Adams, male    DOB: 16-May-1940  Age: 78 y.o. MRN: [id number]  CC: Hypertension  This visit occurred during the SARS-CoV-2 public health emergency.  Safety protocols were in place, including screening questions prior to the visit, additional usage of staff PPE, and extensive cleaning of exam room while observing appropriate contact time as indicated for disinfecting solutions.   HPI Henry Adams presents for f/up - He is with his daughter today.  They have both noticed that over the last week his urine has been dark.  He has his usual complaints of abdominal distention but has not recently had any episodes of pain, nausea, or vomiting.  He tells me he thinks he is gaining weight but our data shows that he is lost a pound since I last saw him.  He struggles with chronic constipation that is being treated with a combination of Amitiza, Colace, and MiraLAX.  Outpatient Medications Prior to Visit  Medication Sig Dispense Refill  . acetaminophen (TYLENOL) 500 MG tablet Take 1,000 mg by mouth every 6 (six) hours as needed for moderate pain or fever.    [name] alfuzosin (UROXATRAL) 10 MG 24 hr tablet TAKE 1 TABLET EVERY DAY WITH BREAKFAST. 90 tablet 0  . carbidopa-levodopa (SINEMET IR) 25-100 MG tablet TAKE 1 TABLET AT 7AM, 11AM, 3PM AND TAKE 1&1/2 TABLETS AT 7PM. 135 tablet 3  . Carbidopa-Levodopa ER (SINEMET CR) 25-100 MG tablet controlled release TAKE ONE TABLET AT BEDTIME. 90 tablet 1  . clonazePAM (KLONOPIN) 0.5 MG tablet Take 1 tablet (0.5 mg total) by mouth at bedtime. (Patient taking differently: Take 0.25 mg by mouth at bedtime. ) 90 tablet 1  . docusate sodium (COLACE) 100 MG capsule Take 100 mg by mouth 2 (two) times daily.     . DULoxetine (CYMBALTA) 20 MG capsule Take 1 capsule (20 mg total) by mouth daily. 30 capsule 0  . fexofenadine (ALLEGRA) 180 MG tablet Take 180 mg by mouth daily.      . finasteride (PROSCAR) 5 MG tablet Take 5 mg by mouth daily.    [name]  lubiprostone (AMITIZA) 24 MCG capsule Take 1 capsule (24 mcg total) by mouth 2 (two) times daily with a meal. 180 capsule 5  . omeprazole (PRILOSEC) 40 MG capsule TAKE (1) CAPSULE DAILY. 90 capsule 1  . Polyethylene Glycol 3350 (MIRALAX PO) Take 17 g by mouth daily.     . vitamin B-12 (CYANOCOBALAMIN) 1000 MCG tablet Take 1,000 mcg by mouth daily.    [name] buPROPion (WELLBUTRIN XL) 150 MG 24 hr tablet Take 1 tablet (150 mg total) by mouth daily. 90 tablet 1  . vortioxetine HBr (TRINTELLIX) 10 MG TABS tablet Take 1 tablet (10 mg total) by mouth daily. 30 tablet 0   No facility-administered medications prior to visit.     ROS Review of Systems  Constitutional: Negative.  Negative for appetite change, chills, diaphoresis, fatigue, fever and unexpected weight change.  HENT: Negative.  Negative for trouble swallowing.   Respiratory: Negative for cough, chest tightness, shortness of breath and wheezing.   Cardiovascular: Negative.  Negative for chest pain, palpitations and leg swelling.  Gastrointestinal: Positive for abdominal distention and constipation. Negative for abdominal pain, blood in stool, diarrhea, nausea and vomiting.  Genitourinary: Negative.  Negative for difficulty urinating, flank pain and frequency.  Musculoskeletal: Negative.  Negative for arthralgias and myalgias.  Skin: Negative.  Negative for color change and pallor.  Neurological: Negative for dizziness, weakness, light-headedness  and headaches.  Hematological: Negative for adenopathy. Does not bruise/bleed easily.  Psychiatric/Behavioral: Negative.     Objective:  BP (!) 152/90 (BP Location: Left Arm, Patient Position: Sitting, Cuff Size: Normal)   Pulse 77   Temp 97.6 F (36.4 C) (Oral)   Resp 16   Ht 5\' 11"  (1.803 m)   Wt 154 lb (69.9 kg)   SpO2 97%   BMI 21.48 kg/m   BP Readings from Last 3 Encounters:  12/29/18 (!) 152/90  09/11/18 138/80  06/25/18 130/80    Wt Readings from Last 3 Encounters:  12/29/18  154 lb (69.9 kg)  09/11/18 155 lb 8 oz (70.5 kg)  07/23/18 156 lb (70.8 kg)    Physical Exam Vitals signs reviewed.  Constitutional:      General: He is not in acute distress.    Appearance: He is not toxic-appearing or diaphoretic.  HENT:     Nose: Nose normal.     Mouth/Throat:     Mouth: Mucous membranes are moist.  Eyes:     General: No scleral icterus.    Conjunctiva/sclera: Conjunctivae normal.  Neck:     Musculoskeletal: Neck supple.  Cardiovascular:     Rate and Rhythm: Normal rate and regular rhythm.     Heart sounds: No murmur.  Pulmonary:     Effort: Pulmonary effort is normal.     Breath sounds: Normal breath sounds. No stridor. No decreased breath sounds, wheezing, rhonchi or rales.  Abdominal:     General: Abdomen is protuberant. Bowel sounds are normal. There is no distension.     Palpations: Abdomen is soft. There is no shifting dullness, hepatomegaly, splenomegaly or mass.     Tenderness: There is no abdominal tenderness.  Musculoskeletal: Normal range of motion.     Right lower leg: No edema.     Left lower leg: No edema.  Lymphadenopathy:     Cervical: No cervical adenopathy.  Skin:    General: Skin is warm and dry.  Neurological:     General: No focal deficit present.     Mental Status: He is alert. Mental status is at baseline.  Psychiatric:        Mood and Affect: Mood normal.     Lab Results  Component Value Date   WBC 6.6 12/29/2018   HGB 13.2 12/29/2018   HCT 39.8 12/29/2018   PLT 150.0 12/29/2018   GLUCOSE 108 (H) 12/29/2018   CHOL 179 11/14/2016   TRIG 73.0 11/14/2016   HDL 68.50 11/14/2016   LDLDIRECT 131.7 07/02/2011   LDLCALC 96 11/14/2016   ALT 4 12/29/2018   AST 13 12/29/2018   NA 138 12/29/2018   K 3.9 12/29/2018   CL 105 12/29/2018   CREATININE 1.41 12/29/2018   BUN 26 (H) 12/29/2018   CO2 28 12/29/2018   TSH 1.36 06/25/2018   PSA 2.28 08/11/2015   INR 1.1 (H) 08/01/2012   HGBA1C 5.8 12/29/2018    Dg Abd Acute 2+v  W 1v Chest  Result Date: 09/11/2018 CLINICAL DATA:  Abdominal pain. EXAM: DG ABDOMEN ACUTE W/ 1V CHEST COMPARISON:  CT AP 06/16/2018 FINDINGS: There is no evidence of dilated bowel loops or free intraperitoneal air. No radiopaque calculi or other significant radiographic abnormality is seen. Heart size and mediastinal contours are within normal limits. Both lungs are clear. IMPRESSION: Negative abdominal radiographs.  No acute cardiopulmonary disease. Electronically Signed   By: Kerby Moors M.D.   On: 09/11/2018 17:16    14:17 68mo  ago 43mo ago 42yr ago    Color, UA  Dark Yellow   yellow    Clarity, UA  Turbid   clear    Glucose, UA Negative Negative   Negative    Bilirubin, UA  POSITIVE   negative    Comment: 1 mg/dL  Ketones, UA  POSITIVE   negative    Comment: 5 mg/dL  Spec Grav, UA 1.010 - 1.025 1.025   1.015    Blood, UA  Negative   negative    pH, UA 5.0 - 8.0 6.0   6.0    Protein, UA Negative PositiveAbnormal    Negative    Comment: 30 mg/dL  Urobilinogen, UA 0.2 or 1.0 E.U./dL 0.2  1.0 R  0.2  0.2 R   Nitrite, UA  Negative   negative    Leukocytes, UA Negative Negative          Assessment & Plan:   Mikai was seen today for hypertension.  Diagnoses and all orders for this visit:  Dark yellow-colored urine- His UA is positive for a small amount of bilirubin and ketones.  His LFTt's are normal. His serum bicarb is normal, his anion gap is 5.0, his delta gap is -7.0, delta ratio is 1.8 which is suspicious for pure anion gap acidosis - questionable starvation ketosis.  I am concerned that his symptoms are related to poor p.o. intake so I have encouraged his daughter to encourage him to increase his intake of liquids and food. -     POCT Urinalysis Dipstick (Automated) -     Hepatic function panel; Future  Essential hypertension- His blood pressure is adequately well controlled.  Electrolytes and renal function are normal. -     CBC with Differential; Future -     Basic  metabolic panel; Future -     Hepatic function panel; Future  Ketonuria- See above. -     Basic metabolic panel; Future -     Hepatic function panel; Future  Hyperglycemia- His A1c is at 5.8%.  He has mild prediabetes.  Medical therapy is not indicated. -     Hemoglobin A1c; Future -     Hepatic function panel; Future   I have discontinued Henry Adams. Henry Adams's buPROPion and vortioxetine HBr. I am also having him maintain his fexofenadine, vitamin B-12, Polyethylene Glycol 3350 (MIRALAX PO), docusate sodium, acetaminophen, finasteride, Carbidopa-Levodopa ER, omeprazole, clonazePAM, carbidopa-levodopa, lubiprostone, alfuzosin, and DULoxetine.  No orders of the defined types were placed in this encounter.    Follow-up: No follow-ups on file.  Scarlette Calico, MD

## 2018-12-30 ENCOUNTER — Encounter: Payer: Self-pay | Admitting: Internal Medicine

## 2018-12-30 NOTE — Patient Instructions (Signed)

## 2019-01-01 ENCOUNTER — Ambulatory Visit (INDEPENDENT_AMBULATORY_CARE_PROVIDER_SITE_OTHER): Payer: Medicare Other | Admitting: Psychiatry

## 2019-01-01 ENCOUNTER — Other Ambulatory Visit: Payer: Self-pay

## 2019-01-01 ENCOUNTER — Encounter: Payer: Self-pay | Admitting: Psychiatry

## 2019-01-01 DIAGNOSIS — G2581 Restless legs syndrome: Secondary | ICD-10-CM | POA: Diagnosis not present

## 2019-01-01 DIAGNOSIS — G3184 Mild cognitive impairment, so stated: Secondary | ICD-10-CM | POA: Diagnosis not present

## 2019-01-01 DIAGNOSIS — F331 Major depressive disorder, recurrent, moderate: Secondary | ICD-10-CM

## 2019-01-01 DIAGNOSIS — F5105 Insomnia due to other mental disorder: Secondary | ICD-10-CM | POA: Diagnosis not present

## 2019-01-01 MED ORDER — METHYLPHENIDATE HCL 5 MG PO TABS: 5.0000 mg | ORAL_TABLET | Freq: Two times a day (BID) | ORAL | 0 refills | Status: DC

## 2019-01-01 NOTE — Progress Notes (Signed)
Henry Adams [id number] 11/28/1940 78 y.o.  Subjective:   Patient ID:  Henry Adams is a 78 y.o. (DOB 1940-03-12) male.  Chief Complaint:  Chief Complaint  Patient presents with  . Follow-up    Medication Management  . Depression    Medication Management    HPI Henry Adams presents to the office today for follow-up of depression.  First seen Oct 28 started Trintellix 5. Couldn't tolerate it DT dizziness and GI pain.  Resolved off it. Switched to duloxetine 12/6 20 mg daily.  Seen with D [name] noticed more confusion esp in the morning which correlated with starting duloxetine and not starting clonazepam 0.5 mg daily.  Dizziness better off Wellbutrin. Sleep better with clonazepam to be 930 and up about 530AM.  Some EMA per D from PD movement issues.  Dr. Carles Collet adjusted meds and that helped.  Did not restart donepezil bc multiple other med changes.  Tends to chronic constipation.    Not deeply depressed nor anxious.  Low energy.  Parkinson's interferes with normal activities.  Enjoys family activities.  Not reading much but can.  Watches some TV but less than in the past. Less RLS with clonazepam and sleep is better.  Review of Systems:  Review of Systems  Gastrointestinal: Positive for constipation.  Neurological: Positive for dizziness, tremors and weakness.  Psychiatric/Behavioral: Positive for confusion and decreased concentration.    Medications: I have reviewed the patient's current medications.  Current Outpatient Medications  Medication Sig Dispense Refill  . acetaminophen (TYLENOL) 500 MG tablet Take 1,000 mg by mouth every 6 (six) hours as needed for moderate pain or fever.    [name] alfuzosin (UROXATRAL) 10 MG 24 hr tablet TAKE 1 TABLET EVERY DAY WITH BREAKFAST. 90 tablet 0  . carbidopa-levodopa (SINEMET IR) 25-100 MG tablet TAKE 1 TABLET AT 7AM, 11AM, 3PM AND TAKE 1&1/2 TABLETS AT 7PM. 135 tablet 3  . Carbidopa-Levodopa ER (SINEMET CR) 25-100 MG tablet controlled release  TAKE ONE TABLET AT BEDTIME. 90 tablet 1  . clonazePAM (KLONOPIN) 0.5 MG tablet Take 1 tablet (0.5 mg total) by mouth at bedtime. (Patient taking differently: Take 0.25 mg by mouth at bedtime. ) 90 tablet 1  . docusate sodium (COLACE) 100 MG capsule Take 100 mg by mouth 2 (two) times daily.     . DULoxetine (CYMBALTA) 20 MG capsule Take 1 capsule (20 mg total) by mouth daily. 30 capsule 0  . fexofenadine (ALLEGRA) 180 MG tablet Take 180 mg by mouth daily.      . finasteride (PROSCAR) 5 MG tablet Take 5 mg by mouth daily.    [name] lubiprostone (AMITIZA) 24 MCG capsule Take 1 capsule (24 mcg total) by mouth 2 (two) times daily with a meal. 180 capsule 5  . omeprazole (PRILOSEC) 40 MG capsule TAKE (1) CAPSULE DAILY. 90 capsule 1  . Polyethylene Glycol 3350 (MIRALAX PO) Take 17 g by mouth daily.     . vitamin B-12 (CYANOCOBALAMIN) 1000 MCG tablet Take 1,000 mcg by mouth daily.     No current facility-administered medications for this visit.    Medication Side Effects: None obvious  Allergies:  Allergies  Allergen Reactions  . Iodine     rash  . Oxycodone     Mental status changes & nausea  . Pollen Extract Itching  . Tamiflu [Oseltamivir Phosphate] Other (See Comments)    hallucinations  . Tamsulosin     Other reaction(s): Other (See Comments) Unknown   . Doxazosin Rash  agitation  . Etodolac Rash    unknown  . Tape Rash    Certain "Band-Aids"    Past Medical History:  Diagnosis Date  . Allergy    perennial  . Anxiety   . Arthritis   . Cholelithiasis   . Chronic idiopathic constipation   . CKD (chronic kidney disease)   . Depression   . Diverticulosis   . Enlarged prostate   . Gallstones   . GERD (gastroesophageal reflux disease)   . HLD (hyperlipidemia)   . Hx of cardiovascular stress test    ETT-Myoview 6/14: probable low risk study, inf defect with normal inf wall motion-cannot r/o ischemia; prominent gut uptake adjacent to inf wall on rest images-poss defect is  atten, EF 63%  . Hypertension   . Melanoma ([place])   . Parkinsonism ([place])   . Polycystic kidney disease     Family History  Problem Relation Age of Onset  . Hypertension Father   . Cancer Brother        mouth  . Cancer Sister        breast  . Heart disease Brother        heart transplant  . Cancer Daughter        breast  . Stroke Sister        >82  . Skin cancer Brother        squamous cell  . Colon cancer Neg Hx     Social History   Socioeconomic History  . Marital status: Married    Spouse name: Not on file  . Number of children: 2  . Years of education: Not on file  . Highest education level: Not on file  Occupational History  . 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 Smoking status: Never Smoker  . Smokeless tobacco: Never Used  Substance and Sexual Activity  . Alcohol use: Not Currently    Alcohol/week: 0.0 standard drinks  . Drug use: No  . Sexual activity: Not on file  Other Topics Concern  . Not on file  Social History Narrative  . Not on file   Social Determinants of Health   Financial Resource Strain:   . Difficulty of Paying Living Expenses: Not on file  Food Insecurity:   . Worried About Charity fundraiser in the Last Year: Not on file  . Ran Out of Food in the Last Year: Not on file  Transportation Needs:   . Lack of Transportation (Medical): Not on file  . Lack of Transportation (Non-Medical): Not on file  Physical Activity:   . Days of Exercise per Week: Not on file  . Minutes of Exercise per Session: Not on file  Stress:   . Feeling of Stress : Not on file  Social Connections:   . Frequency of Communication with Friends and Family: Not on file  . Frequency of Social Gatherings with Friends and Family: Not on file  . Attends Religious Services: Not on file  . Active Member of Clubs or Organizations: Not on file  . Attends Archivist Meetings: Not on file  . Marital Status: Not  on file  Intimate Partner Violence:   . Fear of Current or Ex-Partner: Not on file  . Emotionally Abused: Not on file  . Physically Abused: Not on file  . Sexually Abused: Not on file    Past Medical History, Surgical history, Social history, and Family history were reviewed  and updated as appropriate.   Please see review of systems for further details on the patient's review from today.   Objective:   Physical Exam:  There were no vitals taken for this visit.  Physical Exam Constitutional:      Appearance: Normal appearance. He is normal weight.  Neurological:     Mental Status: He is alert.     Motor: Weakness and tremor present.     Coordination: Coordination abnormal.     Gait: Gait abnormal.  Psychiatric:        Attention and Perception: He does not perceive auditory or visual hallucinations.        Mood and Affect: Mood is depressed. Mood is not anxious or elated. Affect is blunt. Affect is not tearful.        Speech: Speech is delayed.        Behavior: Behavior is slowed.        Thought Content: Thought content is not paranoid. Thought content does not include homicidal or suicidal ideation.        Cognition and Memory: Memory is impaired. He exhibits impaired recent memory.     Comments: Word finding problems "Not serious depression" Insight and judgment fair. Slowed responses significantly. Somewhat vague about goals. December 02, 2018. Knows Dr. Clovis Pu. Immed mem 2/3. current president unknown. DOL Recall 2/3      Lab Review:     Component Value Date/Time   NA 138 12/29/2018 1436   K 3.9 12/29/2018 1436   CL 105 12/29/2018 1436   CO2 28 12/29/2018 1436   GLUCOSE 108 (H) 12/29/2018 1436   GLUCOSE 105 (H) 12/03/2005 0000   BUN 26 (H) 12/29/2018 1436   CREATININE 1.41 12/29/2018 1436   CALCIUM 8.8 12/29/2018 1436   PROT 7.0 12/29/2018 1436   ALBUMIN 3.9 12/29/2018 1436   AST 13 12/29/2018 1436   ALT 4 12/29/2018 1436   ALKPHOS 75 12/29/2018 1436    BILITOT 0.8 12/29/2018 1436   GFRNONAA 49 (L) 06/19/2018 0826   GFRAA 57 (L) 06/19/2018 0826       Component Value Date/Time   WBC 6.6 12/29/2018 1436   RBC 4.20 (L) 12/29/2018 1436   HGB 13.2 12/29/2018 1436   HCT 39.8 12/29/2018 1436   PLT 150.0 12/29/2018 1436   MCV 94.9 12/29/2018 1436   MCH 31.5 06/19/2018 0826   MCHC 33.3 12/29/2018 1436   RDW 14.0 12/29/2018 1436   LYMPHSABS 1.5 12/29/2018 1436   MONOABS 0.7 12/29/2018 1436   EOSABS 0.2 12/29/2018 1436   BASOSABS 0.0 12/29/2018 1436    No results found for: POCLITH, LITHIUM   No results found for: PHENYTOIN, PHENOBARB, VALPROATE, CBMZ   .res Assessment: Plan:    Roxana was seen today for follow-up and depression.  Diagnoses and all orders for this visit:  Major depressive disorder, recurrent episode, moderate (HCC)  Mild cognitive impairment  Insomnia due to mental condition  Restless leg syndrome   Greater than 50% of 45 min face to face time with patient was spent on counseling and coordination of care. We discussed the complex and the relationship between depression, Parkinson's disease including the cognitive complications of it.  These are all interrelated in his case.  Cognitive screening shows reduced attention and short-term memory issues but these are not severe.  He is cognitively slow and obviously physically slow due to the Parkinson's disease.  It is difficult to tell how much depression contributes but he does not acknowledge severe depression  at this time.  His daughter feels like he is having a little more problems with confusion at the low-dose duloxetine.  He did not tolerate Trintellix.  Theoretically an SNRI such as duloxetine would have less likelihood of cognitive slowing then would an SSRI.  Consider change to more noradrenergic antidepressant like Pristiq. Defer and continue duloxetine 20 daily as long as confusion resolves.  He is only been on this medicine for 4 days.  Consider  stimulant bc attention and off label depression and for more rapid response. He would like to pursue it to help more quickly with energy, endurance, focus.  He is particularly interested because of the potential of a rapid response.  Encouraged him to call back if the low-dose does nothing.  We discussed side effects in detail. Discussed potential benefits, risks, and side effects of stimulants with patient to include increased heart rate, palpitations, insomnia, increased anxiety, increased irritability, or decreased appetite.  Instructed patient to contact office if experiencing any significant tolerability issues.  Ritalin 5 mg BID  Encouraged physical, social, and mental stimulation to help offset some of his symptoms as well.  The treatment plan was discussed with both the patient and his daughter.  She had several questions and we discussed those and she is agreeable with the plan also.  FU 4-6 weeks  Lynder Parents, MD, DFAPA    Please see After Visit Summary for patient specific instructions.  Future Appointments  Date Time Provider [place]  05/29/2019  2:30 PM Tat, Eustace Quail, DO [id number] None    No orders of the defined types were placed in this encounter.   -------------------------------

## 2019-01-05 ENCOUNTER — Other Ambulatory Visit: Payer: Self-pay | Admitting: Neurology

## 2019-01-12 DIAGNOSIS — N390 Urinary tract infection, site not specified: Secondary | ICD-10-CM | POA: Diagnosis not present

## 2019-01-12 DIAGNOSIS — N183 Chronic kidney disease, stage 3 unspecified: Secondary | ICD-10-CM | POA: Diagnosis not present

## 2019-01-12 DIAGNOSIS — I129 Hypertensive chronic kidney disease with stage 1 through stage 4 chronic kidney disease, or unspecified chronic kidney disease: Secondary | ICD-10-CM | POA: Diagnosis not present

## 2019-01-12 DIAGNOSIS — Q613 Polycystic kidney, unspecified: Secondary | ICD-10-CM | POA: Diagnosis not present

## 2019-01-29 ENCOUNTER — Other Ambulatory Visit: Payer: Self-pay

## 2019-01-29 ENCOUNTER — Ambulatory Visit (INDEPENDENT_AMBULATORY_CARE_PROVIDER_SITE_OTHER): Payer: Medicare Other | Admitting: Psychiatry

## 2019-01-29 ENCOUNTER — Encounter: Payer: Self-pay | Admitting: Psychiatry

## 2019-01-29 VITALS — BP 126/78 | HR 73

## 2019-01-29 DIAGNOSIS — F331 Major depressive disorder, recurrent, moderate: Secondary | ICD-10-CM

## 2019-01-29 DIAGNOSIS — G3184 Mild cognitive impairment, so stated: Secondary | ICD-10-CM

## 2019-01-29 DIAGNOSIS — G2581 Restless legs syndrome: Secondary | ICD-10-CM | POA: Diagnosis not present

## 2019-01-29 DIAGNOSIS — F5105 Insomnia due to other mental disorder: Secondary | ICD-10-CM

## 2019-01-29 NOTE — Progress Notes (Signed)
NISSAN SOLIN [id number] 06-Dec-1940 79 y.o.  Subjective:   Patient ID:  Henry Adams is a 79 y.o. (DOB [birth year]) male.  Chief Complaint:  Chief Complaint  Patient presents with  . Follow-up    Medication Management  . Depression    Medication Management  . Altered Mental Status  . Fatigue  . Sleeping Problem    HPI Henry Adams presents to the office today for follow-up of depression.    First seen Oct 28 started Trintellix 5. Couldn't tolerate it DT dizziness and GI pain.  Resolved off it. Switched to duloxetine 12/6 20 mg daily.  Last seen January 01, 2019 with daughter present.  We continued recently started duloxetine 20 mg daily and added Ritalin 5 mg twice daily for augmentation and cognitive reasons.  D stopped duloxetine 12/20 bc confusion which has gotten better 50% better but still depressed.  D says he tolerated the Ritalin but she's only giving it prn 3-4 times per week.   D more concerned about the confusion than depression but in last several weeks CO feet pain and stomach upset and can't sleep.  Last night used the word anxiety but still having trouble with sleep.  Pt says he feels pretty good. No late day caffeine.   Clonazepam initially helped sleep but is not doing so now.  Dr. Carles Collet recently increase pm Sinemet to help RLS and sleep but without much effect.  Did not restart donepezil bc multiple other med changes.  Tends to chronic constipation.    Not deeply depressed nor anxious.  Low energy.  Parkinson's interferes with normal activities.  Enjoys family activities.  Not reading much but can.  Watches some TV but less than in the past.   Past Psychiatric History: History of Lexapro 20 helped awhile but partially effective for anxiety and depression  Trintellix SE Duloxetine 20 Ritalin 5 BID Clonazepam helped anxiety and RLS and sleep History Wellbutrin XL 300,  trazodone for insomnia and didn't help the RLS. History of Seroquel 25 Rx.  Never  taken.  Review of Systems:  Review of Systems  HENT: Positive for voice change.   Gastrointestinal: Positive for constipation.  Neurological: Positive for dizziness, tremors and weakness.  Psychiatric/Behavioral: Positive for confusion and decreased concentration.    Medications: I have reviewed the patient's current medications.  Current Outpatient Medications  Medication Sig Dispense Refill  . acetaminophen (TYLENOL) 500 MG tablet Take 1,000 mg by mouth every 6 (six) hours as needed for moderate pain or fever.    [name] alfuzosin (UROXATRAL) 10 MG 24 hr tablet TAKE 1 TABLET EVERY DAY WITH BREAKFAST. 90 tablet 0  . carbidopa-levodopa (SINEMET IR) 25-100 MG tablet TAKE 1 TABLET AT 7AM, 11AM, 3PM AND TAKE 1&1/2 TABLETS AT 7PM. 135 tablet 3  . Carbidopa-Levodopa ER (SINEMET CR) 25-100 MG tablet controlled release TAKE ONE TABLET AT BEDTIME. 90 tablet 0  . clonazePAM (KLONOPIN) 0.5 MG tablet Take 1 tablet (0.5 mg total) by mouth at bedtime. 90 tablet 1  . docusate sodium (COLACE) 100 MG capsule Take 100 mg by mouth 2 (two) times daily.     . fexofenadine (ALLEGRA) 180 MG tablet Take 180 mg by mouth daily.      . finasteride (PROSCAR) 5 MG tablet Take 5 mg by mouth daily.    [name] lubiprostone (AMITIZA) 24 MCG capsule Take 1 capsule (24 mcg total) by mouth 2 (two) times daily with a meal. 180 capsule 5  . methylphenidate (RITALIN) 5 MG tablet  Take 1 tablet (5 mg total) by mouth 2 (two) times daily with breakfast and lunch. 60 tablet 0  . omeprazole (PRILOSEC) 40 MG capsule TAKE (1) CAPSULE DAILY. 90 capsule 1  . Polyethylene Glycol 3350 (MIRALAX PO) Take 17 g by mouth daily.     . vitamin B-12 (CYANOCOBALAMIN) 1000 MCG tablet Take 1,000 mcg by mouth daily.     No current facility-administered medications for this visit.    Medication Side Effects: None obvious  Allergies:  Allergies  Allergen Reactions  . Iodine     rash  . Oxycodone     Mental status changes & nausea  . Pollen Extract  Itching  . Tamiflu [Oseltamivir Phosphate] Other (See Comments)    hallucinations  . Tamsulosin     Other reaction(s): Other (See Comments) Unknown   . Doxazosin Rash    agitation  . Etodolac Rash    unknown  . Tape Rash    Certain "Band-Aids"    Past Medical History:  Diagnosis Date  . Allergy    perennial  . Anxiety   . Arthritis   . Cholelithiasis   . Chronic idiopathic constipation   . CKD (chronic kidney disease)   . Depression   . Diverticulosis   . Enlarged prostate   . Gallstones   . GERD (gastroesophageal reflux disease)   . HLD (hyperlipidemia)   . Hx of cardiovascular stress test    ETT-Myoview 6/14: probable low risk study, inf defect with normal inf wall motion-cannot r/o ischemia; prominent gut uptake adjacent to inf wall on rest images-poss defect is atten, EF 63%  . Hypertension   . Melanoma ([place])   . Parkinsonism ([place])   . Polycystic kidney disease     Family History  Problem Relation Age of Onset  . Hypertension Father   . Cancer Brother        mouth  . Cancer Sister        breast  . Heart disease Brother        heart transplant  . Cancer Daughter        breast  . Stroke Sister        >76  . Skin cancer Brother        squamous cell  . Colon cancer Neg Hx     Social History   Socioeconomic History  . Marital status: Married    Spouse name: Not on file  . Number of children: 2  . Years of education: Not on file  . Highest education level: Not on file  Occupational History  . 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 Smoking status: Never Smoker  . Smokeless tobacco: Never Used  Substance and Sexual Activity  . Alcohol use: Not Currently    Alcohol/week: 0.0 standard drinks  . Drug use: No  . Sexual activity: Not on file  Other Topics Concern  . Not on file  Social History Narrative  . Not on file   Social Determinants of Health   Financial Resource Strain:   . Difficulty of  Paying Living Expenses: Not on file  Food Insecurity:   . Worried About Charity fundraiser in the Last Year: Not on file  . Ran Out of Food in the Last Year: Not on file  Transportation Needs:   . Lack of Transportation (Medical): Not on file  . Lack of Transportation (Non-Medical): Not on file  Physical Activity:   .  Days of Exercise per Week: Not on file  . Minutes of Exercise per Session: Not on file  Stress:   . Feeling of Stress : Not on file  Social Connections:   . Frequency of Communication with Friends and Family: Not on file  . Frequency of Social Gatherings with Friends and Family: Not on file  . Attends Religious Services: Not on file  . Active Member of Clubs or Organizations: Not on file  . Attends Archivist Meetings: Not on file  . Marital Status: Not on file  Intimate Partner Violence:   . Fear of Current or Ex-Partner: Not on file  . Emotionally Abused: Not on file  . Physically Abused: Not on file  . Sexually Abused: Not on file    Past Medical History, Surgical history, Social history, and Family history were reviewed and updated as appropriate.   Please see review of systems for further details on the patient's review from today.   Objective:   Physical Exam:  BP 126/78   Pulse 73   Physical Exam Constitutional:      Appearance: Normal appearance. He is normal weight.  Neurological:     Mental Status: He is alert.     Motor: Weakness and tremor present.     Coordination: Coordination abnormal.     Gait: Gait abnormal.  Psychiatric:        Attention and Perception: He does not perceive auditory or visual hallucinations.        Mood and Affect: Mood is depressed. Mood is not anxious or elated. Affect is blunt. Affect is not tearful.        Speech: Speech is delayed.        Behavior: Behavior is slowed.        Thought Content: Thought content is not paranoid. Thought content does not include homicidal or suicidal ideation.         Cognition and Memory: Memory is impaired. He exhibits impaired recent memory.     Comments: Word finding problems "Not serious depression" Insight and judgment fair. Slowed responses significantly.  current president unknown. DOL Recall 2/3 Reduced volume      Lab Review:     Component Value Date/Time   NA 138 12/29/2018 1436   K 3.9 12/29/2018 1436   CL 105 12/29/2018 1436   CO2 28 12/29/2018 1436   GLUCOSE 108 (H) 12/29/2018 1436   GLUCOSE 105 (H) 12/03/2005 0000   BUN 26 (H) 12/29/2018 1436   CREATININE 1.41 12/29/2018 1436   CALCIUM 8.8 12/29/2018 1436   PROT 7.0 12/29/2018 1436   ALBUMIN 3.9 12/29/2018 1436   AST 13 12/29/2018 1436   ALT 4 12/29/2018 1436   ALKPHOS 75 12/29/2018 1436   BILITOT 0.8 12/29/2018 1436   GFRNONAA 49 (L) 06/19/2018 0826   GFRAA 57 (L) 06/19/2018 0826       Component Value Date/Time   WBC 6.6 12/29/2018 1436   RBC 4.20 (L) 12/29/2018 1436   HGB 13.2 12/29/2018 1436   HCT 39.8 12/29/2018 1436   PLT 150.0 12/29/2018 1436   MCV 94.9 12/29/2018 1436   MCH 31.5 06/19/2018 0826   MCHC 33.3 12/29/2018 1436   RDW 14.0 12/29/2018 1436   LYMPHSABS 1.5 12/29/2018 1436   MONOABS 0.7 12/29/2018 1436   EOSABS 0.2 12/29/2018 1436   BASOSABS 0.0 12/29/2018 1436    No results found for: POCLITH, LITHIUM   No results found for: PHENYTOIN, PHENOBARB, VALPROATE, CBMZ   .res Assessment:  Plan:    Xaviar was seen today for follow-up, depression, altered mental status, fatigue and sleeping problem.  Diagnoses and all orders for this visit:  Major depressive disorder, recurrent episode, moderate (HCC)  Mild cognitive impairment  Insomnia due to mental condition  Restless leg syndrome   Greater than 50% of 45 min face to face time with patient was spent on counseling and coordination of care. We discussed the complex and the relationship between depression, Parkinson's disease including the cognitive complications of it.  These are all  interrelated in his case.  Cognitive screening shows reduced attention and short-term memory issues but these are not severe.  He is cognitively slow and obviously physically slow due to the Parkinson's disease.  It is difficult to tell how much depression contributes but he does not acknowledge severe depression at this time.  His daughter feels like he is having a little more problems with confusion at the low-dose duloxetine.   Theoretically an SNRI such as duloxetine would have less likelihood of cognitive slowing then would an SSRI.  Consider change to more noradrenergic antidepressant like Pristiq.  They tried reducing the clonazepam but his confusion did not get better.  Therefore switching BZ probably won't help confusion which is probably Parkinson's dementia at this time.  We discussed the short-term risks associated with benzodiazepines including sedation and increased fall risk among others.  Discussed long-term side effect risk including dependence, potential withdrawal symptoms, and the potential eventual dose-related risk of dementia. Could increase the clonazepam to help the sleep again but risk tolerance and daytime cognitive problems.     Increase to 1 and 1/2 clonazepam of 0.5 mg at night for improving sleep.  Disc fall risk. Consider alt BZ if gets hangover or more SE.   Consider stimulant bc attention and off label depression and for more rapid response. He would like to pursue it to help more quickly with energy, endurance, focus.  He is particularly interested because of the potential of a rapid response.  Encouraged him to call back if the low-dose does nothing.  We discussed side effects in detail. Discussed potential benefits, risks, and side effects of stimulants with patient to include increased heart rate, palpitations, insomnia, increased anxiety, increased irritability, or decreased appetite.  Instructed patient to contact office if experiencing any significant  tolerability issues.  Ritalin 5 mg BID. She just wants to use it prn.  Enc it's use more regularly to see if cognitive benefit and functional benefit. Tolerates it but not much change.  She gives it to him and he sleeps after it for an hour and then feels better.  Encouraged physical, social, and mental stimulation to help offset some of his symptoms as well.  Still consider Aricept but one change at a time.  The treatment plan was discussed with both the patient and his daughter.  She had several questions and we discussed those and she is agreeable with the plan also.  FU 6 weeks  Lynder Parents, MD, DFAPA    Please see After Visit Summary for patient specific instructions.  Future Appointments  Date Time Provider [place]  05/29/2019  2:30 PM Tat, Eustace Quail, DO [facility] None    No orders of the defined types were placed in this encounter.   -------------------------------

## 2019-02-03 ENCOUNTER — Ambulatory Visit: Payer: Medicare Other | Attending: Internal Medicine

## 2019-02-03 ENCOUNTER — Encounter: Payer: Self-pay | Admitting: Internal Medicine

## 2019-02-03 ENCOUNTER — Telehealth: Payer: Self-pay

## 2019-02-03 DIAGNOSIS — K219 Gastro-esophageal reflux disease without esophagitis: Secondary | ICD-10-CM

## 2019-02-03 DIAGNOSIS — N401 Enlarged prostate with lower urinary tract symptoms: Secondary | ICD-10-CM

## 2019-02-03 DIAGNOSIS — K5900 Constipation, unspecified: Secondary | ICD-10-CM

## 2019-02-03 DIAGNOSIS — G3184 Mild cognitive impairment, so stated: Secondary | ICD-10-CM

## 2019-02-03 DIAGNOSIS — F331 Major depressive disorder, recurrent, moderate: Secondary | ICD-10-CM

## 2019-02-03 DIAGNOSIS — R3911 Hesitancy of micturition: Secondary | ICD-10-CM

## 2019-02-03 DIAGNOSIS — A048 Other specified bacterial intestinal infections: Secondary | ICD-10-CM

## 2019-02-03 DIAGNOSIS — Z23 Encounter for immunization: Secondary | ICD-10-CM | POA: Insufficient documentation

## 2019-02-03 NOTE — Telephone Encounter (Signed)
Daughter [name] dropped off FL2 forms for Dr. Ronnald Ramp to complete and call [name] [phone] when ready to pick up.

## 2019-02-03 NOTE — Telephone Encounter (Signed)
Copied from [place] [id number]. Topic: General - Other >> Feb 03, 2019 11:34 AM Virl Axe D wrote: Reason for CRM: Pt's daughter stated pt is going to be moving into [place] and needs Dr. Ronnald Ramp to fill out an FL2. Daughter will drop off paperwork today. Please contact when it is ready for pickup.

## 2019-02-03 NOTE — Progress Notes (Signed)
   Covid-19 Vaccination Clinic  Name:  Henry Adams    MRN: [id number] DOB: 1940-11-18  02/03/2019  [name] was observed post Covid-19 immunization for 15 minutes without incidence. He was provided with Vaccine Information Sheet and instruction to access the V-Safe system.   [name] was instructed to call 911 with any severe reactions post vaccine: [name] Difficulty breathing  . Swelling of your face and throat  . A fast heartbeat  . A bad rash all over your body  . Dizziness and weakness    Immunizations Administered    Name Date Dose VIS Date Route   Pfizer COVID-19 Vaccine 02/03/2019  9:17 AM 0.3 mL 01/02/2019 Intramuscular   Manufacturer: [place]   Lot: [id number]   [place]: [id number]

## 2019-02-04 ENCOUNTER — Telehealth: Payer: Self-pay

## 2019-02-04 ENCOUNTER — Encounter: Payer: Self-pay | Admitting: Internal Medicine

## 2019-02-04 NOTE — Telephone Encounter (Signed)
Copied from [place] [id number]. Topic: General - Inquiry >> Feb 04, 2019  2:58 PM Alease Frame wrote: Reason for [id number] daughter is calling in to sch an appt for patient for a TB shot . Please call daughter .   F/u   Please advise

## 2019-02-04 NOTE — Telephone Encounter (Signed)
Pt has been schedule for PPD placement -

## 2019-02-05 MED ORDER — VITAMIN B-12 1000 MCG PO TABS: ORAL_TABLET | ORAL | 3 refills | Status: DC

## 2019-02-05 MED ORDER — METHYLPHENIDATE HCL 5 MG PO TABS: ORAL_TABLET | ORAL | 0 refills | Status: DC

## 2019-02-05 MED ORDER — ALFUZOSIN HCL ER 10 MG PO TB24: ORAL_TABLET | ORAL | 3 refills | Status: DC

## 2019-02-05 MED ORDER — DOCUSATE SODIUM 100 MG PO CAPS: 100.0000 mg | ORAL_CAPSULE | Freq: Two times a day (BID) | ORAL | 11 refills | Status: DC

## 2019-02-05 MED ORDER — CARBIDOPA-LEVODOPA 25-100 MG PO TABS: ORAL_TABLET | ORAL | 3 refills | Status: DC

## 2019-02-05 MED ORDER — CARBIDOPA-LEVODOPA ER 25-100 MG PO TBCR: EXTENDED_RELEASE_TABLET | ORAL | 0 refills | Status: DC

## 2019-02-05 MED ORDER — ACETAMINOPHEN 500 MG PO TABS: ORAL_TABLET | ORAL | 3 refills | Status: AC

## 2019-02-05 MED ORDER — FEXOFENADINE HCL 180 MG PO TABS: ORAL_TABLET | ORAL | 3 refills | Status: DC

## 2019-02-05 MED ORDER — CLONAZEPAM 0.5 MG PO TABS: 0.5000 mg | ORAL_TABLET | Freq: Every day | ORAL | 1 refills | Status: DC

## 2019-02-05 MED ORDER — LUBIPROSTONE 24 MCG PO CAPS: ORAL_CAPSULE | ORAL | 3 refills | Status: DC

## 2019-02-05 MED ORDER — FINASTERIDE 5 MG PO TABS: ORAL_TABLET | ORAL | 3 refills | Status: DC

## 2019-02-05 MED ORDER — OMEPRAZOLE 40 MG PO CPDR: DELAYED_RELEASE_CAPSULE | ORAL | 3 refills | Status: DC

## 2019-02-05 MED ORDER — POLYETHYLENE GLYCOL 3350 17 G PO PACK: PACK | ORAL | 3 refills | Status: DC

## 2019-02-05 NOTE — Telephone Encounter (Signed)
Forms started yesterday afternoon. I have to up date the med list to reflect times meds are given and also document the TB placement and result.

## 2019-02-05 NOTE — Telephone Encounter (Signed)
I edited the medication list for pt dtr to reflect the times for each medication. Can you sign the orders "all are not to send to the pharmacy or print"?   Note: I know that you are rx'ing the methylphenidate - I will delete and readd after you sign.

## 2019-02-06 ENCOUNTER — Ambulatory Visit: Payer: Medicare Other

## 2019-02-09 ENCOUNTER — Other Ambulatory Visit: Payer: Self-pay

## 2019-02-09 ENCOUNTER — Telehealth: Payer: Self-pay | Admitting: Psychiatry

## 2019-02-09 ENCOUNTER — Ambulatory Visit: Payer: Medicare Other

## 2019-02-09 MED ORDER — METHYLPHENIDATE HCL 5 MG PO TABS: 5.0000 mg | ORAL_TABLET | Freq: Two times a day (BID) | ORAL | 0 refills | Status: DC

## 2019-02-09 NOTE — Telephone Encounter (Signed)
Daughter [name] requested a refill on Ritalin for [employer]. Fill at the [facility].Appt scheduled for 2/4.

## 2019-02-09 NOTE — Telephone Encounter (Signed)
Last refill 01/01/2019 Pended for approval by Dr. Clovis Pu

## 2019-02-23 ENCOUNTER — Ambulatory Visit: Payer: Medicare Other | Attending: Internal Medicine

## 2019-02-23 DIAGNOSIS — Z23 Encounter for immunization: Secondary | ICD-10-CM | POA: Insufficient documentation

## 2019-02-23 NOTE — Progress Notes (Signed)
   Covid-19 Vaccination Clinic  Name:  Henry Adams    MRN: [id number] DOB: 01/19/1941  02/23/2019  [name] was observed post Covid-19 immunization for 15 minutes without incidence. He was provided with Vaccine Information Sheet and instruction to access the V-Safe system.   [name] was instructed to call 911 with any severe reactions post vaccine: [name] Difficulty breathing  . Swelling of your face and throat  . A fast heartbeat  . A bad rash all over your body  . Dizziness and weakness    Immunizations Administered    Name Date Dose VIS Date Route   Pfizer COVID-19 Vaccine 02/23/2019  8:39 AM 0.3 mL 01/02/2019 Intramuscular   Manufacturer: [place]   Lot: [id number]   [place]: [id number]

## 2019-02-24 ENCOUNTER — Telehealth: Payer: Self-pay | Admitting: Neurology

## 2019-02-24 ENCOUNTER — Telehealth: Payer: Self-pay | Admitting: Psychiatry

## 2019-02-24 NOTE — Telephone Encounter (Signed)
Patient's daughter called and said taht her dad is very confused and angry and upset. She would like to talk to dr. Clovis Pu about what to do to help him. He has an appt on Thursday. Please give her a call at [phone]

## 2019-02-24 NOTE — Telephone Encounter (Signed)
Ask if the agitation and irritability is worse after taking the Ritalin and if so stop it.  In fact I would recommend that she stop it.  I am not sure there is anything we can do about the confusion.  I do not see how it is related to medications.

## 2019-02-24 NOTE — Telephone Encounter (Signed)
They should follow Dr. Beryle Quant advice.  His neurocognitive testing has not shown dementia so these would be a primary psychiatric issue.

## 2019-02-24 NOTE — Telephone Encounter (Signed)
Left [name] detailed voicemail with information, and I will reach out to her again in the morning.

## 2019-02-24 NOTE — Telephone Encounter (Signed)
Patient's daughter [name] called and lmom asking for you to please call her. Thank you

## 2019-02-24 NOTE — Telephone Encounter (Signed)
I spoke with daughter, she reports that pt is continuing to decline cognitively. She reports paranoia and delusional thinking. She asks if these psychiatric sx are 'normal' with parkinsonism? Pt saw dr Clovis Pu who started Ritalin. Pt isn't currently taking Aricept & daughter is calling to ask if she should restart it?  Last week she moved him into assisted living but he became agitated and she brought him back home on Sunday. He does not take his meds if left to his own accord. We discussed home health/companion resources, respite, HCPOA/Guardianship, and future plans for care. Daughter is overwhlemed and plans to meet tonight as a family to discuss plan. I am going to send her info about guardianship which she requested

## 2019-02-25 ENCOUNTER — Telehealth: Payer: Self-pay | Admitting: Clinical

## 2019-02-25 NOTE — Telephone Encounter (Signed)
F/u-daughter reports she talked with Dr Clovis Pu who will discontinue ritalin and restart Aricept. No plans re long-term care yet, I encouraged her to reach out re available resources once decided

## 2019-02-25 NOTE — Telephone Encounter (Signed)
Ok, I told daughter to follow his recs and that if she wants to restart Aricept to discuss w Dr Clovis Pu

## 2019-02-25 NOTE — Telephone Encounter (Signed)
Agreed.  Thanks.  

## 2019-02-26 ENCOUNTER — Other Ambulatory Visit: Payer: Self-pay

## 2019-02-26 ENCOUNTER — Encounter: Payer: Self-pay | Admitting: Psychiatry

## 2019-02-26 ENCOUNTER — Ambulatory Visit (INDEPENDENT_AMBULATORY_CARE_PROVIDER_SITE_OTHER): Payer: Medicare Other | Admitting: Psychiatry

## 2019-02-26 DIAGNOSIS — F331 Major depressive disorder, recurrent, moderate: Secondary | ICD-10-CM

## 2019-02-26 DIAGNOSIS — G2581 Restless legs syndrome: Secondary | ICD-10-CM | POA: Diagnosis not present

## 2019-02-26 DIAGNOSIS — F5105 Insomnia due to other mental disorder: Secondary | ICD-10-CM

## 2019-02-26 DIAGNOSIS — G3184 Mild cognitive impairment, so stated: Secondary | ICD-10-CM | POA: Diagnosis not present

## 2019-02-26 NOTE — Progress Notes (Signed)
[name] VW:[id number] Apr 07, 1940 79 y.o.  Subjective:   Patient ID:  Henry Adams is a 79 y.o. (DOB 06-13-1940) male.  Chief Complaint:  Chief Complaint  Patient presents with  . Follow-up    Medication Management  . Depression    Medication Management  . Altered Mental Status  . Memory Loss    Depression        Associated symptoms include decreased concentration.  Henry Adams presents to the office today for follow-up of depression.    First seen Oct 28 started Trintellix 5. Couldn't tolerate it DT dizziness and GI pain.  Resolved off it. Switched to duloxetine 12/6 20 mg daily.  seen January 01, 2019 with daughter present.  We continued recently started duloxetine 20 mg daily and added Ritalin 5 mg twice daily for augmentation and cognitive reasons.D stopped duloxetine 12/20 bc confusion which has gotten better 50% better but still depressed.   Last seen January 29, 2019 they were encouraged to try Ritalin augmentation for depression at 5 mg twice daily.  February 24, 2019 is daughter Henry Adams called stating he was "more confused, angry, and upset".  The only med change has been the addition of Ritalin and she was encouraged to stop that.  Patient was taking clonazepam 0.5 mg nightly and the option was offered to increase that to 0.75 mg nightly if needed.  Acknowledges sadness, irritable but not angry.  Not usually nervous.   Yesterday was first day off Ritalin and agitation was better, but evening is associated with anxiety and confusion.    Moved to independent living apt 2 weeks ago.  Has been difficult with a lot of anxiety over it.  Feels overwhelmed with expectations.  Calling D in middle of night confused. He doesn't remember doing so.  Pt denies being nervous there. Huge changes per D in last 3-4 weeks with greater irritability. [place].  Goes to bed about 930.    Clonazepam initially helped sleep but is not doing so now.  Dr. Carles Collet recently increase pm Sinemet to  help RLS and sleep but without much effect.  Did not restart donepezil bc multiple other med changes.  Tends to chronic constipation.    Not deeply depressed nor anxious.  Low energy.  Parkinson's interferes with normal activities.  Enjoys family activities.  Not reading much but can.  Watches some TV but less than in the past.  Past Psychiatric History: History of Lexapro 20 helped awhile but partially effective for anxiety and depression  Trintellix SE Duloxetine 20 Ritalin 5 BID Clonazepam helped anxiety and RLS and sleep History Wellbutrin XL 300,  trazodone for insomnia and didn't help the RLS. History of Seroquel 25 Rx.  Never taken. Melatonin dizzy  Review of Systems:  Review of Systems  HENT: Positive for voice change.   Gastrointestinal: Positive for constipation.  Neurological: Positive for dizziness, tremors and weakness.  Psychiatric/Behavioral: Positive for confusion, decreased concentration, depression and dysphoric mood.    Medications: I have reviewed the patient's current medications.  Current Outpatient Medications  Medication Sig Dispense Refill  . acetaminophen (TYLENOL) 500 MG tablet 500 mg prn for back, knee, shoulder or head pain. May be given up to every 6 hours. 90 tablet 3  . alfuzosin (UROXATRAL) 10 MG 24 hr tablet 10 mg at 7 pm daily. (Patient taking differently: Take 10 mg by mouth 2 (two) times daily. 10 mg at 7 pm daily.) 90 tablet 3  . carbidopa-levodopa (SINEMET IR) 25-100 MG tablet  1 tablet at 7am, 11am, 3pm and 7pm daily. May give 0.5 tablet in middle of night if needed. 135 tablet 3  . Carbidopa-Levodopa ER (SINEMET CR) 25-100 MG tablet controlled release 1 tablet just before bed. 90 tablet 0  . clonazePAM (KLONOPIN) 0.5 MG tablet Take 1 tablet (0.5 mg total) by mouth at bedtime. 90 tablet 1  . docusate sodium (COLACE) 100 MG capsule Take 1 capsule (100 mg total) by mouth 2 (two) times daily. 1 capsule at 7am and 3pm daily. May be stopped  temporarily if patient experiences loose stool. 30 capsule 11  . donepezil (ARICEPT) 5 MG tablet Take 5 mg by mouth at bedtime.    . fexofenadine (ALLEGRA) 180 MG tablet 180mg  tablet at 11 am. 90 tablet 3  . finasteride (PROSCAR) 5 MG tablet 5mg  at 7pm daily 90 tablet 3  . lubiprostone (AMITIZA) 24 MCG capsule 24 mcg capsule at 7am and 7pm daily. 180 capsule 3  . omeprazole (PRILOSEC) 40 MG capsule 40 mg at 7 am daily. 90 capsule 3  . polyethylene glycol (MIRALAX) 17 g packet 17 g up to 3 times per day as needed. May stop if patient has loose stool. 17 each 3  . vitamin B-12 (CYANOCOBALAMIN) 1000 MCG tablet 1000 mcg tablet at 11 am daily. 90 tablet 3   No current facility-administered medications for this visit.    Medication Side Effects: None obvious  Allergies:  Allergies  Allergen Reactions  . Iodine     rash  . Oxycodone     Mental status changes & nausea  . Pollen Extract Itching  . Tamiflu [Oseltamivir Phosphate] Other (See Comments)    hallucinations  . Tamsulosin     Other reaction(s): Other (See Comments) Unknown   . Doxazosin Rash    agitation  . Etodolac Rash    unknown  . Tape Rash    Certain "Band-Aids"    Past Medical History:  Diagnosis Date  . Allergy    perennial  . Anxiety   . Arthritis   . Cholelithiasis   . Chronic idiopathic constipation   . CKD (chronic kidney disease)   . Depression   . Diverticulosis   . Enlarged prostate   . Gallstones   . GERD (gastroesophageal reflux disease)   . HLD (hyperlipidemia)   . Hx of cardiovascular stress test    ETT-Myoview 6/14: probable low risk study, inf defect with normal inf wall motion-cannot r/o ischemia; prominent gut uptake adjacent to inf wall on rest images-poss defect is atten, EF 63%  . Hypertension   . Melanoma ([place])   . Parkinsonism ([place])   . Polycystic kidney disease     Family History  Problem Relation Age of Onset  . Hypertension Father   . Cancer Brother        mouth  . Cancer  Sister        breast  . Heart disease Brother        heart transplant  . Cancer Daughter        breast  . Stroke Sister        >49  . Skin cancer Brother        squamous cell  . Colon cancer Neg Hx     Social History   Socioeconomic History  . Marital status: Married    Spouse name: Not on file  . Number of children: 2  . Years of education: Not on file  . Highest education level: Not on file  Occupational History  . 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 Smoking status: Never Smoker  . Smokeless tobacco: Never Used  Substance and Sexual Activity  . Alcohol use: Not Currently    Alcohol/week: 0.0 standard drinks  . Drug use: No  . Sexual activity: Not on file  Other Topics Concern  . Not on file  Social History Narrative  . Not on file   Social Determinants of Health   Financial Resource Strain:   . Difficulty of Paying Living Expenses: Not on file  Food Insecurity:   . Worried About Charity fundraiser in the Last Year: Not on file  . Ran Out of Food in the Last Year: Not on file  Transportation Needs:   . Lack of Transportation (Medical): Not on file  . Lack of Transportation (Non-Medical): Not on file  Physical Activity:   . Days of Exercise per Week: Not on file  . Minutes of Exercise per Session: Not on file  Stress:   . Feeling of Stress : Not on file  Social Connections:   . Frequency of Communication with Friends and Family: Not on file  . Frequency of Social Gatherings with Friends and Family: Not on file  . Attends Religious Services: Not on file  . Active Member of Clubs or Organizations: Not on file  . Attends Archivist Meetings: Not on file  . Marital Status: Not on file  Intimate Partner Violence:   . Fear of Current or Ex-Partner: Not on file  . Emotionally Abused: Not on file  . Physically Abused: Not on file  . Sexually Abused: Not on file    Past Medical History, Surgical  history, Social history, and Family history were reviewed and updated as appropriate.   Please see review of systems for further details on the patient's review from today.   Objective:   Physical Exam:  There were no vitals taken for this visit.  Physical Exam Constitutional:      Appearance: Normal appearance. He is normal weight.  Neurological:     Mental Status: He is alert and oriented to person, place, and time.     Motor: Weakness and tremor present.     Coordination: Coordination abnormal.     Gait: Gait abnormal.  Psychiatric:        Attention and Perception: He does not perceive auditory or visual hallucinations.        Mood and Affect: Mood is depressed. Mood is not anxious or elated. Affect is blunt. Affect is not tearful.        Speech: Speech is delayed.        Behavior: Behavior is slowed.        Thought Content: Thought content is not paranoid. Thought content does not include homicidal or suicidal ideation.        Cognition and Memory: Memory is impaired. He exhibits impaired recent memory.     Comments: Word finding problems Mildly irritable Insight and judgment fair. Slowed responses significantly.  current president unknown.  Reduced volume and hard to understand at times. Responses intermittently make no sense.    Loses track of thoughts at time      Lab Review:     Component Value Date/Time   NA 138 12/29/2018 1436   K 3.9 12/29/2018 1436   CL 105 12/29/2018 1436   CO2 28 12/29/2018 1436   GLUCOSE 108 (H) 12/29/2018 1436  GLUCOSE 105 (H) 12/03/2005 0000   BUN 26 (H) 12/29/2018 1436   CREATININE 1.41 12/29/2018 1436   CALCIUM 8.8 12/29/2018 1436   PROT 7.0 12/29/2018 1436   ALBUMIN 3.9 12/29/2018 1436   AST 13 12/29/2018 1436   ALT 4 12/29/2018 1436   ALKPHOS 75 12/29/2018 1436   BILITOT 0.8 12/29/2018 1436   GFRNONAA 49 (L) 06/19/2018 0826   GFRAA 57 (L) 06/19/2018 0826       Component Value Date/Time   WBC 6.6 12/29/2018 1436    RBC 4.20 (L) 12/29/2018 1436   HGB 13.2 12/29/2018 1436   HCT 39.8 12/29/2018 1436   PLT 150.0 12/29/2018 1436   MCV 94.9 12/29/2018 1436   MCH 31.5 06/19/2018 0826   MCHC 33.3 12/29/2018 1436   RDW 14.0 12/29/2018 1436   LYMPHSABS 1.5 12/29/2018 1436   MONOABS 0.7 12/29/2018 1436   EOSABS 0.2 12/29/2018 1436   BASOSABS 0.0 12/29/2018 1436    No results found for: POCLITH, LITHIUM   No results found for: PHENYTOIN, PHENOBARB, VALPROATE, CBMZ   .res Assessment: Plan:    Satoru was seen today for follow-up, depression, altered mental status and memory loss.  Diagnoses and all orders for this visit:  Major depressive disorder, recurrent episode, moderate (HCC)  Mild cognitive impairment  Restless leg syndrome  Insomnia due to mental condition   Greater than 50% of 30 min face to face time with patient was spent on counseling and coordination of care. We discussed the complex and the relationship between depression, Parkinson's disease including the cognitive complications of it.  These are all interrelated in his case.  Cognitive screening shows reduced attention and short-term memory issues but these are not severe.  He is cognitively slow and obviously physically slow due to the Parkinson's disease.  It is difficult to tell how much depression contributes but he does not acknowledge severe depression at this time.  His daughter feels like he is having a little more problems with confusion at the low-dose duloxetine.   Theoretically an SNRI such as duloxetine would have less likelihood of cognitive slowing then would an SSRI.  Consider change to more noradrenergic antidepressant like Pristiq.  They tried reducing the clonazepam but his confusion did not get better.  Therefore switching BZ probably won't help confusion which is probably Parkinson's dementia at this time.  We discussed the short-term risks associated with benzodiazepines including sedation and increased fall risk  among others.  Discussed long-term side effect risk including dependence, potential withdrawal symptoms, and the potential eventual dose-related risk of dementia. Could increase the clonazepam to help the sleep again but risk tolerance and daytime cognitive problems.     Increase to 1 and 1/2 clonazepam of 0.5 mg at night for improving sleep.  Disc fall risk. Consider alt BZ if gets hangover or more SE.   Consider stimulant bc attention and off label depression and for more rapid response. He would like to pursue it to help more quickly with energy, endurance, focus.  He is particularly interested because of the potential of a rapid response.  Encouraged him to call back if the low-dose does nothing.  We discussed side effects in detail. Discussed potential benefits, risks, and side effects of stimulants with patient to include increased heart rate, palpitations, insomnia, increased anxiety, increased irritability, or decreased appetite.  Instructed patient to contact office if experiencing any significant tolerability issues.  Stay off Ritalin 5 mg BID DT agitation.   Encouraged physical, social, and  mental stimulation to help offset some of his symptoms as well.  Still consider Aricept but one change at a time.  The treatment plan was discussed with both the patient and his daughter.  She had several questions and we discussed those and she is agreeable with the plan also.  FU 6 weeks  Lynder Parents, MD, DFAPA    Please see After Visit Summary for patient specific instructions.  Future Appointments  Date Time Provider [place]  04/09/2019  2:30 PM Cottle, Billey Co., MD [facility] None  05/29/2019  2:30 PM Tat, Eustace Quail, DO [facility] None    No orders of the defined types were placed in this encounter.   -------------------------------

## 2019-03-06 ENCOUNTER — Other Ambulatory Visit: Payer: Self-pay

## 2019-03-06 ENCOUNTER — Telehealth: Payer: Self-pay | Admitting: Psychiatry

## 2019-03-06 ENCOUNTER — Ambulatory Visit (INDEPENDENT_AMBULATORY_CARE_PROVIDER_SITE_OTHER)
Admission: RE | Admit: 2019-03-06 | Discharge: 2019-03-06 | Disposition: A | Discharge status: Home / Self Care | Payer: Medicare Other | Source: Ambulatory Visit | Attending: Family | Admitting: Family

## 2019-03-06 ENCOUNTER — Ambulatory Visit (INDEPENDENT_AMBULATORY_CARE_PROVIDER_SITE_OTHER): Payer: Medicare Other | Admitting: Family

## 2019-03-06 VITALS — BP 148/88 | HR 77 | Temp 98.2°F | Ht 71.0 in | Wt 161.4 lb

## 2019-03-06 DIAGNOSIS — R410 Disorientation, unspecified: Secondary | ICD-10-CM | POA: Diagnosis not present

## 2019-03-06 DIAGNOSIS — R300 Dysuria: Secondary | ICD-10-CM

## 2019-03-06 DIAGNOSIS — R109 Unspecified abdominal pain: Secondary | ICD-10-CM | POA: Diagnosis not present

## 2019-03-06 LAB — COMPREHENSIVE METABOLIC PANEL
ALT: 8 U/L (ref 0–53)
AST: 16 U/L (ref 0–37)
Albumin: 3.8 g/dL (ref 3.5–5.2)
Alkaline Phosphatase: 80 U/L (ref 39–117)
BUN: 20 mg/dL (ref 6–23)
CO2: 29 mEq/L (ref 19–32)
Calcium: 8.8 mg/dL (ref 8.4–10.5)
Chloride: 103 mEq/L (ref 96–112)
Creatinine, Ser: 1.46 mg/dL (ref 0.40–1.50)
GFR: 46.34 mL/min — ABNORMAL LOW (ref >60.00)
Glucose, Bld: 91 mg/dL (ref 70–99)
Potassium: 4.3 mEq/L (ref 3.5–5.1)
Sodium: 137 mEq/L (ref 135–145)
Total Bilirubin: 0.8 mg/dL (ref 0.2–1.2)
Total Protein: 7.2 g/dL (ref 6.0–8.3)

## 2019-03-06 LAB — CBC WITH DIFFERENTIAL/PLATELET
Basophils Absolute: 0 10*3/uL (ref 0.0–0.1)
Basophils Relative: 0.8 % (ref 0.0–3.0)
Eosinophils Absolute: 0.1 10*3/uL (ref 0.0–0.7)
Eosinophils Relative: 2.3 % (ref 0.0–5.0)
HCT: 39.7 % (ref 39.0–52.0)
Hemoglobin: 13.2 g/dL (ref 13.0–17.0)
Lymphocytes Relative: 25.6 % (ref 12.0–46.0)
Lymphs Abs: 1.6 10*3/uL (ref 0.7–4.0)
MCHC: 33.1 g/dL (ref 30.0–36.0)
MCV: 94 fl (ref 78.0–100.0)
Monocytes Absolute: 0.7 10*3/uL (ref 0.1–1.0)
Monocytes Relative: 10.9 % (ref 3.0–12.0)
Neutro Abs: 3.7 10*3/uL (ref 1.4–7.7)
Neutrophils Relative %: 60.4 % (ref 43.0–77.0)
Platelets: 164 10*3/uL (ref 150.0–400.0)
RBC: 4.23 Mil/uL (ref 4.22–5.81)
RDW: 13.6 % (ref 11.5–15.5)
WBC: 6.1 10*3/uL (ref 4.0–10.5)

## 2019-03-06 LAB — POC URINALSYSI DIPSTICK (AUTOMATED)
Blood, UA: NEGATIVE
Glucose, UA: NEGATIVE
Ketones, UA: POSITIVE
Nitrite, UA: NEGATIVE
Protein, UA: POSITIVE — AB
Spec Grav, UA: 1.025 (ref 1.010–1.025)
Urobilinogen, UA: 0.2 E.U./dL
pH, UA: 6 (ref 5.0–8.0)

## 2019-03-06 MED ORDER — CLONAZEPAM 0.5 MG PO TABS: ORAL_TABLET | ORAL | 0 refills | Status: DC

## 2019-03-06 MED ORDER — CEFUROXIME AXETIL 500 MG PO TABS: 500.0000 mg | ORAL_TABLET | Freq: Two times a day (BID) | ORAL | 0 refills | Status: DC

## 2019-03-06 NOTE — Telephone Encounter (Signed)
At his last visit February 19, 2019 he was having some sleep issues and we increase clonazepam from 0.5 mg nightly to 0.5 mg tablets 1-1/2 nightly.  That is a sedative.  I would prefer not to mix sedatives if possible.  Therefore use clonazepam 0.5 mg tablets 1/2 to 1 tablet daily during the day in addition to the nighttime dose as needed for agitation.

## 2019-03-06 NOTE — Progress Notes (Signed)
Henry Adams is a 79 y.o. male with the following history as recorded in EpicCare:  Patient Active Problem List   Diagnosis Date Noted  . Ketonuria 12/29/2018  . Dark yellow-colored urine 12/29/2018  . Generalized abdominal pain 09/11/2018  . Benign prostatic hyperplasia with urinary hesitancy 06/25/2018  . Oral candida 06/23/2018  . Parkinsonism ([place])   . UTI (urinary tract infection) 06/16/2018  . CRI (chronic renal insufficiency), stage 3 (moderate) 03/27/2018  . Restless leg syndrome 01/23/2018  . Calculus of gallbladder with chronic cholecystitis without obstruction 09/18/2017  . Chronic idiopathic constipation 08/19/2017  . Insomnia 10/24/2016  . BPH associated with nocturia 08/11/2015  . Vocal cord atrophy 09/01/2013  . Essential hypertension 04/26/2007  . Allergic rhinitis 04/26/2007  . DEGENERATIVE JOINT DISEASE 04/26/2007  . G E R D 08/05/2006  . Helicobacter pylori infection 05/30/2006  . Hyperlipidemia with target LDL less than 160 05/30/2006  . Polycystic kidney 05/30/2006    Current Outpatient Medications  Medication Sig Dispense Refill  . acetaminophen (TYLENOL) 500 MG tablet 500 mg prn for back, knee, shoulder or head pain. May be given up to every 6 hours. 90 tablet 3  . alfuzosin (UROXATRAL) 10 MG 24 hr tablet 10 mg at 7 pm daily. (Patient taking differently: Take 10 mg by mouth 2 (two) times daily. 10 mg at 7 pm daily.) 90 tablet 3  . carbidopa-levodopa (SINEMET IR) 25-100 MG tablet 1 tablet at 7am, 11am, 3pm and 7pm daily. May give 0.5 tablet in middle of night if needed. 135 tablet 3  . Carbidopa-Levodopa ER (SINEMET CR) 25-100 MG tablet controlled release 1 tablet just before bed. 90 tablet 0  . clonazePAM (KLONOPIN) 0.5 MG tablet Take 1 tablet (0.5 mg total) by mouth at bedtime. 90 tablet 1  . docusate sodium (COLACE) 100 MG capsule Take 1 capsule (100 mg total) by mouth 2 (two) times daily. 1 capsule at 7am and 3pm daily. May be stopped temporarily if  patient experiences loose stool. 30 capsule 11  . donepezil (ARICEPT) 5 MG tablet Take 5 mg by mouth at bedtime.    . fexofenadine (ALLEGRA) 180 MG tablet 111m tablet at 11 am. 90 tablet 3  . finasteride (PROSCAR) 5 MG tablet 555mat 7pm daily 90 tablet 3  . lubiprostone (AMITIZA) 24 MCG capsule 24 mcg capsule at 7am and 7pm daily. 180 capsule 3  . omeprazole (PRILOSEC) 40 MG capsule 40 mg at 7 am daily. 90 capsule 3  . polyethylene glycol (MIRALAX) 17 g packet 17 g up to 3 times per day as needed. May stop if patient has loose stool. 17 each 3  . vitamin B-12 (CYANOCOBALAMIN) 1000 MCG tablet 1000 mcg tablet at 11 am daily. 90 tablet 3  . cefUROXime (CEFTIN) 500 MG tablet Take 1 tablet (500 mg total) by mouth 2 (two) times daily with a meal. 10 tablet 0   No current facility-administered medications for this visit.    Allergies: Iodine, Oxycodone, Pollen extract, Tamiflu [oseltamivir phosphate], Tamsulosin, Doxazosin, Etodolac, and Tape  Past Medical History:  Diagnosis Date  . Allergy    perennial  . Anxiety   . Arthritis   . Cholelithiasis   . Chronic idiopathic constipation   . CKD (chronic kidney disease)   . Depression   . Diverticulosis   . Enlarged prostate   . Gallstones   . GERD (gastroesophageal reflux disease)   . HLD (hyperlipidemia)   . Hx of cardiovascular stress test    ETT-Myoview  6/14: probable low risk study, inf defect with normal inf wall motion-cannot r/o ischemia; prominent gut uptake adjacent to inf wall on rest images-poss defect is atten, EF 63%  . Hypertension   . Melanoma (HCC)   . Parkinsonism (HCC)   . Polycystic kidney disease     Past Surgical History:  Procedure Laterality Date  . COLONOSCOPY  2011   diverticulosis  . HERNIA REPAIR     umbilical  . KNEE SURGERY Left   . medialization laryngoplasty  10/15/2013   Dr Wright, [facility]  . TONSILLECTOMY AND ADENOIDECTOMY      Family History  Problem Relation Age of Onset  . Hypertension Father    . Cancer Brother        mouth  . Cancer Sister        breast  . Heart disease Brother        heart transplant  . Cancer Daughter        breast  . Stroke Sister        >65  . Skin cancer Brother        squamous cell  . Colon cancer Neg Hx     Social History   Tobacco Use  . Smoking status: Never Smoker  . Smokeless tobacco: Never Used  Substance Use Topics  . Alcohol use: Not Currently    Alcohol/week: 0.0 standard drinks    Subjective:  Patient is brought to the office by his daughter today; per daughter, patient has seemed more confused lately and she is worried that he is dehydrated/ has a UTI;  Patient has recently moved into a new apartment and admits he has not been drinking enough water.  Patient is also requesting to get abdominal x-ray to make sure there is not a bowel obstruction.      Objective:  Vitals:   03/06/19 1426  BP: (!) 148/88  Pulse: 77  Temp: 98.2 F (36.8 C)  TempSrc: Oral  SpO2: 99%  Weight: 161 lb 6.4 oz (73.2 kg)  Height: 5' 11" (1.803 m)    General: Well developed, well nourished, in no acute distress  Skin : Warm and dry.  Head: Normocephalic and atraumatic  Lungs: Respirations unlabored;  CVS exam: normal rate and regular rhythm.  Neurologic: Alert and oriented; speech intact; face symmetrical; moves all extremities well; CNII-XII intact without focal deficit   Assessment:  1. Dysuria   2. Abdominal pain, unspecified abdominal location   3. Confusion     Plan:  Check U/A and urine culture; will go ahead and start treatment for suspected UTI; Rx for Ceftin 500 mg bid x 5 days; Update abdominal X-ray today per patient request to make sure no bowel obstruction. Encouraged to work on increased fluid intake, trial of Ensure or Carnation Instant Breakfast to help with early satiety symptoms from solid foods; Follow-up to be determined  This visit occurred during the SARS-CoV-2 public health emergency.  Safety protocols were in place,  including screening questions prior to the visit, additional usage of staff PPE, and extensive cleaning of exam room while observing appropriate contact time as indicated for disinfecting solutions.     No follow-ups on file.  Orders Placed This Encounter  Procedures  . DG Abd 2 Views    Standing Status:   Future    Standing Expiration Date:   05/03/2020    Order Specific Question:   Reason for Exam (SYMPTOM  OR DIAGNOSIS REQUIRED)    Answer:   abdominal pain      Order Specific Question:   Preferred imaging location?    Answer:   [facility]    Order Specific Question:   Radiology Contrast Protocol - do NOT remove file path    Answer:   [email or web address]  . CBC w/Diff  . Comp Met (CMET)  . POCT Urinalysis Dipstick (Automated)    Requested Prescriptions   Signed Prescriptions Disp Refills  . cefUROXime (CEFTIN) 500 MG tablet 10 tablet 0    Sig: Take 1 tablet (500 mg total) by mouth 2 (two) times daily with a meal.     

## 2019-03-06 NOTE — Telephone Encounter (Signed)
Daughter wanted to let you know that he has had more cognitive decline and is awaiting a spot to get placed in assisted living. Daughter knows she has been resistant to meds for Dad because of his sensitivities but he really needs something for anxiety.  He needs something to help him calm down.

## 2019-03-06 NOTE — Telephone Encounter (Signed)
Rtc to [name], explained the dosing for Clonazepam 0.5 mg during the day, discussed how many hours in between doses. Informed her it's safe for him to take. Advised her if he's having a certain routine, such as every day around lunch he's getting restless/agitated to try to give prior before it escalates because the medication will take a little time to work. If he's not then try to give when he's starting to show first symptoms. She agreed and appreciative. She asked that an updated Rx be sent to [facility]. She was not home at the time and was unable to verify how many he has left.

## 2019-03-06 NOTE — Addendum Note (Signed)
Addended by: Cresenciano Lick on: 03/06/2019 04:13 PM   Modules accepted: Orders

## 2019-03-07 ENCOUNTER — Telehealth: Payer: Self-pay | Admitting: Internal Medicine

## 2019-03-07 LAB — URINE CULTURE: Result:: NO GROWTH

## 2019-03-07 NOTE — Telephone Encounter (Signed)
PC from RN at [facility]  Started antibiotic for UTI. Now with diarrhea with the second dose. They ask about imodium vs probiotic  Instructed to take the antibiotic on a full stomach--but still stop it if the diarrhea worsens. No imodium but okay to try probiiotic Avoid miralax while bowels are loose

## 2019-03-10 ENCOUNTER — Encounter: Payer: Self-pay | Admitting: Family

## 2019-03-11 ENCOUNTER — Other Ambulatory Visit: Payer: Self-pay | Admitting: Internal Medicine

## 2019-03-11 DIAGNOSIS — K219 Gastro-esophageal reflux disease without esophagitis: Secondary | ICD-10-CM

## 2019-03-11 DIAGNOSIS — A048 Other specified bacterial intestinal infections: Secondary | ICD-10-CM

## 2019-03-24 ENCOUNTER — Telehealth: Payer: Self-pay | Admitting: Clinical

## 2019-03-24 NOTE — Telephone Encounter (Signed)
Daughter called & requested appt for pt with Dr Teena Irani states that pt is declining, currently in independent living but she thinks he may need assisted living. Also the family took keys and pt no longer driving. I told her I would forward her request for appt for your review.

## 2019-03-24 NOTE — Telephone Encounter (Signed)
Any thoughts here?  Last neurocog testing was not even a year ago and was [facility].  Daughter thinks that he has had major changes since then but not so sure that insurance will pay for more neurocog testing this soon.

## 2019-03-24 NOTE — Telephone Encounter (Signed)
Spoke with patients daughter, [name], she states she wants patient to have an appointment so we can tell him that he needs to move to the assisted living side instead of him staying on the independent side. She states patient has gotten more confused since November. She states the family has tried to convince the patient that he needs more help then he admits.  I advised he to follow up with the patients PCP to see if they can help him. She stated that she reached out to Dr Clovis Pu and he prescribed the patient some meds. She also states that patient has a sitter that sits with him most of the day and he is only alone for 4-6 hours a day and he does not like having a sitter. She also states patient is not taking his meds as prescribed because he is confused and does not think the meds are his.

## 2019-03-24 NOTE — Telephone Encounter (Signed)
What specifically can I help them with?  Had neurocog testing in December that didn't show dementia, but if they feel that he needs higher level of care, then certainly they should move forward with that.  I saw him in December so trying to figure out how specifically I can help them.  Pt sees psychiatry as well.

## 2019-03-25 NOTE — Telephone Encounter (Signed)
I saw him in August. He certainly had some weaknesses, but denied any trouble performing instrumental ADLs, which is where the MCI diagnosis largely came from. Now he was unaccompanied during the interview, so it is wholly possible that he actively minimized functional difficulties when talking to me. However, when we went over these results in his feedback session, his daughter was there and I didn't document any pushback or differing viewpoints in that note. It's been about 6 months since his eval. We generally like to wait 12 months before doing repeat testing so we lessen the concern about practice effects. However, if his daughter really wants him to be evaluated again, we could do it. I have some alternate tests we could use. As you said, it will be important for her to check with his insurance provider. Sometimes they won't cover more than 1 eval in a 12 month period.

## 2019-03-30 ENCOUNTER — Other Ambulatory Visit: Payer: Self-pay | Admitting: Neurology

## 2019-04-01 ENCOUNTER — Telehealth: Payer: Self-pay | Admitting: Neurology

## 2019-04-01 ENCOUNTER — Encounter: Payer: Self-pay | Admitting: Internal Medicine

## 2019-04-01 ENCOUNTER — Telehealth: Payer: Self-pay

## 2019-04-01 NOTE — Telephone Encounter (Signed)
Tee, see prior phone call.  This likely has to do with repeat neurocog testing and I believe Henry Adams was looking into it because when she started looking into it the last one hadn't been paid by insurance yet I think?

## 2019-04-01 NOTE — Telephone Encounter (Signed)
Daughter left msg with after hours about needing to speak with Darden Dates about a test for patient. Please call her back at [phone]. Thanks!

## 2019-04-01 NOTE — Telephone Encounter (Signed)
New message    Daughter [name] calling needs Dr. Ronnald Ramp CMA to call her back to discuss.    1. Update FL2  2. TB shot   3. C/o sleep issues

## 2019-04-02 ENCOUNTER — Encounter: Payer: Self-pay | Admitting: Internal Medicine

## 2019-04-02 NOTE — Telephone Encounter (Signed)
[name] contacted and scheduled with PCP and we will do the PPD placement.   We will complete the FL2 and have the supplemental orders completed prior to visit.

## 2019-04-02 NOTE — Telephone Encounter (Signed)
Called and left message for daughter to call me back regarding her wanting patient to have additional neuropsychological testing. Insurance did not pay for the testing he had back in Aug. 2020. It has not been a year since the last testing therefore insurance will more than likely not pay for the patient to have the test repeated. I will be happy to speak with the daughter however the recommendation is that she communicate with the patients' psychiatrist and PCP regarding her concerns.

## 2019-04-02 NOTE — Telephone Encounter (Signed)
[name] contacted and scheduled with PCP and we will do the PPD placement.    We will complete the FL2 and have the supplemental orders completed prior to visit.

## 2019-04-02 NOTE — Telephone Encounter (Signed)
Glass blower/designer is aware of insurance concerns and will follow up.

## 2019-04-03 ENCOUNTER — Telehealth: Payer: Self-pay | Admitting: Internal Medicine

## 2019-04-03 NOTE — Telephone Encounter (Signed)
Do you have the FL2 and the supplemental order form for this patient?

## 2019-04-03 NOTE — Telephone Encounter (Signed)
New message:    Denton Ar is calling and states she would like to speak with you about some behaviors she is seeing in the patient. Please advise.

## 2019-04-04 NOTE — Telephone Encounter (Signed)
Tried to call, not able the vm for Brianna.

## 2019-04-05 ENCOUNTER — Other Ambulatory Visit: Payer: Self-pay | Admitting: Neurology

## 2019-04-07 ENCOUNTER — Encounter: Payer: Self-pay | Admitting: Internal Medicine

## 2019-04-07 NOTE — Telephone Encounter (Signed)
Called [place] and was transferred to [place]. I told her who I was and why I was calling. She stated that I needed to speak to [facility]. Left my name and number for Brianna RN to call back.

## 2019-04-08 ENCOUNTER — Other Ambulatory Visit: Payer: Self-pay

## 2019-04-08 ENCOUNTER — Encounter: Payer: Self-pay | Admitting: Internal Medicine

## 2019-04-08 ENCOUNTER — Ambulatory Visit (INDEPENDENT_AMBULATORY_CARE_PROVIDER_SITE_OTHER): Payer: Medicare Other | Admitting: Internal Medicine

## 2019-04-08 VITALS — BP 120/80 | HR 69 | Temp 98.4°F | Resp 16 | Ht 71.0 in | Wt 160.0 lb

## 2019-04-08 DIAGNOSIS — K5904 Chronic idiopathic constipation: Secondary | ICD-10-CM

## 2019-04-08 DIAGNOSIS — F5104 Psychophysiologic insomnia: Secondary | ICD-10-CM | POA: Diagnosis not present

## 2019-04-08 DIAGNOSIS — R3911 Hesitancy of micturition: Secondary | ICD-10-CM

## 2019-04-08 DIAGNOSIS — N401 Enlarged prostate with lower urinary tract symptoms: Secondary | ICD-10-CM | POA: Diagnosis not present

## 2019-04-08 DIAGNOSIS — Z111 Encounter for screening for respiratory tuberculosis: Secondary | ICD-10-CM

## 2019-04-08 MED ORDER — ALFUZOSIN HCL ER 10 MG PO TB24: 10.0000 mg | ORAL_TABLET | Freq: Two times a day (BID) | ORAL | Status: AC

## 2019-04-08 NOTE — Progress Notes (Signed)
Subjective:  Patient ID: Henry Adams, male    DOB: [birth year]  Age: 79 y.o. MRN: [id number]  CC: Insomnia  This visit occurred during the SARS-CoV-2 public health emergency.  Safety protocols were in place, including screening questions prior to the visit, additional usage of staff PPE, and extensive cleaning of exam room while observing appropriate contact time as indicated for disinfecting solutions.    HPI Henry Adams presents for f/up - He comes in with his daughter today.  He is being transferred to a memory unit.  He needs to undergo screening for tuberculosis.  He is having trouble sleeping despite taking Klonopin at bedtime.  He continues to struggle with intermittent abdominal pain, bloating, and constipation.  His daughter wants him to see GI again.  Outpatient Medications Prior to Visit  Medication Sig Dispense Refill  . acetaminophen (TYLENOL) 500 MG tablet 500 mg prn for back, knee, shoulder or head pain. May be given up to every 6 hours. 90 tablet 3  . carbidopa-levodopa (SINEMET IR) 25-100 MG tablet TAKE 1 TABLET AT 7AM, 11AM, 3PM AND TAKE 1&1/2 TABLETS AT 7PM. 135 tablet 3  . Carbidopa-Levodopa ER (SINEMET CR) 25-100 MG tablet controlled release TAKE ONE TABLET AT BEDTIME. 90 tablet 1  . clonazePAM (KLONOPIN) 0.5 MG tablet Take 1/2-1 tablet by mouth during the day as needed for anxiety/agitation and take 1.5 tablets at bedtime as needed for sleep/anxiety (Patient taking differently: Take 1/2 tablet by mouth during the day as needed for anxiety/agitation(taken at 3PM)  and take 1.0 tablets at bedtime as needed for sleep/anxiety) 75 tablet 0  . docusate sodium (COLACE) 100 MG capsule Take 1 capsule (100 mg total) by mouth 2 (two) times daily. 1 capsule at 7am and 3pm daily. May be stopped temporarily if patient experiences loose stool. 30 capsule 11  . fexofenadine (ALLEGRA) 180 MG tablet 180mg  tablet at 11 am. 90 tablet 3  . finasteride (PROSCAR) 5 MG tablet 5mg  at 7pm daily 90  tablet 3  . lubiprostone (AMITIZA) 24 MCG capsule 24 mcg capsule at 7am and 7pm daily. 180 capsule 3  . omeprazole (PRILOSEC) 40 MG capsule TAKE (1) CAPSULE DAILY. 90 capsule 0  . polyethylene glycol (MIRALAX) 17 g packet 17 g up to 3 times per day as needed. May stop if patient has loose stool. 17 each 3  . vitamin B-12 (CYANOCOBALAMIN) 1000 MCG tablet 1000 mcg tablet at 11 am daily. 90 tablet 3  . alfuzosin (UROXATRAL) 10 MG 24 hr tablet 10 mg at 7 pm daily. (Patient taking differently: Take 10 mg by mouth 2 (two) times daily. 10 mg at 7 am and 7 pm daily.) 90 tablet 3  . donepezil (ARICEPT) 5 MG tablet Take 5 mg by mouth at bedtime.    . Probiotic Product (PROBIOTIC ADVANCED PO) Take 1 capsule by mouth daily. 1 capsule daily at 3pm     No facility-administered medications prior to visit.    ROS Review of Systems  Constitutional: Positive for fatigue. Negative for diaphoresis and unexpected weight change.  HENT: Negative.   Eyes: Negative for visual disturbance.  Respiratory: Negative.  Negative for cough and shortness of breath.   Cardiovascular: Negative.  Negative for chest pain, palpitations and leg swelling.  Gastrointestinal: Negative for abdominal pain, constipation, diarrhea, nausea and vomiting.  Endocrine: Negative.   Genitourinary: Negative.  Negative for difficulty urinating.  Musculoskeletal: Positive for gait problem. Negative for arthralgias, back pain and myalgias.  Skin: Negative.  Negative  for color change, pallor and rash.  Neurological: Negative for dizziness, weakness, light-headedness and numbness.  Hematological: Negative for adenopathy. Does not bruise/bleed easily.  Psychiatric/Behavioral: Positive for confusion, decreased concentration and sleep disturbance. Negative for self-injury and suicidal ideas. The patient is nervous/anxious.     Objective:  BP 120/80 (BP Location: Left Arm, Patient Position: Sitting, Cuff Size: Normal)   Pulse 69   Temp 98.4 F  (36.9 C) (Oral)   Resp 16   Ht 5\' 11"  (1.803 m)   Wt 160 lb (72.6 kg)   SpO2 95%   BMI 22.32 kg/m   BP Readings from Last 3 Encounters:  04/08/19 120/80  03/06/19 (!) 148/88  12/29/18 (!) 152/90    Wt Readings from Last 3 Encounters:  04/08/19 160 lb (72.6 kg)  03/06/19 161 lb 6.4 oz (73.2 kg)  12/29/18 154 lb (69.9 kg)    Physical Exam Vitals reviewed.  HENT:     Mouth/Throat:     Mouth: Mucous membranes are moist.  Eyes:     General: No scleral icterus.    Conjunctiva/sclera: Conjunctivae normal.  Cardiovascular:     Rate and Rhythm: Normal rate and regular rhythm.     Heart sounds: No murmur.  Pulmonary:     Effort: Pulmonary effort is normal.     Breath sounds: No stridor. No wheezing, rhonchi or rales.  Abdominal:     General: Abdomen is flat. Bowel sounds are normal. There is no distension.     Palpations: Abdomen is soft. There is no hepatomegaly, splenomegaly or mass.     Tenderness: There is no abdominal tenderness.  Musculoskeletal:     Cervical back: Neck supple.  Lymphadenopathy:     Cervical: No cervical adenopathy.  Neurological:     Mental Status: He is alert.  Psychiatric:        Attention and Perception: He is inattentive.        Mood and Affect: Mood is depressed. Affect is flat. Affect is not inappropriate.        Speech: Speech is delayed and tangential. Speech is not slurred.        Behavior: Behavior is cooperative.        Thought Content: Thought content normal. Thought content is not paranoid. Thought content does not include homicidal or suicidal ideation.        Cognition and Memory: Cognition is impaired. Memory is impaired. He exhibits impaired recent memory and impaired remote memory.     Lab Results  Component Value Date   WBC 6.1 03/06/2019   HGB 13.2 03/06/2019   HCT 39.7 03/06/2019   PLT 164.0 03/06/2019   GLUCOSE 91 03/06/2019   CHOL 179 11/14/2016   TRIG 73.0 11/14/2016   HDL 68.50 11/14/2016   LDLDIRECT 131.7  07/02/2011   LDLCALC 96 11/14/2016   ALT 8 03/06/2019   AST 16 03/06/2019   NA 137 03/06/2019   K 4.3 03/06/2019   CL 103 03/06/2019   CREATININE 1.46 03/06/2019   BUN 20 03/06/2019   CO2 29 03/06/2019   TSH 1.36 06/25/2018   PSA 2.28 08/11/2015   INR 1.1 (H) 08/01/2012   HGBA1C 5.8 12/29/2018    DG Abd 2 Views  Result Date: 03/06/2019 CLINICAL DATA:  Abdominal pain, bloating, lower abdominal tightness EXAM: ABDOMEN - 2 VIEW COMPARISON:  09/11/2018 FINDINGS: Supine and upright frontal views of the abdomen and pelvis are obtained, excluding the pubic symphysis by collimation. Bowel gas pattern is unremarkable. No obstruction or  ileus. No free gas in the greater peritoneal sac. No masses or abnormal calcifications. IMPRESSION: 1. Unremarkable bowel gas pattern. Electronically Signed   By: Randa Ngo M.D.   On: 03/06/2019 19:57    Assessment & Plan:   Jaidan was seen today for insomnia.  Diagnoses and all orders for this visit:  Screening-pulmonary TB -     PPD  Benign prostatic hyperplasia with urinary hesitancy- He is urinating without hesitation.  Will continue the current dose of Uroxatrol. -     alfuzosin (UROXATRAL) 10 MG 24 hr tablet; Take 1 tablet (10 mg total) by mouth 2 (two) times daily. 10 mg at 7 am and 7 pm daily.  Chronic idiopathic constipation -     Ambulatory referral to Gastroenterology  Psychophysiological insomnia -     Discontinue: Suvorexant (BELSOMRA) 15 MG TABS; Take 1 tablet by mouth at bedtime as needed. -     Suvorexant (BELSOMRA) 15 MG TABS; Take 1 tablet by mouth at bedtime as needed.   I have changed Josie Saunders. [place]'s alfuzosin. I am also having him maintain his lubiprostone, fexofenadine, finasteride, docusate sodium, acetaminophen, vitamin B-12, polyethylene glycol, clonazePAM, omeprazole, carbidopa-levodopa, Carbidopa-Levodopa ER, and Belsomra.  Meds ordered this encounter  Medications  . alfuzosin (UROXATRAL) 10 MG 24 hr tablet    Sig:  Take 1 tablet (10 mg total) by mouth 2 (two) times daily. 10 mg at 7 am and 7 pm daily.  [name] DISCONTD: Suvorexant (BELSOMRA) 15 MG TABS    Sig: Take 1 tablet by mouth at bedtime as needed.    Dispense:  30 tablet    Refill:  5  . Suvorexant (BELSOMRA) 15 MG TABS    Sig: Take 1 tablet by mouth at bedtime as needed.    Dispense:  30 tablet    Refill:  5     Follow-up: No follow-ups on file.  Scarlette Calico, MD

## 2019-04-09 ENCOUNTER — Ambulatory Visit (INDEPENDENT_AMBULATORY_CARE_PROVIDER_SITE_OTHER): Payer: Medicare Other | Admitting: Psychiatry

## 2019-04-09 ENCOUNTER — Encounter: Payer: Self-pay | Admitting: Psychiatry

## 2019-04-09 DIAGNOSIS — G3184 Mild cognitive impairment, so stated: Secondary | ICD-10-CM

## 2019-04-09 MED ORDER — DONEPEZIL HCL 10 MG PO TABS: 10.0000 mg | ORAL_TABLET | Freq: Every day | ORAL | 1 refills | Status: AC

## 2019-04-09 NOTE — Progress Notes (Signed)
FODAY BRADEN VW:[id number] March 22, 1940 79 y.o.  Subjective:   Patient ID:  Henry Adams is a 79 y.o. (DOB [birth year]) male.  Chief Complaint:  Chief Complaint  Patient presents with  . Follow-up     Medication Management  . Depression     Medication Management    Depression        Associated symptoms include decreased concentration.  Henry Adams presents to the office today for follow-up of depression.    First seen Oct 28 started Trintellix 5. Couldn't tolerate it DT dizziness and GI pain.  Resolved off it. Switched to duloxetine 12/6 20 mg daily.  seen January 01, 2019 with daughter present.  We continued recently started duloxetine 20 mg daily and added Ritalin 5 mg twice daily for augmentation and cognitive reasons.Henry Adams stopped duloxetine 12/20 bc confusion which has gotten better 50% better but still depressed.   seen January 29, 2019 they were encouraged to try Ritalin augmentation for depression at 5 mg twice daily.  February 24, 2019 is daughter Henry Adams called stating he was "more confused, angry, and upset".  The only med change has been the addition of Ritalin and she was encouraged to stop that.  Patient was taking clonazepam 0.5 mg nightly and the option was offered to increase that to 0.75 mg nightly if needed  Last seen February 26, 2019.Henry Adams  Ritalin had been stopped due to agitation. Yesterday was first day off Ritalin and agitation was better, but evening is associated with anxiety and confusion.   It was having sleep issues and the following was changed: Increase to 1 and 1/2 clonazepam of 0.5 mg at night for improving sleep.  Daughter called February 12 noting more cognitive decline.  Stating he needs something to calm him down because of anxiety and agitation.  She was given the following information: At his last visit February 19, 2019 he was having some sleep issues and we increase clonazepam from 0.5 mg nightly to 0.5 mg tablets 1-1/2 nightly.  That is a sedative.  I would  prefer not to mix sedatives if possible.  Therefore use clonazepam 0.5 mg tablets 1/2 to 1 tablet daily during the day in addition to the nighttime dose as needed for agitation.  As of visit 04/09/19 comes with daughter for history. Henry Adams. Dizzy in AM and balance problems with clonazepam 0.75 mg HS. Tolerated clonazepam 0.25 mg added at 3 pm without dizziness and it helped calm him.   "I feel pretty good" today.  Clonazepam added at 3 PM did resolve the agitation in the evening so far and is tolerated.  He is not markedly depressed at this time.  Moved to independent living apt January 2021. Going to as[facility] living [facility] [place] next week.   Has been difficult with a lot of anxiety over it.  Feels overwhelmed with expectations.  Calling Henry Adams in middle of night confused. He doesn't remember doing so.  Pt denies being nervous there. Huge changes per Henry Adams in last 3-4 weeks with greater irritability. [place].  Goes to bed about 930.    Not deeply depressed nor anxious.  Low energy.  Parkinson's interferes with normal activities.  Enjoys family activities.  Not reading much but can.  Watches some TV but less than in the past.  Past Psychiatric History: History of Lexapro 20 helped awhile but partially effective for anxiety and depression  Trintellix SE after 4-5 week without benefit.  Duloxetine 20 Ritalin 5 BID Clonazepam helped anxiety and  RLS and sleep History Wellbutrin XL 300,  trazodone for insomnia and didn't help the RLS. History of Seroquel 25 Rx.  Never taken. Melatonin dizzy  Review of Systems:  Review of Systems  HENT: Positive for voice change.   Gastrointestinal: Positive for abdominal distention and constipation. Negative for nausea.  Neurological: Positive for tremors and weakness. Negative for dizziness.  Psychiatric/Behavioral: Positive for confusion, decreased concentration, depression and dysphoric mood.    Medications: I have reviewed the patient's  current medications.  Current Outpatient Medications  Medication Sig Dispense Refill  . acetaminophen (TYLENOL) 500 MG tablet 500 mg prn for back, knee, shoulder or head pain. May be given up to every 6 hours. 90 tablet 3  . alfuzosin (UROXATRAL) 10 MG 24 hr tablet Take 1 tablet (10 mg total) by mouth 2 (two) times daily. 10 mg at 7 am and 7 pm daily.    . carbidopa-levodopa (SINEMET IR) 25-100 MG tablet TAKE 1 TABLET AT 7AM, 11AM, 3PM AND TAKE 1&1/2 TABLETS AT 7PM. 135 tablet 3  . Carbidopa-Levodopa ER (SINEMET CR) 25-100 MG tablet controlled release TAKE ONE TABLET AT BEDTIME. 90 tablet 1  . clonazePAM (KLONOPIN) 0.5 MG tablet Take 1/2-1 tablet by mouth during the day as needed for anxiety/agitation and take 1.5 tablets at bedtime as needed for sleep/anxiety (Patient taking differently: Take 1/2 tablet by mouth during the day as needed for anxiety/agitation(taken at 3PM)  and take 1.0 tablets at bedtime as needed for sleep/anxiety) 75 tablet 0  . docusate sodium (COLACE) 100 MG capsule Take 1 capsule (100 mg total) by mouth 2 (two) times daily. 1 capsule at 7am and 3pm daily. May be stopped temporarily if patient experiences loose stool. 30 capsule 11  . donepezil (ARICEPT) 10 MG tablet Take 1 tablet (10 mg total) by mouth at bedtime. 90 tablet 1  . fexofenadine (ALLEGRA) 180 MG tablet 180mg  tablet at 11 am. 90 tablet 3  . finasteride (PROSCAR) 5 MG tablet 5mg  at 7pm daily 90 tablet 3  . lubiprostone (AMITIZA) 24 MCG capsule 24 mcg capsule at 7am and 7pm daily. 180 capsule 3  . omeprazole (PRILOSEC) 40 MG capsule TAKE (1) CAPSULE DAILY. 90 capsule 0  . polyethylene glycol (MIRALAX) 17 g packet 17 g up to 3 times per day as needed. May stop if patient has loose stool. 17 each 3  . Probiotic Product ([place]) Take by mouth.    . vitamin B-12 (CYANOCOBALAMIN) 1000 MCG tablet 1000 mcg tablet at 11 am daily. 90 tablet 3   No current facility-administered medications for this visit.     Medication Side Effects: None obvious  Allergies:  Allergies  Allergen Reactions  . Iodine     rash  . Oxycodone     Mental status changes & nausea  . Pollen Extract Itching  . Tamiflu [Oseltamivir Phosphate] Other (See Comments)    hallucinations  . Tamsulosin     Other reaction(s): Other (See Comments) Unknown   . Doxazosin Rash    agitation  . Etodolac Rash    unknown  . Tape Rash    Certain "Band-Aids"    Past Medical History:  Diagnosis Date  . Allergy    perennial  . Anxiety   . Arthritis   . Cholelithiasis   . Chronic idiopathic constipation   . CKD (chronic kidney disease)   . Depression   . Diverticulosis   . Enlarged prostate   . Gallstones   . GERD (gastroesophageal reflux  disease)   . HLD (hyperlipidemia)   . Hx of cardiovascular stress test    ETT-Myoview 6/14: probable low risk study, inf defect with normal inf wall motion-cannot r/o ischemia; prominent gut uptake adjacent to inf wall on rest images-poss defect is atten, EF 63%  . Hypertension   . Melanoma ([place])   . Parkinsonism ([place])   . Polycystic kidney disease     Family History  Problem Relation Age of Onset  . Hypertension Father   . Cancer Brother        mouth  . Cancer Sister        breast  . Heart disease Brother        heart transplant  . Cancer Daughter        breast  . Stroke Sister        >6  . Skin cancer Brother        squamous cell  . Colon cancer Neg Hx     Social History   Socioeconomic History  . Marital status: Married    Spouse name: Not on file  . Number of children: 2  . Years of education: Not on file  . Highest education level: Not on file  Occupational History  . 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 Smoking status: Never Smoker  . Smokeless tobacco: Never Used  Substance and Sexual Activity  . Alcohol use: Not Currently    Alcohol/week: 0.0 standard drinks  . Drug use: No  . Sexual activity:  Not on file  Other Topics Concern  . Not on file  Social History Narrative  . Not on file   Social Determinants of Health   Financial Resource Strain:   . Difficulty of Paying Living Expenses:   Food Insecurity:   . Worried About Charity fundraiser in the Last Year:   . Arboriculturist in the Last Year:   Transportation Needs:   . Film/video editor (Medical):   Henry Adams Lack of Transportation (Non-Medical):   Physical Activity:   . Days of Exercise per Week:   . Minutes of Exercise per Session:   Stress:   . Feeling of Stress :   Social Connections:   . Frequency of Communication with Friends and Family:   . Frequency of Social Gatherings with Friends and Family:   . Attends Religious Services:   . Active Member of Clubs or Organizations:   . Attends Archivist Meetings:   Henry Adams Marital Status:   Intimate Partner Violence:   . Fear of Current or Ex-Partner:   . Emotionally Abused:   Henry Adams Physically Abused:   . Sexually Abused:     Past Medical History, Surgical history, Social history, and Family history were reviewed and updated as appropriate.   Please see review of systems for further details on the patient's review from today.   Objective:   Physical Exam:  There were no vitals taken for this visit.  Physical Exam Constitutional:      Appearance: Normal appearance. He is normal weight.  Neurological:     Mental Status: He is alert and oriented to person, place, and time.     Motor: Weakness and tremor present.     Coordination: Coordination abnormal.     Gait: Gait abnormal.  Psychiatric:        Attention and Perception: He does not perceive auditory or visual hallucinations.  Mood and Affect: Mood is not anxious, depressed or elated. Affect is blunt. Affect is not tearful.        Speech: Speech is delayed.        Behavior: Behavior is slowed.        Thought Content: Thought content is not paranoid. Thought content does not include homicidal or  suicidal ideation.        Cognition and Memory: Memory is impaired. He exhibits impaired recent memory.     Comments: Word finding problems Not as depressed. Insight and judgment fair. Slowed responses significantly. Reduced volume and hard to understand at times. Responses intermittently make no sense.    Loses track of thoughts at time      Lab Review:     Component Value Date/Time   NA 137 03/06/2019 1500   K 4.3 03/06/2019 1500   CL 103 03/06/2019 1500   CO2 29 03/06/2019 1500   GLUCOSE 91 03/06/2019 1500   GLUCOSE 105 (H) 12/03/2005 0000   BUN 20 03/06/2019 1500   CREATININE 1.46 03/06/2019 1500   CALCIUM 8.8 03/06/2019 1500   PROT 7.2 03/06/2019 1500   ALBUMIN 3.8 03/06/2019 1500   AST 16 03/06/2019 1500   ALT 8 03/06/2019 1500   ALKPHOS 80 03/06/2019 1500   BILITOT 0.8 03/06/2019 1500   GFRNONAA 49 (L) 06/19/2018 0826   GFRAA 57 (L) 06/19/2018 0826       Component Value Date/Time   WBC 6.1 03/06/2019 1500   RBC 4.23 03/06/2019 1500   HGB 13.2 03/06/2019 1500   HCT 39.7 03/06/2019 1500   PLT 164.0 03/06/2019 1500   MCV 94.0 03/06/2019 1500   MCH 31.5 06/19/2018 0826   MCHC 33.1 03/06/2019 1500   RDW 13.6 03/06/2019 1500   LYMPHSABS 1.6 03/06/2019 1500   MONOABS 0.7 03/06/2019 1500   EOSABS 0.1 03/06/2019 1500   BASOSABS 0.0 03/06/2019 1500    No results found for: POCLITH, LITHIUM   No results found for: PHENYTOIN, PHENOBARB, VALPROATE, CBMZ   .res Assessment: Plan:    [place] was seen today for follow-up and depression.  Diagnoses and all orders for this visit:  Mild cognitive impairment -     donepezil (ARICEPT) 10 MG tablet; Take 1 tablet (10 mg total) by mouth at bedtime.   Greater than 50% of 30 min face to face time with patient was spent on counseling and coordination of care. We discussed the complex and the relationship between depression, Parkinson's disease including the cognitive complications of it.  These are all interrelated in his  case.  Cognitive screening shows reduced attention and short-term memory issues but these are not severe.  He is cognitively slow and obviously physically slow due to the Parkinson's disease.  It is difficult to tell how much depression contributes but he does not acknowledge severe depression at this time.  Less depressed than he was.  Not on antidepressant.  Consider change to more noradrenergic antidepressant like Pristiq.  They tried reducing the clonazepam but his confusion did not get better.  Therefore switching BZ probably won't help confusion which is probably Parkinson's dementia at this time.  We discussed the short-term risks associated with benzodiazepines including sedation and increased fall risk among others.  Discussed long-term side effect risk including dependence, potential withdrawal symptoms, and the potential eventual dose-related risk of dementia.  Newer studies refute this association. He did not tolerate clonazepam 0.75 mg nightly because of morning dizziness.      Disc fall  risk.  He is benefiting from the clonazepam so no change. Consider alt BZ if gets hangover or more SE.   Henry Adams asks concerns about Parkinson's Dz and hallucinations.  Not a problem currently.  Disc at length.  Disc donepezil dosing.  Suggest increase.at next refill to 10 mg daily. Sent.  Encouraged physical, social, and mental stimulation to help offset some of his symptoms as well.  Still consider Aricept but one change at a time.  The treatment plan was discussed with both the patient and his daughter.  She had several questions and we discussed those and she is agreeable with the plan also.  FU 3 mos  Lynder Parents, MD, DFAPA    Please see After Visit Summary for patient specific instructions.  Future Appointments  Date Time Provider [place]  04/10/2019  3:40 PM [facility] NURSE [facility] None  04/14/2019  3:00 PM Cirigliano, Vito V, DO [facility]  05/29/2019  2:30 PM Tat,  Eustace Quail, DO [facility] None  07/10/2019  9:30 AM Cottle, Billey Co., MD [facility] None    No orders of the defined types were placed in this encounter.   -------------------------------

## 2019-04-10 ENCOUNTER — Other Ambulatory Visit: Payer: Self-pay

## 2019-04-10 ENCOUNTER — Ambulatory Visit: Payer: Medicare Other

## 2019-04-10 ENCOUNTER — Other Ambulatory Visit: Payer: Self-pay | Admitting: Internal Medicine

## 2019-04-10 ENCOUNTER — Telehealth: Payer: Self-pay

## 2019-04-10 DIAGNOSIS — F5104 Psychophysiologic insomnia: Secondary | ICD-10-CM | POA: Insufficient documentation

## 2019-04-10 LAB — TB SKIN TEST
Induration: 0 mm
TB Skin Test: NEGATIVE

## 2019-04-10 MED ORDER — BELSOMRA 15 MG PO TABS: 1.0000 | ORAL_TABLET | Freq: Every evening | ORAL | 5 refills | Status: DC | PRN

## 2019-04-10 NOTE — Telephone Encounter (Signed)
New message    The daughter calls need a medication update on FL2 form.     The patient has an appt today in the office for a B12 shot.     Please advise.

## 2019-04-10 NOTE — Telephone Encounter (Signed)
Pt dtr and pt came in office for PPD read (negative result).   [name] stated that Dr. Clovis Pu wanted to increase the Aricept to 10 mg tablets daily and is requesting PCP to send in prescription.   Please advise if this can be done -

## 2019-04-10 NOTE — Telephone Encounter (Signed)
Denton Ar called and informed that patient is not sleeping.   Request to add rx to help pt at night so he can sleep.

## 2019-04-10 NOTE — Patient Instructions (Signed)

## 2019-04-11 NOTE — Telephone Encounter (Signed)
LVM for [name] informing of PCP response.

## 2019-04-11 NOTE — Telephone Encounter (Signed)
[name], MD  You 18 hours ago (4:34 PM)   I am not certain that this would be helpful and may cause GI side effects  I think this should be done by his neurologist if it is appropriate   Routing comment   You routed conversation to [name], MD 18 hours ago (4:25 PM)  You 18 hours ago (4:25 PM)

## 2019-04-14 ENCOUNTER — Other Ambulatory Visit: Payer: Self-pay

## 2019-04-14 ENCOUNTER — Ambulatory Visit: Payer: Medicare Other | Admitting: Gastroenterology

## 2019-04-14 ENCOUNTER — Encounter: Payer: Self-pay | Admitting: Gastroenterology

## 2019-04-14 VITALS — BP 130/84 | HR 82 | Temp 98.2°F | Ht 71.0 in | Wt 165.0 lb

## 2019-04-14 DIAGNOSIS — R1031 Right lower quadrant pain: Secondary | ICD-10-CM

## 2019-04-14 DIAGNOSIS — K5909 Other constipation: Secondary | ICD-10-CM | POA: Diagnosis not present

## 2019-04-14 DIAGNOSIS — K219 Gastro-esophageal reflux disease without esophagitis: Secondary | ICD-10-CM

## 2019-04-14 DIAGNOSIS — G20 Parkinson's disease: Secondary | ICD-10-CM | POA: Diagnosis not present

## 2019-04-14 DIAGNOSIS — R1011 Right upper quadrant pain: Secondary | ICD-10-CM

## 2019-04-14 NOTE — Patient Instructions (Signed)
You have been scheduled for a CT scan of the abdomen and pelvis at [facility] ([address]---this is in the same building as [employer]).   You are scheduled on       at        You should arrive 15 minutes prior to your appointment time for registration. Please follow the written instructions below on the day of your exam:  WARNING: IF YOU ARE ALLERGIC TO IODINE/X-RAY DYE, PLEASE NOTIFY RADIOLOGY IMMEDIATELY AT [phone]! YOU WILL BE GIVEN A 13 HOUR PREMEDICATION PREP.  1) Do not eat or drink anything after      (4 hours prior to your test) 2) You have been given 2 bottles of oral contrast to drink. The solution may taste better if refrigerated, but do NOT add ice or any other liquid to this solution. Shake well before drinking.    Drink 1 bottle of contrast @        (2 hours prior to your exam)  Drink 1 bottle of contrast @       (1 hour prior to your exam)  You may take any medications as prescribed with a small amount of water, if necessary. If you take any of the following medications: METFORMIN, GLUCOPHAGE, GLUCOVANCE, AVANDAMET, RIOMET, FORTAMET, [place] MET, JANUMET, GLUMETZA or METAGLIP, you MAY be asked to HOLD this medication 48 hours AFTER the exam.  The purpose of you drinking the oral contrast is to aid in the visualization of your intestinal tract. The contrast solution may cause some diarrhea. Depending on your individual set of symptoms, you may also receive an intravenous injection of x-ray contrast/dye. Plan on being at [facility] for 30 minutes or longer, depending on the type of exam you are having performed.  This test typically takes 30-45 minutes to complete.  If you have any questions regarding your exam or if you need to reschedule, you may call the CT department at [phone] between the hours of 8:00 am and 5:00 pm, Monday-Friday.   It was a pleasure to see you today!  Gerrit Heck,  D.O.  ________________________________________________________________________

## 2019-04-14 NOTE — Progress Notes (Signed)
P  Chief Complaint:    Constipation, RLQ tenderness  GI History: 79 year old male with a history of CIC, Parkinson's, initially seen by me on 10/10/2017 (previous patient of Dr. Deatra Ina, last seen by him in 2009) for chronic constipation, and most recently seen on 03/21/2018. See note dated 10/10/2017 for historical data. Clinically suspected longstanding history of  CIC exacerbated by either underlying Parkinson's or Parkinson's medications (4 combination of the 2), requiring escalation of therapy.  -Medications trialed: Motegrity, Linzess -Current medications: Amitiza, MiraLAX, Colace  He had endorsed exacerbation of constipation in 2019, which seems temporally related to starting Sinemet in 01/2017, with up titration to 3 times daily dosing in spring 2019 for Parkinsonism. Added carbidopa/levodopa CR nightly and 02/2017. Treated with continued Linzess 290 mcg/day, started MiraLAX 1 cap daily, high fluid/water intake and high-fiber diet.  Owing to ongoing constipation, was changed to [facility] and 02/2018.  Was seen by general surgery on 03/04/2018 for cholelithiasis (ultrasound was 20 mm GB stone without cholecystitis or choledocholithiasis).Surgeon clinically agreed that abdominal symptoms seemed more related to his underlying constipation rather than biliary in etiology,and ultimately recommended against cholecystectomy. As previously indicated that he does not want any endoscopic evaluation unless absolutely necessary.   HPI:    Patient is a 79 y.o. male presenting to the [facility] for follow-up.  Was last seen by me on 03/21/2018.  At that time had recurrence of constipation despite Motegrity, so was changed to Amitiza bid (part due to medication ineffectiveness, but also insurance issues), along with continued MiraLAX bid, and Colace bid.  Was also given Clenpiq for bowel cleanout.  Since that time had been doing relatively well from a GI/constipation standpoint.  Has  continued high fluid/water intake, high-fiber diet.  He again presents with his daughter today who assists in history.  Continues to take Amitiza, Colace bid and probiotics as prescribed, and takes MiraLAX on a prn basis (2-4 days/week per daughter).  Having regular bowel habits with very infrequent episodes of straining to have BM.  Main issue today is of RLQ tenderness noted during exam with his Nephrologist last week.  He has infrequent right-sided abdominal discomfort.  Bowel habits otherwise seem to be pretty regular as above.  No fever, chills.  Generally tolerates p.o. intake without issue, but occasionally p.o. intake exacerbates pain  Donepazil started about 6 weeks ago, and dose increased last week.   Additionally with RLQ pain.  -Abdominal x-ray 03/06/2019: Unremarkable -Labs 03/06/2019: Normal CMP, CBC  Recently transferred to a memory unit.   Review of systems:     No chest pain, no SOB, no fevers, no urinary sx   Past Medical History:  Diagnosis Date  . Allergy    perennial  . Anxiety   . Arthritis   . Cholelithiasis   . Chronic idiopathic constipation   . CKD (chronic kidney disease)   . Depression   . Diverticulosis   . Enlarged prostate   . Gallstones   . GERD (gastroesophageal reflux disease)   . HLD (hyperlipidemia)   . Hx of cardiovascular stress test    ETT-Myoview 6/14: probable low risk study, inf defect with normal inf wall motion-cannot r/o ischemia; prominent gut uptake adjacent to inf wall on rest images-poss defect is atten, EF 63%  . Hypertension   . Melanoma ([place])   . Parkinsonism ([place])   . Polycystic kidney disease     Patient's surgical history, family medical history, social history, medications and allergies were all reviewed in  Epic    Current Outpatient Medications  Medication Sig Dispense Refill  . acetaminophen (TYLENOL) 500 MG tablet 500 mg prn for back, knee, shoulder or head pain. May be given up to every 6 hours. 90 tablet 3  .  alfuzosin (UROXATRAL) 10 MG 24 hr tablet Take 1 tablet (10 mg total) by mouth 2 (two) times daily. 10 mg at 7 am and 7 pm daily.    . carbidopa-levodopa (SINEMET IR) 25-100 MG tablet TAKE 1 TABLET AT 7AM, 11AM, 3PM AND TAKE 1&1/2 TABLETS AT 7PM. 135 tablet 3  . Carbidopa-Levodopa ER (SINEMET CR) 25-100 MG tablet controlled release TAKE ONE TABLET AT BEDTIME. 90 tablet 1  . clonazePAM (KLONOPIN) 0.5 MG tablet Take 1/2-1 tablet by mouth during the day as needed for anxiety/agitation and take 1.5 tablets at bedtime as needed for sleep/anxiety (Patient taking differently: Take 1/2 tablet by mouth during the day as needed for anxiety/agitation(taken at 3PM)  and take 1.0 tablets at bedtime as needed for sleep/anxiety) 75 tablet 0  . docusate sodium (COLACE) 100 MG capsule Take 1 capsule (100 mg total) by mouth 2 (two) times daily. 1 capsule at 7am and 3pm daily. May be stopped temporarily if patient experiences loose stool. 30 capsule 11  . donepezil (ARICEPT) 10 MG tablet Take 1 tablet (10 mg total) by mouth at bedtime. 90 tablet 1  . fexofenadine (ALLEGRA) 180 MG tablet 180mg  tablet at 11 am. 90 tablet 3  . finasteride (PROSCAR) 5 MG tablet 5mg  at 7pm daily 90 tablet 3  . lubiprostone (AMITIZA) 24 MCG capsule 24 mcg capsule at 7am and 7pm daily. 180 capsule 3  . omeprazole (PRILOSEC) 40 MG capsule TAKE (1) CAPSULE DAILY. 90 capsule 0  . polyethylene glycol (MIRALAX) 17 g packet 17 g up to 3 times per day as needed. May stop if patient has loose stool. 17 each 3  . Probiotic Product ([place]) Take by mouth.    . vitamin B-12 (CYANOCOBALAMIN) 1000 MCG tablet 1000 mcg tablet at 11 am daily. 90 tablet 3   No current facility-administered medications for this visit.    Physical Exam:     BP 130/84   Pulse 82   Temp 98.2 F (36.8 C)   Ht 5\' 11"  (1.803 m)   Wt 165 lb (74.8 kg)   BMI 23.01 kg/m   GENERAL:  Pleasant male in NAD PSYCH: : Cooperative, normal affect EENT:   conjunctiva pink, mucous membranes moist, neck supple without masses CARDIAC:  RRR, no murmur heard, no peripheral edema PULM: Normal respiratory effort, lungs CTA bilaterally, no wheezing ABDOMEN: Mild TTP in RLQ without rebound or guarding.  No peritoneal signs.  Abdomen slightly distended, but no ascites.  No rebound or guarding.  Otherwise soft, normal bowel sounds SKIN:  turgor, no lesions seen Musculoskeletal:  Normal muscle tone, normal strength   IMPRESSION and PLAN:    1)Constipation 2) RLQ pain History of Chronic Idiopathic Constipation with exacerbation likely multifactorial2/2underlying Parkinsonism and Parkinson medications.  Constipation otherwise seems pretty well controlled with current regimen, but more recently with right-sided abdominal discomfort and RLQ TTP on exam with Nephrologist next week.  Weight stable, tolerating p.o. intake.  Plan for the following:  -Continue current med regimen: Amitiza, Colace, probiotic, with as needed MiraLAX -Continue current adequate hydration and high-fiber diet -CT abdomen/pelvis with p.o. contrast (avoiding IV contrast due to renal disease) -Additional recommendations pending radiographic findings -Per prior conversation, patient only wants endoscopic evaluation if absolutely  necessary  3) GERD: Well-controlled with Prilosec. No change in medications at this time.  4)Cholelithiasis: Recent ultrasound 2 cm stone with otherwise normal HIDA.Has been seen by [facility] and no plan for elective cholecystectomy.  5) Parkinson's: -Follows closely with neurology     I spent 30 minutes of time, including in depth chart review, independent review of results as outlined above, communicating results with the patient and his daughter directly, face-to-face time with the patient and his daughter, coordinating care, and ordering studies and medications as appropriate, and documentation.   Lavena Bullion ,DO, FACG 04/14/2019, 3:18 PM

## 2019-04-16 ENCOUNTER — Telehealth: Payer: Self-pay | Admitting: Internal Medicine

## 2019-04-16 NOTE — Telephone Encounter (Signed)
    [facility] calling to request medication orders. (New to center) Call [phone]

## 2019-04-17 NOTE — Telephone Encounter (Signed)
Faxed several times. Most recently this morning.

## 2019-04-20 ENCOUNTER — Telehealth: Payer: Self-pay

## 2019-04-20 ENCOUNTER — Ambulatory Visit (INDEPENDENT_AMBULATORY_CARE_PROVIDER_SITE_OTHER)
Admission: RE | Admit: 2019-04-20 | Discharge: 2019-04-20 | Disposition: A | Discharge status: Home / Self Care | Payer: Medicare Other | Source: Ambulatory Visit | Attending: Gastroenterology | Admitting: Gastroenterology

## 2019-04-20 ENCOUNTER — Other Ambulatory Visit: Payer: Self-pay

## 2019-04-20 DIAGNOSIS — R1011 Right upper quadrant pain: Secondary | ICD-10-CM | POA: Diagnosis not present

## 2019-04-20 DIAGNOSIS — K5909 Other constipation: Secondary | ICD-10-CM | POA: Diagnosis not present

## 2019-04-20 DIAGNOSIS — R1031 Right lower quadrant pain: Secondary | ICD-10-CM

## 2019-04-20 NOTE — Telephone Encounter (Signed)
New message    [name] calling needs to speak with the CMA on the FL2 update in regards to two medications.   polyethylene glycol (MIRALAX) 17 g packet and clonazePAM (KLONOPIN) 0.5 MG tablet

## 2019-04-20 NOTE — Telephone Encounter (Signed)
Update medications.   Clonazepam - 0.5 tab at 3pm and 1 tab qhs.   miralax - 17 g qhs and then another order written 17 g prn up to three times daily.

## 2019-04-21 NOTE — Telephone Encounter (Signed)
Form completed again and faxed back as requested.

## 2019-04-22 DIAGNOSIS — Z0279 Encounter for issue of other medical certificate: Secondary | ICD-10-CM

## 2019-04-30 ENCOUNTER — Telehealth: Payer: Self-pay | Admitting: Internal Medicine

## 2019-04-30 NOTE — Telephone Encounter (Signed)
New message:   Pt's daughter is calling and would like to speak to someone about the Mirilax that he is currently taking over the counter. Please advise.

## 2019-05-01 NOTE — Telephone Encounter (Signed)
Called [name] back and she was concerned about pt and had diarrhea all day Tuesday (once in the last several months) and [name] has asked if [facility] could hold the Miralax on Tuesday night and then  again on Thursday night. [name] felt that maybe the miralax could be given every other night.   [name] was wondering if an every other night order could be the way to prevent the diarrhea and still prevent the constipation. [name] stated that she will try the every other day through the weekend and will let me know on Monday if that works.

## 2019-05-04 ENCOUNTER — Encounter: Payer: Self-pay | Admitting: Internal Medicine

## 2019-05-06 ENCOUNTER — Telehealth: Payer: Self-pay | Admitting: Internal Medicine

## 2019-05-06 ENCOUNTER — Encounter: Payer: Self-pay | Admitting: Internal Medicine

## 2019-05-06 NOTE — Telephone Encounter (Signed)
New message:   Henry Adams is calling and states she needs the patients care plan and list of medications sign and return per the fax that was sent. Please advise.

## 2019-05-06 NOTE — Telephone Encounter (Signed)
Form has been faxed.

## 2019-05-15 ENCOUNTER — Encounter: Payer: Self-pay | Admitting: Internal Medicine

## 2019-05-21 ENCOUNTER — Encounter: Payer: Self-pay | Admitting: Internal Medicine

## 2019-05-21 ENCOUNTER — Ambulatory Visit (INDEPENDENT_AMBULATORY_CARE_PROVIDER_SITE_OTHER): Payer: Medicare Other | Admitting: Internal Medicine

## 2019-05-21 ENCOUNTER — Ambulatory Visit (INDEPENDENT_AMBULATORY_CARE_PROVIDER_SITE_OTHER): Payer: Medicare Other

## 2019-05-21 ENCOUNTER — Other Ambulatory Visit: Payer: Self-pay

## 2019-05-21 VITALS — BP 134/86 | HR 75 | Temp 98.2°F | Resp 16 | Ht 71.0 in | Wt 166.2 lb

## 2019-05-21 DIAGNOSIS — R10816 Epigastric abdominal tenderness: Secondary | ICD-10-CM | POA: Insufficient documentation

## 2019-05-21 DIAGNOSIS — R0782 Intercostal pain: Secondary | ICD-10-CM | POA: Insufficient documentation

## 2019-05-21 DIAGNOSIS — R748 Abnormal levels of other serum enzymes: Secondary | ICD-10-CM

## 2019-05-21 DIAGNOSIS — R079 Chest pain, unspecified: Secondary | ICD-10-CM | POA: Diagnosis not present

## 2019-05-21 DIAGNOSIS — R7989 Other specified abnormal findings of blood chemistry: Secondary | ICD-10-CM

## 2019-05-21 LAB — CBC WITH DIFFERENTIAL/PLATELET
Basophils Absolute: 0 10*3/uL (ref 0.0–0.1)
Basophils Relative: 0.5 % (ref 0.0–3.0)
Eosinophils Absolute: 0.1 10*3/uL (ref 0.0–0.7)
Eosinophils Relative: 2.2 % (ref 0.0–5.0)
HCT: 37.7 % — ABNORMAL LOW (ref 39.0–52.0)
Hemoglobin: 12.6 g/dL — ABNORMAL LOW (ref 13.0–17.0)
Lymphocytes Relative: 20.9 % (ref 12.0–46.0)
Lymphs Abs: 1.3 10*3/uL (ref 0.7–4.0)
MCHC: 33.4 g/dL (ref 30.0–36.0)
MCV: 94.7 fl (ref 78.0–100.0)
Monocytes Absolute: 0.7 10*3/uL (ref 0.1–1.0)
Monocytes Relative: 11.2 % (ref 3.0–12.0)
Neutro Abs: 4 10*3/uL (ref 1.4–7.7)
Neutrophils Relative %: 65.2 % (ref 43.0–77.0)
Platelets: 152 10*3/uL (ref 150.0–400.0)
RBC: 3.98 Mil/uL — ABNORMAL LOW (ref 4.22–5.81)
RDW: 14.1 % (ref 11.5–15.5)
WBC: 6.2 10*3/uL (ref 4.0–10.5)

## 2019-05-21 LAB — BASIC METABOLIC PANEL
BUN: 29 mg/dL — ABNORMAL HIGH (ref 6–23)
CO2: 30 mEq/L (ref 19–32)
Calcium: 8.7 mg/dL (ref 8.4–10.5)
Chloride: 104 mEq/L (ref 96–112)
Creatinine, Ser: 1.53 mg/dL — ABNORMAL HIGH (ref 0.40–1.50)
GFR: 43.88 mL/min — ABNORMAL LOW (ref >60.00)
Glucose, Bld: 113 mg/dL — ABNORMAL HIGH (ref 70–99)
Potassium: 4.5 mEq/L (ref 3.5–5.1)
Sodium: 139 mEq/L (ref 135–145)

## 2019-05-21 LAB — LIPASE: Lipase: 69 U/L — ABNORMAL HIGH (ref 11.0–59.0)

## 2019-05-21 LAB — TROPONIN I (HIGH SENSITIVITY): High Sens Troponin I: 7 ng/L (ref 2–17)

## 2019-05-21 LAB — HEPATIC FUNCTION PANEL
ALT: 8 U/L (ref 0–53)
AST: 16 U/L (ref 0–37)
Albumin: 3.9 g/dL (ref 3.5–5.2)
Alkaline Phosphatase: 72 U/L (ref 39–117)
Bilirubin, Direct: 0.2 mg/dL (ref 0.0–0.3)
Total Bilirubin: 0.9 mg/dL (ref 0.2–1.2)
Total Protein: 7 g/dL (ref 6.0–8.3)

## 2019-05-21 LAB — D-DIMER, QUANTITATIVE: D-Dimer, Quant: 3.15 mcg/mL FEU — ABNORMAL HIGH (ref <0.50)

## 2019-05-21 NOTE — Patient Instructions (Signed)

## 2019-05-21 NOTE — Progress Notes (Signed)
Subjective:  Patient ID: Henry Adams, male    DOB: 11/11/1940  Age: 79 y.o. MRN: [id number]  CC: Chest Pain (Under neath breast plate) and Abdominal Pain  This visit occurred during the SARS-CoV-2 public health emergency.  Safety protocols were in place, including screening questions prior to the visit, additional usage of staff PPE, and extensive cleaning of exam room while observing appropriate contact time as indicated for disinfecting solutions.   HPI Henry Adams presents for CP and abd pain.  His daughter brings him in today and tells me that he has had an 8-hour history of pain over his left sternum, left lower rib cage, and epigastric region.  He describes it as a constant, intense, throbbing sensation.  He has not gotten much relief from Tylenol.  His blood pressure has been elevated throughout the day.  He denies cough, diaphoresis, dizziness, lightheadedness, palpitations, or edema.  Outpatient Medications Prior to Visit  Medication Sig Dispense Refill  . acetaminophen (TYLENOL) 500 MG tablet 500 mg prn for back, knee, shoulder or head pain. May be given up to every 6 hours. 90 tablet 3  . alfuzosin (UROXATRAL) 10 MG 24 hr tablet Take 1 tablet (10 mg total) by mouth 2 (two) times daily. 10 mg at 7 am and 7 pm daily.    . BELSOMRA 15 MG TABS Take 1 tablet by mouth at bedtime as needed.    . carbidopa-levodopa (SINEMET IR) 25-100 MG tablet TAKE 1 TABLET AT 7AM, 11AM, 3PM AND TAKE 1&1/2 TABLETS AT 7PM. 135 tablet 3  . Carbidopa-Levodopa ER (SINEMET CR) 25-100 MG tablet controlled release TAKE ONE TABLET AT BEDTIME. 90 tablet 1  . clonazePAM (KLONOPIN) 0.5 MG tablet Take 1/2-1 tablet by mouth during the day as needed for anxiety/agitation and take 1.5 tablets at bedtime as needed for sleep/anxiety (Patient taking differently: Take 1/2 tablet by mouth during the day as needed for anxiety/agitation(taken at 3PM)  and take 1.0 tablets at bedtime as needed for sleep/anxiety) 75 tablet 0  .  docusate sodium (COLACE) 100 MG capsule Take 1 capsule (100 mg total) by mouth 2 (two) times daily. 1 capsule at 7am and 3pm daily. May be stopped temporarily if patient experiences loose stool. 30 capsule 11  . donepezil (ARICEPT) 10 MG tablet Take 1 tablet (10 mg total) by mouth at bedtime. 90 tablet 1  . fexofenadine (ALLEGRA) 180 MG tablet 180mg  tablet at 11 am. 90 tablet 3  . finasteride (PROSCAR) 5 MG tablet 5mg  at 7pm daily 90 tablet 3  . lubiprostone (AMITIZA) 24 MCG capsule 24 mcg capsule at 7am and 7pm daily. 180 capsule 3  . omeprazole (PRILOSEC) 40 MG capsule TAKE (1) CAPSULE DAILY. 90 capsule 0  . polyethylene glycol (MIRALAX) 17 g packet 17 g up to 3 times per day as needed. May stop if patient has loose stool. 17 each 3  . Probiotic Product ([place]) Take by mouth.    . TOBRADEX ST 0.3-0.05 % SUSP Place 1 drop into the left eye 4 (four) times daily.    [name] tobramycin-dexamethasone (TOBRADEX) ophthalmic solution     . vitamin B-12 (CYANOCOBALAMIN) 1000 MCG tablet 1000 mcg tablet at 11 am daily. 90 tablet 3   No facility-administered medications prior to visit.    ROS Review of Systems  Constitutional: Negative.  Negative for chills, diaphoresis, fatigue and fever.  HENT: Negative.  Negative for sore throat and trouble swallowing.   Eyes: Negative.   Respiratory:  Negative for cough, chest tightness, shortness of breath and wheezing.   Cardiovascular: Positive for chest pain. Negative for palpitations and leg swelling.  Gastrointestinal: Positive for abdominal pain and constipation. Negative for abdominal distention, diarrhea, nausea and vomiting.  Endocrine: Negative.   Genitourinary: Negative.  Negative for difficulty urinating, frequency and hematuria.  Musculoskeletal: Negative.  Negative for arthralgias, back pain and neck pain.  Skin: Negative.  Negative for rash.  Neurological: Negative.  Negative for dizziness, weakness, light-headedness and headaches.    Hematological: Negative for adenopathy. Does not bruise/bleed easily.  Psychiatric/Behavioral: Negative.     Objective:  BP 134/86 (BP Location: Left Arm, Patient Position: Sitting, Cuff Size: Normal)   Pulse 75   Temp 98.2 F (36.8 C) (Oral)   Resp 16   Ht 5\' 11"  (1.803 m)   Wt 166 lb 3.2 oz (75.4 kg)   SpO2 97%   BMI 23.18 kg/m   BP Readings from Last 3 Encounters:  05/21/19 134/86  04/14/19 130/84  04/08/19 120/80    Wt Readings from Last 3 Encounters:  05/21/19 166 lb 3.2 oz (75.4 kg)  04/14/19 165 lb (74.8 kg)  04/08/19 160 lb (72.6 kg)    Physical Exam Vitals reviewed.  Constitutional:      General: He is not in acute distress.    Appearance: He is not toxic-appearing or diaphoretic.  HENT:     Nose: Nose normal.     Mouth/Throat:     Mouth: Mucous membranes are moist.  Eyes:     General: No scleral icterus.    Conjunctiva/sclera: Conjunctivae normal.  Cardiovascular:     Rate and Rhythm: Normal rate and regular rhythm.     Heart sounds: No murmur. No friction rub.     Comments: EKG - Sinus rhythm with sinus arrhythmia LAFB No Q waves, no ST/T wave changes No change from the prior EKG Pulmonary:     Effort: Pulmonary effort is normal.     Breath sounds: No stridor. No wheezing, rhonchi or rales.  Abdominal:     General: Abdomen is protuberant. Bowel sounds are normal. There is distension.     Palpations: There is no hepatomegaly, splenomegaly or mass.     Tenderness: There is abdominal tenderness in the epigastric area. There is no guarding or rebound. Negative signs include Murphy's sign.     Hernia: No hernia is present.  Musculoskeletal:        General: Normal range of motion.     Cervical back: Neck supple.     Right lower leg: No edema.     Left lower leg: No edema.  Lymphadenopathy:     Cervical: No cervical adenopathy.  Skin:    General: Skin is warm.     Coloration: Skin is not jaundiced.  Neurological:     General: No focal deficit  present.     Mental Status: He is alert. Mental status is at baseline.  Psychiatric:        Mood and Affect: Mood normal.     Lab Results  Component Value Date   WBC 6.2 05/21/2019   HGB 12.6 (L) 05/21/2019   HCT 37.7 (L) 05/21/2019   PLT 152.0 05/21/2019   GLUCOSE 113 (H) 05/21/2019   CHOL 179 11/14/2016   TRIG 73.0 11/14/2016   HDL 68.50 11/14/2016   LDLDIRECT 131.7 07/02/2011   LDLCALC 96 11/14/2016   ALT 8 05/21/2019   AST 16 05/21/2019   NA 139 05/21/2019   K 4.5 05/21/2019  CL 104 05/21/2019   CREATININE 1.53 (H) 05/21/2019   BUN 29 (H) 05/21/2019   CO2 30 05/21/2019   TSH 1.36 06/25/2018   PSA 2.28 08/11/2015   INR 1.1 (H) 08/01/2012   HGBA1C 5.8 12/29/2018    CT ABDOMEN PELVIS WO CONTRAST  Result Date: 04/21/2019 CLINICAL DATA:  Right abdominal pain, chronic constipation EXAM: CT ABDOMEN AND PELVIS WITHOUT CONTRAST TECHNIQUE: Multidetector CT imaging of the abdomen and pelvis was performed following the standard protocol without IV contrast. COMPARISON:  06/16/2018 FINDINGS: Lower chest: Linear scarring/atelectasis in the right lower lobe. Hepatobiliary: Unenhanced liver is notable for scattered hepatic cysts measuring up to 3.2 cm in segment 2 (series 3/image 23), previously 3.1 cm. Gallbladder is unremarkable. No intrahepatic or extrahepatic ductal dilatation. Pancreas: Within normal limits. Spleen: Within normal limits. Adrenals/Urinary Tract: Adrenal glands are poorly visualized. Numerous bilateral renal cysts of varying sizes and complexity, compatible with patient's known autosomal dominant polycystic kidney disease. Stable hyperdensity/hemorrhage posteriorly in the left kidney (series 3/image 47). Scattered small parenchymal/septal calcifications versus nonobstructing renal calculi on the right, unchanged. Representative renal sizes are 33.1 cm on the left (sagittal image 81) and 36.4 cm on the right (sagittal image 38). No hydronephrosis. Bladder is underdistended  but unremarkable. Stomach/Bowel: Stomach is within normal limits. No evidence of bowel obstruction. Suspected normal appendix (series 3/image 31), although poorly visualized. Mild to moderate colonic stool burden, possibly reflecting mild constipation. Vascular/Lymphatic: No evidence of abdominal aortic aneurysm. Atherosclerotic calcifications of the abdominal aorta and branch vessels. No suspicious abdominopelvic lymphadenopathy. Reproductive: Prostate is grossly unremarkable, noting dystrophic calcifications. Other: No abdominopelvic ascites. Musculoskeletal: Mild superior endplate changes at L3. IMPRESSION: Large bilateral kidneys with numerous cysts of varying sizes and complexities, compatible with autosomal dominant polycystic kidney disease, grossly unchanged. Mild to moderate colonic stool burden, possibly reflecting mild constipation. No evidence of bowel obstruction. Suspected appendix is within normal limits. Electronically Signed   By: Julian Hy M.D.   On: 04/21/2019 02:25   DG ABD ACUTE 2+V W 1V CHEST  Result Date: 05/21/2019 CLINICAL DATA:  Epigastric region pain EXAM: DG ABDOMEN ACUTE W/ 1V CHEST COMPARISON:  CT abdomen and pelvis April 20, 2019; chest radiograph Jun 16, 2018 FINDINGS: PA chest: Lungs are clear. Heart size and pulmonary vascularity are normal. No adenopathy. Supine and upright abdomen: There is diffuse stool throughout much of the colon. There is no bowel dilatation or air-fluid level to suggest bowel obstruction. No free air. No abnormal calcifications. IMPRESSION: Diffuse stool throughout colon. Suspect a degree of constipation. No bowel obstruction or free air. Lungs clear. Electronically Signed   By: Lowella Grip III M.D.   On: 05/21/2019 14:33    Assessment & Plan:   Akwasi was seen today for chest pain and abdominal pain.  Diagnoses and all orders for this visit:  Epigastric abdominal tenderness without rebound tenderness-he has acute onset epigastric  pain.  Plain films are reassuring.  His examination is not consistent with an acute abdominal process.  Labs revealed a mildly elevated lipase but are otherwise unremarkable.  I recommended that he undergo a CT scan of the abdomen with contrast to see if there is concern for a rupture of an abdominal viscus, peptic ulcer disease, pancreatitis, or ischemic bowel. -     DG ABD ACUTE 2+V W 1V CHEST; Future -     CBC with Differential/Platelet; Future -     Basic metabolic panel; Future -     Lipase; Future -  Hepatic function panel; Future -     Hepatic function panel -     Lipase -     Basic metabolic panel -     CBC with Differential/Platelet -     CT Abdomen Pelvis W Contrast; Future  Intercostal pain- See below -     DG ABD ACUTE 2+V W 1V CHEST; Future -     Troponin I (High Sensitivity); Future -     D-dimer, quantitative (not at [facility]); Future -     D-dimer, quantitative (not at [facility]) -     Troponin I (High Sensitivity) -     CT Angio Chest W/Cm &/Or Wo Cm; Future  Chest pain, unspecified type- He has new onset, atypical chest pain.  His EKG does not suggest ischemia and his troponin is normal.  He has an elevated D-dimer so I have asked him to undergo CT angio to see if there is a pulmonary embolus. -     EKG 12-Lead -     CT Angio Chest W/Cm &/Or Wo Cm; Future  Elevated lipase -     CT Abdomen Pelvis W Contrast; Future  D-dimer, elevated -     CT Angio Chest W/Cm &/Or Wo Cm; Future   I am having Henry Adams maintain his lubiprostone, fexofenadine, finasteride, docusate sodium, acetaminophen, vitamin B-12, polyethylene glycol, clonazePAM, omeprazole, carbidopa-levodopa, Carbidopa-Levodopa ER, alfuzosin, Probiotic Product ([place]), donepezil, Belsomra, tobramycin-dexamethasone, and TobraDex ST.  No orders of the defined types were placed in this encounter.  I spent 50 minutes in preparing to see the patient by review of recent labs, imaging and procedures,  obtaining and reviewing separately obtained history, communicating with the patient and family or caregiver, ordering medications, tests or procedures, and documenting clinical information in the EHR including the differential Dx, treatment, and any further evaluation and other management of 1. Epigastric abdominal tenderness without rebound tenderness 2. Intercostal pain 3. Chest pain, unspecified type 4. Elevated lipase 5. D-dimer, elevated    Follow-up: Return in about 1 week (around 05/28/2019).  Scarlette Calico, MD

## 2019-05-22 ENCOUNTER — Other Ambulatory Visit: Payer: Self-pay

## 2019-05-22 ENCOUNTER — Emergency Department (HOSPITAL_COMMUNITY): Payer: Medicare Other

## 2019-05-22 ENCOUNTER — Telehealth: Payer: Self-pay

## 2019-05-22 ENCOUNTER — Encounter: Payer: Self-pay | Admitting: Internal Medicine

## 2019-05-22 ENCOUNTER — Encounter (HOSPITAL_COMMUNITY): Payer: Self-pay

## 2019-05-22 ENCOUNTER — Emergency Department (HOSPITAL_COMMUNITY)
Admission: EM | Admit: 2019-05-22 | Discharge: 2019-05-22 | Disposition: A | Discharge status: Home / Self Care | Payer: Medicare Other | Attending: Emergency Medicine | Admitting: Emergency Medicine

## 2019-05-22 DIAGNOSIS — R1013 Epigastric pain: Secondary | ICD-10-CM | POA: Diagnosis not present

## 2019-05-22 DIAGNOSIS — I129 Hypertensive chronic kidney disease with stage 1 through stage 4 chronic kidney disease, or unspecified chronic kidney disease: Secondary | ICD-10-CM | POA: Insufficient documentation

## 2019-05-22 DIAGNOSIS — R0789 Other chest pain: Secondary | ICD-10-CM

## 2019-05-22 DIAGNOSIS — N183 Chronic kidney disease, stage 3 unspecified: Secondary | ICD-10-CM | POA: Diagnosis not present

## 2019-05-22 DIAGNOSIS — Z79899 Other long term (current) drug therapy: Secondary | ICD-10-CM | POA: Insufficient documentation

## 2019-05-22 LAB — BASIC METABOLIC PANEL
Anion gap: 8 (ref 5–15)
BUN: 31 mg/dL — ABNORMAL HIGH (ref 8–23)
CO2: 25 mmol/L (ref 22–32)
Calcium: 8.4 mg/dL — ABNORMAL LOW (ref 8.9–10.3)
Chloride: 106 mmol/L (ref 98–111)
Creatinine, Ser: 1.27 mg/dL — ABNORMAL HIGH (ref 0.61–1.24)
GFR calc Af Amer: >60 mL/min (ref >60)
GFR calc non Af Amer: 53 mL/min — ABNORMAL LOW (ref >60)
Glucose, Bld: 136 mg/dL — ABNORMAL HIGH (ref 70–99)
Potassium: 3.8 mmol/L (ref 3.5–5.1)
Sodium: 139 mmol/L (ref 135–145)

## 2019-05-22 LAB — LIPASE, BLOOD: Lipase: 42 U/L (ref 11–51)

## 2019-05-22 LAB — TROPONIN I (HIGH SENSITIVITY): Troponin I (High Sensitivity): 5 ng/L (ref <18)

## 2019-05-22 MED ORDER — DOCUSATE SODIUM 100 MG PO CAPS
100.0000 mg | ORAL_CAPSULE | Freq: Once | ORAL | Status: AC
  Administered 2019-05-22: 100 mg via ORAL
  Filled 2019-05-22: qty 1

## 2019-05-22 MED ORDER — SODIUM CHLORIDE (PF) 0.9 % IJ SOLN
INTRAMUSCULAR | Status: AC
  Filled 2019-05-22: qty 50

## 2019-05-22 MED ORDER — SODIUM CHLORIDE 0.9 % IV BOLUS
500.0000 mL | Freq: Once | INTRAVENOUS | Status: AC
  Administered 2019-05-22: 14:00:00 500 mL via INTRAVENOUS

## 2019-05-22 MED ORDER — TECHNETIUM TC 99M DIETHYLENETRIAME-PENTAACETIC ACID: 40.9000 | Freq: Once | INTRAVENOUS | Status: DC | PRN

## 2019-05-22 MED ORDER — IOHEXOL 350 MG/ML SOLN: 100.0000 mL | Freq: Once | INTRAVENOUS | Status: DC | PRN

## 2019-05-22 MED ORDER — TECHNETIUM TO 99M ALBUMIN AGGREGATED
1.6000 | Freq: Once | INTRAVENOUS | Status: AC
  Administered 2019-05-22: 21:00:00 1.6 via INTRAVENOUS

## 2019-05-22 MED ORDER — CARBIDOPA-LEVODOPA 25-100 MG PO TABS
1.0000 | ORAL_TABLET | Freq: Once | ORAL | Status: AC
  Administered 2019-05-22: 1 via ORAL
  Filled 2019-05-22: qty 1

## 2019-05-22 MED ORDER — CLONAZEPAM 0.5 MG PO TABS
0.5000 mg | ORAL_TABLET | Freq: Once | ORAL | Status: AC
  Administered 2019-05-22: 17:00:00 0.5 mg via ORAL
  Filled 2019-05-22: qty 1

## 2019-05-22 NOTE — Telephone Encounter (Signed)
Per request of Cora Daniels, this RN called to speak with pt proxy Olin Hauser Ranson re: Dr Ronnald Ramp request that she take pt to ED & Ms Ranson having questions.  Ms Ranson advised per Dr Ronnald Ramp that pt's situation is complicated that it is safer to take him to ED to perform necessary scans.  Ms Ranson states she will take pt to ED today for scans.

## 2019-05-22 NOTE — ED Provider Notes (Signed)
[facility] DEPT Provider Note   CSN: [id number] Arrival date & time: 05/22/19  1301     History Chief Complaint  Patient presents with  . Abnormal Lab  . Abdominal Pain    KAMARI KOPACK is a 79 y.o. male.  HPI Patient presents with daughter.  Has had about a day history of epigastric and left-sided chest pain.  Seen by PCP yesterday.  Had lab work and x-ray done.  X-ray showed some constipation but did have mildly elevated lipase at 69 and elevated D-dimer at 3.  Unable to get outpatient CT scan sent here for further treatment.  States pain hurts more when he moves.  Is in left chest mid chest and upper abdomen.  Has known gallstones.  LFTs normal yesterday.  Normal troponin yesterday.  EKG reassuring yesterday.    Past Medical History:  Diagnosis Date  . Allergy    perennial  . Anxiety   . Arthritis   . Cholelithiasis   . Chronic idiopathic constipation   . CKD (chronic kidney disease)   . Depression   . Diverticulosis   . Enlarged prostate   . Gallstones   . GERD (gastroesophageal reflux disease)   . HLD (hyperlipidemia)   . Hx of cardiovascular stress test    ETT-Myoview 6/14: probable low risk study, inf defect with normal inf wall motion-cannot r/o ischemia; prominent gut uptake adjacent to inf wall on rest images-poss defect is atten, EF 63%  . Hypertension   . Melanoma ([facility])   . Parkinsonism ([place])   . Polycystic kidney disease     Patient Active Problem List   Diagnosis Date Noted  . Intercostal pain 05/21/2019  . Epigastric abdominal tenderness without rebound tenderness 05/21/2019  . Elevated lipase 05/21/2019  . D-dimer, elevated 05/21/2019  . Psychophysiological insomnia 04/10/2019  . Ketonuria 12/29/2018  . Dark yellow-colored urine 12/29/2018  . Benign prostatic hyperplasia with urinary hesitancy 06/25/2018  . Oral candida 06/23/2018  . Parkinsonism ([place])   . UTI (urinary tract infection) 06/16/2018  . CRI (chronic  renal insufficiency), stage 3 (moderate) 03/27/2018  . Restless leg syndrome 01/23/2018  . Calculus of gallbladder with chronic cholecystitis without obstruction 09/18/2017  . Chronic idiopathic constipation 08/19/2017  . Insomnia 10/24/2016  . BPH associated with nocturia 08/11/2015  . Vocal cord atrophy 09/01/2013  . Chest pain 12/28/2009  . Essential hypertension 04/26/2007  . Allergic rhinitis 04/26/2007  . DEGENERATIVE JOINT DISEASE 04/26/2007  . G E R D 08/05/2006  . Helicobacter pylori infection 05/30/2006  . Hyperlipidemia with target LDL less than 160 05/30/2006  . Polycystic kidney 05/30/2006    Past Surgical History:  Procedure Laterality Date  . COLONOSCOPY  2011   diverticulosis  . HERNIA REPAIR     umbilical  . KNEE SURGERY Left   . medialization laryngoplasty  10/15/2013   Dr Joya Gaskins, [facility]  . TONSILLECTOMY AND ADENOIDECTOMY         Family History  Problem Relation Age of Onset  . Hypertension Father   . Cancer Brother        mouth  . Cancer Sister        breast  . Heart disease Brother        heart transplant  . Cancer Daughter        breast  . Stroke Sister        >52  . Skin cancer Brother        squamous cell  . Colon cancer  Neg Hx     Social History   Tobacco Use  . Smoking status: Never Smoker  . Smokeless tobacco: Never Used  Substance Use Topics  . Alcohol use: Not Currently    Alcohol/week: 0.0 standard drinks  . Drug use: No    Home Medications Prior to Admission medications   Medication Sig Start Date End Date Taking? Authorizing Provider  acetaminophen (TYLENOL) 500 MG tablet 500 mg prn for back, knee, shoulder or head pain. May be given up to every 6 hours. 02/05/19  Yes [name], MD  alfuzosin (UROXATRAL) 10 MG 24 hr tablet Take 1 tablet (10 mg total) by mouth 2 (two) times daily. 10 mg at 7 am and 7 pm daily. 04/08/19  Yes [name], MD  carbidopa-levodopa (SINEMET IR) 25-100 MG tablet TAKE 1 TABLET AT 7AM, 11AM,  3PM AND TAKE 1&1/2 TABLETS AT 7PM. 03/30/19  Yes Tat, Eustace Quail, DO  Carbidopa-Levodopa ER (SINEMET CR) 25-100 MG tablet controlled release TAKE ONE TABLET AT BEDTIME. Patient taking differently: Take 1 tablet by mouth at bedtime.  04/06/19  Yes Tat, Eustace Quail, DO  clonazePAM (KLONOPIN) 0.5 MG tablet Take 1/2-1 tablet by mouth during the day as needed for anxiety/agitation and take 1.5 tablets at bedtime as needed for sleep/anxiety Patient taking differently: Take 0.25-0.5 mg by mouth See admin instructions. Take 0.25mg  at 3pm and 0.5mg  at 9pm 03/06/19  Yes Cottle, Billey Co., MD  docusate sodium (COLACE) 100 MG capsule Take 1 capsule (100 mg total) by mouth 2 (two) times daily. 1 capsule at 7am and 3pm daily. May be stopped temporarily if patient experiences loose stool. 02/05/19  Yes [name], MD  donepezil (ARICEPT) 10 MG tablet Take 1 tablet (10 mg total) by mouth at bedtime. 04/09/19  Yes Cottle, Billey Co., MD  fexofenadine (ALLEGRA) 180 MG tablet 180mg  tablet at 11 am. Patient taking differently: Take 180 mg by mouth daily. 11 am. 02/05/19  Yes [name], MD  finasteride (PROSCAR) 5 MG tablet 5mg  at 7pm daily Patient taking differently: Take 5 mg by mouth every evening. 7pm 02/05/19  Yes [name], MD  lubiprostone (AMITIZA) 24 MCG capsule 24 mcg capsule at 7am and 7pm daily. Patient taking differently: Take 24 mcg by mouth 2 (two) times daily with a meal.  02/05/19  Yes [name], MD  omeprazole (PRILOSEC) 40 MG capsule TAKE (1) CAPSULE DAILY. Patient taking differently: Take 40 mg by mouth daily.  03/11/19  Yes [name], MD  polyethylene glycol (MIRALAX) 17 g packet 17 g up to 3 times per day as needed. May stop if patient has loose stool. Patient taking differently: Take 17 g by mouth 2 (two) times daily as needed for mild constipation.  02/05/19  Yes [name], MD  Probiotic Product ([place]) Take by mouth.   Yes [provider]    vitamin B-12 (CYANOCOBALAMIN) 1000 MCG tablet 1000 mcg tablet at 11 am daily. Patient taking differently: Take 1,000 mcg by mouth daily.  02/05/19  Yes [name], MD    Allergies    Iodine, Oxycodone, Pollen extract, Tamiflu [oseltamivir phosphate], Tamsulosin, Doxazosin, Etodolac, and Tape  Review of Systems   Review of Systems  Constitutional: Negative for appetite change.  HENT: Negative for congestion.   Respiratory: Positive for shortness of breath.   Cardiovascular: Positive for chest pain.  Gastrointestinal: Positive for abdominal distention and abdominal pain. Negative for nausea  and vomiting.  Genitourinary: Negative for flank pain.  Musculoskeletal: Negative for back pain.  Skin: Negative for rash.  Neurological: Negative for weakness.  Psychiatric/Behavioral: Negative for confusion.    Physical Exam Updated Vital Signs BP (!) 146/92   Pulse 66   Temp 98.2 F (36.8 C) (Oral)   Resp 18   Ht 5\' 11"  (1.803 m)   Wt 75.4 kg   SpO2 94%   BMI 23.18 kg/m   Physical Exam Vitals and nursing note reviewed.  HENT:     Head: Normocephalic.  Cardiovascular:     Rate and Rhythm: Regular rhythm.  Pulmonary:     Comments: Anterior chest tenderness no rebound or guarding Chest:     Chest wall: Tenderness present.  Abdominal:     Palpations: Abdomen is soft.     Tenderness: There is abdominal tenderness.     Hernia: No hernia is present.     Comments: Epigastric tenderness without rebound or guarding.  Somewhat distended abdomen  Skin:    General: Skin is warm.     Capillary Refill: Capillary refill takes less than 2 seconds.  Neurological:     Mental Status: He is alert and oriented to person, place, and time.     ED Results / Procedures / Treatments   Labs (all labs ordered are listed, but only abnormal results are displayed) Labs Reviewed  BASIC METABOLIC PANEL  LIPASE, BLOOD    EKG None  Radiology DG ABD ACUTE 2+V W 1V CHEST  Result Date:  05/21/2019 CLINICAL DATA:  Epigastric region pain EXAM: DG ABDOMEN ACUTE W/ 1V CHEST COMPARISON:  CT abdomen and pelvis April 20, 2019; chest radiograph Jun 16, 2018 FINDINGS: PA chest: Lungs are clear. Heart size and pulmonary vascularity are normal. No adenopathy. Supine and upright abdomen: There is diffuse stool throughout much of the colon. There is no bowel dilatation or air-fluid level to suggest bowel obstruction. No free air. No abnormal calcifications. IMPRESSION: Diffuse stool throughout colon. Suspect a degree of constipation. No bowel obstruction or free air. Lungs clear. Electronically Signed   By: Lowella Grip III M.D.   On: 05/21/2019 14:33    Procedures Procedures (including critical care time)  Medications Ordered in ED Medications  sodium chloride 0.9 % bolus 500 mL (500 mLs Intravenous New Bag/Given 05/22/19 1420)    ED Course  I have reviewed the triage vital signs and the nursing notes.  Pertinent labs & imaging results that were available during my care of the patient were reviewed by me and considered in my medical decision making (see chart for details).    MDM Rules/Calculators/A&P                      Patient sent in with positive D-dimer and mildly elevated lipase.  Sent in for CT scans.  Will repeat lipase and basic metabolic.  CT scans ordered.  Care turned over to Dr. Maryan Rued. Final Clinical Impression(s) / ED Diagnoses Final diagnoses:  None    Rx / DC Orders ED Discharge Orders    None       Davonna Belling, MD 05/22/19 1512

## 2019-05-22 NOTE — Telephone Encounter (Signed)
New message    The daughter Henry Adams calling @ [facility] [place] with her father.   Her Father is going for a CT scan now wanted  Dr. Ronnald Ramp to be aware that he allergic to Iodine.   Making sure the CT scan is safe.

## 2019-05-22 NOTE — ED Triage Notes (Addendum)
Patient was told to come to the ED for lipase-69, d-dimer 3.15. Patient told his daughter that he was unable to take a deep breath today. Patient also c/o mid abdominal pain

## 2019-05-22 NOTE — Discharge Instructions (Signed)
The testing today showed no evidence of a blood clot, kidney function is still good and CAT scan did not show any new findings except that a mild enlargement of your aorta that needs to be followed yearly.  Your doctor can follow-up on this.  If you are still having symptoms it will be important to follow-up with your doctor.  If you start having significant shortness of breath, vomiting, fever or severe pain that does not improve over the weekend you should return to the emergency room.

## 2019-05-22 NOTE — ED Provider Notes (Signed)
Currently patient's labs do not have any new acute findings.  Creatinine is stable, lipase is now within normal limits.  CBC from yesterday without acute findings.  Patient has an iodine/Mercurochrome allergy and has not had a CT with contrast since 2008.  At that time it is not documented if he needed pretreatment.  Patient does not know if he needed pretreatment and it is unclear if he can have IV contrast.  At this time we will do CT of the chest abdomen and pelvis without.  If there is another cause for his symptoms that would explain his new acute issue may not need to do further evaluation.  However if that is negative then patient will need a VQ scan.  6:51 PM Patient reports currently he is hungry but is not having any pain at this time.  He states standing definitely made it worse but he is just not felt himself all day.  10:08 PM Troponin is negative as well as his level yesterday low suspicion that this is cardiac in nature.  VQ scan shows very low probability of PE.  Findings discussed with the patient and his family.  This time feel that he is safe for discharge.  He will follow up with his PCP and if he continues to have issues may need to see GI in the future.   Blanchie Dessert, MD 05/22/19 2208

## 2019-05-22 NOTE — Telephone Encounter (Signed)
Pt/family is calling about the CT that was ordered yesterday by Dr. Ronnald Ramp. Can you follow-up w/them on when it may be schedule. She stated MD wanted it done today.Henry KitchenJohny Chess

## 2019-05-25 NOTE — Telephone Encounter (Signed)
Pt has iodine listed in his allergy's.

## 2019-05-28 NOTE — Progress Notes (Signed)
Assessment/Plan:   1.  Parkinsonism  -change IR carbidopa/levodopa 25/100 to CR, 1 tablet at 7 AM/11 AM/3 PM and 1.5 tablets at 7 PM to see if will help cognition at all.   -Continue carbidopa/levodopa 25/100 CR at bedtime   2. Peripheral neuropathy with hx B12 def -on supplement  3. Depression and anxiety -seeing Dr. Clovis Pu             -think that this is one of the primary issues affecting health  4.RLS -Continue clonazepam, 0.5 mg at bedtime.  -Clonazepam now being prescribed by psychiatry, as he is getting an additional 0.25 mg in the afternoon for anxiety  5.Constipation -following with GI -increase hydration  6.  Mild cognitive impairment             -Patient had neurocognitive testing with Dr. Melvyn Novas in August, 2020 with evidence of mild cognitive impairment.  Testing was similar to that in May, 2019.  Daughter believes sx's progressed. certainly possible he may have developed some level of dementia at this point.  Unable to retest d/t insurance constraints but do not think it matters given that he is already on donepezil, 10mg , RX by Dr. Clovis Pu.   Subjective:   Henry Adams was seen today in follow up for Parkinsons disease.  My previous records were reviewed prior to todays visit as well as outside records available to me.  I have also received numerous messages from the patient's daughter since our last visit and have responded to those.  I have also talked to our social worker who has communicated with the patient's daughter.  Pt last saw Dr. Clovis Pu on March 18.  Patient was in assisted living in February, but then they brought him home because he was agitated.  They regretted that decision and then went back into assisted living.  They distribute medication, provide food.  He does his own dressing with the exception of compression stockings and hearing aids.  No falls.  He is walking for exercise.  When  asked about hallucinations, he says "not very often."  He describes it more as a visual distortion.  Daughter describes some illusion - dirt on the floor may look like a bug.  Daughter thinks that there are misinterpretations - someone died today and he said that there was a Midwife.  Patient expresses frustration that he has very little control over his schedule and everything is taken over for him.  States that he basically has no autonomy and it is depressing, although he appreciates the care his daughter gives him.  Current prescribed movement disorder medications: Carbidopa/levodopa 25/100, 1 tablet at 7 AM/11 AM/3 PM/and 1.5 tablets at 7 PM Carbidopa/levodopa 25/100 CR at bedtime Donepezil (prescribed by Dr. Clovis Pu), 10 mg Clonazepam 0.5 mg at bedtime (Dr. Clovis Pu added another 0.25 mg at 3pm)  PREVIOUS MEDICATIONS: carbidopa/levodopa 50/200 at bedtime (patient did not think it helped and thought had more side effects)  Past Psychiatric History from Dr. Cottle:History of Lexapro 20helped awhile but partially effective for anxiety and depression  Trintellix SE after 4-5 week without benefit.  Duloxetine 20 Ritalin 5 BID Clonazepam helped anxiety and RLS and sleep History Wellbutrin XL 300,  trazodone for insomniaand didn't help the RLS. History of Seroquel 25Rx. Never taken. Melatonin dizzy ALLERGIES:   Allergies  Allergen Reactions  . Iodine     rash  . Oxycodone     Mental status changes & nausea  . Pollen Extract Itching  .  Tamiflu [Oseltamivir Phosphate] Other (See Comments)    hallucinations  . Tamsulosin     Other reaction(s): Other (See Comments) Unknown   . Doxazosin Rash    agitation  . Etodolac Rash    unknown  . Tape Rash    Certain "Band-Aids"    CURRENT MEDICATIONS:  Outpatient Encounter Medications as of 05/29/2019  Medication Sig  . acetaminophen (TYLENOL) 500 MG tablet 500 mg prn for back, knee, shoulder or head pain. May be given up to every 6  hours.  [name] alfuzosin (UROXATRAL) 10 MG 24 hr tablet Take 1 tablet (10 mg total) by mouth 2 (two) times daily. 10 mg at 7 am and 7 pm daily.  . carbidopa-levodopa (SINEMET IR) 25-100 MG tablet TAKE 1 TABLET AT 7AM, 11AM, 3PM AND TAKE 1&1/2 TABLETS AT 7PM.  . Carbidopa-Levodopa ER (SINEMET CR) 25-100 MG tablet controlled release TAKE ONE TABLET AT BEDTIME. (Patient taking differently: Take 1 tablet by mouth at bedtime. )  . clonazePAM (KLONOPIN) 0.5 MG tablet Take 1/2-1 tablet by mouth during the day as needed for anxiety/agitation and take 1.5 tablets at bedtime as needed for sleep/anxiety (Patient taking differently: Take 0.25-0.5 mg by mouth See admin instructions. Take 0.25mg  at 3pm and 0.5mg  at 9pm)  . docusate sodium (COLACE) 100 MG capsule Take 1 capsule (100 mg total) by mouth 2 (two) times daily. 1 capsule at 7am and 3pm daily. May be stopped temporarily if patient experiences loose stool.  . donepezil (ARICEPT) 10 MG tablet Take 1 tablet (10 mg total) by mouth at bedtime.  . fexofenadine (ALLEGRA) 180 MG tablet 180mg  tablet at 11 am. (Patient taking differently: Take 180 mg by mouth daily. 11 am.)  . finasteride (PROSCAR) 5 MG tablet 5mg  at 7pm daily (Patient taking differently: Take 5 mg by mouth every evening. 7pm)  . lubiprostone (AMITIZA) 24 MCG capsule 24 mcg capsule at 7am and 7pm daily. (Patient taking differently: Take 24 mcg by mouth 2 (two) times daily with a meal. )  . omeprazole (PRILOSEC) 40 MG capsule TAKE (1) CAPSULE DAILY. (Patient taking differently: Take 40 mg by mouth daily. )  . polyethylene glycol (MIRALAX) 17 g packet 17 g up to 3 times per day as needed. May stop if patient has loose stool. (Patient taking differently: Take 17 g by mouth 2 (two) times daily as needed for mild constipation. )  . Probiotic Product ([place]) Take by mouth.  . vitamin B-12 (CYANOCOBALAMIN) 1000 MCG tablet 1000 mcg tablet at 11 am daily. (Patient taking differently: Take 1,000  mcg by mouth daily. )   No facility-administered encounter medications on file as of 05/29/2019.    Objective:   PHYSICAL EXAMINATION:    VITALS:   Vitals:   05/29/19 1423  BP: 130/77  Pulse: 65  SpO2: 97%  Weight: 164 lb (74.4 kg)  Height: 5\' 11"  (1.803 m)    GEN:  The patient appears stated age and is in NAD. HEENT:  Normocephalic, atraumatic.  The mucous membranes are moist. The superficial temporal arteries are without ropiness or tenderness. CV:  RRR Lungs:  CTAB Neck/HEME:  There are no carotid bruits bilaterally.  Neurological examination:  Orientation: The patient is alert and oriented x3. Cranial nerves: There is good facial symmetry with facial hypomimia. The speech is fluent and clear, with significant hypomimia. Soft palate rises symmetrically and there is no tongue deviation. Hearing is intact to conversational tone. Sensation: Sensation is intact to light touch throughout Motor:  Strength is at least antigravity x4.  Movement examination: Tone: There is normal tone in the upper and lower extremities Abnormal movements: None Coordination:  There is no significant decremation with RAM's, but he is apraxic when asked to perform some of these commands Gait and Station: The patient ambulates short stepped, but he actually does fairly well. I have reviewed and interpreted the following labs independently    Chemistry      Component Value Date/Time   NA 139 05/22/2019 1421   K 3.8 05/22/2019 1421   CL 106 05/22/2019 1421   CO2 25 05/22/2019 1421   BUN 31 (H) 05/22/2019 1421   CREATININE 1.27 (H) 05/22/2019 1421      Component Value Date/Time   CALCIUM 8.4 (L) 05/22/2019 1421   ALKPHOS 72 05/21/2019 1355   AST 16 05/21/2019 1355   ALT 8 05/21/2019 1355   BILITOT 0.9 05/21/2019 1355       Lab Results  Component Value Date   WBC 6.2 05/21/2019   HGB 12.6 (L) 05/21/2019   HCT 37.7 (L) 05/21/2019   MCV 94.7 05/21/2019   PLT 152.0 05/21/2019    Lab  Results  Component Value Date   TSH 1.36 06/25/2018   Lab Results  Component Value Date   VITAMINB12 361 11/14/2016     Total time spent on today's visit was 30 minutes, including both face-to-face time and nonface-to-face time.  Time included that spent on review of records (prior notes available to me/labs/imaging if pertinent), discussing treatment and goals, answering patient's questions and coordinating care.  Cc:  [name], MD

## 2019-05-29 ENCOUNTER — Other Ambulatory Visit: Payer: Self-pay

## 2019-05-29 ENCOUNTER — Telehealth: Payer: Self-pay | Admitting: Neurology

## 2019-05-29 ENCOUNTER — Ambulatory Visit (INDEPENDENT_AMBULATORY_CARE_PROVIDER_SITE_OTHER): Payer: Medicare Other | Admitting: Neurology

## 2019-05-29 ENCOUNTER — Encounter: Payer: Self-pay | Admitting: Neurology

## 2019-05-29 VITALS — BP 130/77 | HR 65 | Ht 71.0 in | Wt 164.0 lb

## 2019-05-29 DIAGNOSIS — G20 Parkinson's disease: Secondary | ICD-10-CM

## 2019-05-29 DIAGNOSIS — F331 Major depressive disorder, recurrent, moderate: Secondary | ICD-10-CM | POA: Diagnosis not present

## 2019-05-29 MED ORDER — CARBIDOPA-LEVODOPA ER 25-100 MG PO TBCR: EXTENDED_RELEASE_TABLET | ORAL | 1 refills | Status: DC

## 2019-05-29 NOTE — Telephone Encounter (Signed)
Daughter is concerned that dad's health is declining. She states has dad is having hallucinations. She thinks he has some type of dementia. She spoke with psychiatrist and he thinks the Carbidopa levodopa causes hallucinations.   Has some questions that she wants to ask about the patients medication. Should her dad take 1 tablet of carbidopa levodopa at 7? Should the Aricept be changed to something else.    Daughter would like to have a private conversation about patient either before or after appointment. She sthen shestates if she needs to pay for a private consultation she will.   Advised her that these issues can be addressed at the appt time and she stated its hard to have a conversation with the patient present because he does not think he has any hallucinations.

## 2019-05-29 NOTE — Telephone Encounter (Signed)
Patients daughter requests to speak with Dr. Carles Collet prior to appointment today regarding patients mental status and possible hallucinations in a private setting. Please call.

## 2019-05-29 NOTE — Patient Instructions (Signed)
Stop carbidopa/levodopa 25/100 IR (daytime yellow tab)  Take carbidopa/levodopa 25/100 CR 1 tablet at 7 AM/11 AM/3 PM/1.5 tablets at 7 PM/1 at 9pm

## 2019-06-12 ENCOUNTER — Telehealth: Payer: Self-pay | Admitting: Internal Medicine

## 2019-06-12 NOTE — Progress Notes (Signed)
  Chronic Care Management   Note  06/12/2019 Name: Henry Adams MRN: [id number] DOB: [birth year]  Henry Adams is a 79 y.o. year old male who is a primary care patient of [name], MD. I reached out to Seward Speck by phone today in response to a referral sent by Henry Adams PCP, [name], MD.   Henry Adams was given information about Chronic Care Management services today including:  1. CCM service includes personalized support from designated clinical staff supervised by his physician, including individualized plan of care and coordination with other care providers 2. 24/7 contact phone numbers for assistance for urgent and routine care needs. 3. Service will only be billed when office clinical staff spend 20 minutes or more in a month to coordinate care. 4. Only one practitioner may furnish and bill the service in a calendar month. 5. The patient may stop CCM services at any time (effective at the end of the month) by phone call to the office staff.   Patient wishes to consider information provided and/or speak with a member of the care team before deciding about enrollment in care management services.   This note is not being shared with the patient for the following reason: To respect privacy (The patient or proxy has requested that the information not be shared).  Follow up plan:   Earney [place] Upstream Scheduler

## 2019-07-02 ENCOUNTER — Other Ambulatory Visit: Payer: Self-pay | Admitting: Internal Medicine

## 2019-07-02 ENCOUNTER — Encounter: Payer: Self-pay | Admitting: Internal Medicine

## 2019-07-02 DIAGNOSIS — R42 Dizziness and giddiness: Secondary | ICD-10-CM

## 2019-07-02 MED ORDER — MECLIZINE HCL 12.5 MG PO TABS: 12.5000 mg | ORAL_TABLET | Freq: Three times a day (TID) | ORAL | 2 refills | Status: DC | PRN

## 2019-07-06 ENCOUNTER — Telehealth: Payer: Self-pay | Admitting: Internal Medicine

## 2019-07-06 ENCOUNTER — Encounter: Payer: Self-pay | Admitting: Internal Medicine

## 2019-07-06 ENCOUNTER — Other Ambulatory Visit: Payer: Self-pay

## 2019-07-06 ENCOUNTER — Ambulatory Visit (INDEPENDENT_AMBULATORY_CARE_PROVIDER_SITE_OTHER): Payer: Medicare Other | Admitting: Internal Medicine

## 2019-07-06 VITALS — BP 130/70 | HR 74 | Temp 98.1°F | Resp 16 | Ht 71.0 in | Wt 167.4 lb

## 2019-07-06 DIAGNOSIS — L03115 Cellulitis of right lower limb: Secondary | ICD-10-CM | POA: Diagnosis not present

## 2019-07-06 DIAGNOSIS — K5900 Constipation, unspecified: Secondary | ICD-10-CM | POA: Diagnosis not present

## 2019-07-06 MED ORDER — POLYETHYLENE GLYCOL 3350 17 G PO PACK: 17.0000 g | PACK | Freq: Every day | ORAL | 1 refills | Status: DC | PRN

## 2019-07-06 MED ORDER — SULFAMETHOXAZOLE-TRIMETHOPRIM 800-160 MG PO TABS: 1.0000 | ORAL_TABLET | Freq: Two times a day (BID) | ORAL | 0 refills | Status: AC

## 2019-07-06 NOTE — Patient Instructions (Signed)

## 2019-07-06 NOTE — Progress Notes (Signed)
Subjective:  Patient ID: Henry Adams, male    DOB: [birth year]  Age: 79 y.o. MRN: [id number]  CC: Cellulitis  This visit occurred during the SARS-CoV-2 public health emergency.  Safety protocols were in place, including screening questions prior to the visit, additional usage of staff PPE, and extensive cleaning of exam room while observing appropriate contact time as indicated for disinfecting solutions.    HPI Henry Adams presents for concerns about a red, painful area over his right calf for the last 5 days.  Outpatient Medications Prior to Visit  Medication Sig Dispense Refill  . acetaminophen (TYLENOL) 500 MG tablet 500 mg prn for back, knee, shoulder or head pain. May be given up to every 6 hours. 90 tablet 3  . alfuzosin (UROXATRAL) 10 MG 24 hr tablet Take 1 tablet (10 mg total) by mouth 2 (two) times daily. 10 mg at 7 am and 7 pm daily.    . Carbidopa-Levodopa ER (SINEMET CR) 25-100 MG tablet controlled release 1 tablet at 7 AM/11 AM/3 PM/1.5 tablets at 7 PM/1 at 9pm 540 tablet 1  . clonazePAM (KLONOPIN) 0.5 MG tablet Take 1/2-1 tablet by mouth during the day as needed for anxiety/agitation and take 1.5 tablets at bedtime as needed for sleep/anxiety (Patient taking differently: Take 0.25-0.5 mg by mouth See admin instructions. Take 0.25mg  at 3pm and 0.5mg  at 9pm) 75 tablet 0  . docusate sodium (COLACE) 100 MG capsule Take 1 capsule (100 mg total) by mouth 2 (two) times daily. 1 capsule at 7am and 3pm daily. May be stopped temporarily if patient experiences loose stool. 30 capsule 11  . donepezil (ARICEPT) 10 MG tablet Take 1 tablet (10 mg total) by mouth at bedtime. 90 tablet 1  . fexofenadine (ALLEGRA) 180 MG tablet 180mg  tablet at 11 am. (Patient taking differently: Take 180 mg by mouth daily. 11 am.) 90 tablet 3  . finasteride (PROSCAR) 5 MG tablet 5mg  at 7pm daily (Patient taking differently: Take 5 mg by mouth every evening. 7pm) 90 tablet 3  . lubiprostone (AMITIZA) 24 MCG  capsule 24 mcg capsule at 7am and 7pm daily. (Patient taking differently: Take 24 mcg by mouth 2 (two) times daily with a meal. ) 180 capsule 3  . meclizine (ANTIVERT) 12.5 MG tablet Take 1 tablet (12.5 mg total) by mouth 3 (three) times daily as needed. 90 tablet 2  . omeprazole (PRILOSEC) 40 MG capsule TAKE (1) CAPSULE DAILY. (Patient taking differently: Take 40 mg by mouth daily. ) 90 capsule 0  . Probiotic Product ([place]) Take by mouth.    . vitamin B-12 (CYANOCOBALAMIN) 1000 MCG tablet 1000 mcg tablet at 11 am daily. (Patient taking differently: Take 1,000 mcg by mouth daily. ) 90 tablet 3  . polyethylene glycol (MIRALAX) 17 g packet 17 g up to 3 times per day as needed. May stop if patient has loose stool. (Patient taking differently: Take 17 g by mouth 2 (two) times daily as needed for mild constipation. ) 17 each 3   No facility-administered medications prior to visit.    ROS Review of Systems  Constitutional: Negative for chills, fatigue and fever.  HENT: Negative.   Eyes: Negative for visual disturbance.  Respiratory: Negative.  Negative for cough, chest tightness, shortness of breath and wheezing.   Cardiovascular: Negative for chest pain, palpitations and leg swelling.  Gastrointestinal: Positive for constipation. Negative for abdominal pain.  Endocrine: Negative.   Genitourinary: Negative for difficulty urinating.  Musculoskeletal: Negative.  Negative for arthralgias and myalgias.  Skin: Positive for color change and rash. Negative for wound.  Neurological: Positive for dizziness. Negative for light-headedness.  Hematological: Negative for adenopathy. Does not bruise/bleed easily.  Psychiatric/Behavioral: Negative.     Objective:  BP 130/70 (BP Location: Left Arm, Patient Position: Sitting, Cuff Size: Normal)   Pulse 74   Temp 98.1 F (36.7 C) (Oral)   Resp 16   Ht 5\' 11"  (1.803 m)   Wt 167 lb 6 oz (75.9 kg)   SpO2 96%   BMI 23.34 kg/m   BP  Readings from Last 3 Encounters:  07/06/19 130/70  05/29/19 130/77  05/22/19 (!) 176/111    Wt Readings from Last 3 Encounters:  07/06/19 167 lb 6 oz (75.9 kg)  05/29/19 164 lb (74.4 kg)  05/22/19 166 lb 3.2 oz (75.4 kg)    Physical Exam Vitals reviewed.  Constitutional:      Appearance: Normal appearance.  HENT:     Nose: Nose normal.     Mouth/Throat:     Mouth: Mucous membranes are moist.  Eyes:     Conjunctiva/sclera: Conjunctivae normal.  Cardiovascular:     Rate and Rhythm: Normal rate and regular rhythm.     Heart sounds: No murmur heard.   Pulmonary:     Effort: Pulmonary effort is normal.     Breath sounds: No stridor. No wheezing, rhonchi or rales.  Abdominal:     General: Abdomen is protuberant. Bowel sounds are normal. There is no distension.     Palpations: Abdomen is soft. There is no hepatomegaly, splenomegaly or mass.     Tenderness: There is no abdominal tenderness.  Musculoskeletal:        General: Normal range of motion.     Cervical back: Neck supple. No rigidity.     Right lower leg: No edema.     Left lower leg: No edema.  Skin:    General: Skin is warm.     Findings: Erythema and rash present.     Comments: Right posterior calf there is a 2 cm x 0.5 cm irregularly shaped but slightly linear area of erythema, induration, and warmth.  There is no fluctuance or exudate.  The overlying skin is otherwise normal.  Neurological:     Mental Status: Henry Adams is alert.     Lab Results  Component Value Date   WBC 6.2 05/21/2019   HGB 12.6 (L) 05/21/2019   HCT 37.7 (L) 05/21/2019   PLT 152.0 05/21/2019   GLUCOSE 136 (H) 05/22/2019   CHOL 179 11/14/2016   TRIG 73.0 11/14/2016   HDL 68.50 11/14/2016   LDLDIRECT 131.7 07/02/2011   LDLCALC 96 11/14/2016   ALT 8 05/21/2019   AST 16 05/21/2019   NA 139 05/22/2019   K 3.8 05/22/2019   CL 106 05/22/2019   CREATININE 1.27 (H) 05/22/2019   BUN 31 (H) 05/22/2019   CO2 25 05/22/2019   TSH 1.36 06/25/2018    PSA 2.28 08/11/2015   INR 1.1 (H) 08/01/2012   HGBA1C 5.8 12/29/2018    CT ABDOMEN PELVIS WO CONTRAST  Result Date: 05/22/2019 CLINICAL DATA:  Epigastric and left chest pain, mildly elevated lipase (69), elevated D-dimer EXAM: CT CHEST, ABDOMEN AND PELVIS WITHOUT CONTRAST TECHNIQUE: Multidetector CT imaging of the chest, abdomen and pelvis was performed following the standard protocol without IV contrast. COMPARISON:  CT abdomen pelvis 04/20/2019, chest radiograph Jun 16, 2018, CT chest 09/23/2000 (report only, images unavailable) FINDINGS: CT CHEST FINDINGS Cardiovascular:  The aortic root is suboptimally assessed given cardiac pulsation artifact. Gross preservation of the sino-tubular junction. Mild dilatation of the ascending thoracic aorta to 4.4 cm with return to a normal aortic caliber at the level of the distal arch measuring 2.6 cm. Mild atherosclerotic calcification of the thoracic aorta. No abnormal hyperdense mural thickening or plaque displacement to suggest intramural hematoma. No periaortic stranding or hemorrhage. Normal 3 vessel branching of the aortic arch with minimal calcification of the proximal great vessels. Central pulmonary arteries are normal caliber. Normal heart size. Trace pericardial fluid likely within physiologic normal and unchanged from comparison CT. Extensive three-vessel coronary artery calcifications are noted. Luminal evaluation of the vasculature precluded in the absence of contrast media. Mediastinum/Nodes: No mediastinal fluid or gas. Normal thyroid gland and thoracic inlet. No acute abnormality of the trachea or esophagus. No worrisome mediastinal or axillary adenopathy. Hilar nodal evaluation is limited in the absence of intravenous contrast media. Lungs/Pleura: Bandlike area of opacity in the right lung base compatible with subsegmental atelectasis and/or scarring. More dependent atelectasis seen posteriorly in the lung bases. No consolidation, features of edema,  pneumothorax, or effusion. No suspicious pulmonary nodules or masses. Musculoskeletal: Multilevel degenerative changes are present in the imaged portions of the spine. No acute osseous abnormality or suspicious osseous lesion. No chest wall mass or suspicious soft tissue lesions identified. Paucity of subcutaneous fat. CT ABDOMEN PELVIS FINDINGS Hepatobiliary: Redemonstration of the innumerable hypoattenuating foci throughout the liver, many of which are too small to characterize but likely reflect benign hepatic cysts. Fluid attenuation cyst in the left lobe again measures up to 3.2 cm (2/54). No worrisome liver lesion visible within the unenhanced liver. Gallbladder is largely decompressed at the time of exam. No frank biliary ductal dilatation or visible calcified gallstones. Pancreas: Pancreas is anteriorly displaced by the multi-cystic renal parenchyma. No visible pancreatic lesions. No peripancreatic inflammation or evidence of ductal dilatation. Spleen: Spleen is displaced anteriorly. Otherwise unremarkable and normal size without focal splenic lesion. Adrenals/Urinary Tract: Adrenal glands are difficult to discern from the multi-cystic renal parenchyma. There are numerous bilateral renal cysts of varying size and complexity including several intermediate attenuation foci in the upper pole right kidney (2/56, 62) and lower pole left kidney (2/96). Multiple areas of ill-defined calcification seen within the multi cystic renal parenchyma which could reflect septal calcification and or nonobstructing calculi. Difficult to discern the normal renal parenchyma and collecting system. No visible calcifications along the expected course of either ureter nor visible lodged within the otherwise normal urinary bladder. Some mild indentation of the posterior bladder by the enlarged prostate. Stomach/Bowel: Technically difficult assessment of the bowel due to a paucity of intraperitoneal fat and the large multi cystic  kidneys. Distal esophagus and stomach are free of acute abnormality. Duodenal sweep crosses the midline abdomen. Much of the large and small bowel is displaced anteriorly. No gross bowel wall thickening, dilatation, or evidence of obstruction. The appendix is not identified. Vascular/Lymphatic: Atherosclerotic plaque within the normal caliber aorta. Evaluation of the lymph nodes is limited given a paucity of intraperitoneal fat and lack of intravenous contrast material. No gross adenopathy is appreciable. Reproductive: Coarse eccentric calcification of the mildly enlarged prostate. No concerning abnormalities of the prostate or seminal vesicles. Benign calcifications of the penile corpora. Other: No abdominopelvic free fluid or free gas. No bowel containing hernias. Paucity of intraperitoneal and subcutaneous fat. Musculoskeletal: Multilevel degenerative changes are present in the imaged portions of the spine. Mild grade 1 anterolisthesis L4 on L5  without associated pars defect. No acute osseous abnormality or suspicious osseous lesion. Additional mild degenerative changes in the hips and SI joints. IMPRESSION: 1. No definite acute abnormality seen in the chest, abdomen or pelvis. Specifically no convincing CT features of acute pancreatitis. Elevation of the D-dimer is nonspecific and exclusion of a pulmonary embolus is precluded on this exam given the absence of intravenous contrast media. 2. Extensive multicystic appearance of the liver and kidneys with varying degrees of size and complexity within the renal cysts most compatible with autosomal dominant polycystic kidney disease. Appearance of several complex and hyperdense cyst is grossly unchanged from prior. 3. Mild dilatation of the ascending thoracic aorta to 4.4 cm with return to a normal aortic caliber at the level of the distal arch measuring 2.6 cm. Recommend annual imaging followup by CTA or MRA. This recommendation follows 2010  ACCF/AHA/AATS/ACR/ASA/SCA/SCAI/SIR/STS/SVM Guidelines for the Diagnosis and Management of Patients with Thoracic Aortic Disease. Circulation. 2010; 121: Y650-P546. Aortic aneurysm NOS (ICD10-I71.9) 4. Coronary artery calcifications are present. Please note that the presence of coronary artery calcium documents the presence of coronary artery disease, the severity of this disease and any potential stenosis cannot be assessed on this non-gated CT examination. Assessment for potential risk factor modification, dietary therapy or pharmacologic therapy may be warranted. 5. Aortic Atherosclerosis (ICD10-I70.0). 6. Mild grade 1 anterolisthesis L4 on L5 without associated pars defect. 7. Mild prostatomegaly. 8. Paucity of subcutaneous and intraperitoneal fat, correlate with nutritional status. Electronically Signed   By: Lovena Le M.D.   On: 05/22/2019 18:29   CT Chest Wo Contrast  Result Date: 05/22/2019 CLINICAL DATA:  Epigastric and left chest pain, mildly elevated lipase (69), elevated D-dimer EXAM: CT CHEST, ABDOMEN AND PELVIS WITHOUT CONTRAST TECHNIQUE: Multidetector CT imaging of the chest, abdomen and pelvis was performed following the standard protocol without IV contrast. COMPARISON:  CT abdomen pelvis 04/20/2019, chest radiograph Jun 16, 2018, CT chest 09/23/2000 (report only, images unavailable) FINDINGS: CT CHEST FINDINGS Cardiovascular: The aortic root is suboptimally assessed given cardiac pulsation artifact. Gross preservation of the sino-tubular junction. Mild dilatation of the ascending thoracic aorta to 4.4 cm with return to a normal aortic caliber at the level of the distal arch measuring 2.6 cm. Mild atherosclerotic calcification of the thoracic aorta. No abnormal hyperdense mural thickening or plaque displacement to suggest intramural hematoma. No periaortic stranding or hemorrhage. Normal 3 vessel branching of the aortic arch with minimal calcification of the proximal great vessels. Central  pulmonary arteries are normal caliber. Normal heart size. Trace pericardial fluid likely within physiologic normal and unchanged from comparison CT. Extensive three-vessel coronary artery calcifications are noted. Luminal evaluation of the vasculature precluded in the absence of contrast media. Mediastinum/Nodes: No mediastinal fluid or gas. Normal thyroid gland and thoracic inlet. No acute abnormality of the trachea or esophagus. No worrisome mediastinal or axillary adenopathy. Hilar nodal evaluation is limited in the absence of intravenous contrast media. Lungs/Pleura: Bandlike area of opacity in the right lung base compatible with subsegmental atelectasis and/or scarring. More dependent atelectasis seen posteriorly in the lung bases. No consolidation, features of edema, pneumothorax, or effusion. No suspicious pulmonary nodules or masses. Musculoskeletal: Multilevel degenerative changes are present in the imaged portions of the spine. No acute osseous abnormality or suspicious osseous lesion. No chest wall mass or suspicious soft tissue lesions identified. Paucity of subcutaneous fat. CT ABDOMEN PELVIS FINDINGS Hepatobiliary: Redemonstration of the innumerable hypoattenuating foci throughout the liver, many of which are too small to characterize but likely  reflect benign hepatic cysts. Fluid attenuation cyst in the left lobe again measures up to 3.2 cm (2/54). No worrisome liver lesion visible within the unenhanced liver. Gallbladder is largely decompressed at the time of exam. No frank biliary ductal dilatation or visible calcified gallstones. Pancreas: Pancreas is anteriorly displaced by the multi-cystic renal parenchyma. No visible pancreatic lesions. No peripancreatic inflammation or evidence of ductal dilatation. Spleen: Spleen is displaced anteriorly. Otherwise unremarkable and normal size without focal splenic lesion. Adrenals/Urinary Tract: Adrenal glands are difficult to discern from the multi-cystic  renal parenchyma. There are numerous bilateral renal cysts of varying size and complexity including several intermediate attenuation foci in the upper pole right kidney (2/56, 62) and lower pole left kidney (2/96). Multiple areas of ill-defined calcification seen within the multi cystic renal parenchyma which could reflect septal calcification and or nonobstructing calculi. Difficult to discern the normal renal parenchyma and collecting system. No visible calcifications along the expected course of either ureter nor visible lodged within the otherwise normal urinary bladder. Some mild indentation of the posterior bladder by the enlarged prostate. Stomach/Bowel: Technically difficult assessment of the bowel due to a paucity of intraperitoneal fat and the large multi cystic kidneys. Distal esophagus and stomach are free of acute abnormality. Duodenal sweep crosses the midline abdomen. Much of the large and small bowel is displaced anteriorly. No gross bowel wall thickening, dilatation, or evidence of obstruction. The appendix is not identified. Vascular/Lymphatic: Atherosclerotic plaque within the normal caliber aorta. Evaluation of the lymph nodes is limited given a paucity of intraperitoneal fat and lack of intravenous contrast material. No gross adenopathy is appreciable. Reproductive: Coarse eccentric calcification of the mildly enlarged prostate. No concerning abnormalities of the prostate or seminal vesicles. Benign calcifications of the penile corpora. Other: No abdominopelvic free fluid or free gas. No bowel containing hernias. Paucity of intraperitoneal and subcutaneous fat. Musculoskeletal: Multilevel degenerative changes are present in the imaged portions of the spine. Mild grade 1 anterolisthesis L4 on L5 without associated pars defect. No acute osseous abnormality or suspicious osseous lesion. Additional mild degenerative changes in the hips and SI joints. IMPRESSION: 1. No definite acute abnormality seen  in the chest, abdomen or pelvis. Specifically no convincing CT features of acute pancreatitis. Elevation of the D-dimer is nonspecific and exclusion of a pulmonary embolus is precluded on this exam given the absence of intravenous contrast media. 2. Extensive multicystic appearance of the liver and kidneys with varying degrees of size and complexity within the renal cysts most compatible with autosomal dominant polycystic kidney disease. Appearance of several complex and hyperdense cyst is grossly unchanged from prior. 3. Mild dilatation of the ascending thoracic aorta to 4.4 cm with return to a normal aortic caliber at the level of the distal arch measuring 2.6 cm. Recommend annual imaging followup by CTA or MRA. This recommendation follows 2010 ACCF/AHA/AATS/ACR/ASA/SCA/SCAI/SIR/STS/SVM Guidelines for the Diagnosis and Management of Patients with Thoracic Aortic Disease. Circulation. 2010; 121: [id number]. Aortic aneurysm NOS (ICD10-I71.9) 4. Coronary artery calcifications are present. Please note that the presence of coronary artery calcium documents the presence of coronary artery disease, the severity of this disease and any potential stenosis cannot be assessed on this non-gated CT examination. Assessment for potential risk factor modification, dietary therapy or pharmacologic therapy may be warranted. 5. Aortic Atherosclerosis (ICD10-I70.0). 6. Mild grade 1 anterolisthesis L4 on L5 without associated pars defect. 7. Mild prostatomegaly. 8. Paucity of subcutaneous and intraperitoneal fat, correlate with nutritional status. Electronically Signed   By: Lovena Le  M.D.   On: 05/22/2019 18:29   NM PULMONARY VENT AND PERF (V/Q Scan)  Result Date: 05/22/2019 CLINICAL DATA:  Epigastric and left-sided chest pain EXAM: NUCLEAR MEDICINE VENTILATION - PERFUSION LUNG SCAN TECHNIQUE: Ventilation images were obtained in multiple projections using inhaled aerosol Tc-55m DTPA. Perfusion images were obtained in multiple  projections after intravenous injection of Tc-76m MAA. RADIOPHARMACEUTICALS:  40.9 mCi of Tc-45m DTPA aerosol inhalation and 1.6 mCi Tc29m MAA IV COMPARISON:  CT same day FINDINGS: Ventilation: There is a small ventilation defects seen within the right lower lobe and heights is artery to of a there neck and height is Dr. Lorenso Courier to the head high on the abdomen CT psychosis from same day cardiac CT or CT at yet number on the back left lower lobe. No other ventilation defects are seen. Perfusion: There is also a small perfusion defect seen within the right lower lobe and left lower lobe. No large perfusion defects are seen. IMPRESSION: Two matched small perfusion defects within the right lower lobe and left lower lobe with very low probability of pulmonary embolism based on PIOPED criteria. Electronically Signed   By: Prudencio Pair M.D.   On: 05/22/2019 22:01    Assessment & Plan:   Henry Adams was seen today for cellulitis.  Diagnoses and all orders for this visit:  Constipation, unspecified constipation type -     Discontinue: polyethylene glycol (MIRALAX) 17 g packet; Take 17 g by mouth daily as needed for mild constipation. -     polyethylene glycol (MIRALAX) 17 g packet; Take 17 g by mouth daily as needed for mild constipation.  Cellulitis of right lower extremity - I am concerned this may be staph, possibly MRSA.  I recommended that Henry Adams take a 7-day course of Bactrim DS. -     sulfamethoxazole-trimethoprim (BACTRIM DS) 800-160 MG tablet; Take 1 tablet by mouth 2 (two) times daily for 7 days.   I am having Henry Adams start on sulfamethoxazole-trimethoprim. I am also having him maintain his lubiprostone, fexofenadine, finasteride, docusate sodium, acetaminophen, vitamin B-12, clonazePAM, omeprazole, alfuzosin, Probiotic Product ([place]), donepezil, Carbidopa-Levodopa ER, meclizine, and polyethylene glycol.  Meds ordered this encounter  Medications  . DISCONTD: polyethylene glycol  (MIRALAX) 17 g packet    Sig: Take 17 g by mouth daily as needed for mild constipation.    Dispense:  72 packet    Refill:  1  . polyethylene glycol (MIRALAX) 17 g packet    Sig: Take 17 g by mouth daily as needed for mild constipation.    Dispense:  72 packet    Refill:  1  . sulfamethoxazole-trimethoprim (BACTRIM DS) 800-160 MG tablet    Sig: Take 1 tablet by mouth 2 (two) times daily for 7 days.    Dispense:  14 tablet    Refill:  0     Follow-up: Return if symptoms worsen or fail to improve.  Scarlette Calico, MD

## 2019-07-06 NOTE — Telephone Encounter (Addendum)
   Patients daughter calling,  Patients daughter is calling , states the [facility] is wanting an answer regarding sulfamethoxazole-trimethoprim (BACTRIM DS) 800-160 MG tablet by 5 today  Pt c/o medication issue:  1. Name of Medication:sulfamethoxazole-trimethoprim (BACTRIM DS) 800-160 MG tablet  2. How are you currently taking this medication (dosage and times per day)? n/a  3. Are you having a reaction (difficulty breathing--STAT)? no  4. What is your medication issue? Can patient take sulfamethoxazole-trimethoprim (BACTRIM DS) 800-160 MG tablet with a Tamsulosin allergy

## 2019-07-06 NOTE — Telephone Encounter (Signed)
Can pt take the sulfa abx? They are concerned about a tamsulosin allergy

## 2019-07-07 NOTE — Telephone Encounter (Signed)
[facility] calling back and states that they received the fax, but they are needing a MD signature on the papers that were faxed.  Walked paperwork back to Dr Ronnald Ramp and had him sign the papers. Faxed back to [facility] at [phone]. Called [facility] to inform that fax was sent with MD's signature on it.

## 2019-07-07 NOTE — Telephone Encounter (Signed)
Called [facility] on behalf of [facility] to see if they had received the fax. Spoke with Juliann Pulse and she states that she was going to check the fax and if she has received it, she will not call back.

## 2019-07-07 NOTE — Telephone Encounter (Signed)
Per PCP - okay to take the antibiotic as prescribed.   Fax sent to [place] at [facility].

## 2019-07-10 ENCOUNTER — Encounter: Payer: Self-pay | Admitting: Psychiatry

## 2019-07-10 ENCOUNTER — Other Ambulatory Visit: Payer: Self-pay

## 2019-07-10 ENCOUNTER — Ambulatory Visit (INDEPENDENT_AMBULATORY_CARE_PROVIDER_SITE_OTHER): Payer: Medicare Other | Admitting: Psychiatry

## 2019-07-10 DIAGNOSIS — F331 Major depressive disorder, recurrent, moderate: Secondary | ICD-10-CM | POA: Diagnosis not present

## 2019-07-10 DIAGNOSIS — F5105 Insomnia due to other mental disorder: Secondary | ICD-10-CM | POA: Diagnosis not present

## 2019-07-10 DIAGNOSIS — G3184 Mild cognitive impairment, so stated: Secondary | ICD-10-CM

## 2019-07-10 DIAGNOSIS — G2581 Restless legs syndrome: Secondary | ICD-10-CM

## 2019-07-10 MED ORDER — FLUOXETINE HCL 10 MG PO CAPS: 10.0000 mg | ORAL_CAPSULE | Freq: Every day | ORAL | 1 refills | Status: AC

## 2019-07-10 NOTE — Progress Notes (Signed)
KLAUS CASTENEDA [id number] [birth year] 79 y.o.  Subjective:   Patient ID:  Henry Adams is a 79 y.o. (DOB 03/22/1940) male.  Chief Complaint:  Chief Complaint  Patient presents with  . Follow-up    Mood and cognition and sleep and meds    Depression        Associated symptoms include decreased concentration.  [name] presents to the office today for follow-up of depression.    First seen Oct 28 started Trintellix 5. Couldn't tolerate it DT dizziness and GI pain.  Resolved off it. Switched to duloxetine 12/6 20 mg daily.  seen January 01, 2019 with daughter present.  We continued recently started duloxetine 20 mg daily and added Ritalin 5 mg twice daily for augmentation and cognitive reasons.D stopped duloxetine 12/20 bc confusion which has gotten better 50% better but still depressed.   seen January 29, 2019 they were encouraged to try Ritalin augmentation for depression at 5 mg twice daily.  February 24, 2019 is daughter [name] called stating he was "more confused, angry, and upset".  The only med change has been the addition of Ritalin and she was encouraged to stop that.  Patient was taking clonazepam 0.5 mg nightly and the option was offered to increase that to 0.75 mg nightly if needed  Last seen February 26, 2019.[name]  Ritalin had been stopped due to agitation. Yesterday was first day off Ritalin and agitation was better, but evening is associated with anxiety and confusion.   It was having sleep issues and the following was changed: Increase to 1 and 1/2 clonazepam of 0.5 mg at night for improving sleep.  Daughter called February 12 noting more cognitive decline.  Stating he needs something to calm him down because of anxiety and agitation.  She was given the following information: At his last visit February 19, 2019 he was having some sleep issues and we increase clonazepam from 0.5 mg nightly to 0.5 mg tablets 1-1/2 nightly.  That is a sedative.  I would prefer not to mix sedatives if  possible.  Therefore use clonazepam 0.5 mg tablets 1/2 to 1 tablet daily during the day in addition to the nighttime dose as needed for agitation.  As of visit 04/09/19 comes with daughter for history. [name]. Dizzy in AM and balance problems with clonazepam 0.75 mg HS. Tolerated clonazepam 0.25 mg added at 3 pm without dizziness and it helped calm him.   "I feel pretty good" today. Clonazepam added at 3 PM did resolve the agitation in the evening so far and is tolerated.  He is not markedly depressed at this time. Moved to independent living apt January 2021. Going to asst living [facility], [place] next week.   Has been difficult with a lot of anxiety over it.  Feels overwhelmed with expectations.  Calling D in middle of night confused. He doesn't remember doing so.  Pt denies being nervous there. Huge changes per D in last 3-4 weeks with greater irritability. [place]. Goes to bed about 930.   Not deeply depressed nor anxious.  Low energy.  Parkinson's interferes with normal activities.  Enjoys family activities.  Not reading much but can.  Watches some TV but less than in the past. Plan: increase Aricept to 10  07/10/19 appointment the following is noted:   Resident [place] and treated OK except the dining staff. He thinks relatively he's doing OK.  Usually sleep is OK. No problems with increase donepezil 10 mg.   D  concerned he's not sleeping a lot at night.  Little TV watching. Some EMA.  Up for the day by 6 AM.   RLS is managed usually but he told D he had a problem last night but overall "tremendous improvement".  Walking 2 times daily. Some depression.  Very interest and can get excited but can't do what he used to do.  Parkinson's interferes with execution of what he wants to do.  Appetite is good.  Conc and memory problems.    No hallucinations lately.   D notes negative interpretation of events.  Past Psychiatric History: History of Lexapro 20 helped awhile but  partially effective for anxiety and depression  Trintellix SE after 4-5 week without benefit.  Duloxetine 20 Ritalin 5 BID Clonazepam helped anxiety and RLS and sleep  History Wellbutrin XL 300,  trazodone for insomnia and didn't help the RLS. History of Seroquel 25 Rx.  Never taken. Melatonin dizzy  Review of Systems:  Review of Systems  HENT: Positive for voice change.   Gastrointestinal: Positive for abdominal distention and constipation. Negative for nausea.  Neurological: Positive for tremors and weakness. Negative for dizziness and light-headedness.  Psychiatric/Behavioral: Positive for confusion, decreased concentration, depression and dysphoric mood.    Medications: I have reviewed the patient's current medications.  Current Outpatient Medications  Medication Sig Dispense Refill  . acetaminophen (TYLENOL) 500 MG tablet 500 mg prn for back, knee, shoulder or head pain. May be given up to every 6 hours. 90 tablet 3  . alfuzosin (UROXATRAL) 10 MG 24 hr tablet Take 1 tablet (10 mg total) by mouth 2 (two) times daily. 10 mg at 7 am and 7 pm daily.    . Carbidopa-Levodopa ER (SINEMET CR) 25-100 MG tablet controlled release 1 tablet at 7 AM/11 AM/3 PM/1.5 tablets at 7 PM/1 at 9pm 540 tablet 1  . clonazePAM (KLONOPIN) 0.5 MG tablet Take 1/2-1 tablet by mouth during the day as needed for anxiety/agitation and take 1.5 tablets at bedtime as needed for sleep/anxiety (Patient taking differently: Take 0.25-0.5 mg by mouth See admin instructions. Take 0.25mg  at 3pm and 0.5mg  at 9pm) 75 tablet 0  . docusate sodium (COLACE) 100 MG capsule Take 1 capsule (100 mg total) by mouth 2 (two) times daily. 1 capsule at 7am and 3pm daily. May be stopped temporarily if patient experiences loose stool. 30 capsule 11  . donepezil (ARICEPT) 10 MG tablet Take 1 tablet (10 mg total) by mouth at bedtime. 90 tablet 1  . fexofenadine (ALLEGRA) 180 MG tablet 180mg  tablet at 11 am. (Patient taking differently: Take  180 mg by mouth daily. 11 am.) 90 tablet 3  . finasteride (PROSCAR) 5 MG tablet 5mg  at 7pm daily (Patient taking differently: Take 5 mg by mouth every evening. 7pm) 90 tablet 3  . FLUoxetine (PROZAC) 10 MG capsule Take 1 capsule (10 mg total) by mouth daily. 30 capsule 1  . lubiprostone (AMITIZA) 24 MCG capsule 24 mcg capsule at 7am and 7pm daily. (Patient taking differently: Take 24 mcg by mouth 2 (two) times daily with a meal. ) 180 capsule 3  . meclizine (ANTIVERT) 12.5 MG tablet Take 1 tablet (12.5 mg total) by mouth 3 (three) times daily as needed. 90 tablet 2  . omeprazole (PRILOSEC) 40 MG capsule TAKE (1) CAPSULE DAILY. (Patient taking differently: Take 40 mg by mouth daily. ) 90 capsule 0  . polyethylene glycol (MIRALAX) 17 g packet Take 17 g by mouth daily as needed for mild constipation. [place]  packet 1  . Probiotic Product ([place]) Take by mouth.    . sulfamethoxazole-trimethoprim (BACTRIM DS) 800-160 MG tablet Take 1 tablet by mouth 2 (two) times daily for 7 days. 14 tablet 0  . vitamin B-12 (CYANOCOBALAMIN) 1000 MCG tablet 1000 mcg tablet at 11 am daily. (Patient taking differently: Take 1,000 mcg by mouth daily. ) 90 tablet 3   No current facility-administered medications for this visit.    Medication Side Effects: None obvious  Allergies:  Allergies  Allergen Reactions  . Iodine     rash  . Oxycodone     Mental status changes & nausea  . Pollen Extract Itching  . Tamiflu [Oseltamivir Phosphate] Other (See Comments)    hallucinations  . Tamsulosin     Other reaction(s): Other (See Comments) Unknown   . Doxazosin Rash    agitation  . Etodolac Rash    unknown  . Tape Rash    Certain "Band-Aids"    Past Medical History:  Diagnosis Date  . Allergy    perennial  . Anxiety   . Arthritis   . Cholelithiasis   . Chronic idiopathic constipation   . CKD (chronic kidney disease)   . Depression   . Diverticulosis   . Enlarged prostate   . Gallstones    . GERD (gastroesophageal reflux disease)   . HLD (hyperlipidemia)   . Hx of cardiovascular stress test    ETT-Myoview 6/14: probable low risk study, inf defect with normal inf wall motion-cannot r/o ischemia; prominent gut uptake adjacent to inf wall on rest images-poss defect is atten, EF 63%  . Hypertension   . Melanoma ([place])   . Parkinsonism ([place])   . Polycystic kidney disease     Family History  Problem Relation Age of Onset  . Hypertension Father   . Cancer Brother        mouth  . Cancer Sister        breast  . Heart disease Brother        heart transplant  . Cancer Daughter        breast  . Stroke Sister        >28  . Skin cancer Brother        squamous cell  . Colon cancer Neg Hx     Social History   Socioeconomic History  . Marital status: Married    Spouse name: Not on file  . Number of children: 2  . Years of education: Not on file  . Highest education level: Not on file  Occupational History  . 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 Smoking status: Never Smoker  . Smokeless tobacco: Never Used  Vaping Use  . Vaping Use: Never used  Substance and Sexual Activity  . Alcohol use: Not Currently    Alcohol/week: 0.0 standard drinks  . Drug use: No  . Sexual activity: Not on file  Other Topics Concern  . Not on file  Social History Narrative  . Not on file   Social Determinants of Health   Financial Resource Strain:   . Difficulty of Paying Living Expenses:   Food Insecurity:   . Worried About Charity fundraiser in the Last Year:   . Arboriculturist in the Last Year:   Transportation Needs:   . Film/video editor (Medical):   [name] Lack of Transportation (Non-Medical):   Physical Activity:   .  Days of Exercise per Week:   . Minutes of Exercise per Session:   Stress:   . Feeling of Stress :   Social Connections:   . Frequency of Communication with Friends and Family:   . Frequency of Social  Gatherings with Friends and Family:   . Attends Religious Services:   . Active Member of Clubs or Organizations:   . Attends Archivist Meetings:   [name] Marital Status:   Intimate Partner Violence:   . Fear of Current or Ex-Partner:   . Emotionally Abused:   [name] Physically Abused:   . Sexually Abused:     Past Medical History, Surgical history, Social history, and Family history were reviewed and updated as appropriate.   Please see review of systems for further details on the patient's review from today.   Objective:   Physical Exam:  There were no vitals taken for this visit.  Physical Exam Constitutional:      Appearance: Normal appearance. He is normal weight.  Neurological:     Mental Status: He is alert and oriented to person, place, and time.     Motor: Weakness and tremor present.     Coordination: Coordination abnormal.     Gait: Gait abnormal.  Psychiatric:        Attention and Perception: He does not perceive auditory or visual hallucinations.        Mood and Affect: Mood is depressed. Mood is not anxious or elated. Affect is blunt. Affect is not tearful.        Speech: Speech is delayed.        Behavior: Behavior is slowed.        Thought Content: Thought content is not paranoid. Thought content does not include homicidal or suicidal ideation.        Cognition and Memory: Memory is impaired. He exhibits impaired recent memory.     Comments: Word finding problems A little more depressed. Insight and judgment fair. Slowed responses significantly. Reduced volume and hard to understand at times. Responses intermittently make no sense.    Loses track of thoughts at time      Lab Review:     Component Value Date/Time   NA 139 05/22/2019 1421   K 3.8 05/22/2019 1421   CL 106 05/22/2019 1421   CO2 25 05/22/2019 1421   GLUCOSE 136 (H) 05/22/2019 1421   GLUCOSE 105 (H) 12/03/2005 0000   BUN 31 (H) 05/22/2019 1421   CREATININE 1.27 (H) 05/22/2019 1421    CALCIUM 8.4 (L) 05/22/2019 1421   PROT 7.0 05/21/2019 1355   ALBUMIN 3.9 05/21/2019 1355   AST 16 05/21/2019 1355   ALT 8 05/21/2019 1355   ALKPHOS 72 05/21/2019 1355   BILITOT 0.9 05/21/2019 1355   GFRNONAA 53 (L) 05/22/2019 1421   GFRAA >60 05/22/2019 1421       Component Value Date/Time   WBC 6.2 05/21/2019 1355   RBC 3.98 (L) 05/21/2019 1355   HGB 12.6 (L) 05/21/2019 1355   HCT 37.7 (L) 05/21/2019 1355   PLT 152.0 05/21/2019 1355   MCV 94.7 05/21/2019 1355   MCH 31.5 06/19/2018 0826   MCHC 33.4 05/21/2019 1355   RDW 14.1 05/21/2019 1355   LYMPHSABS 1.3 05/21/2019 1355   MONOABS 0.7 05/21/2019 1355   EOSABS 0.1 05/21/2019 1355   BASOSABS 0.0 05/21/2019 1355   neuropsych testing Dr. Melvyn Novas 08/2018 [facility]  No results found for: POCLITH, LITHIUM   No results found for: PHENYTOIN, PHENOBARB,  VALPROATE, CBMZ   .res Assessment: Plan:    Caydan was seen today for follow-up.  Diagnoses and all orders for this visit:  Major depressive disorder, recurrent episode, moderate (HCC) -     FLUoxetine (PROZAC) 10 MG capsule; Take 1 capsule (10 mg total) by mouth daily.  Mild cognitive impairment  Restless leg syndrome  Insomnia due to mental condition   Greater than 50% of 30 min face to face time with patient was spent on counseling and coordination of care. We discussed the complex and the relationship between depression, Parkinson's disease including the cognitive complications of it.  These are all interrelated in his case.  He is cognitively slow and obviously physically slow due to the Parkinson's disease. It appears cognition is worse than last visit, more forgetful in STM.   It is difficult to tell how much depression contributes but he does not acknowledge severe depression at this time.  Residual depression.  Not on antidepressant.  Consider more noradrenergic antidepressant like Pristiq. Or easier to use would be fluoxetine bc less withdrawal concerns and history partial  benefit with Lexapro. Fluoxetine 10 mg in AM.  They tried reducing the clonazepam but his confusion did not get better.  Therefore switching BZ probably won't help confusion which is probably Parkinson's dementia at this time.  We discussed the short-term risks associated with benzodiazepines including sedation and increased fall risk among others.  Discussed long-term side effect risk including dependence, potential withdrawal symptoms, and the potential eventual dose-related risk of dementia.  Newer studies refute this association. He did not tolerate clonazepam 0.75 mg nightly because of morning dizziness.      Disc fall risk.  He is benefiting from the clonazepam so no change. Consider alt BZ if gets hangover or more SE.   [name] asks concerns about Parkinson's Dz and hallucinations.  Not a problem currently.  Disc at length.  Continue donepezil.  Likely moved from [facility] to early dementia from PD  Encouraged physical, social, and mental stimulation to help offset some of his symptoms as well.  The treatment plan was discussed with both the patient and his daughter.    FU 2 mos  Lynder Parents, MD, DFAPA    Please see After Visit Summary for patient specific instructions.  Future Appointments  Date Time Provider [place]  11/30/2019 11:15 AM Tat, Eustace Quail, DO [facility] None    No orders of the defined types were placed in this encounter.   -------------------------------

## 2019-07-13 ENCOUNTER — Encounter: Payer: Self-pay | Admitting: Internal Medicine

## 2019-08-03 ENCOUNTER — Encounter: Payer: Self-pay | Admitting: Internal Medicine

## 2019-08-03 ENCOUNTER — Ambulatory Visit (INDEPENDENT_AMBULATORY_CARE_PROVIDER_SITE_OTHER): Payer: Medicare Other | Admitting: Internal Medicine

## 2019-08-03 ENCOUNTER — Other Ambulatory Visit: Payer: Self-pay

## 2019-08-03 ENCOUNTER — Ambulatory Visit (INDEPENDENT_AMBULATORY_CARE_PROVIDER_SITE_OTHER): Payer: Medicare Other

## 2019-08-03 VITALS — BP 124/74 | HR 71 | Temp 99.0°F | Ht 71.0 in | Wt 165.0 lb

## 2019-08-03 DIAGNOSIS — K219 Gastro-esophageal reflux disease without esophagitis: Secondary | ICD-10-CM

## 2019-08-03 DIAGNOSIS — N183 Chronic kidney disease, stage 3 unspecified: Secondary | ICD-10-CM | POA: Diagnosis not present

## 2019-08-03 DIAGNOSIS — R1013 Epigastric pain: Secondary | ICD-10-CM

## 2019-08-03 DIAGNOSIS — D539 Nutritional anemia, unspecified: Secondary | ICD-10-CM | POA: Diagnosis not present

## 2019-08-03 DIAGNOSIS — I10 Essential (primary) hypertension: Secondary | ICD-10-CM | POA: Diagnosis not present

## 2019-08-03 DIAGNOSIS — K5900 Constipation, unspecified: Secondary | ICD-10-CM | POA: Diagnosis not present

## 2019-08-03 MED ORDER — LUBIPROSTONE 24 MCG PO CAPS: ORAL_CAPSULE | ORAL | 1 refills | Status: DC

## 2019-08-03 NOTE — Patient Instructions (Signed)
Anemia  Anemia is a condition in which you do not have enough red blood cells or hemoglobin. Hemoglobin is a substance in red blood cells that carries oxygen. When you do not have enough red blood cells or hemoglobin (are anemic), your body cannot get enough oxygen and your organs may not work properly. As a result, you may feel very tired or have other problems. What are the causes? Common causes of anemia include:  Excessive bleeding. Anemia can be caused by excessive bleeding inside or outside the body, including bleeding from the intestine or from periods in women.  Poor nutrition.  Long-lasting (chronic) kidney, thyroid, and liver disease.  Bone marrow disorders.  Cancer and treatments for cancer.  HIV (human immunodeficiency virus) and AIDS (acquired immunodeficiency syndrome).  Treatments for HIV and AIDS.  Spleen problems.  Blood disorders.  Infections, medicines, and autoimmune disorders that destroy red blood cells. What are the signs or symptoms? Symptoms of this condition include:  Minor weakness.  Dizziness.  Headache.  Feeling heartbeats that are irregular or faster than normal (palpitations).  Shortness of breath, especially with exercise.  Paleness.  Cold sensitivity.  Indigestion.  Nausea.  Difficulty sleeping.  Difficulty concentrating. Symptoms may occur suddenly or develop slowly. If your anemia is mild, you may not have symptoms. How is this diagnosed? This condition is diagnosed based on:  Blood tests.  Your medical history.  A physical exam.  Bone marrow biopsy. Your health care provider may also check your stool (feces) for blood and may do additional testing to look for the cause of your bleeding. You may also have other tests, including:  Imaging tests, such as a CT scan or MRI.  Endoscopy.  Colonoscopy. How is this treated? Treatment for this condition depends on the cause. If you continue to lose a lot of blood, you may  need to be treated at a hospital. Treatment may include:  Taking supplements of iron, vitamin S31, or folic acid.  Taking a hormone medicine (erythropoietin) that can help to stimulate red blood cell growth.  Having a blood transfusion. This may be needed if you lose a lot of blood.  Making changes to your diet.  Having surgery to remove your spleen. Follow these instructions at home:  Take over-the-counter and prescription medicines only as told by your health care provider.  Take supplements only as told by your health care provider.  Follow any diet instructions that you were given.  Keep all follow-up visits as told by your health care provider. This is important. Contact a health care provider if:  You develop new bleeding anywhere in the body. Get help right away if:  You are very weak.  You are short of breath.  You have pain in your abdomen or chest.  You are dizzy or feel faint.  You have trouble concentrating.  You have bloody or black, tarry stools.  You vomit repeatedly or you vomit up blood. Summary  Anemia is a condition in which you do not have enough red blood cells or enough of a substance in your red blood cells that carries oxygen (hemoglobin).  Symptoms may occur suddenly or develop slowly.  If your anemia is mild, you may not have symptoms.  This condition is diagnosed with blood tests as well as a medical history and physical exam. Other tests may be needed.  Treatment for this condition depends on the cause of the anemia. This information is not intended to replace advice given to you by  your health care provider. Make sure you discuss any questions you have with your health care provider. Document Revised: 12/21/2016 Document Reviewed: 02/10/2016 Elsevier Patient Education  [place].

## 2019-08-03 NOTE — Progress Notes (Signed)
Subjective:  Patient ID: Henry Adams, male    DOB: 20-May-1940  Age: 79 y.o. MRN: [id number]  CC: Anemia  This visit occurred during the SARS-CoV-2 public health emergency.  Safety protocols were in place, including screening questions prior to the visit, additional usage of staff PPE, and extensive cleaning of exam room while observing appropriate contact time as indicated for disinfecting solutions.    HPI Henry Adams presents for f/up - He comes in with his daughter today.  He has a history of chronic constipation but for the last few weeks he has had diarrhea about 3-4 times a week.  It sounds like none of his medications for constipation have been adjusted despite the fact that he is having too frequent bowel movements.  He has had mild discomfort in the epigastric region. He denies nausea, vomiting, bright red blood per rectum, or melena. He has a slight decrease in appetite and continues to lose weight.  Outpatient Medications Prior to Visit  Medication Sig Dispense Refill  . acetaminophen (TYLENOL) 500 MG tablet 500 mg prn for back, knee, shoulder or head pain. May be given up to every 6 hours. 90 tablet 3  . alfuzosin (UROXATRAL) 10 MG 24 hr tablet Take 1 tablet (10 mg total) by mouth 2 (two) times daily. 10 mg at 7 am and 7 pm daily.    . Carbidopa-Levodopa ER (SINEMET CR) 25-100 MG tablet controlled release 1 tablet at 7 AM/11 AM/3 PM/1.5 tablets at 7 PM/1 at 9pm 540 tablet 1  . clonazePAM (KLONOPIN) 0.5 MG tablet Take 1/2-1 tablet by mouth during the day as needed for anxiety/agitation and take 1.5 tablets at bedtime as needed for sleep/anxiety (Patient taking differently: Take 0.25-0.5 mg by mouth See admin instructions. Take 0.25mg  at 3pm and 0.5mg  at 9pm) 75 tablet 0  . donepezil (ARICEPT) 10 MG tablet Take 1 tablet (10 mg total) by mouth at bedtime. 90 tablet 1  . fexofenadine (ALLEGRA) 180 MG tablet 180mg  tablet at 11 am. (Patient taking differently: Take 180 mg by mouth daily.  11 am.) 90 tablet 3  . finasteride (PROSCAR) 5 MG tablet 5mg  at 7pm daily (Patient taking differently: Take 5 mg by mouth every evening. 7pm) 90 tablet 3  . FLUoxetine (PROZAC) 10 MG capsule Take 1 capsule (10 mg total) by mouth daily. 30 capsule 1  . meclizine (ANTIVERT) 12.5 MG tablet Take 1 tablet (12.5 mg total) by mouth 3 (three) times daily as needed. 90 tablet 2  . omeprazole (PRILOSEC) 40 MG capsule TAKE (1) CAPSULE DAILY. (Patient taking differently: Take 40 mg by mouth daily. ) 90 capsule 0  . Probiotic Product ([place]) Take by mouth.    . vitamin B-12 (CYANOCOBALAMIN) 1000 MCG tablet 1000 mcg tablet at 11 am daily. (Patient taking differently: Take 1,000 mcg by mouth daily. ) 90 tablet 3  . docusate sodium (COLACE) 100 MG capsule Take 1 capsule (100 mg total) by mouth 2 (two) times daily. 1 capsule at 7am and 3pm daily. May be stopped temporarily if patient experiences loose stool. 30 capsule 11  . lubiprostone (AMITIZA) 24 MCG capsule 24 mcg capsule at 7am and 7pm daily. (Patient taking differently: Take 24 mcg by mouth 2 (two) times daily with a meal. ) 180 capsule 3  . polyethylene glycol (MIRALAX) 17 g packet Take 17 g by mouth daily as needed for mild constipation. 72 packet 1   No facility-administered medications prior to visit.  ROS Review of Systems  Constitutional: Positive for appetite change and unexpected weight change. Negative for chills, diaphoresis, fatigue and fever.  HENT: Negative.  Negative for trouble swallowing.   Eyes: Negative.   Respiratory: Negative for cough, chest tightness and wheezing.   Cardiovascular: Negative for chest pain, palpitations and leg swelling.  Gastrointestinal: Positive for diarrhea. Negative for abdominal distention, anal bleeding, blood in stool, constipation, nausea and vomiting.  Endocrine: Negative.   Genitourinary: Negative.  Negative for difficulty urinating.  Musculoskeletal: Negative.  Negative for  arthralgias, back pain and myalgias.  Skin: Negative.  Negative for color change and rash.  Neurological: Negative.  Negative for dizziness, weakness and light-headedness.  Hematological: Negative for adenopathy. Does not bruise/bleed easily.  Psychiatric/Behavioral: Positive for decreased concentration and dysphoric mood. Negative for sleep disturbance. The patient is not nervous/anxious.     Objective:  BP 124/74 (BP Location: Left Arm, Patient Position: Sitting, Cuff Size: Normal)   Pulse 71   Temp 99 F (37.2 C) (Oral)   Ht 5\' 11"  (1.803 m)   Wt 165 lb (74.8 kg)   SpO2 95%   BMI 23.01 kg/m   BP Readings from Last 3 Encounters:  08/03/19 124/74  07/06/19 130/70  05/29/19 130/77    Wt Readings from Last 3 Encounters:  08/03/19 165 lb (74.8 kg)  07/06/19 167 lb 6 oz (75.9 kg)  05/29/19 164 lb (74.4 kg)    Physical Exam Vitals reviewed.  Constitutional:      Appearance: Normal appearance.  HENT:     Nose: Nose normal.     Mouth/Throat:     Mouth: Mucous membranes are moist.  Eyes:     General: No scleral icterus.    Conjunctiva/sclera: Conjunctivae normal.  Cardiovascular:     Rate and Rhythm: Normal rate and regular rhythm.     Heart sounds: No murmur heard.   Pulmonary:     Effort: Pulmonary effort is normal.     Breath sounds: No stridor. No wheezing, rhonchi or rales.  Abdominal:     General: There is distension.     Palpations: There is no mass.     Tenderness: There is no guarding or rebound.  Musculoskeletal:        General: Normal range of motion.     Cervical back: Neck supple.     Right lower leg: No edema.     Left lower leg: No edema.  Lymphadenopathy:     Cervical: No cervical adenopathy.  Skin:    General: Skin is warm and dry.     Coloration: Skin is not pale.  Neurological:     General: No focal deficit present.     Mental Status: He is alert and oriented to person, place, and time. Mental status is at baseline.  Psychiatric:         Behavior: Behavior normal.     Lab Results  Component Value Date   WBC 5.5 08/03/2019   HGB 12.3 (L) 08/03/2019   HCT 36.6 (L) 08/03/2019   PLT 140 08/03/2019   GLUCOSE 104 (H) 08/03/2019   CHOL 179 11/14/2016   TRIG 73.0 11/14/2016   HDL 68.50 11/14/2016   LDLDIRECT 131.7 07/02/2011   LDLCALC 96 11/14/2016   ALT 8 05/21/2019   AST 16 05/21/2019   NA 138 08/03/2019   K 3.8 08/03/2019   CL 104 08/03/2019   CREATININE 1.39 (H) 08/03/2019   BUN 22 08/03/2019   CO2 27 08/03/2019   TSH 1.36 06/25/2018  PSA 2.28 08/11/2015   INR 1.1 (H) 08/01/2012   HGBA1C 5.8 12/29/2018    CT ABDOMEN PELVIS WO CONTRAST  Result Date: 05/22/2019 CLINICAL DATA:  Epigastric and left chest pain, mildly elevated lipase (69), elevated D-dimer EXAM: CT CHEST, ABDOMEN AND PELVIS WITHOUT CONTRAST TECHNIQUE: Multidetector CT imaging of the chest, abdomen and pelvis was performed following the standard protocol without IV contrast. COMPARISON:  CT abdomen pelvis 04/20/2019, chest radiograph Jun 16, 2018, CT chest 09/23/2000 (report only, images unavailable) FINDINGS: CT CHEST FINDINGS Cardiovascular: The aortic root is suboptimally assessed given cardiac pulsation artifact. Gross preservation of the sino-tubular junction. Mild dilatation of the ascending thoracic aorta to 4.4 cm with return to a normal aortic caliber at the level of the distal arch measuring 2.6 cm. Mild atherosclerotic calcification of the thoracic aorta. No abnormal hyperdense mural thickening or plaque displacement to suggest intramural hematoma. No periaortic stranding or hemorrhage. Normal 3 vessel branching of the aortic arch with minimal calcification of the proximal great vessels. Central pulmonary arteries are normal caliber. Normal heart size. Trace pericardial fluid likely within physiologic normal and unchanged from comparison CT. Extensive three-vessel coronary artery calcifications are noted. Luminal evaluation of the vasculature  precluded in the absence of contrast media. Mediastinum/Nodes: No mediastinal fluid or gas. Normal thyroid gland and thoracic inlet. No acute abnormality of the trachea or esophagus. No worrisome mediastinal or axillary adenopathy. Hilar nodal evaluation is limited in the absence of intravenous contrast media. Lungs/Pleura: Bandlike area of opacity in the right lung base compatible with subsegmental atelectasis and/or scarring. More dependent atelectasis seen posteriorly in the lung bases. No consolidation, features of edema, pneumothorax, or effusion. No suspicious pulmonary nodules or masses. Musculoskeletal: Multilevel degenerative changes are present in the imaged portions of the spine. No acute osseous abnormality or suspicious osseous lesion. No chest wall mass or suspicious soft tissue lesions identified. Paucity of subcutaneous fat. CT ABDOMEN PELVIS FINDINGS Hepatobiliary: Redemonstration of the innumerable hypoattenuating foci throughout the liver, many of which are too small to characterize but likely reflect benign hepatic cysts. Fluid attenuation cyst in the left lobe again measures up to 3.2 cm (2/54). No worrisome liver lesion visible within the unenhanced liver. Gallbladder is largely decompressed at the time of exam. No frank biliary ductal dilatation or visible calcified gallstones. Pancreas: Pancreas is anteriorly displaced by the multi-cystic renal parenchyma. No visible pancreatic lesions. No peripancreatic inflammation or evidence of ductal dilatation. Spleen: Spleen is displaced anteriorly. Otherwise unremarkable and normal size without focal splenic lesion. Adrenals/Urinary Tract: Adrenal glands are difficult to discern from the multi-cystic renal parenchyma. There are numerous bilateral renal cysts of varying size and complexity including several intermediate attenuation foci in the upper pole right kidney (2/56, 62) and lower pole left kidney (2/96). Multiple areas of ill-defined  calcification seen within the multi cystic renal parenchyma which could reflect septal calcification and or nonobstructing calculi. Difficult to discern the normal renal parenchyma and collecting system. No visible calcifications along the expected course of either ureter nor visible lodged within the otherwise normal urinary bladder. Some mild indentation of the posterior bladder by the enlarged prostate. Stomach/Bowel: Technically difficult assessment of the bowel due to a paucity of intraperitoneal fat and the large multi cystic kidneys. Distal esophagus and stomach are free of acute abnormality. Duodenal sweep crosses the midline abdomen. Much of the large and small bowel is displaced anteriorly. No gross bowel wall thickening, dilatation, or evidence of obstruction. The appendix is not identified. Vascular/Lymphatic: Atherosclerotic plaque within  the normal caliber aorta. Evaluation of the lymph nodes is limited given a paucity of intraperitoneal fat and lack of intravenous contrast material. No gross adenopathy is appreciable. Reproductive: Coarse eccentric calcification of the mildly enlarged prostate. No concerning abnormalities of the prostate or seminal vesicles. Benign calcifications of the penile corpora. Other: No abdominopelvic free fluid or free gas. No bowel containing hernias. Paucity of intraperitoneal and subcutaneous fat. Musculoskeletal: Multilevel degenerative changes are present in the imaged portions of the spine. Mild grade 1 anterolisthesis L4 on L5 without associated pars defect. No acute osseous abnormality or suspicious osseous lesion. Additional mild degenerative changes in the hips and SI joints. IMPRESSION: 1. No definite acute abnormality seen in the chest, abdomen or pelvis. Specifically no convincing CT features of acute pancreatitis. Elevation of the D-dimer is nonspecific and exclusion of a pulmonary embolus is precluded on this exam given the absence of intravenous contrast  media. 2. Extensive multicystic appearance of the liver and kidneys with varying degrees of size and complexity within the renal cysts most compatible with autosomal dominant polycystic kidney disease. Appearance of several complex and hyperdense cyst is grossly unchanged from prior. 3. Mild dilatation of the ascending thoracic aorta to 4.4 cm with return to a normal aortic caliber at the level of the distal arch measuring 2.6 cm. Recommend annual imaging followup by CTA or MRA. This recommendation follows 2010 ACCF/AHA/AATS/ACR/ASA/SCA/SCAI/SIR/STS/SVM Guidelines for the Diagnosis and Management of Patients with Thoracic Aortic Disease. Circulation. 2010; 121: Q676-P950. Aortic aneurysm NOS (ICD10-I71.9) 4. Coronary artery calcifications are present. Please note that the presence of coronary artery calcium documents the presence of coronary artery disease, the severity of this disease and any potential stenosis cannot be assessed on this non-gated CT examination. Assessment for potential risk factor modification, dietary therapy or pharmacologic therapy may be warranted. 5. Aortic Atherosclerosis (ICD10-I70.0). 6. Mild grade 1 anterolisthesis L4 on L5 without associated pars defect. 7. Mild prostatomegaly. 8. Paucity of subcutaneous and intraperitoneal fat, correlate with nutritional status. Electronically Signed   By: Lovena Le M.D.   On: 05/22/2019 18:29   CT Chest Wo Contrast  Result Date: 05/22/2019 CLINICAL DATA:  Epigastric and left chest pain, mildly elevated lipase (69), elevated D-dimer EXAM: CT CHEST, ABDOMEN AND PELVIS WITHOUT CONTRAST TECHNIQUE: Multidetector CT imaging of the chest, abdomen and pelvis was performed following the standard protocol without IV contrast. COMPARISON:  CT abdomen pelvis 04/20/2019, chest radiograph Jun 16, 2018, CT chest 09/23/2000 (report only, images unavailable) FINDINGS: CT CHEST FINDINGS Cardiovascular: The aortic root is suboptimally assessed given cardiac  pulsation artifact. Gross preservation of the sino-tubular junction. Mild dilatation of the ascending thoracic aorta to 4.4 cm with return to a normal aortic caliber at the level of the distal arch measuring 2.6 cm. Mild atherosclerotic calcification of the thoracic aorta. No abnormal hyperdense mural thickening or plaque displacement to suggest intramural hematoma. No periaortic stranding or hemorrhage. Normal 3 vessel branching of the aortic arch with minimal calcification of the proximal great vessels. Central pulmonary arteries are normal caliber. Normal heart size. Trace pericardial fluid likely within physiologic normal and unchanged from comparison CT. Extensive three-vessel coronary artery calcifications are noted. Luminal evaluation of the vasculature precluded in the absence of contrast media. Mediastinum/Nodes: No mediastinal fluid or gas. Normal thyroid gland and thoracic inlet. No acute abnormality of the trachea or esophagus. No worrisome mediastinal or axillary adenopathy. Hilar nodal evaluation is limited in the absence of intravenous contrast media. Lungs/Pleura: Bandlike area of opacity in the right  lung base compatible with subsegmental atelectasis and/or scarring. More dependent atelectasis seen posteriorly in the lung bases. No consolidation, features of edema, pneumothorax, or effusion. No suspicious pulmonary nodules or masses. Musculoskeletal: Multilevel degenerative changes are present in the imaged portions of the spine. No acute osseous abnormality or suspicious osseous lesion. No chest wall mass or suspicious soft tissue lesions identified. Paucity of subcutaneous fat. CT ABDOMEN PELVIS FINDINGS Hepatobiliary: Redemonstration of the innumerable hypoattenuating foci throughout the liver, many of which are too small to characterize but likely reflect benign hepatic cysts. Fluid attenuation cyst in the left lobe again measures up to 3.2 cm (2/54). No worrisome liver lesion visible within the  unenhanced liver. Gallbladder is largely decompressed at the time of exam. No frank biliary ductal dilatation or visible calcified gallstones. Pancreas: Pancreas is anteriorly displaced by the multi-cystic renal parenchyma. No visible pancreatic lesions. No peripancreatic inflammation or evidence of ductal dilatation. Spleen: Spleen is displaced anteriorly. Otherwise unremarkable and normal size without focal splenic lesion. Adrenals/Urinary Tract: Adrenal glands are difficult to discern from the multi-cystic renal parenchyma. There are numerous bilateral renal cysts of varying size and complexity including several intermediate attenuation foci in the upper pole right kidney (2/56, 62) and lower pole left kidney (2/96). Multiple areas of ill-defined calcification seen within the multi cystic renal parenchyma which could reflect septal calcification and or nonobstructing calculi. Difficult to discern the normal renal parenchyma and collecting system. No visible calcifications along the expected course of either ureter nor visible lodged within the otherwise normal urinary bladder. Some mild indentation of the posterior bladder by the enlarged prostate. Stomach/Bowel: Technically difficult assessment of the bowel due to a paucity of intraperitoneal fat and the large multi cystic kidneys. Distal esophagus and stomach are free of acute abnormality. Duodenal sweep crosses the midline abdomen. Much of the large and small bowel is displaced anteriorly. No gross bowel wall thickening, dilatation, or evidence of obstruction. The appendix is not identified. Vascular/Lymphatic: Atherosclerotic plaque within the normal caliber aorta. Evaluation of the lymph nodes is limited given a paucity of intraperitoneal fat and lack of intravenous contrast material. No gross adenopathy is appreciable. Reproductive: Coarse eccentric calcification of the mildly enlarged prostate. No concerning abnormalities of the prostate or seminal  vesicles. Benign calcifications of the penile corpora. Other: No abdominopelvic free fluid or free gas. No bowel containing hernias. Paucity of intraperitoneal and subcutaneous fat. Musculoskeletal: Multilevel degenerative changes are present in the imaged portions of the spine. Mild grade 1 anterolisthesis L4 on L5 without associated pars defect. No acute osseous abnormality or suspicious osseous lesion. Additional mild degenerative changes in the hips and SI joints. IMPRESSION: 1. No definite acute abnormality seen in the chest, abdomen or pelvis. Specifically no convincing CT features of acute pancreatitis. Elevation of the D-dimer is nonspecific and exclusion of a pulmonary embolus is precluded on this exam given the absence of intravenous contrast media. 2. Extensive multicystic appearance of the liver and kidneys with varying degrees of size and complexity within the renal cysts most compatible with autosomal dominant polycystic kidney disease. Appearance of several complex and hyperdense cyst is grossly unchanged from prior. 3. Mild dilatation of the ascending thoracic aorta to 4.4 cm with return to a normal aortic caliber at the level of the distal arch measuring 2.6 cm. Recommend annual imaging followup by CTA or MRA. This recommendation follows 2010 ACCF/AHA/AATS/ACR/ASA/SCA/SCAI/SIR/STS/SVM Guidelines for the Diagnosis and Management of Patients with Thoracic Aortic Disease. Circulation. 2010; 121: L875-I433. Aortic aneurysm NOS (ICD10-I71.9) 4.  Coronary artery calcifications are present. Please note that the presence of coronary artery calcium documents the presence of coronary artery disease, the severity of this disease and any potential stenosis cannot be assessed on this non-gated CT examination. Assessment for potential risk factor modification, dietary therapy or pharmacologic therapy may be warranted. 5. Aortic Atherosclerosis (ICD10-I70.0). 6. Mild grade 1 anterolisthesis L4 on L5 without  associated pars defect. 7. Mild prostatomegaly. 8. Paucity of subcutaneous and intraperitoneal fat, correlate with nutritional status. Electronically Signed   By: Lovena Le M.D.   On: 05/22/2019 18:29   NM PULMONARY VENT AND PERF (V/Q Scan)  Result Date: 05/22/2019 CLINICAL DATA:  Epigastric and left-sided chest pain EXAM: NUCLEAR MEDICINE VENTILATION - PERFUSION LUNG SCAN TECHNIQUE: Ventilation images were obtained in multiple projections using inhaled aerosol Tc-44m DTPA. Perfusion images were obtained in multiple projections after intravenous injection of Tc-49m MAA. RADIOPHARMACEUTICALS:  40.9 mCi of Tc-75m DTPA aerosol inhalation and 1.6 mCi Tc74m MAA IV COMPARISON:  CT same day FINDINGS: Ventilation: There is a small ventilation defects seen within the right lower lobe and heights is artery to of a there neck and height is Dr. Lorenso Courier to the head high on the abdomen CT psychosis from same day cardiac CT or CT at yet number on the back left lower lobe. No other ventilation defects are seen. Perfusion: There is also a small perfusion defect seen within the right lower lobe and left lower lobe. No large perfusion defects are seen. IMPRESSION: Two matched small perfusion defects within the right lower lobe and left lower lobe with very low probability of pulmonary embolism based on PIOPED criteria. Electronically Signed   By: Prudencio Pair M.D.   On: 05/22/2019 22:01    No results found.  Assessment & Plan:   Zuriel was seen today for anemia.  Diagnoses and all orders for this visit:  Deficiency anemia- His H&H are stable.  Screening for vitamin deficiencies is unremarkable.  This is consistent with the anemia of chronic disease. -     CBC with Differential/Platelet; Future -     Vitamin B12; Future -     IBC panel; Future -     Folate; Future -     Cancel: Ferritin; Future -     Vitamin B1; Future -     Reticulocytes; Future -     Reticulocytes -     Vitamin B1 -     Folate -     Cancel:  IBC panel -     Vitamin B12 -     CBC with Differential/Platelet -     Iron, TIBC and Ferritin Panel; Future -     Iron, TIBC and Ferritin Panel  Essential hypertension- His blood pressure is adequately well controlled.  CRI (chronic renal insufficiency), stage 3 (moderate)- His renal function is stable. -     Basic metabolic panel; Future -     Basic metabolic panel  Constipation, unspecified constipation type- He complains of too frequent bowel movements so I recommended that the dose of Amitiza be decreased to once a day. -     lubiprostone (AMITIZA) 24 MCG capsule; 24 mcg capsule at 7am and 7pm daily.  Gastroesophageal reflux disease without esophagitis- His symptoms are adequately well controlled.  Epigastric pain- Based on a paucity of other symptoms, his exam, labs, and x-ray I do not see any acute abdominal process here.  I think this is likely consistent with his history of irritable bowel syndrome. -  DG ABD ACUTE 2+V W 1V CHEST; Future  Other orders -     Cancel: Ferritin -     Ferritin   I have discontinued Josie Saunders. Namba's docusate sodium and polyethylene glycol. I am also having him maintain his fexofenadine, finasteride, acetaminophen, vitamin B-12, clonazePAM, omeprazole, alfuzosin, Probiotic Product ([place]), donepezil, Carbidopa-Levodopa ER, meclizine, FLUoxetine, and lubiprostone.  Meds ordered this encounter  Medications  . lubiprostone (AMITIZA) 24 MCG capsule    Sig: 24 mcg capsule at 7am and 7pm daily.    Dispense:  90 capsule    Refill:  1   I spent 60 minutes in preparing to see the patient by review of recent labs, imaging and procedures, obtaining and reviewing separately obtained history, communicating with the patient and family or caregiver, ordering medications, tests or procedures, and documenting clinical information in the EHR including the differential Dx, treatment, and any further evaluation and other management of 1.  Deficiency anemia 2. Essential hypertension 3. CRI (chronic renal insufficiency), stage 3 (moderate) 4. Constipation, unspecified constipation type 5. Gastroesophageal reflux disease without esophagitis 6. Epigastric pain      Follow-up: Return in about 4 months (around 12/04/2019).  Scarlette Calico, MD

## 2019-08-04 LAB — IRON,TIBC AND FERRITIN PANEL
%SAT: 31 % (calc) (ref 20–48)
Ferritin: 39 ng/mL (ref 24–380)
Iron: 84 ug/dL (ref 50–180)
TIBC: 270 mcg/dL (calc) (ref 250–425)

## 2019-08-06 ENCOUNTER — Telehealth: Payer: Self-pay | Admitting: Psychiatry

## 2019-08-06 ENCOUNTER — Encounter: Payer: Self-pay | Admitting: Internal Medicine

## 2019-08-06 NOTE — Telephone Encounter (Signed)
5 weeks is not long enough in this context to get the full benefit from Prozac and Prozac rarely causes diarrhea but it's not impossible.  She can stop and see if the diarrhea resolves.  No taper needed.

## 2019-08-06 NOTE — Telephone Encounter (Signed)
Left detailed message on [name]'s person voicemail on Dr. Casimiro Needle recommendation. Advised to call back with further questions or concerns.

## 2019-08-06 NOTE — Telephone Encounter (Signed)
[name] called back and she agreed for the trial of stopping it temporarily to see if the diarrhea subsides. Asked if I could fax over an order to discontinue prozac 10 mg to his facility [facility] @ [place], [place]) [phone], [phone]  Called facility and they gave me their fax # will send order.

## 2019-08-06 NOTE — Telephone Encounter (Signed)
Daughter called and said that her dad Henry Adams is having diariea every day and the daughter wants to know is this from then prozac that he has just started.  Please call the daughter to see what needs to be done . She wants to know if she needs to stop the mediciation. Her number is [phone]

## 2019-08-07 ENCOUNTER — Telehealth: Payer: Self-pay

## 2019-08-07 ENCOUNTER — Encounter: Payer: Self-pay | Admitting: Internal Medicine

## 2019-08-07 LAB — CBC WITH DIFFERENTIAL/PLATELET
Absolute Monocytes: 627 cells/uL (ref 200–950)
Basophils Absolute: 61 cells/uL (ref 0–200)
Basophils Relative: 1.1 %
Eosinophils Absolute: 138 cells/uL (ref 15–500)
Eosinophils Relative: 2.5 %
HCT: 36.6 % — ABNORMAL LOW (ref 38.5–50.0)
Hemoglobin: 12.3 g/dL — ABNORMAL LOW (ref 13.2–17.1)
Lymphs Abs: 1375 cells/uL (ref 850–3900)
MCH: 32.3 pg (ref 27.0–33.0)
MCHC: 33.6 g/dL (ref 32.0–36.0)
MCV: 96.1 fL (ref 80.0–100.0)
MPV: 11.9 fL (ref 7.5–12.5)
Monocytes Relative: 11.4 %
Neutro Abs: 3300 cells/uL (ref 1500–7800)
Neutrophils Relative %: 60 %
Platelets: 140 10*3/uL (ref 140–400)
RBC: 3.81 10*6/uL — ABNORMAL LOW (ref 4.20–5.80)
RDW: 12.5 % (ref 11.0–15.0)
Total Lymphocyte: 25 %
WBC: 5.5 10*3/uL (ref 3.8–10.8)

## 2019-08-07 LAB — BASIC METABOLIC PANEL
BUN/Creatinine Ratio: 16 (calc) (ref 6–22)
BUN: 22 mg/dL (ref 7–25)
CO2: 27 mmol/L (ref 20–32)
Calcium: 8.8 mg/dL (ref 8.6–10.3)
Chloride: 104 mmol/L (ref 98–110)
Creat: 1.39 mg/dL — ABNORMAL HIGH (ref 0.70–1.11)
Glucose, Bld: 104 mg/dL — ABNORMAL HIGH (ref 65–99)
Potassium: 3.8 mmol/L (ref 3.5–5.3)
Sodium: 138 mmol/L (ref 135–146)

## 2019-08-07 LAB — RETICULOCYTES
ABS Retic: 34290 cells/uL (ref 25000–9000)
Retic Ct Pct: 0.9 %

## 2019-08-07 LAB — FOLATE: Folate: 8 ng/mL

## 2019-08-07 LAB — FERRITIN: Ferritin: 40 ng/mL (ref 24–380)

## 2019-08-07 LAB — VITAMIN B1: Vitamin B1 (Thiamine): 11 nmol/L (ref 8–30)

## 2019-08-07 LAB — VITAMIN B12: Vitamin B-12: 1285 pg/mL — ABNORMAL HIGH (ref 200–1100)

## 2019-08-07 NOTE — Telephone Encounter (Signed)
Henry Adams informed pt dtr that Dr. Ronnald Ramp was not in the office and that it is not appropriate to ask another provider for orders without a visit.

## 2019-08-07 NOTE — Telephone Encounter (Signed)
New message    Last office visit on 7.12.21  Amy calling asking can they drop off a urine sample feels like he might have a UTI.   Advise Amy an appt is need since Dr.Jones is off today. Amy declined due to just being seen in the office.    Aware a message will be sent around to the [facility].

## 2019-08-18 ENCOUNTER — Other Ambulatory Visit: Payer: Self-pay

## 2019-08-18 ENCOUNTER — Ambulatory Visit (INDEPENDENT_AMBULATORY_CARE_PROVIDER_SITE_OTHER): Payer: Medicare Other | Admitting: Internal Medicine

## 2019-08-18 ENCOUNTER — Encounter: Payer: Self-pay | Admitting: Internal Medicine

## 2019-08-18 VITALS — BP 148/94 | HR 65 | Temp 98.2°F | Resp 16 | Ht 71.0 in | Wt 158.5 lb

## 2019-08-18 DIAGNOSIS — R300 Dysuria: Secondary | ICD-10-CM | POA: Diagnosis not present

## 2019-08-18 DIAGNOSIS — K582 Mixed irritable bowel syndrome: Secondary | ICD-10-CM | POA: Insufficient documentation

## 2019-08-18 LAB — POC URINALSYSI DIPSTICK (AUTOMATED)
Bilirubin, UA: NEGATIVE
Blood, UA: NEGATIVE
Glucose, UA: NEGATIVE
Ketones, UA: NEGATIVE
Leukocytes, UA: NEGATIVE
Nitrite, UA: NEGATIVE
Protein, UA: POSITIVE — AB
Spec Grav, UA: 1.025 (ref 1.010–1.025)
Urobilinogen, UA: 0.2 E.U./dL
pH, UA: 6 (ref 5.0–8.0)

## 2019-08-18 MED ORDER — GLYCOPYRROLATE 1 MG PO TABS: 1.0000 mg | ORAL_TABLET | Freq: Three times a day (TID) | ORAL | 3 refills | Status: DC | PRN

## 2019-08-18 NOTE — Patient Instructions (Signed)

## 2019-08-19 NOTE — Progress Notes (Signed)
Subjective:  Patient ID: Henry Adams, male    DOB: August 31, 1940  Age: 79 y.o. MRN: [id number]  CC: Abdominal Pain  This visit occurred during the SARS-CoV-2 public health emergency.  Safety protocols were in place, including screening questions prior to the visit, additional usage of staff PPE, and extensive cleaning of exam room while observing appropriate contact time as indicated for disinfecting solutions.    HPI Henry Adams presents for f/up - He recently stopped taking Amitiza because he was experiencing frequent and loose bowel movements.  Amitiza has not been restarted but he still complains of abdominal symptoms with weight loss, poor appetite, worsening confusion, and the complaint of pressure in his stomach.  There has been no nausea and vomiting.  Over the last week his bowel movements have been in the normal range.  He denies dysuria, hematuria, urgency, or frequency.  Outpatient Medications Prior to Visit  Medication Sig Dispense Refill  . acetaminophen (TYLENOL) 500 MG tablet 500 mg prn for back, knee, shoulder or head pain. May be given up to every 6 hours. 90 tablet 3  . alfuzosin (UROXATRAL) 10 MG 24 hr tablet Take 1 tablet (10 mg total) by mouth 2 (two) times daily. 10 mg at 7 am and 7 pm daily.    . Carbidopa-Levodopa ER (SINEMET CR) 25-100 MG tablet controlled release 1 tablet at 7 AM/11 AM/3 PM/1.5 tablets at 7 PM/1 at 9pm 540 tablet 1  . clonazePAM (KLONOPIN) 0.5 MG tablet Take 1/2-1 tablet by mouth during the day as needed for anxiety/agitation and take 1.5 tablets at bedtime as needed for sleep/anxiety (Patient taking differently: Take 0.25-0.5 mg by mouth See admin instructions. Take 0.25mg  at 3pm and 0.5mg  at 9pm) 75 tablet 0  . donepezil (ARICEPT) 10 MG tablet Take 1 tablet (10 mg total) by mouth at bedtime. 90 tablet 1  . fexofenadine (ALLEGRA) 180 MG tablet 180mg  tablet at 11 am. (Patient taking differently: Take 180 mg by mouth daily. 11 am.) 90 tablet 3  .  finasteride (PROSCAR) 5 MG tablet 5mg  at 7pm daily (Patient taking differently: Take 5 mg by mouth every evening. 7pm) 90 tablet 3  . meclizine (ANTIVERT) 12.5 MG tablet Take 1 tablet (12.5 mg total) by mouth 3 (three) times daily as needed. 90 tablet 2  . omeprazole (PRILOSEC) 40 MG capsule TAKE (1) CAPSULE DAILY. (Patient taking differently: Take 40 mg by mouth daily. ) 90 capsule 0  . Probiotic Product ([place]) Take by mouth.    . vitamin B-12 (CYANOCOBALAMIN) 1000 MCG tablet 1000 mcg tablet at 11 am daily. (Patient taking differently: Take 1,000 mcg by mouth daily. ) 90 tablet 3  . FLUoxetine (PROZAC) 10 MG capsule Take 1 capsule (10 mg total) by mouth daily. (Patient not taking: Reported on 08/18/2019) 30 capsule 1  . lubiprostone (AMITIZA) 24 MCG capsule 24 mcg capsule at 7am and 7pm daily. 90 capsule 1   No facility-administered medications prior to visit.    ROS Review of Systems  Constitutional: Positive for appetite change and unexpected weight change. Negative for diaphoresis and fatigue.  HENT: Negative.  Negative for trouble swallowing.   Eyes: Negative for visual disturbance.  Respiratory: Negative for cough, chest tightness, shortness of breath and wheezing.   Cardiovascular: Negative for chest pain, palpitations and leg swelling.  Gastrointestinal: Positive for abdominal distention and abdominal pain. Negative for constipation, diarrhea, nausea and vomiting.  Endocrine: Negative.   Genitourinary: Negative for difficulty urinating, dysuria, hematuria  and urgency.  Musculoskeletal: Negative.   Skin: Negative.   Neurological: Negative.  Negative for dizziness, weakness, light-headedness and headaches.  Hematological: Negative for adenopathy. Does not bruise/bleed easily.  Psychiatric/Behavioral: Positive for confusion and decreased concentration.    Objective:  BP (!) 148/94 (BP Location: Left Arm, Patient Position: Sitting, Cuff Size: Normal)   Pulse 65    Temp 98.2 F (36.8 C) (Oral)   Resp 16   Ht 5\' 11"  (1.803 m)   Wt 158 lb 8 oz (71.9 kg)   SpO2 96%   BMI 22.11 kg/m   BP Readings from Last 3 Encounters:  08/18/19 (!) 148/94  08/03/19 124/74  07/06/19 130/70    Wt Readings from Last 3 Encounters:  08/18/19 158 lb 8 oz (71.9 kg)  08/03/19 165 lb (74.8 kg)  07/06/19 167 lb 6 oz (75.9 kg)    Physical Exam Vitals reviewed.  Constitutional:      General: He is not in acute distress.    Appearance: He is well-developed. He is ill-appearing. He is not toxic-appearing or diaphoretic.  HENT:     Nose: Nose normal.     Mouth/Throat:     Mouth: Mucous membranes are moist.  Eyes:     General: No scleral icterus.    Conjunctiva/sclera: Conjunctivae normal.  Cardiovascular:     Rate and Rhythm: Normal rate and regular rhythm.     Heart sounds: No murmur heard.   Pulmonary:     Effort: Pulmonary effort is normal.     Breath sounds: No stridor. No wheezing, rhonchi or rales.  Abdominal:     General: Abdomen is protuberant. Bowel sounds are normal. There is no distension.     Palpations: Abdomen is soft. There is no hepatomegaly, splenomegaly or mass.     Tenderness: There is no abdominal tenderness.     Hernia: No hernia is present.  Musculoskeletal:        General: Normal range of motion.     Cervical back: Neck supple.     Right lower leg: No edema.     Left lower leg: No edema.  Lymphadenopathy:     Cervical: No cervical adenopathy.  Skin:    General: Skin is warm and dry.     Coloration: Skin is not pale.  Neurological:     Mental Status: He is alert. Mental status is at baseline.     Lab Results  Component Value Date   WBC 5.5 08/03/2019   HGB 12.3 (L) 08/03/2019   HCT 36.6 (L) 08/03/2019   PLT 140 08/03/2019   GLUCOSE 104 (H) 08/03/2019   CHOL 179 11/14/2016   TRIG 73.0 11/14/2016   HDL 68.50 11/14/2016   LDLDIRECT 131.7 07/02/2011   LDLCALC 96 11/14/2016   ALT 8 05/21/2019   AST 16 05/21/2019   NA 138  08/03/2019   K 3.8 08/03/2019   CL 104 08/03/2019   CREATININE 1.39 (H) 08/03/2019   BUN 22 08/03/2019   CO2 27 08/03/2019   TSH 1.36 06/25/2018   PSA 2.28 08/11/2015   INR 1.1 (H) 08/01/2012   HGBA1C 5.8 12/29/2018    CT ABDOMEN PELVIS WO CONTRAST  Result Date: 05/22/2019 CLINICAL DATA:  Epigastric and left chest pain, mildly elevated lipase (69), elevated D-dimer EXAM: CT CHEST, ABDOMEN AND PELVIS WITHOUT CONTRAST TECHNIQUE: Multidetector CT imaging of the chest, abdomen and pelvis was performed following the standard protocol without IV contrast. COMPARISON:  CT abdomen pelvis 04/20/2019, chest radiograph Jun 16, 2018, CT  chest 09/23/2000 (report only, images unavailable) FINDINGS: CT CHEST FINDINGS Cardiovascular: The aortic root is suboptimally assessed given cardiac pulsation artifact. Gross preservation of the sino-tubular junction. Mild dilatation of the ascending thoracic aorta to 4.4 cm with return to a normal aortic caliber at the level of the distal arch measuring 2.6 cm. Mild atherosclerotic calcification of the thoracic aorta. No abnormal hyperdense mural thickening or plaque displacement to suggest intramural hematoma. No periaortic stranding or hemorrhage. Normal 3 vessel branching of the aortic arch with minimal calcification of the proximal great vessels. Central pulmonary arteries are normal caliber. Normal heart size. Trace pericardial fluid likely within physiologic normal and unchanged from comparison CT. Extensive three-vessel coronary artery calcifications are noted. Luminal evaluation of the vasculature precluded in the absence of contrast media. Mediastinum/Nodes: No mediastinal fluid or gas. Normal thyroid gland and thoracic inlet. No acute abnormality of the trachea or esophagus. No worrisome mediastinal or axillary adenopathy. Hilar nodal evaluation is limited in the absence of intravenous contrast media. Lungs/Pleura: Bandlike area of opacity in the right lung base  compatible with subsegmental atelectasis and/or scarring. More dependent atelectasis seen posteriorly in the lung bases. No consolidation, features of edema, pneumothorax, or effusion. No suspicious pulmonary nodules or masses. Musculoskeletal: Multilevel degenerative changes are present in the imaged portions of the spine. No acute osseous abnormality or suspicious osseous lesion. No chest wall mass or suspicious soft tissue lesions identified. Paucity of subcutaneous fat. CT ABDOMEN PELVIS FINDINGS Hepatobiliary: Redemonstration of the innumerable hypoattenuating foci throughout the liver, many of which are too small to characterize but likely reflect benign hepatic cysts. Fluid attenuation cyst in the left lobe again measures up to 3.2 cm (2/54). No worrisome liver lesion visible within the unenhanced liver. Gallbladder is largely decompressed at the time of exam. No frank biliary ductal dilatation or visible calcified gallstones. Pancreas: Pancreas is anteriorly displaced by the multi-cystic renal parenchyma. No visible pancreatic lesions. No peripancreatic inflammation or evidence of ductal dilatation. Spleen: Spleen is displaced anteriorly. Otherwise unremarkable and normal size without focal splenic lesion. Adrenals/Urinary Tract: Adrenal glands are difficult to discern from the multi-cystic renal parenchyma. There are numerous bilateral renal cysts of varying size and complexity including several intermediate attenuation foci in the upper pole right kidney (2/56, 62) and lower pole left kidney (2/96). Multiple areas of ill-defined calcification seen within the multi cystic renal parenchyma which could reflect septal calcification and or nonobstructing calculi. Difficult to discern the normal renal parenchyma and collecting system. No visible calcifications along the expected course of either ureter nor visible lodged within the otherwise normal urinary bladder. Some mild indentation of the posterior bladder  by the enlarged prostate. Stomach/Bowel: Technically difficult assessment of the bowel due to a paucity of intraperitoneal fat and the large multi cystic kidneys. Distal esophagus and stomach are free of acute abnormality. Duodenal sweep crosses the midline abdomen. Much of the large and small bowel is displaced anteriorly. No gross bowel wall thickening, dilatation, or evidence of obstruction. The appendix is not identified. Vascular/Lymphatic: Atherosclerotic plaque within the normal caliber aorta. Evaluation of the lymph nodes is limited given a paucity of intraperitoneal fat and lack of intravenous contrast material. No gross adenopathy is appreciable. Reproductive: Coarse eccentric calcification of the mildly enlarged prostate. No concerning abnormalities of the prostate or seminal vesicles. Benign calcifications of the penile corpora. Other: No abdominopelvic free fluid or free gas. No bowel containing hernias. Paucity of intraperitoneal and subcutaneous fat. Musculoskeletal: Multilevel degenerative changes are present in the imaged  portions of the spine. Mild grade 1 anterolisthesis L4 on L5 without associated pars defect. No acute osseous abnormality or suspicious osseous lesion. Additional mild degenerative changes in the hips and SI joints. IMPRESSION: 1. No definite acute abnormality seen in the chest, abdomen or pelvis. Specifically no convincing CT features of acute pancreatitis. Elevation of the D-dimer is nonspecific and exclusion of a pulmonary embolus is precluded on this exam given the absence of intravenous contrast media. 2. Extensive multicystic appearance of the liver and kidneys with varying degrees of size and complexity within the renal cysts most compatible with autosomal dominant polycystic kidney disease. Appearance of several complex and hyperdense cyst is grossly unchanged from prior. 3. Mild dilatation of the ascending thoracic aorta to 4.4 cm with return to a normal aortic caliber at  the level of the distal arch measuring 2.6 cm. Recommend annual imaging followup by CTA or MRA. This recommendation follows 2010 ACCF/AHA/AATS/ACR/ASA/SCA/SCAI/SIR/STS/SVM Guidelines for the Diagnosis and Management of Patients with Thoracic Aortic Disease. Circulation. 2010; 121: U440-H474. Aortic aneurysm NOS (ICD10-I71.9) 4. Coronary artery calcifications are present. Please note that the presence of coronary artery calcium documents the presence of coronary artery disease, the severity of this disease and any potential stenosis cannot be assessed on this non-gated CT examination. Assessment for potential risk factor modification, dietary therapy or pharmacologic therapy may be warranted. 5. Aortic Atherosclerosis (ICD10-I70.0). 6. Mild grade 1 anterolisthesis L4 on L5 without associated pars defect. 7. Mild prostatomegaly. 8. Paucity of subcutaneous and intraperitoneal fat, correlate with nutritional status. Electronically Signed   By: Lovena Le M.D.   On: 05/22/2019 18:29   CT Chest Wo Contrast  Result Date: 05/22/2019 CLINICAL DATA:  Epigastric and left chest pain, mildly elevated lipase (69), elevated D-dimer EXAM: CT CHEST, ABDOMEN AND PELVIS WITHOUT CONTRAST TECHNIQUE: Multidetector CT imaging of the chest, abdomen and pelvis was performed following the standard protocol without IV contrast. COMPARISON:  CT abdomen pelvis 04/20/2019, chest radiograph Jun 16, 2018, CT chest 09/23/2000 (report only, images unavailable) FINDINGS: CT CHEST FINDINGS Cardiovascular: The aortic root is suboptimally assessed given cardiac pulsation artifact. Gross preservation of the sino-tubular junction. Mild dilatation of the ascending thoracic aorta to 4.4 cm with return to a normal aortic caliber at the level of the distal arch measuring 2.6 cm. Mild atherosclerotic calcification of the thoracic aorta. No abnormal hyperdense mural thickening or plaque displacement to suggest intramural hematoma. No periaortic stranding  or hemorrhage. Normal 3 vessel branching of the aortic arch with minimal calcification of the proximal great vessels. Central pulmonary arteries are normal caliber. Normal heart size. Trace pericardial fluid likely within physiologic normal and unchanged from comparison CT. Extensive three-vessel coronary artery calcifications are noted. Luminal evaluation of the vasculature precluded in the absence of contrast media. Mediastinum/Nodes: No mediastinal fluid or gas. Normal thyroid gland and thoracic inlet. No acute abnormality of the trachea or esophagus. No worrisome mediastinal or axillary adenopathy. Hilar nodal evaluation is limited in the absence of intravenous contrast media. Lungs/Pleura: Bandlike area of opacity in the right lung base compatible with subsegmental atelectasis and/or scarring. More dependent atelectasis seen posteriorly in the lung bases. No consolidation, features of edema, pneumothorax, or effusion. No suspicious pulmonary nodules or masses. Musculoskeletal: Multilevel degenerative changes are present in the imaged portions of the spine. No acute osseous abnormality or suspicious osseous lesion. No chest wall mass or suspicious soft tissue lesions identified. Paucity of subcutaneous fat. CT ABDOMEN PELVIS FINDINGS Hepatobiliary: Redemonstration of the innumerable hypoattenuating foci throughout the  liver, many of which are too small to characterize but likely reflect benign hepatic cysts. Fluid attenuation cyst in the left lobe again measures up to 3.2 cm (2/54). No worrisome liver lesion visible within the unenhanced liver. Gallbladder is largely decompressed at the time of exam. No frank biliary ductal dilatation or visible calcified gallstones. Pancreas: Pancreas is anteriorly displaced by the multi-cystic renal parenchyma. No visible pancreatic lesions. No peripancreatic inflammation or evidence of ductal dilatation. Spleen: Spleen is displaced anteriorly. Otherwise unremarkable and  normal size without focal splenic lesion. Adrenals/Urinary Tract: Adrenal glands are difficult to discern from the multi-cystic renal parenchyma. There are numerous bilateral renal cysts of varying size and complexity including several intermediate attenuation foci in the upper pole right kidney (2/56, 62) and lower pole left kidney (2/96). Multiple areas of ill-defined calcification seen within the multi cystic renal parenchyma which could reflect septal calcification and or nonobstructing calculi. Difficult to discern the normal renal parenchyma and collecting system. No visible calcifications along the expected course of either ureter nor visible lodged within the otherwise normal urinary bladder. Some mild indentation of the posterior bladder by the enlarged prostate. Stomach/Bowel: Technically difficult assessment of the bowel due to a paucity of intraperitoneal fat and the large multi cystic kidneys. Distal esophagus and stomach are free of acute abnormality. Duodenal sweep crosses the midline abdomen. Much of the large and small bowel is displaced anteriorly. No gross bowel wall thickening, dilatation, or evidence of obstruction. The appendix is not identified. Vascular/Lymphatic: Atherosclerotic plaque within the normal caliber aorta. Evaluation of the lymph nodes is limited given a paucity of intraperitoneal fat and lack of intravenous contrast material. No gross adenopathy is appreciable. Reproductive: Coarse eccentric calcification of the mildly enlarged prostate. No concerning abnormalities of the prostate or seminal vesicles. Benign calcifications of the penile corpora. Other: No abdominopelvic free fluid or free gas. No bowel containing hernias. Paucity of intraperitoneal and subcutaneous fat. Musculoskeletal: Multilevel degenerative changes are present in the imaged portions of the spine. Mild grade 1 anterolisthesis L4 on L5 without associated pars defect. No acute osseous abnormality or suspicious  osseous lesion. Additional mild degenerative changes in the hips and SI joints. IMPRESSION: 1. No definite acute abnormality seen in the chest, abdomen or pelvis. Specifically no convincing CT features of acute pancreatitis. Elevation of the D-dimer is nonspecific and exclusion of a pulmonary embolus is precluded on this exam given the absence of intravenous contrast media. 2. Extensive multicystic appearance of the liver and kidneys with varying degrees of size and complexity within the renal cysts most compatible with autosomal dominant polycystic kidney disease. Appearance of several complex and hyperdense cyst is grossly unchanged from prior. 3. Mild dilatation of the ascending thoracic aorta to 4.4 cm with return to a normal aortic caliber at the level of the distal arch measuring 2.6 cm. Recommend annual imaging followup by CTA or MRA. This recommendation follows 2010 ACCF/AHA/AATS/ACR/ASA/SCA/SCAI/SIR/STS/SVM Guidelines for the Diagnosis and Management of Patients with Thoracic Aortic Disease. Circulation. 2010; 121: I627-O350. Aortic aneurysm NOS (ICD10-I71.9) 4. Coronary artery calcifications are present. Please note that the presence of coronary artery calcium documents the presence of coronary artery disease, the severity of this disease and any potential stenosis cannot be assessed on this non-gated CT examination. Assessment for potential risk factor modification, dietary therapy or pharmacologic therapy may be warranted. 5. Aortic Atherosclerosis (ICD10-I70.0). 6. Mild grade 1 anterolisthesis L4 on L5 without associated pars defect. 7. Mild prostatomegaly. 8. Paucity of subcutaneous and intraperitoneal fat, correlate  with nutritional status. Electronically Signed   By: Lovena Le M.D.   On: 05/22/2019 18:29   NM PULMONARY VENT AND PERF (V/Q Scan)  Result Date: 05/22/2019 CLINICAL DATA:  Epigastric and left-sided chest pain EXAM: NUCLEAR MEDICINE VENTILATION - PERFUSION LUNG SCAN TECHNIQUE:  Ventilation images were obtained in multiple projections using inhaled aerosol Tc-69m DTPA. Perfusion images were obtained in multiple projections after intravenous injection of Tc-43m MAA. RADIOPHARMACEUTICALS:  40.9 mCi of Tc-13m DTPA aerosol inhalation and 1.6 mCi Tc97m MAA IV COMPARISON:  CT same day FINDINGS: Ventilation: There is a small ventilation defects seen within the right lower lobe and heights is artery to of a there neck and height is Dr. Lorenso Courier to the head high on the abdomen CT psychosis from same day cardiac CT or CT at yet number on the back left lower lobe. No other ventilation defects are seen. Perfusion: There is also a small perfusion defect seen within the right lower lobe and left lower lobe. No large perfusion defects are seen. IMPRESSION: Two matched small perfusion defects within the right lower lobe and left lower lobe with very low probability of pulmonary embolism based on PIOPED criteria. Electronically Signed   By: Prudencio Pair M.D.   On: 05/22/2019 22:01    Assessment & Plan:   Kashden was seen today for abdominal pain.  Diagnoses and all orders for this visit:  Dysuria- His urinalysis is normal.  I do not think he has a urinary tract infection. -     POCT Urinalysis Dipstick (Automated)  Irritable bowel syndrome with both constipation and diarrhea- I recommended that he try glycopyrrolate to relieve the symptoms. -     glycopyrrolate (ROBINUL) 1 MG tablet; Take 1 tablet (1 mg total) by mouth 3 (three) times daily between meals as needed.   I have discontinued Josie Saunders. Lavergne's lubiprostone. I am also having him start on glycopyrrolate. Additionally, I am having him maintain his fexofenadine, finasteride, acetaminophen, vitamin B-12, clonazePAM, omeprazole, alfuzosin, Probiotic Product ([place]), donepezil, Carbidopa-Levodopa ER, meclizine, and FLUoxetine.  Meds ordered this encounter  Medications  . glycopyrrolate (ROBINUL) 1 MG tablet    Sig: Take 1  tablet (1 mg total) by mouth 3 (three) times daily between meals as needed.    Dispense:  90 tablet    Refill:  3     Follow-up: Return in about 3 months (around 11/18/2019).  Scarlette Calico, MD

## 2019-08-24 ENCOUNTER — Telehealth: Payer: Self-pay | Admitting: Psychiatry

## 2019-08-24 NOTE — Telephone Encounter (Signed)
Melecio's daughter Lars Pinks requesting to restart the generic brand Prozac. It was stopped temporarily to rule out the cause of his diarrhea. Next appt scheduled for 8/19. Fax to [facility] [place].

## 2019-08-24 NOTE — Telephone Encounter (Signed)
See phone visit from 08/06/2019, discussed temporary stop of Prozac due to GI issues.   Will write Rx and fax to [facility] per request.

## 2019-08-24 NOTE — Telephone Encounter (Signed)
agreed

## 2019-08-25 ENCOUNTER — Other Ambulatory Visit: Payer: Self-pay

## 2019-08-25 NOTE — Telephone Encounter (Signed)
Order to restart Fluoxetine 10 mg capsules 1 capsule by mouth daily faxed to [facility] at [facility] [phone].

## 2019-09-10 ENCOUNTER — Other Ambulatory Visit: Payer: Self-pay

## 2019-09-10 ENCOUNTER — Encounter: Payer: Self-pay | Admitting: Psychiatry

## 2019-09-10 ENCOUNTER — Ambulatory Visit (INDEPENDENT_AMBULATORY_CARE_PROVIDER_SITE_OTHER): Payer: Medicare Other | Admitting: Psychiatry

## 2019-09-10 DIAGNOSIS — F331 Major depressive disorder, recurrent, moderate: Secondary | ICD-10-CM

## 2019-09-10 DIAGNOSIS — F5105 Insomnia due to other mental disorder: Secondary | ICD-10-CM | POA: Diagnosis not present

## 2019-09-10 DIAGNOSIS — G2581 Restless legs syndrome: Secondary | ICD-10-CM | POA: Diagnosis not present

## 2019-09-10 DIAGNOSIS — G20 Parkinson's disease: Secondary | ICD-10-CM

## 2019-09-10 DIAGNOSIS — F0280 Dementia in other diseases classified elsewhere without behavioral disturbance: Secondary | ICD-10-CM

## 2019-09-10 MED ORDER — MEMANTINE HCL 28 X 5 MG & 21 X 10 MG PO TABS: ORAL_TABLET | ORAL | 12 refills | Status: DC

## 2019-09-10 NOTE — Progress Notes (Signed)
Henry Adams [id number] 1940-06-14 79 y.o.  Subjective:   Patient ID:  Henry Adams is a 79 y.o. (DOB 1940/02/04) male.  Chief Complaint:  Chief Complaint  Patient presents with  . Follow-up    med change  . Depression  . Altered Mental Status  . Memory Loss    Depression        Associated symptoms include decreased concentration and fatigue.  Henry Adams presents to the office today for follow-up of depression.    First seen Oct 28 started Trintellix 5. Couldn't tolerate it DT dizziness and GI pain.  Resolved off it. Switched to duloxetine 12/6 20 mg daily.  seen January 01, 2019 with daughter present.  We continued recently started duloxetine 20 mg daily and added Ritalin 5 mg twice daily for augmentation and cognitive reasons.D stopped duloxetine 12/20 bc confusion which has gotten better 50% better but still depressed.   seen January 29, 2019 they were encouraged to try Ritalin augmentation for depression at 5 mg twice daily.  February 24, 2019 is daughter [name] called stating he was "more confused, angry, and upset".  The only med change has been the addition of Ritalin and she was encouraged to stop that.  Patient was taking clonazepam 0.5 mg nightly and the option was offered to increase that to 0.75 mg nightly if needed  Last seen February 26, 2019.[name]  Ritalin had been stopped due to agitation. Yesterday was first day off Ritalin and agitation was better, but evening is associated with anxiety and confusion.   It was having sleep issues and the following was changed: Increase to 1 and 1/2 clonazepam of 0.5 mg at night for improving sleep.  Daughter called February 12 noting more cognitive decline.  Stating he needs something to calm him down because of anxiety and agitation.  She was given the following information: At his last visit February 19, 2019 he was having some sleep issues and we increase clonazepam from 0.5 mg nightly to 0.5 mg tablets 1-1/2 nightly.  That is a  sedative.  I would prefer not to mix sedatives if possible.  Therefore use clonazepam 0.5 mg tablets 1/2 to 1 tablet daily during the day in addition to the nighttime dose as needed for agitation.  As of visit 04/09/19 comes with daughter for history. [name]. Dizzy in AM and balance problems with clonazepam 0.75 mg HS. Tolerated clonazepam 0.25 mg added at 3 pm without dizziness and it helped calm him.   "I feel pretty good" today. Clonazepam added at 3 PM did resolve the agitation in the evening so far and is tolerated.  He is not markedly depressed at this time. Moved to independent living apt January 2021. Going to asst living [facility], [place] next week.   Has been difficult with a lot of anxiety over it.  Feels overwhelmed with expectations.  Calling D in middle of night confused. He doesn't remember doing so.  Pt denies being nervous there. Huge changes per D in last 3-4 weeks with greater irritability. [place]. Goes to bed about 930.   Not deeply depressed nor anxious.  Low energy.  Parkinson's interferes with normal activities.  Enjoys family activities.  Not reading much but can.  Watches some TV but less than in the past. Plan: increase Aricept to 10  07/10/19 appointment the following is noted:   Resident [place] and treated OK except the dining staff. He thinks relatively he's doing OK.  Usually sleep is OK. No  problems with increase donepezil 10 mg.   D concerned he's not sleeping a lot at night.  Little TV watching. Some EMA.  Up for the day by 6 AM.   RLS is managed usually but he told D he had a problem last night but overall "tremendous improvement".  Walking 2 times daily. Some depression.  Very interest and can get excited but can't do what he used to do.  Parkinson's interferes with execution of what he wants to do.  Appetite is good.  Conc and memory problems.    No hallucinations lately.   D notes negative interpretation of events. Started fluoxetine 10  mg daily  09/10/19 appt with following noted:  Seen with D Arrived 17 min late. Had bout of diarrhea after starting fluoxetine and stopped then restarted 8/4.   No diarrhea in the last couple of weeks. D notes lately has been fearful thinking people are spying on him and he considered calling the police.  At times doesn't want to take meds.  Being worried about what he says.   D asks about Namenda. Resident [place]  Past Psychiatric History: History of Lexapro 20 helped awhile but partially effective for anxiety and depression  Trintellix SE after 4-5 week without benefit.  Duloxetine 20 Ritalin 5 BID Clonazepam helped anxiety and RLS and sleep  History Wellbutrin XL 300,  trazodone for insomnia and didn't help the RLS. History of Seroquel 25 Rx.  Never taken. Melatonin dizzy  Review of Systems:  Review of Systems  Constitutional: Positive for fatigue.  HENT: Positive for voice change.   Gastrointestinal: Positive for constipation. Negative for abdominal distention, diarrhea and nausea.  Neurological: Positive for tremors and weakness. Negative for dizziness and light-headedness.  Psychiatric/Behavioral: Positive for confusion, decreased concentration, depression and dysphoric mood.    Medications: I have reviewed the patient's current medications.  Current Outpatient Medications  Medication Sig Dispense Refill  . acetaminophen (TYLENOL) 500 MG tablet 500 mg prn for back, knee, shoulder or head pain. May be given up to every 6 hours. 90 tablet 3  . alfuzosin (UROXATRAL) 10 MG 24 hr tablet Take 1 tablet (10 mg total) by mouth 2 (two) times daily. 10 mg at 7 am and 7 pm daily.    . Carbidopa-Levodopa ER (SINEMET CR) 25-100 MG tablet controlled release 1 tablet at 7 AM/11 AM/3 PM/1.5 tablets at 7 PM/1 at 9pm 540 tablet 1  . clonazePAM (KLONOPIN) 0.5 MG tablet Take 1/2-1 tablet by mouth during the day as needed for anxiety/agitation and take 1.5 tablets at bedtime as needed for  sleep/anxiety (Patient taking differently: Take 0.25-0.5 mg by mouth See admin instructions. Take 0.25mg  at 3pm and 0.5mg  at 9pm) 75 tablet 0  . donepezil (ARICEPT) 10 MG tablet Take 1 tablet (10 mg total) by mouth at bedtime. 90 tablet 1  . fexofenadine (ALLEGRA) 180 MG tablet 180mg  tablet at 11 am. (Patient taking differently: Take 180 mg by mouth daily. 11 am.) 90 tablet 3  . finasteride (PROSCAR) 5 MG tablet 5mg  at 7pm daily (Patient taking differently: Take 5 mg by mouth every evening. 7pm) 90 tablet 3  . FLUoxetine (PROZAC) 10 MG capsule Take 1 capsule (10 mg total) by mouth daily. 30 capsule 1  . glycopyrrolate (ROBINUL) 1 MG tablet Take 1 tablet (1 mg total) by mouth 3 (three) times daily between meals as needed. 90 tablet 3  . meclizine (ANTIVERT) 12.5 MG tablet Take 1 tablet (12.5 mg total) by mouth 3 (three) times  daily as needed. 90 tablet 2  . omeprazole (PRILOSEC) 40 MG capsule TAKE (1) CAPSULE DAILY. (Patient taking differently: Take 40 mg by mouth daily. ) 90 capsule 0  . Probiotic Product ([place]) Take by mouth.    . vitamin B-12 (CYANOCOBALAMIN) 1000 MCG tablet 1000 mcg tablet at 11 am daily. (Patient taking differently: Take 1,000 mcg by mouth daily. ) 90 tablet 3  . memantine (NAMENDA TITRATION PAK) tablet pack 5 mg/day for =1 week; 5 mg twice daily for =1 week; 15 mg/day given in 5 mg and 10 mg separated doses for =1 week; then 10 mg twice daily 49 tablet 12   No current facility-administered medications for this visit.    Medication Side Effects: None obvious  Allergies:  Allergies  Allergen Reactions  . Iodine     rash  . Oxycodone     Mental status changes & nausea  . Pollen Extract Itching  . Tamiflu [Oseltamivir Phosphate] Other (See Comments)    hallucinations  . Tamsulosin     Other reaction(s): Other (See Comments) Unknown   . Doxazosin Rash    agitation  . Etodolac Rash    unknown  . Tape Rash    Certain "Band-Aids"    Past Medical  History:  Diagnosis Date  . Allergy    perennial  . Anxiety   . Arthritis   . Cholelithiasis   . Chronic idiopathic constipation   . CKD (chronic kidney disease)   . Depression   . Diverticulosis   . Enlarged prostate   . Gallstones   . GERD (gastroesophageal reflux disease)   . HLD (hyperlipidemia)   . Hx of cardiovascular stress test    ETT-Myoview 6/14: probable low risk study, inf defect with normal inf wall motion-cannot r/o ischemia; prominent gut uptake adjacent to inf wall on rest images-poss defect is atten, EF 63%  . Hypertension   . Melanoma ([place])   . Parkinsonism ([place])   . Polycystic kidney disease     Family History  Problem Relation Age of Onset  . Hypertension Father   . Cancer Brother        mouth  . Cancer Sister        breast  . Heart disease Brother        heart transplant  . Cancer Daughter        breast  . Stroke Sister        >62  . Skin cancer Brother        squamous cell  . Colon cancer Neg Hx     Social History   Socioeconomic History  . Marital status: Married    Spouse name: Not on file  . Number of children: 2  . Years of education: Not on file  . Highest education level: Not on file  Occupational History  . 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 Smoking status: Never Smoker  . Smokeless tobacco: Never Used  Vaping Use  . Vaping Use: Never used  Substance and Sexual Activity  . Alcohol use: Not Currently    Alcohol/week: 0.0 standard drinks  . Drug use: No  . Sexual activity: Not on file  Other Topics Concern  . Not on file  Social History Narrative  . Not on file   Social Determinants of Health   Financial Resource Strain:   . Difficulty of Paying Living Expenses: Not on file  Food Insecurity:   .  Worried About Charity fundraiser in the Last Year: Not on file  . Ran Out of Food in the Last Year: Not on file  Transportation Needs:   . Lack of Transportation (Medical):  Not on file  . Lack of Transportation (Non-Medical): Not on file  Physical Activity:   . Days of Exercise per Week: Not on file  . Minutes of Exercise per Session: Not on file  Stress:   . Feeling of Stress : Not on file  Social Connections:   . Frequency of Communication with Friends and Family: Not on file  . Frequency of Social Gatherings with Friends and Family: Not on file  . Attends Religious Services: Not on file  . Active Member of Clubs or Organizations: Not on file  . Attends Archivist Meetings: Not on file  . Marital Status: Not on file  Intimate Partner Violence:   . Fear of Current or Ex-Partner: Not on file  . Emotionally Abused: Not on file  . Physically Abused: Not on file  . Sexually Abused: Not on file    Past Medical History, Surgical history, Social history, and Family history were reviewed and updated as appropriate.   Please see review of systems for further details on the patient's review from today.   Objective:   Physical Exam:  There were no vitals taken for this visit.  Physical Exam Constitutional:      Appearance: Normal appearance. He is normal weight.  Neurological:     Mental Status: He is alert and oriented to person, place, and time.     Motor: Weakness and tremor present.     Coordination: Coordination abnormal.     Gait: Gait abnormal.     Comments: Short gait.  Psychiatric:        Attention and Perception: He does not perceive auditory or visual hallucinations.        Mood and Affect: Mood is depressed. Mood is not anxious or elated. Affect is blunt. Affect is not tearful.        Speech: Speech is delayed.        Behavior: Behavior is slowed.        Thought Content: Thought content is not paranoid. Thought content does not include homicidal or suicidal ideation.        Cognition and Memory: Cognition is impaired. Memory is impaired. He exhibits impaired recent memory.     Comments: Word finding problems and appears to  confabulate A little more depressed. Insight and judgment fair. Slowed responses significantly. Reduced volume and hard to understand Responses intermittently make no sense.    Loses track of thoughts at time      Lab Review:     Component Value Date/Time   NA 138 08/03/2019 1408   K 3.8 08/03/2019 1408   CL 104 08/03/2019 1408   CO2 27 08/03/2019 1408   GLUCOSE 104 (H) 08/03/2019 1408   GLUCOSE 105 (H) 12/03/2005 0000   BUN 22 08/03/2019 1408   CREATININE 1.39 (H) 08/03/2019 1408   CALCIUM 8.8 08/03/2019 1408   PROT 7.0 05/21/2019 1355   ALBUMIN 3.9 05/21/2019 1355   AST 16 05/21/2019 1355   ALT 8 05/21/2019 1355   ALKPHOS 72 05/21/2019 1355   BILITOT 0.9 05/21/2019 1355   GFRNONAA 53 (L) 05/22/2019 1421   GFRAA >60 05/22/2019 1421       Component Value Date/Time   WBC 5.5 08/03/2019 1408   RBC 3.81 (L) 08/03/2019 1408  HGB 12.3 (L) 08/03/2019 1408   HCT 36.6 (L) 08/03/2019 1408   PLT 140 08/03/2019 1408   MCV 96.1 08/03/2019 1408   MCH 32.3 08/03/2019 1408   MCHC 33.6 08/03/2019 1408   RDW 12.5 08/03/2019 1408   LYMPHSABS 1,375 08/03/2019 1408   MONOABS 0.7 05/21/2019 1355   EOSABS 138 08/03/2019 1408   BASOSABS 61 08/03/2019 1408   neuropsych testing Dr. Melvyn Novas 08/2018 [facility]  No results found for: POCLITH, LITHIUM   No results found for: PHENYTOIN, PHENOBARB, VALPROATE, CBMZ   .res Assessment: Plan:    Eduardo was seen today for follow-up, depression, altered mental status and memory loss.  Diagnoses and all orders for this visit:  Major depressive disorder, recurrent episode, moderate (HCC)  Restless leg syndrome  Insomnia due to mental condition  Dementia associated with Parkinson's disease ([place]) -     memantine (NAMENDA TITRATION PAK) tablet pack; 5 mg/day for =1 week; 5 mg twice daily for =1 week; 15 mg/day given in 5 mg and 10 mg separated doses for =1 week; then 10 mg twice daily   Greater than 50% of 30 min face to face time with patient was  spent on counseling and coordination of care. We discussed the complex and the relationship between depression, Parkinson's disease including the cognitive complications of it.  These are all interrelated in his case.  He is cognitively slow and obviously physically slow due to the Parkinson's disease. It appears cognition is worse and more paranoid, more forgetful in [facility].   It is difficult to tell how much depression contributes but he does not acknowledge severe depression at this time.  More paranoia noted this visit.  Not on fluoxetine long enough to see benefit but overall prognosis is poor.    Residual depression.  Not on antidepressant.  Consider more noradrenergic antidepressant like Pristiq. Or easier to use would be fluoxetine bc less withdrawal concerns and history partial benefit with Lexapro. Fluoxetine 10 mg in AM give it more time.  If suspect it's contributing to paranoia stop it.  They tried reducing the clonazepam but his confusion did not get better. Still would suggest try to stop 3 pm dose of clonazepam for cognitive reasons.  It's been used for RLS and agitation.  Therefore switching BZ probably won't help confusion which is probably Parkinson's dementia at this time.  We discussed the short-term risks associated with benzodiazepines including sedation and increased fall risk among others.  Discussed long-term side effect risk including dependence, potential withdrawal symptoms, and the potential eventual dose-related risk of dementia. He did not tolerate clonazepam 0.75 mg nightly because of morning dizziness.    [name] asks concerns about Parkinson's Dz and hallucinations.  problem currently.  Disc at length.  Continue donepezil.  Likely moved from [facility] to early dementia from PD Trial Namenda to do all we can for cognition.  Encouraged physical, social, and mental stimulation to help offset some of his symptoms as well.  The treatment plan was discussed with both the patient and  his daughter.    FU 2 mos  Lynder Parents, MD, DFAPA    Please see After Visit Summary for patient specific instructions.  Future Appointments  Date Time Provider [place]  11/30/2019 11:15 AM Tat, Eustace Quail, DO [facility] None    No orders of the defined types were placed in this encounter.   -------------------------------

## 2019-09-22 ENCOUNTER — Telehealth: Payer: Self-pay | Admitting: Internal Medicine

## 2019-09-22 NOTE — Telephone Encounter (Signed)
    Patient's daughter calling to report patient has dark urine and tailbone pain that he mentioned today. Patient currently resides at [facility]. The facility is on lock down due to [place] cases.  Patient's daughter wants to know if urine test can be ordered .

## 2019-09-24 NOTE — Telephone Encounter (Addendum)
   Daughter [name] calling, can urine test order be sent to ALF?  Facility on lock down until 9/3 [name] advised no available appointment on 9/3.  Please advise  Please call daughter

## 2019-09-25 ENCOUNTER — Other Ambulatory Visit: Payer: Medicare Other

## 2019-09-25 ENCOUNTER — Other Ambulatory Visit: Payer: Self-pay

## 2019-09-25 DIAGNOSIS — R300 Dysuria: Secondary | ICD-10-CM

## 2019-09-25 NOTE — Telephone Encounter (Signed)
Pt's daughter has been informed and will bring pt to the lab for UA.

## 2019-09-26 LAB — URINE CULTURE: Result:: NO GROWTH

## 2019-09-26 LAB — URINALYSIS, ROUTINE W REFLEX MICROSCOPIC
Bacteria, UA: NONE SEEN /HPF
Bilirubin Urine: NEGATIVE
Glucose, UA: NEGATIVE
Hgb urine dipstick: NEGATIVE
Hyaline Cast: NONE SEEN /LPF
Nitrite: NEGATIVE
RBC / HPF: NONE SEEN /HPF (ref 0–2)
Specific Gravity, Urine: 1.024 (ref 1.001–1.03)
Squamous Epithelial / HPF: NONE SEEN /HPF (ref <=5)
WBC, UA: NONE SEEN /HPF (ref 0–5)
pH: 6 (ref 5.0–8.0)

## 2019-10-05 ENCOUNTER — Other Ambulatory Visit: Payer: Self-pay

## 2019-10-05 ENCOUNTER — Ambulatory Visit (INDEPENDENT_AMBULATORY_CARE_PROVIDER_SITE_OTHER): Payer: Medicare Other

## 2019-10-05 ENCOUNTER — Ambulatory Visit (INDEPENDENT_AMBULATORY_CARE_PROVIDER_SITE_OTHER): Payer: Medicare Other | Admitting: Internal Medicine

## 2019-10-05 ENCOUNTER — Encounter: Payer: Self-pay | Admitting: Internal Medicine

## 2019-10-05 VITALS — BP 126/84 | HR 65 | Temp 98.5°F | Resp 16 | Ht 71.0 in | Wt 161.0 lb

## 2019-10-05 DIAGNOSIS — M533 Sacrococcygeal disorders, not elsewhere classified: Secondary | ICD-10-CM | POA: Diagnosis not present

## 2019-10-05 DIAGNOSIS — Z23 Encounter for immunization: Secondary | ICD-10-CM | POA: Diagnosis not present

## 2019-10-05 DIAGNOSIS — L89301 Pressure ulcer of unspecified buttock, stage 1: Secondary | ICD-10-CM | POA: Diagnosis not present

## 2019-10-05 DIAGNOSIS — K582 Mixed irritable bowel syndrome: Secondary | ICD-10-CM | POA: Diagnosis not present

## 2019-10-05 MED ORDER — LOPERAMIDE HCL 2 MG PO TABS: 2.0000 mg | ORAL_TABLET | Freq: Four times a day (QID) | ORAL | 2 refills | Status: DC | PRN

## 2019-10-05 NOTE — Progress Notes (Signed)
Subjective:  Patient ID: Henry Adams, male    DOB: 29-Apr-1940  Age: 79 y.o. MRN: [id number]  CC: Follow-up  This visit occurred during the SARS-CoV-2 public health emergency.  Safety protocols were in place, including screening questions prior to the visit, additional usage of staff PPE, and extensive cleaning of exam room while observing appropriate contact time as indicated for disinfecting solutions.    HPI Henry Adams presents for f/up - His daughter is with him today and tells most of the history.  There is a vague reference to a recent fall.  For the last few days he has been complaining of tailbone pain.  He has also had a few episodes of diarrhea and she requested a prescription for Imodium.  Outpatient Medications Prior to Visit  Medication Sig Dispense Refill  . acetaminophen (TYLENOL) 500 MG tablet 500 mg prn for back, knee, shoulder or head pain. May be given up to every 6 hours. 90 tablet 3  . alfuzosin (UROXATRAL) 10 MG 24 hr tablet Take 1 tablet (10 mg total) by mouth 2 (two) times daily. 10 mg at 7 am and 7 pm daily.    . Carbidopa-Levodopa ER (SINEMET CR) 25-100 MG tablet controlled release 1 tablet at 7 AM/11 AM/3 PM/1.5 tablets at 7 PM/1 at 9pm 540 tablet 1  . clonazePAM (KLONOPIN) 0.5 MG tablet Take 1/2-1 tablet by mouth during the day as needed for anxiety/agitation and take 1.5 tablets at bedtime as needed for sleep/anxiety (Patient taking differently: Take 0.25-0.5 mg by mouth See admin instructions. Take 0.25mg  at 3pm and 0.5mg  at 9pm) 75 tablet 0  . donepezil (ARICEPT) 10 MG tablet Take 1 tablet (10 mg total) by mouth at bedtime. 90 tablet 1  . fexofenadine (ALLEGRA) 180 MG tablet 180mg  tablet at 11 am. (Patient taking differently: Take 180 mg by mouth daily. 11 am.) 90 tablet 3  . finasteride (PROSCAR) 5 MG tablet 5mg  at 7pm daily (Patient taking differently: Take 5 mg by mouth every evening. 7pm) 90 tablet 3  . FLUoxetine (PROZAC) 10 MG capsule Take 1 capsule (10  mg total) by mouth daily. 30 capsule 1  . meclizine (ANTIVERT) 12.5 MG tablet Take 1 tablet (12.5 mg total) by mouth 3 (three) times daily as needed. 90 tablet 2  . memantine (NAMENDA TITRATION PAK) tablet pack 5 mg/day for =1 week; 5 mg twice daily for =1 week; 15 mg/day given in 5 mg and 10 mg separated doses for =1 week; then 10 mg twice daily 49 tablet 12  . omeprazole (PRILOSEC) 40 MG capsule TAKE (1) CAPSULE DAILY. (Patient taking differently: Take 40 mg by mouth daily. ) 90 capsule 0  . Polyethylene Glycol 3350 (MIRALAX PO) Take by mouth.    . Probiotic Product ([place]) Take by mouth.    . vitamin B-12 (CYANOCOBALAMIN) 1000 MCG tablet 1000 mcg tablet at 11 am daily. (Patient taking differently: Take 1,000 mcg by mouth daily. ) 90 tablet 3  . glycopyrrolate (ROBINUL) 1 MG tablet Take 1 tablet (1 mg total) by mouth 3 (three) times daily between meals as needed. (Patient not taking: Reported on 10/05/2019) 90 tablet 3   No facility-administered medications prior to visit.    ROS Review of Systems  Constitutional: Positive for fatigue.  HENT: Negative.   Eyes: Negative.   Respiratory: Negative for cough, chest tightness, shortness of breath and wheezing.   Cardiovascular: Negative for chest pain, palpitations and leg swelling.  Gastrointestinal: Positive for  diarrhea. Negative for constipation and vomiting.  Endocrine: Negative.   Genitourinary: Negative.  Negative for difficulty urinating.  Musculoskeletal: Positive for back pain.  Skin: Negative.  Negative for color change and rash.  Neurological: Negative.  Negative for dizziness, weakness, light-headedness and numbness.  Hematological: Negative for adenopathy. Does not bruise/bleed easily.  Psychiatric/Behavioral: Negative.     Objective:  BP 126/84   Pulse 65   Temp 98.5 F (36.9 C) (Oral)   Resp 16   Ht 5\' 11"  (1.803 m)   Wt 161 lb (73 kg)   SpO2 95%   BMI 22.45 kg/m   BP Readings from Last 3  Encounters:  10/05/19 126/84  08/18/19 (!) 148/94  08/03/19 124/74    Wt Readings from Last 3 Encounters:  10/05/19 161 lb (73 kg)  08/18/19 158 lb 8 oz (71.9 kg)  08/03/19 165 lb (74.8 kg)    Physical Exam Vitals reviewed.  Constitutional:      Appearance: Normal appearance.  HENT:     Nose: Nose normal.     Mouth/Throat:     Mouth: Mucous membranes are moist.  Eyes:     General: No scleral icterus.    Conjunctiva/sclera: Conjunctivae normal.  Cardiovascular:     Rate and Rhythm: Normal rate and regular rhythm.     Heart sounds: No murmur heard.   Pulmonary:     Effort: Pulmonary effort is normal.     Breath sounds: No stridor. No wheezing, rhonchi or rales.  Abdominal:     General: Abdomen is flat.     Palpations: There is no mass.     Tenderness: There is no abdominal tenderness. There is no guarding.  Musculoskeletal:        General: Normal range of motion.     Cervical back: Neck supple.     Lumbar back: No swelling, edema, deformity, signs of trauma, tenderness or bony tenderness. Normal range of motion.       Back:     Right lower leg: No edema.     Left lower leg: No edema.  Lymphadenopathy:     Cervical: No cervical adenopathy.  Skin:    General: Skin is warm and dry.     Findings: Erythema present.  Neurological:     General: No focal deficit present.     Mental Status: He is alert.     Lab Results  Component Value Date   WBC 5.5 08/03/2019   HGB 12.3 (L) 08/03/2019   HCT 36.6 (L) 08/03/2019   PLT 140 08/03/2019   GLUCOSE 104 (H) 08/03/2019   CHOL 179 11/14/2016   TRIG 73.0 11/14/2016   HDL 68.50 11/14/2016   LDLDIRECT 131.7 07/02/2011   LDLCALC 96 11/14/2016   ALT 8 05/21/2019   AST 16 05/21/2019   NA 138 08/03/2019   K 3.8 08/03/2019   CL 104 08/03/2019   CREATININE 1.39 (H) 08/03/2019   BUN 22 08/03/2019   CO2 27 08/03/2019   TSH 1.36 06/25/2018   PSA 2.28 08/11/2015   INR 1.1 (H) 08/01/2012   HGBA1C 5.8 12/29/2018    CT  ABDOMEN PELVIS WO CONTRAST  Result Date: 05/22/2019 CLINICAL DATA:  Epigastric and left chest pain, mildly elevated lipase (69), elevated D-dimer EXAM: CT CHEST, ABDOMEN AND PELVIS WITHOUT CONTRAST TECHNIQUE: Multidetector CT imaging of the chest, abdomen and pelvis was performed following the standard protocol without IV contrast. COMPARISON:  CT abdomen pelvis 04/20/2019, chest radiograph Jun 16, 2018, CT chest 09/23/2000 (report only,  images unavailable) FINDINGS: CT CHEST FINDINGS Cardiovascular: The aortic root is suboptimally assessed given cardiac pulsation artifact. Gross preservation of the sino-tubular junction. Mild dilatation of the ascending thoracic aorta to 4.4 cm with return to a normal aortic caliber at the level of the distal arch measuring 2.6 cm. Mild atherosclerotic calcification of the thoracic aorta. No abnormal hyperdense mural thickening or plaque displacement to suggest intramural hematoma. No periaortic stranding or hemorrhage. Normal 3 vessel branching of the aortic arch with minimal calcification of the proximal great vessels. Central pulmonary arteries are normal caliber. Normal heart size. Trace pericardial fluid likely within physiologic normal and unchanged from comparison CT. Extensive three-vessel coronary artery calcifications are noted. Luminal evaluation of the vasculature precluded in the absence of contrast media. Mediastinum/Nodes: No mediastinal fluid or gas. Normal thyroid gland and thoracic inlet. No acute abnormality of the trachea or esophagus. No worrisome mediastinal or axillary adenopathy. Hilar nodal evaluation is limited in the absence of intravenous contrast media. Lungs/Pleura: Bandlike area of opacity in the right lung base compatible with subsegmental atelectasis and/or scarring. More dependent atelectasis seen posteriorly in the lung bases. No consolidation, features of edema, pneumothorax, or effusion. No suspicious pulmonary nodules or masses.  Musculoskeletal: Multilevel degenerative changes are present in the imaged portions of the spine. No acute osseous abnormality or suspicious osseous lesion. No chest wall mass or suspicious soft tissue lesions identified. Paucity of subcutaneous fat. CT ABDOMEN PELVIS FINDINGS Hepatobiliary: Redemonstration of the innumerable hypoattenuating foci throughout the liver, many of which are too small to characterize but likely reflect benign hepatic cysts. Fluid attenuation cyst in the left lobe again measures up to 3.2 cm (2/54). No worrisome liver lesion visible within the unenhanced liver. Gallbladder is largely decompressed at the time of exam. No frank biliary ductal dilatation or visible calcified gallstones. Pancreas: Pancreas is anteriorly displaced by the multi-cystic renal parenchyma. No visible pancreatic lesions. No peripancreatic inflammation or evidence of ductal dilatation. Spleen: Spleen is displaced anteriorly. Otherwise unremarkable and normal size without focal splenic lesion. Adrenals/Urinary Tract: Adrenal glands are difficult to discern from the multi-cystic renal parenchyma. There are numerous bilateral renal cysts of varying size and complexity including several intermediate attenuation foci in the upper pole right kidney (2/56, 62) and lower pole left kidney (2/96). Multiple areas of ill-defined calcification seen within the multi cystic renal parenchyma which could reflect septal calcification and or nonobstructing calculi. Difficult to discern the normal renal parenchyma and collecting system. No visible calcifications along the expected course of either ureter nor visible lodged within the otherwise normal urinary bladder. Some mild indentation of the posterior bladder by the enlarged prostate. Stomach/Bowel: Technically difficult assessment of the bowel due to a paucity of intraperitoneal fat and the large multi cystic kidneys. Distal esophagus and stomach are free of acute abnormality.  Duodenal sweep crosses the midline abdomen. Much of the large and small bowel is displaced anteriorly. No gross bowel wall thickening, dilatation, or evidence of obstruction. The appendix is not identified. Vascular/Lymphatic: Atherosclerotic plaque within the normal caliber aorta. Evaluation of the lymph nodes is limited given a paucity of intraperitoneal fat and lack of intravenous contrast material. No gross adenopathy is appreciable. Reproductive: Coarse eccentric calcification of the mildly enlarged prostate. No concerning abnormalities of the prostate or seminal vesicles. Benign calcifications of the penile corpora. Other: No abdominopelvic free fluid or free gas. No bowel containing hernias. Paucity of intraperitoneal and subcutaneous fat. Musculoskeletal: Multilevel degenerative changes are present in the imaged portions of the spine.  Mild grade 1 anterolisthesis L4 on L5 without associated pars defect. No acute osseous abnormality or suspicious osseous lesion. Additional mild degenerative changes in the hips and SI joints. IMPRESSION: 1. No definite acute abnormality seen in the chest, abdomen or pelvis. Specifically no convincing CT features of acute pancreatitis. Elevation of the D-dimer is nonspecific and exclusion of a pulmonary embolus is precluded on this exam given the absence of intravenous contrast media. 2. Extensive multicystic appearance of the liver and kidneys with varying degrees of size and complexity within the renal cysts most compatible with autosomal dominant polycystic kidney disease. Appearance of several complex and hyperdense cyst is grossly unchanged from prior. 3. Mild dilatation of the ascending thoracic aorta to 4.4 cm with return to a normal aortic caliber at the level of the distal arch measuring 2.6 cm. Recommend annual imaging followup by CTA or MRA. This recommendation follows 2010 ACCF/AHA/AATS/ACR/ASA/SCA/SCAI/SIR/STS/SVM Guidelines for the Diagnosis and Management of  Patients with Thoracic Aortic Disease. Circulation. 2010; 121: W237-S283. Aortic aneurysm NOS (ICD10-I71.9) 4. Coronary artery calcifications are present. Please note that the presence of coronary artery calcium documents the presence of coronary artery disease, the severity of this disease and any potential stenosis cannot be assessed on this non-gated CT examination. Assessment for potential risk factor modification, dietary therapy or pharmacologic therapy may be warranted. 5. Aortic Atherosclerosis (ICD10-I70.0). 6. Mild grade 1 anterolisthesis L4 on L5 without associated pars defect. 7. Mild prostatomegaly. 8. Paucity of subcutaneous and intraperitoneal fat, correlate with nutritional status. Electronically Signed   By: Lovena Le M.D.   On: 05/22/2019 18:29   CT Chest Wo Contrast  Result Date: 05/22/2019 CLINICAL DATA:  Epigastric and left chest pain, mildly elevated lipase (69), elevated D-dimer EXAM: CT CHEST, ABDOMEN AND PELVIS WITHOUT CONTRAST TECHNIQUE: Multidetector CT imaging of the chest, abdomen and pelvis was performed following the standard protocol without IV contrast. COMPARISON:  CT abdomen pelvis 04/20/2019, chest radiograph Jun 16, 2018, CT chest 09/23/2000 (report only, images unavailable) FINDINGS: CT CHEST FINDINGS Cardiovascular: The aortic root is suboptimally assessed given cardiac pulsation artifact. Gross preservation of the sino-tubular junction. Mild dilatation of the ascending thoracic aorta to 4.4 cm with return to a normal aortic caliber at the level of the distal arch measuring 2.6 cm. Mild atherosclerotic calcification of the thoracic aorta. No abnormal hyperdense mural thickening or plaque displacement to suggest intramural hematoma. No periaortic stranding or hemorrhage. Normal 3 vessel branching of the aortic arch with minimal calcification of the proximal great vessels. Central pulmonary arteries are normal caliber. Normal heart size. Trace pericardial fluid likely  within physiologic normal and unchanged from comparison CT. Extensive three-vessel coronary artery calcifications are noted. Luminal evaluation of the vasculature precluded in the absence of contrast media. Mediastinum/Nodes: No mediastinal fluid or gas. Normal thyroid gland and thoracic inlet. No acute abnormality of the trachea or esophagus. No worrisome mediastinal or axillary adenopathy. Hilar nodal evaluation is limited in the absence of intravenous contrast media. Lungs/Pleura: Bandlike area of opacity in the right lung base compatible with subsegmental atelectasis and/or scarring. More dependent atelectasis seen posteriorly in the lung bases. No consolidation, features of edema, pneumothorax, or effusion. No suspicious pulmonary nodules or masses. Musculoskeletal: Multilevel degenerative changes are present in the imaged portions of the spine. No acute osseous abnormality or suspicious osseous lesion. No chest wall mass or suspicious soft tissue lesions identified. Paucity of subcutaneous fat. CT ABDOMEN PELVIS FINDINGS Hepatobiliary: Redemonstration of the innumerable hypoattenuating foci throughout the liver, many of which  are too small to characterize but likely reflect benign hepatic cysts. Fluid attenuation cyst in the left lobe again measures up to 3.2 cm (2/54). No worrisome liver lesion visible within the unenhanced liver. Gallbladder is largely decompressed at the time of exam. No frank biliary ductal dilatation or visible calcified gallstones. Pancreas: Pancreas is anteriorly displaced by the multi-cystic renal parenchyma. No visible pancreatic lesions. No peripancreatic inflammation or evidence of ductal dilatation. Spleen: Spleen is displaced anteriorly. Otherwise unremarkable and normal size without focal splenic lesion. Adrenals/Urinary Tract: Adrenal glands are difficult to discern from the multi-cystic renal parenchyma. There are numerous bilateral renal cysts of varying size and complexity  including several intermediate attenuation foci in the upper pole right kidney (2/56, 62) and lower pole left kidney (2/96). Multiple areas of ill-defined calcification seen within the multi cystic renal parenchyma which could reflect septal calcification and or nonobstructing calculi. Difficult to discern the normal renal parenchyma and collecting system. No visible calcifications along the expected course of either ureter nor visible lodged within the otherwise normal urinary bladder. Some mild indentation of the posterior bladder by the enlarged prostate. Stomach/Bowel: Technically difficult assessment of the bowel due to a paucity of intraperitoneal fat and the large multi cystic kidneys. Distal esophagus and stomach are free of acute abnormality. Duodenal sweep crosses the midline abdomen. Much of the large and small bowel is displaced anteriorly. No gross bowel wall thickening, dilatation, or evidence of obstruction. The appendix is not identified. Vascular/Lymphatic: Atherosclerotic plaque within the normal caliber aorta. Evaluation of the lymph nodes is limited given a paucity of intraperitoneal fat and lack of intravenous contrast material. No gross adenopathy is appreciable. Reproductive: Coarse eccentric calcification of the mildly enlarged prostate. No concerning abnormalities of the prostate or seminal vesicles. Benign calcifications of the penile corpora. Other: No abdominopelvic free fluid or free gas. No bowel containing hernias. Paucity of intraperitoneal and subcutaneous fat. Musculoskeletal: Multilevel degenerative changes are present in the imaged portions of the spine. Mild grade 1 anterolisthesis L4 on L5 without associated pars defect. No acute osseous abnormality or suspicious osseous lesion. Additional mild degenerative changes in the hips and SI joints. IMPRESSION: 1. No definite acute abnormality seen in the chest, abdomen or pelvis. Specifically no convincing CT features of acute  pancreatitis. Elevation of the D-dimer is nonspecific and exclusion of a pulmonary embolus is precluded on this exam given the absence of intravenous contrast media. 2. Extensive multicystic appearance of the liver and kidneys with varying degrees of size and complexity within the renal cysts most compatible with autosomal dominant polycystic kidney disease. Appearance of several complex and hyperdense cyst is grossly unchanged from prior. 3. Mild dilatation of the ascending thoracic aorta to 4.4 cm with return to a normal aortic caliber at the level of the distal arch measuring 2.6 cm. Recommend annual imaging followup by CTA or MRA. This recommendation follows 2010 ACCF/AHA/AATS/ACR/ASA/SCA/SCAI/SIR/STS/SVM Guidelines for the Diagnosis and Management of Patients with Thoracic Aortic Disease. Circulation. 2010; 121: Z366-Y403. Aortic aneurysm NOS (ICD10-I71.9) 4. Coronary artery calcifications are present. Please note that the presence of coronary artery calcium documents the presence of coronary artery disease, the severity of this disease and any potential stenosis cannot be assessed on this non-gated CT examination. Assessment for potential risk factor modification, dietary therapy or pharmacologic therapy may be warranted. 5. Aortic Atherosclerosis (ICD10-I70.0). 6. Mild grade 1 anterolisthesis L4 on L5 without associated pars defect. 7. Mild prostatomegaly. 8. Paucity of subcutaneous and intraperitoneal fat, correlate with nutritional status. Electronically  Signed   By: Lovena Le M.D.   On: 05/22/2019 18:29   NM PULMONARY VENT AND PERF (V/Q Scan)  Result Date: 05/22/2019 CLINICAL DATA:  Epigastric and left-sided chest pain EXAM: NUCLEAR MEDICINE VENTILATION - PERFUSION LUNG SCAN TECHNIQUE: Ventilation images were obtained in multiple projections using inhaled aerosol Tc-60m DTPA. Perfusion images were obtained in multiple projections after intravenous injection of Tc-43m MAA. RADIOPHARMACEUTICALS:  40.9  mCi of Tc-92m DTPA aerosol inhalation and 1.6 mCi Tc85m MAA IV COMPARISON:  CT same day FINDINGS: Ventilation: There is a small ventilation defects seen within the right lower lobe and heights is artery to of a there neck and height is Dr. Lorenso Courier to the head high on the abdomen CT psychosis from same day cardiac CT or CT at yet number on the back left lower lobe. No other ventilation defects are seen. Perfusion: There is also a small perfusion defect seen within the right lower lobe and left lower lobe. No large perfusion defects are seen. IMPRESSION: Two matched small perfusion defects within the right lower lobe and left lower lobe with very low probability of pulmonary embolism based on PIOPED criteria. Electronically Signed   By: Prudencio Pair M.D.   On: 05/22/2019 22:01   DG Sacrum/Coccyx  Result Date: 10/05/2019 CLINICAL DATA:  Fall, persistent tailbone pain EXAM: SACRUM AND COCCYX - 2+ VIEW COMPARISON:  CT abdomen pelvis 05/22/2019 FINDINGS: Suboptimal frontal projection due to overlying bowel gas and body habitus. Visible arcuate lines are grossly intact. Lateral view without discernible cortical step-off or significant new traumatic malalignment though resolution of the images is again limited by the patient body habitus. Some mild posterior soft tissue thickening is noted which may reflect some mild contusive change. Degenerative changes noted in the hips and SI joints. Additional degenerative changes in the lower lumbar spine. Grade 1 anterolisthesis L4 on 5 is present, similar to comparison CT. IMPRESSION: 1. No convincing radiographic evidence of acute osseous injury. 2. Mild posterior soft tissue thickening may reflect some mild contusive change. 3. Degenerative changes throughout the spine, pelvis and hips. Electronically Signed   By: Lovena Le M.D.   On: 10/05/2019 19:52    Assessment & Plan:   Henry Adams was seen today for follow-up.  Diagnoses and all orders for this visit:  Traumatic  coccydynia- There may be some component of a recent contusion.  He will take Tylenol as needed for the discomfort. -     DG Sacrum/Coccyx; Future  Pressure injury of buttock, stage 1, unspecified laterality- I have recommended HHC and local treatment.  Irritable bowel syndrome with both constipation and diarrhea -     loperamide (IMODIUM A-D) 2 MG tablet; Take 1 tablet (2 mg total) by mouth 4 (four) times daily as needed for diarrhea or loose stools.  Flu vaccine need -     Flu Vaccine QUAD High Dose(Fluad)   I have discontinued Henry Adams. Henry Adams's glycopyrrolate. I am also having him start on loperamide. Additionally, I am having him maintain his fexofenadine, finasteride, acetaminophen, vitamin B-12, clonazePAM, omeprazole, alfuzosin, Probiotic Product ([place]), donepezil, Carbidopa-Levodopa ER, meclizine, FLUoxetine, memantine, and Polyethylene Glycol 3350 (MIRALAX PO).  Meds ordered this encounter  Medications  . loperamide (IMODIUM A-D) 2 MG tablet    Sig: Take 1 tablet (2 mg total) by mouth 4 (four) times daily as needed for diarrhea or loose stools.    Dispense:  90 tablet    Refill:  2     Follow-up: Return  if symptoms worsen or fail to improve.  Scarlette Calico, MD

## 2019-10-05 NOTE — Patient Instructions (Signed)
Tailbone Injury  The tailbone (coccyx) is the small bone at the lower end of the spine. A tailbone injury may involve stretched ligaments, bruising, or a broken bone (fracture). Tailbone injuries can be painful, and some may take a long time to heal. What are the causes? This condition may be caused by:  Falling and landing on the tailbone.  Repeated strain or friction from sitting for long periods of time. This may include actions such as rowing and bicycling.  Childbirth. In some cases, the cause may not be known. What are the signs or symptoms? Symptoms of this condition include:  Pain in the tailbone area or lower back, especially when sitting.  Pain or difficulty when standing up from a sitting position.  Bruising or swelling in the tailbone area.  Painful bowel movements.  In women, pain during intercourse. How is this diagnosed? This condition may be diagnosed based on:  Your symptoms.  A physical exam. If your health care provider suspects a fracture, you may have additional tests, such as:  X-rays.  CT scan.  MRI. How is this treated? Most tailbone injuries heal on their own in 4-6 weeks. However, recovery time may be longer if the injury involves a fracture. Treatment for this condition may include:  NSAIDs or other over-the-counter medicines to help relieve your pain.  Using a large, rubber or inflated ring or cushion to take pressure off the tailbone when sitting.  Physical therapy.  Injecting the tailbone area with local anesthesia and steroid medicine. This is not normally needed unless the pain does not improve over time with over-the-counter pain medicines. Follow these instructions at home: Activity  Avoid sitting for long periods of time.  To prevent repeating an injury that is caused by strain or friction: ? Wear appropriate padding and sports gear when bicycling and rowing.  Increase your activity as the pain allows. Perform any exercises  that are recommended by your health care provider or physical therapist. Managing pain, stiffness, and swelling  To help decrease discomfort when sitting: ? Sit on your rubber or inflated ring or cushion as told by your health care provider. ? Lean forward when you sit.  If directed, apply ice to the injured area: ? Put ice in a plastic bag. ? Place a towel between your skin and the bag. ? Leave the ice on for 20 minutes, 2-3 times per day for the first 1-2 days.  If directed, apply heat to the affected area as often as told by your health care provider. Use the heat source that your health care provider recommends, such as a moist heat pack or a heating pad. ? Place a towel between your skin and the heat source. ? Leave the heat on for 20-30 minutes. ? Remove the heat if your skin turns bright red. This is especially important if you are unable to feel pain, heat, or cold. You may have a greater risk of getting burned. General instructions  Take over-the-counter and prescription medicines only as told by your health care provider.  To prevent or treat constipation or painful bowel movements, your health care provider may recommend that you: ? Drink enough fluid to keep your urine pale yellow. ? Eat foods that are high in fiber, such as fresh fruits and vegetables, whole grains, and beans. ? Limit foods that are high in fat and processed sugars, such as fried and sweet foods. ? Take an over-the-counter or prescription medicine for constipation.  Keep all follow-up visits as   directed by your health care provider. This is important. Contact a health care provider if:  Your pain becomes worse or is not controlled with medicine.  Your bowel movements cause a great deal of discomfort.  You are unable to have a bowel movement after 4 days.  You have pain during intercourse. Summary  A tailbone injury may involve stretched ligaments, bruising, or a broken bone (fracture).  Tailbone  injuries can be painful. Most heal on their own in 4-6 weeks.  Treatment may include taking NSAIDs, using a rubber or inflated ring or cushion when sitting, and physical therapy.  Follow any recommendations from your health care provider to prevent or treat constipation. This information is not intended to replace advice given to you by your health care provider. Make sure you discuss any questions you have with your health care provider. Document Revised: 02/05/2017 Document Reviewed: 02/05/2017 Elsevier Patient Education  2020 [employer]  

## 2019-10-06 ENCOUNTER — Encounter: Payer: Self-pay | Admitting: Internal Medicine

## 2019-10-14 ENCOUNTER — Telehealth: Payer: Self-pay | Admitting: Internal Medicine

## 2019-10-14 NOTE — Telephone Encounter (Signed)
Verbal order given via VM

## 2019-10-14 NOTE — Telephone Encounter (Signed)
    Santiago Glad from [facility] requesting verbal order: delay start of care/ nursing to 9/28. The family has requesting starting next week  Phone [phone], ok to leave message

## 2019-10-21 ENCOUNTER — Other Ambulatory Visit: Payer: Self-pay | Admitting: Internal Medicine

## 2019-10-21 ENCOUNTER — Telehealth: Payer: Self-pay | Admitting: Emergency Medicine

## 2019-10-21 DIAGNOSIS — L89301 Pressure ulcer of unspecified buttock, stage 1: Secondary | ICD-10-CM

## 2019-10-21 MED ORDER — CALMOSEPTINE 0.44-20.6 % EX OINT: 1.0000 | TOPICAL_OINTMENT | Freq: Every day | CUTANEOUS | 3 refills | Status: AC

## 2019-10-21 NOTE — Telephone Encounter (Signed)
Santiago Glad with [facility] went out to see patient. There was no skilled need so they did not set up services. The home is asking if calmoseptine skin barrier cream could be called in for patient.  Thanks.

## 2019-11-09 ENCOUNTER — Other Ambulatory Visit: Payer: Self-pay | Admitting: Neurology

## 2019-11-09 NOTE — Telephone Encounter (Signed)
Rx(s) sent to pharmacy electronically.  

## 2019-11-12 ENCOUNTER — Encounter: Payer: Self-pay | Admitting: Internal Medicine

## 2019-11-19 ENCOUNTER — Ambulatory Visit (INDEPENDENT_AMBULATORY_CARE_PROVIDER_SITE_OTHER): Payer: Medicare Other | Admitting: Psychiatry

## 2019-11-19 ENCOUNTER — Encounter: Payer: Self-pay | Admitting: Psychiatry

## 2019-11-19 ENCOUNTER — Other Ambulatory Visit: Payer: Self-pay

## 2019-11-19 DIAGNOSIS — G2581 Restless legs syndrome: Secondary | ICD-10-CM | POA: Diagnosis not present

## 2019-11-19 DIAGNOSIS — F5105 Insomnia due to other mental disorder: Secondary | ICD-10-CM

## 2019-11-19 DIAGNOSIS — G20 Parkinson's disease: Secondary | ICD-10-CM | POA: Diagnosis not present

## 2019-11-19 DIAGNOSIS — G4721 Circadian rhythm sleep disorder, delayed sleep phase type: Secondary | ICD-10-CM

## 2019-11-19 DIAGNOSIS — F331 Major depressive disorder, recurrent, moderate: Secondary | ICD-10-CM | POA: Diagnosis not present

## 2019-11-19 DIAGNOSIS — F0280 Dementia in other diseases classified elsewhere without behavioral disturbance: Secondary | ICD-10-CM

## 2019-11-19 NOTE — Progress Notes (Signed)
Henry Adams [id number] Apr 21, 1940 79 y.o.  Subjective:   Patient ID:  Henry Adams is a 79 y.o. (DOB November 28, 1940) male.  Chief Complaint:  Chief Complaint  Patient presents with  . Follow-up  . Anxiety  . Agitation  . Memory Loss  . Sleeping Problem    Depression        Associated symptoms include decreased concentration and fatigue.  Henry Adams presents to the office today for follow-up of depression.    First seen Oct 28 started Trintellix 5. Couldn't tolerate it DT dizziness and GI pain.  Resolved off it. Switched to duloxetine 12/6 20 mg daily.  seen January 01, 2019 with daughter present.  We continued recently started duloxetine 20 mg daily and added Ritalin 5 mg twice daily for augmentation and cognitive reasons.D stopped duloxetine 12/20 bc confusion which has gotten better 50% better but still depressed.   seen January 29, 2019 they were encouraged to try Ritalin augmentation for depression at 5 mg twice daily.  February 24, 2019 is daughter Henry Adams called stating he was "more confused, angry, and upset".  The only med change has been the addition of Ritalin and she was encouraged to stop that.  Patient was taking clonazepam 0.5 mg nightly and the option was offered to increase that to 0.75 mg nightly if needed  Last seen February 26, 2019.Henry Adams  Ritalin had been stopped due to agitation. Yesterday was first day off Ritalin and agitation was better, but evening is associated with anxiety and confusion.   It was having sleep issues and the following was changed: Increase to 1 and 1/2 clonazepam of 0.5 mg at night for improving sleep.  Daughter called February 12 noting more cognitive decline.  Stating he needs something to calm him down because of anxiety and agitation.  She was given the following information: At his last visit February 19, 2019 he was having some sleep issues and we increase clonazepam from 0.5 mg nightly to 0.5 mg tablets 1-1/2 nightly.  That is a sedative.  I  would prefer not to mix sedatives if possible.  Therefore use clonazepam 0.5 mg tablets 1/2 to 1 tablet daily during the day in addition to the nighttime dose as needed for agitation.  As of visit 04/09/19 comes with daughter for history. [name]. Dizzy in AM and balance problems with clonazepam 0.75 mg HS. Tolerated clonazepam 0.25 mg added at 3 pm without dizziness and it helped calm him.   "I feel pretty good" today. Clonazepam added at 3 PM did resolve the agitation in the evening so far and is tolerated.  He is not markedly depressed at this time. Moved to independent living apt January 2021. Going to asst living [facility] [place] next week.   Has been difficult with a lot of anxiety over it.  Feels overwhelmed with expectations.  Calling D in middle of night confused. He doesn't remember doing so.  Pt denies being nervous there. Huge changes per D in last 3-4 weeks with greater irritability. [place]. Goes to bed about 930.   Not deeply depressed nor anxious.  Low energy.  Parkinson's interferes with normal activities.  Enjoys family activities.  Not reading much but can.  Watches some TV but less than in the past. Plan: increase Aricept to 10  07/10/19 appointment the following is noted:   Resident [place] and treated OK except the dining staff. He thinks relatively he's doing OK.  Usually sleep is OK. No problems with increase  donepezil 10 mg.   D concerned he's not sleeping a lot at night.  Little TV watching. Some EMA.  Up for the day by 6 AM.   RLS is managed usually but he told D he had a problem last night but overall "tremendous improvement".  Walking 2 times daily. Some depression.  Very interest and can get excited but can't do what he used to do.  Parkinson's interferes with execution of what he wants to do.  Appetite is good.  Conc and memory problems.    No hallucinations lately.   D notes negative interpretation of events. Started fluoxetine 10 mg  daily  09/10/19 appt with following noted:  Seen with D Arrived 17 min late. Had bout of diarrhea after starting fluoxetine and stopped then restarted 8/4.   No diarrhea in the last couple of weeks. D notes lately has been fearful thinking people are spying on him and he considered calling the police.  At times doesn't want to take meds.  Being worried about what he says.   D asks about Namenda. Resident [place] Plan: Fluoxetine 10 mg in AM give it more time.  If suspect it's contributing to paranoia stop it. They tried reducing the clonazepam but his confusion did not get better. Still would suggest try to stop 3 pm dose of clonazepam for cognitive reasons.  It's been used for RLS and agitation.  Trial Namenda for cognition  2019-11-19 appointment with the following noted: Seen with daughter Trouble sleeping again and day and night time mixed up.  Otherwise says he feels OK. Recognizes hard to go to sleep but no RLS noted or other causes. Did get memantine started and able to reduce clonazepam to 0.5 mg HS only. No SE noted with changes.  Past Psychiatric History: History of Lexapro 20 helped awhile but partially effective for anxiety and depression  Trintellix SE after 4-5 week without benefit.  Duloxetine 20, History Wellbutrin XL 300,  Ritalin 5 BID Namenda 10 BID Clonazepam helped anxiety and RLS and sleep   trazodone for insomnia and didn't help the RLS. History of Seroquel 25 Rx.  Never taken. Melatonin dizzy  Review of Systems:  Review of Systems  Constitutional: Positive for fatigue.  HENT: Positive for voice change.   Gastrointestinal: Positive for constipation. Negative for abdominal distention, diarrhea and nausea.  Neurological: Positive for tremors and weakness. Negative for dizziness and light-headedness.  Psychiatric/Behavioral: Positive for confusion, decreased concentration, depression and dysphoric mood.    Medications: I have reviewed the patient's current  medications.  Current Outpatient Medications  Medication Sig Dispense Refill  . acetaminophen (TYLENOL) 500 MG tablet 500 mg prn for back, knee, shoulder or head pain. May be given up to every 6 hours. 90 tablet 3  . alfuzosin (UROXATRAL) 10 MG 24 hr tablet Take 1 tablet (10 mg total) by mouth 2 (two) times daily. 10 mg at 7 am and 7 pm daily.    . Carbidopa-Levodopa ER (SINEMET CR) 25-100 MG tablet controlled release TAKE ONE TABLET AT BEDTIME. 90 tablet 0  . clonazePAM (KLONOPIN) 0.5 MG tablet Take 1/2-1 tablet by mouth during the day as needed for anxiety/agitation and take 1.5 tablets at bedtime as needed for sleep/anxiety (Patient taking differently: Take 0.5 mg by mouth at bedtime. ) 75 tablet 0  . donepezil (ARICEPT) 10 MG tablet Take 1 tablet (10 mg total) by mouth at bedtime. 90 tablet 1  . fexofenadine (ALLEGRA) 180 MG tablet 180mg  tablet at 11 am. (Patient  taking differently: Take 180 mg by mouth daily. 11 am.) 90 tablet 3  . finasteride (PROSCAR) 5 MG tablet 5mg  at 7pm daily (Patient taking differently: Take 5 mg by mouth every evening. 7pm) 90 tablet 3  . FLUoxetine (PROZAC) 10 MG capsule Take 1 capsule (10 mg total) by mouth daily. 30 capsule 1  . loperamide (IMODIUM A-D) 2 MG tablet Take 1 tablet (2 mg total) by mouth 4 (four) times daily as needed for diarrhea or loose stools. 90 tablet 2  . meclizine (ANTIVERT) 12.5 MG tablet Take 1 tablet (12.5 mg total) by mouth 3 (three) times daily as needed. 90 tablet 2  . memantine (NAMENDA TITRATION PAK) tablet pack 5 mg/day for =1 week; 5 mg twice daily for =1 week; 15 mg/day given in 5 mg and 10 mg separated doses for =1 week; then 10 mg twice daily 49 tablet 12  . Menthol-Zinc Oxide (CALMOSEPTINE) 0.44-20.6 % OINT Apply 1 Act topically daily. 113 g 3  . omeprazole (PRILOSEC) 40 MG capsule TAKE (1) CAPSULE DAILY. (Patient taking differently: Take 40 mg by mouth daily. ) 90 capsule 0  . Polyethylene Glycol 3350 (MIRALAX PO) Take by mouth.     . Probiotic Product ([place]unda) Take by mouth.    . vitamin B-12 (CYANOCOBALAMIN) 1000 MCG tablet 1000 mcg tablet at 11 am daily. (Patient taking differently: Take 1,000 mcg by mouth daily. ) 90 tablet 3   No current facility-administered medications for this visit.    Medication Side Effects: None obvious  Allergies:  Allergies  Allergen Reactions  . Iodine     rash  . Oxycodone     Mental status changes & nausea  . Pollen Extract Itching  . Tamiflu [Oseltamivir Phosphate] Other (See Comments)    hallucinations  . Tamsulosin     Other reaction(s): Other (See Comments) Unknown   . Doxazosin Rash    agitation  . Etodolac Rash    unknown  . Tape Rash    Certain "Band-Aids"    Past Medical History:  Diagnosis Date  . Allergy    perennial  . Anxiety   . Arthritis   . Cholelithiasis   . Chronic idiopathic constipation   . CKD (chronic kidney disease)   . Depression   . Diverticulosis   . Enlarged prostate   . Gallstones   . GERD (gastroesophageal reflux disease)   . HLD (hyperlipidemia)   . Hx of cardiovascular stress test    ETT-Myoview 6/14: probable low risk study, inf defect with normal inf wall motion-cannot r/o ischemia; prominent gut uptake adjacent to inf wall on rest images-poss defect is atten, EF 63%  . Hypertension   . Melanoma ([place])   . Parkinsonism ([place])   . Polycystic kidney disease     Family History  Problem Relation Age of Onset  . Hypertension Father   . Cancer Brother        mouth  . Cancer Sister        breast  . Heart disease Brother        heart transplant  . Cancer Daughter        breast  . Stroke Sister        >57  . Skin cancer Brother        squamous cell  . Colon cancer Neg Hx     Social History   Socioeconomic History  . Marital status: Married    Spouse name: Not on file  .  Number of children: 2  . Years of education: Not on file  . Highest education level: Not on file  Occupational History  .  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 Smoking status: Never Smoker  . Smokeless tobacco: Never Used  Vaping Use  . Vaping Use: Never used  Substance and Sexual Activity  . Alcohol use: Not Currently    Alcohol/week: 0.0 standard drinks  . Drug use: No  . Sexual activity: Not on file  Other Topics Concern  . Not on file  Social History Narrative  . Not on file   Social Determinants of Health   Financial Resource Strain:   . Difficulty of Paying Living Expenses: Not on file  Food Insecurity:   . Worried About Charity fundraiser in the Last Year: Not on file  . Ran Out of Food in the Last Year: Not on file  Transportation Needs:   . Lack of Transportation (Medical): Not on file  . Lack of Transportation (Non-Medical): Not on file  Physical Activity:   . Days of Exercise per Week: Not on file  . Minutes of Exercise per Session: Not on file  Stress:   . Feeling of Stress : Not on file  Social Connections:   . Frequency of Communication with Friends and Family: Not on file  . Frequency of Social Gatherings with Friends and Family: Not on file  . Attends Religious Services: Not on file  . Active Member of Clubs or Organizations: Not on file  . Attends Archivist Meetings: Not on file  . Marital Status: Not on file  Intimate Partner Violence:   . Fear of Current or Ex-Partner: Not on file  . Emotionally Abused: Not on file  . Physically Abused: Not on file  . Sexually Abused: Not on file    Past Medical History, Surgical history, Social history, and Family history were reviewed and updated as appropriate.   Please see review of systems for further details on the patient's review from today.   Objective:   Physical Exam:  There were no vitals taken for this visit.  Physical Exam Constitutional:      Appearance: Normal appearance. He is normal weight.  Neurological:     Mental Status: He is alert and oriented  to person, place, and time.     Motor: Weakness and tremor present.     Coordination: Coordination abnormal.     Gait: Gait abnormal.     Comments: Short gait.  Psychiatric:        Attention and Perception: He does not perceive auditory or visual hallucinations.        Mood and Affect: Mood is depressed. Mood is not anxious or elated. Affect is blunt. Affect is not tearful.        Speech: Speech is delayed.        Behavior: Behavior is slowed.        Thought Content: Thought content is not paranoid. Thought content does not include homicidal or suicidal ideation.        Cognition and Memory: Cognition is impaired. Memory is impaired. He exhibits impaired recent memory.     Comments: Word finding problems and appears to confabulate A little more depressed. Insight and judgment fair. Slowed responses significantly. Reduced volume and hard to understand Responses intermittently make no sense.    Loses track of thoughts at time  Alert.  Lab Review:     Component Value Date/Time   NA 138 08/03/2019 1408   K 3.8 08/03/2019 1408   CL 104 08/03/2019 1408   CO2 27 08/03/2019 1408   GLUCOSE 104 (H) 08/03/2019 1408   GLUCOSE 105 (H) 12/03/2005 0000   BUN 22 08/03/2019 1408   CREATININE 1.39 (H) 08/03/2019 1408   CALCIUM 8.8 08/03/2019 1408   PROT 7.0 05/21/2019 1355   ALBUMIN 3.9 05/21/2019 1355   AST 16 05/21/2019 1355   ALT 8 05/21/2019 1355   ALKPHOS 72 05/21/2019 1355   BILITOT 0.9 05/21/2019 1355   GFRNONAA 53 (L) 05/22/2019 1421   GFRAA >60 05/22/2019 1421       Component Value Date/Time   WBC 5.5 08/03/2019 1408   RBC 3.81 (L) 08/03/2019 1408   HGB 12.3 (L) 08/03/2019 1408   HCT 36.6 (L) 08/03/2019 1408   PLT 140 08/03/2019 1408   MCV 96.1 08/03/2019 1408   MCH 32.3 08/03/2019 1408   MCHC 33.6 08/03/2019 1408   RDW 12.5 08/03/2019 1408   LYMPHSABS 1,375 08/03/2019 1408   MONOABS 0.7 05/21/2019 1355   EOSABS 138 08/03/2019 1408   BASOSABS 61 08/03/2019 1408    neuropsych testing Dr. Melvyn Novas 08/2018 [facility]  No results found for: POCLITH, LITHIUM   No results found for: PHENYTOIN, PHENOBARB, VALPROATE, CBMZ   .res Assessment: Plan:    Toney was seen today for follow-up, anxiety, agitation, memory loss and sleeping problem.  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 We discussed the complex and the relationship between depression,  And discussion of sleep.  Parkinson's disease including the cognitive complications of it.  These are all interrelated in his case.  He is cognitively slow and obviously physically slow due to the Parkinson's disease. It appears cognition is worse and more paranoid, more forgetful in [facility].   It is difficult to tell how much depression contributes but he does not acknowledge severe depression at this time.  More paranoia noted this visit.  Not on fluoxetine long enough to see benefit but overall prognosis is poor.    Residual depression but not spontaneously voicing depression today.  Consider more noradrenergic antidepressant like Pristiq. Or easier to use would be fluoxetine bc less withdrawal concerns and history partial benefit with Lexapro. Fluoxetine 10 mg in AM continued.  Semi-Successful reduction of clonazepam to 0.5 mg HS but with more irregular sleep patterns noted.  Therefore switching BZ probably won't help confusion which is probably Parkinson's dementia at this time.  We discussed the short-term risks associated with benzodiazepines including sedation and increased fall risk among others.  Discussed long-term side effect risk including dependence, potential  withdrawal symptoms, and the potential eventual dose-related risk of dementia. He did not tolerate clonazepam 0.75 mg nightly because of morning dizziness. Option Dayvigo or Belsomra for sleep cycle problems.    Continue donepezil.  Likely moved from [facility] to early dementia from PD Continue Namenda to do all we can for cognition.  D feels there has been more clarity of thought lately.  Encouraged physical, social, and mental stimulation to help offset some of his symptoms as well.  The treatment  plan was discussed with both the patient and his daughter.    NO MED CHANGES TODAY  rEVIEWED MED LIST FROM [facility]  FU 3 mos  Lynder Parents, MD, DFAPA    Please see After Visit Summary for patient specific instructions.  Future Appointments  Date Time Provider [place]  11/30/2019 11:15 AM Tat, Eustace Quail, DO [id number] None    No orders of the defined types were placed in this encounter.   -------------------------------

## 2019-11-26 NOTE — Progress Notes (Signed)
Assessment/Plan:   1.  Parkinsonism             -Take carbidopa/levodopa 25/100 CR, but change timing to 1 tablet at 6 AM/10 AM/2 PM and 1.5 tablets at 6 PM, 1 at bedtime  -RX for PT in the home (leg[facility] PT)  -encouraged exercise at [place]  -walker at all times   2. Peripheral neuropathy with hx B12 def -on supplement  3. Depressionand anxiety -seeing Dr. Clovis Pu -think that this is one of the primary issues affecting health  4.RLS -Continue clonazepam, 0.5 mg at bedtime.             -Clonazepam now being prescribed by psychiatry  5.Constipation -following with GI -increase hydration  6.Mild cognitive impairment, likely PDD now -Patient had neurocognitive testing with Dr. Melvyn Novas in August, 2020 with evidence of mild cognitive impairment.  Testing was similar to that in May, 2019.    -on donepezil and memantine (RX Dr. Clovis Pu)  -some delusions now but mild Subjective:   Henry Adams was seen today in follow up for Parkinsons disease.  My previous records were reviewed prior to todays visit as well as outside records available to me. Pt with daughter who supplements hx.  Pt last saw Dr. Clovis Pu on October 28.  Records reviewed.  Memantine was added by Dr. Clovis Pu since last visit.  Daughter thinks that there has been some cognitive improvement.  He has had some trouble over the last few days with the time change.  Some sleep trouble.  He is walking for exercise per patient but daughter states that he hasn't been doing a lot.  No hallucinations but he describes some delusions in the day.  Daughter would like to try and adjust meds to start at 6am as he is up that early.    Current prescribed movement disorder medications: carbidopa/levodopa 25/100 CR, 1 tablet at 7 AM/11 AM/3 PM and 1.5 tablets at 7 PM to see if will help cognition at all.  carbidopa/levodopa 25/100 CR at  bedtime Donepezil 10 mg daily (Dr. Clovis Pu) Memantine (Dr. Clovis Pu) Clonazepam 0.5 mg at bedtime   PREVIOUS MEDICATIONS: carbidopa/levodopa 50/200 at bedtime (patient did not think it helped and thought had more side effects)  Past Psychiatric History from Dr. Cottle:History of Lexapro 20helped awhile but partially effective for anxiety and depression  Trintellix SEafter 4-5 week without benefit. Duloxetine 20 Ritalin 5 BID Clonazepam helped anxiety and RLS and sleep History Wellbutrin XL 300,  trazodone for insomniaand didn't help the RLS. History of Seroquel 25Rx. Never taken. Melatonin dizzy ALLERGIES:   Allergies  Allergen Reactions  . Iodine     rash  . Oxycodone     Mental status changes & nausea  . Pollen Extract Itching  . Tamiflu [Oseltamivir Phosphate] Other (See Comments)    hallucinations  . Tamsulosin     Other reaction(s): Other (See Comments) Unknown   . Doxazosin Rash    agitation  . Etodolac Rash    unknown  . Tape Rash    Certain "Band-Aids"    CURRENT MEDICATIONS:  Outpatient Encounter Medications as of 11/30/2019  Medication Sig  . acetaminophen (TYLENOL) 500 MG tablet 500 mg prn for back, knee, shoulder or head pain. May be given up to every 6 hours.  [name] alfuzosin (UROXATRAL) 10 MG 24 hr tablet Take 1 tablet (10 mg total) by mouth 2 (two) times daily. 10 mg at 7 am and 7 pm daily.  . Carbidopa-Levodopa ER (SINEMET CR)  25-100 MG tablet controlled release TAKE ONE TABLET AT BEDTIME.  . Carbidopa-Levodopa ER (SINEMET CR) 25-100 MG tablet controlled release Take 1 tablet by mouth. Take 1 tab at 6am/10am/2pm/ 1.5 tab at 6pm  . clonazePAM (KLONOPIN) 0.5 MG tablet Take 1/2-1 tablet by mouth during the day as needed for anxiety/agitation and take 1.5 tablets at bedtime as needed for sleep/anxiety (Patient taking differently: Take 0.5 mg by mouth at bedtime. )  . donepezil (ARICEPT) 10 MG tablet Take 1 tablet (10 mg total) by mouth at bedtime.  .  fexofenadine (ALLEGRA) 180 MG tablet 180mg  tablet at 11 am. (Patient taking differently: Take 180 mg by mouth daily. 11 am.)  . finasteride (PROSCAR) 5 MG tablet 5mg  at 7pm daily (Patient taking differently: Take 5 mg by mouth every evening. 7pm)  . FLUoxetine (PROZAC) 10 MG capsule Take 1 capsule (10 mg total) by mouth daily.  . Menthol-Zinc Oxide (CALMOSEPTINE) 0.44-20.6 % OINT Apply 1 Act topically daily.  [name] omeprazole (PRILOSEC) 40 MG capsule TAKE (1) CAPSULE DAILY. (Patient taking differently: Take 40 mg by mouth daily. )  . Polyethylene Glycol 3350 (MIRALAX PO) Take by mouth.  . Probiotic Product ([place]) Take by mouth.  . vitamin B-12 (CYANOCOBALAMIN) 1000 MCG tablet 1000 mcg tablet at 11 am daily. (Patient taking differently: Take 1,000 mcg by mouth daily. )  . [DISCONTINUED] memantine (NAMENDA TITRATION PAK) tablet pack 5 mg/day for =1 week; 5 mg twice daily for =1 week; 15 mg/day given in 5 mg and 10 mg separated doses for =1 week; then 10 mg twice daily  . memantine (NAMENDA) 10 MG tablet Take 10 mg by mouth 2 (two) times daily.  . [DISCONTINUED] loperamide (IMODIUM A-D) 2 MG tablet Take 1 tablet (2 mg total) by mouth 4 (four) times daily as needed for diarrhea or loose stools. (Patient not taking: Reported on 11/30/2019)  . [DISCONTINUED] meclizine (ANTIVERT) 12.5 MG tablet Take 1 tablet (12.5 mg total) by mouth 3 (three) times daily as needed. (Patient not taking: Reported on 11/30/2019)   No facility-administered encounter medications on file as of 11/30/2019.    Objective:   PHYSICAL EXAMINATION:    VITALS:   Vitals:   11/30/19 1114 11/30/19 1153  BP: (!) 171/85 (!) 142/80  Pulse: 70   SpO2: 97%   Weight: 167 lb (75.8 kg)   Height: 5\' 11"  (1.803 m)     GEN:  The patient appears stated age and is in NAD. HEENT:  Normocephalic, atraumatic.  The mucous membranes are moist. The superficial temporal arteries are without ropiness or tenderness. CV:  RRR Lungs:   CTAB Neck/HEME:  There are no carotid bruits bilaterally.  Neurological examination:  Orientation: The patient is alert and oriented x3. Cranial nerves: There is good facial symmetry with facial hypomimia. The speech is fluent and clear. Soft palate rises symmetrically and there is no tongue deviation. Hearing is intact to conversational tone. Sensation: Sensation is intact to light touch throughout Motor: Strength is at least antigravity x4.  Movement examination: Tone: There is nl tone in the ue/le Abnormal movements: none Coordination:  There is mild decremation with RAM's Gait and Station: The patient has difficulty arising out of a deep-seated chair without the use of the hands and requires assist from daughter. The patient's stride length is decreased and he is shuffling.   I have reviewed and interpreted the following labs independently    Chemistry      Component Value Date/Time  NA 138 08/03/2019 1408   K 3.8 08/03/2019 1408   CL 104 08/03/2019 1408   CO2 27 08/03/2019 1408   BUN 22 08/03/2019 1408   CREATININE 1.39 (H) 08/03/2019 1408      Component Value Date/Time   CALCIUM 8.8 08/03/2019 1408   ALKPHOS 72 05/21/2019 1355   AST 16 05/21/2019 1355   ALT 8 05/21/2019 1355   BILITOT 0.9 05/21/2019 1355       Lab Results  Component Value Date   WBC 5.5 08/03/2019   HGB 12.3 (L) 08/03/2019   HCT 36.6 (L) 08/03/2019   MCV 96.1 08/03/2019   PLT 140 08/03/2019    Lab Results  Component Value Date   TSH 1.36 06/25/2018     Total time spent on today's visit was 30 minutes, including both face-to-face time and nonface-to-face time.  Time included that spent on review of records (prior notes available to me/labs/imaging if pertinent), discussing treatment and goals, answering patient's questions and coordinating care.  Cc:  [name], MD

## 2019-11-30 ENCOUNTER — Encounter: Payer: Self-pay | Admitting: Neurology

## 2019-11-30 ENCOUNTER — Ambulatory Visit: Payer: Medicare Other | Admitting: Neurology

## 2019-11-30 ENCOUNTER — Other Ambulatory Visit: Payer: Self-pay

## 2019-11-30 VITALS — BP 142/80 | HR 70 | Ht 71.0 in | Wt 167.0 lb

## 2019-11-30 DIAGNOSIS — G20 Parkinson's disease: Secondary | ICD-10-CM | POA: Diagnosis not present

## 2019-11-30 DIAGNOSIS — F331 Major depressive disorder, recurrent, moderate: Secondary | ICD-10-CM | POA: Diagnosis not present

## 2019-11-30 NOTE — Patient Instructions (Addendum)
1.  Take carbidopa/levodopa 25/100 CR, but change timing to 1 tablet at 6 AM/10 AM/2 PM and 1.5 tablets at 6 PM and continue the 1 at bedtime 2.  Use walker at all times!  Parkinsons [employer]   . Local [place] Online Groups  o Power over [employer] Group :   - Power Over Parkinson's Patient Education Group will be Wednesday, November 10th at 2pm via [place].   - Upcoming Power over Parkinson's Meetings:  2nd Wednesdays of the month at 2 pm:       December 8th, January 12th - Amy Marriott, PT at [facility] has resumed the lead of this group starting in July.  Contact Amy at [email or web address] if interested in participating in this online group o Parkinson's Care Partners Group:    3rd Mondays, Contact Corwin Levins o Atypical Parkinsonian Patient Group:   4th Wednesdays, Contact Corwin Levins o If you are interested in participating in these online groups with Judson Roch, please contact her directly for how to join those meetings.  Her contact information is [email or web address].  She will send you a link to join the [employer].  (Please note that Corwin Levins , MSW, LCSW, has resigned her position at [facility], but will continue to lead the online groups temporarily) .  [name] [place]:  [email or web address] o PD Health at Home continues:  Mindfulness Mondays, Expert Briefing Tuesdays, Wellness Wednesdays, Take Time Thursdays, Fitness Fridays  o Upcoming Webinar:  The Skinny on Skin and Bone Health in Parkinson's.  Wednesday, December 1st at 1 pm o Please check out their website to sign up for emails and see their full online offerings .  [name] [place]:  [email or web address]  o Upcoming Webinar:   Steps Closer to Stopping Parkinson's:  2021 Research Review, Thursday, November 18th at 12 noon. o Check out additional information on their website to see their full online offerings .  [name] [place]:   [email or web address] o Upcoming Webinar:  Non-Motor Symptom Medications in Parkinson's.  Wednesday, November 10th at 2 pm. o Care Partner Monthly Meetup.  With Robin Searing [place].  First Tuesday of each month, 2 pm o Check out additional information to Live Well Today on their website .  [name] Parkinson and Movement Disorders (PMD) Alliance:  www.pmdalliance.org o [employer] Online:  Online Education Events o Sign up for emails, which are sent weekly to give you updates on programming and online offerings .  [name] Parkinson's Association of the [place]:  www.[email or web address] o Information on online support groups, online exercises including Yoga, Parkinson's exercises and more-LOTS of information on links to PD resources and online events o Virtual Support Group through Parkinson's Association of the [place]; next one is scheduled for Wednesday, December 23, 2019 at 2 pm. (These are typically scheduled for the 1st Wednesday of the month at 2 pm).  Visit website for details. .  . Additional links for movement activities: o PWR! Moves Classes at [place] RESUMED, at a limited capacity.  We have several openings for Wednesday 10 am and 11 am classes.  Contact Amy Marriott, PT [email or web address] or [phone] if interested o Here is a link to the PWR!Moves classes on Zoom from [place] - Daily Mon-Sat at 10:00. Via Zoom, FREE and open to all.  There is also a link below via Facebook if you use that platform. - [email or web address].si - [email or web address]://www.[email or web address].no o Parkinson's Wellness Recovery (PWR! Moves)  www.pwr4life.org - Info on the PWR!  Virtual Experience:  You will have access to our expertise through self-assessment, guided plans that start with the  PD-specific fundamentals, educational content, tips, Q&A with an expert, and a growing Art therapist of PD-specific pre-recorded and live exercise classes of varying types and intensity - both physical and cognitive! If that is not enough, we offer 1:1 wellness consultations (in-person or virtual) to personalize your PWR! Research scientist (medical).  - Check out the PWR! Move of the month on the [place] [facility] website:  [email or web address] o [employer] Fridays:  - As part of the PD Health @ Home program, this free video series focuses each week on one aspect of fitness designed to support people living with Parkinson's.  -  HollywoodSale.dk o Dance for PD website is offering free, live-stream classes throughout the week, as well as links to [employer] of classes:  [email or web address] o Transport planner for Parkinson's Class:  [place] is back this Fall!  Free offering for people with Parkinson's and care partners; virtual class this Fall. The class will be Wednesdays 4-5pm beginning 10/13.  Classes will run for 9 weeks 10/13-12/15, with no class on 11/24.  Register below: o [email or web address]?[email or web address]&SEASON=Parkinsons+Dance-[email or web address]  o For more information, contact [phone] or email Ruffin Frederick at [email or web address] o Virtual dance and Pilates for Parkinson's classes: Click on the Community Tab> Parkinson's Movement Initiative Tab.  To register for classes and for more information, visit www.[email or web address] and click the "community" tab.  o [employer] Parkinson's Cycling Classes  - [facility] [employer]: 1pm on Fridays-Live classes at [facility] [employer] at [email or web address] or [phone]) [facility] [employer]: Virtual Classes Mondays and Thursdays (contact [place]  at [email or web address] or [phone]) .  o [employer] - Three levels of classes are offered Tuesdays and Thursdays:  10:30 am,  12 noon & 1:45 pm at [employer]. To observe a class or for  more information, call [phone] or email [email or web address] . Well[employer]Spring [employer]: o Chief Technology Officer Opportunities:  www.well-springsolutions.org/caregiver-education/caregiver-support-group.  You may also contact Vickki Muff at [email or web address] or [phone].   o Caregiver Virtual Event:   Well-Spring is Partnering with [employer], on Friday, November 19th from 11:30-12:30 for a virtual event - Contact 02-02-2000 (above) for details o Well-Spring Navigator:  Just1Navigator program, a free service to help individuals and families through the journey of determining care for older adults.  The "Navigator" is a Vickki Muff, Education officer, museum, who will speak with a prospective client and/or loved ones to provide an assessment of the situation and a set of recommendations for a personalized care plan -- all free of charge, and whether [facility] [employer] offers the needed service or not. If the need is not a service we provide, we are well-connected with reputable programs in town that we can refer you to.  www.well-springsolutions.org or to speak with the Navigator, call [phone].

## 2019-12-04 DIAGNOSIS — R262 Difficulty in walking, not elsewhere classified: Secondary | ICD-10-CM | POA: Diagnosis not present

## 2019-12-04 DIAGNOSIS — G20 Parkinson's disease: Secondary | ICD-10-CM | POA: Diagnosis not present

## 2019-12-04 DIAGNOSIS — R2681 Unsteadiness on feet: Secondary | ICD-10-CM | POA: Diagnosis not present

## 2019-12-07 DIAGNOSIS — G20 Parkinson's disease: Secondary | ICD-10-CM | POA: Diagnosis not present

## 2019-12-07 DIAGNOSIS — R262 Difficulty in walking, not elsewhere classified: Secondary | ICD-10-CM | POA: Diagnosis not present

## 2019-12-07 DIAGNOSIS — R2681 Unsteadiness on feet: Secondary | ICD-10-CM | POA: Diagnosis not present

## 2019-12-09 DIAGNOSIS — G20 Parkinson's disease: Secondary | ICD-10-CM | POA: Diagnosis not present

## 2019-12-09 DIAGNOSIS — R2681 Unsteadiness on feet: Secondary | ICD-10-CM | POA: Diagnosis not present

## 2019-12-09 DIAGNOSIS — R262 Difficulty in walking, not elsewhere classified: Secondary | ICD-10-CM | POA: Diagnosis not present

## 2019-12-16 DIAGNOSIS — R262 Difficulty in walking, not elsewhere classified: Secondary | ICD-10-CM | POA: Diagnosis not present

## 2019-12-16 DIAGNOSIS — R2681 Unsteadiness on feet: Secondary | ICD-10-CM | POA: Diagnosis not present

## 2019-12-16 DIAGNOSIS — G20 Parkinson's disease: Secondary | ICD-10-CM | POA: Diagnosis not present

## 2019-12-22 DIAGNOSIS — G20 Parkinson's disease: Secondary | ICD-10-CM | POA: Diagnosis not present

## 2019-12-22 DIAGNOSIS — R262 Difficulty in walking, not elsewhere classified: Secondary | ICD-10-CM | POA: Diagnosis not present

## 2019-12-22 DIAGNOSIS — R2681 Unsteadiness on feet: Secondary | ICD-10-CM | POA: Diagnosis not present

## 2019-12-24 DIAGNOSIS — R2681 Unsteadiness on feet: Secondary | ICD-10-CM | POA: Diagnosis not present

## 2019-12-24 DIAGNOSIS — G20 Parkinson's disease: Secondary | ICD-10-CM | POA: Diagnosis not present

## 2019-12-24 DIAGNOSIS — R262 Difficulty in walking, not elsewhere classified: Secondary | ICD-10-CM | POA: Diagnosis not present

## 2019-12-25 ENCOUNTER — Other Ambulatory Visit: Payer: Self-pay

## 2019-12-25 ENCOUNTER — Emergency Department (HOSPITAL_COMMUNITY)
Admission: EM | Admit: 2019-12-25 | Discharge: 2019-12-25 | Disposition: A | Discharge status: Home / Self Care | Payer: Medicare Other | Attending: Emergency Medicine | Admitting: Emergency Medicine

## 2019-12-25 ENCOUNTER — Ambulatory Visit (INDEPENDENT_AMBULATORY_CARE_PROVIDER_SITE_OTHER): Payer: Medicare Other | Admitting: Family

## 2019-12-25 ENCOUNTER — Encounter: Payer: Self-pay | Admitting: Family

## 2019-12-25 ENCOUNTER — Emergency Department (HOSPITAL_COMMUNITY): Payer: Medicare Other

## 2019-12-25 VITALS — BP 140/88 | HR 68 | Temp 98.7°F | Ht 71.0 in | Wt 167.0 lb

## 2019-12-25 DIAGNOSIS — M25561 Pain in right knee: Secondary | ICD-10-CM | POA: Diagnosis not present

## 2019-12-25 DIAGNOSIS — R079 Chest pain, unspecified: Secondary | ICD-10-CM | POA: Insufficient documentation

## 2019-12-25 DIAGNOSIS — I10 Essential (primary) hypertension: Secondary | ICD-10-CM | POA: Diagnosis not present

## 2019-12-25 DIAGNOSIS — H9202 Otalgia, left ear: Secondary | ICD-10-CM | POA: Insufficient documentation

## 2019-12-25 DIAGNOSIS — S2242XA Multiple fractures of ribs, left side, initial encounter for closed fracture: Secondary | ICD-10-CM | POA: Diagnosis not present

## 2019-12-25 DIAGNOSIS — Z5321 Procedure and treatment not carried out due to patient leaving prior to being seen by health care provider: Secondary | ICD-10-CM | POA: Insufficient documentation

## 2019-12-25 DIAGNOSIS — W19XXXA Unspecified fall, initial encounter: Secondary | ICD-10-CM | POA: Diagnosis not present

## 2019-12-25 DIAGNOSIS — R52 Pain, unspecified: Secondary | ICD-10-CM | POA: Diagnosis not present

## 2019-12-25 DIAGNOSIS — F0390 Unspecified dementia without behavioral disturbance: Secondary | ICD-10-CM | POA: Insufficient documentation

## 2019-12-25 DIAGNOSIS — J9811 Atelectasis: Secondary | ICD-10-CM | POA: Diagnosis not present

## 2019-12-25 NOTE — ED Triage Notes (Signed)
Pt presents via EMS from [place] at [facility], pt had an unwitnessed fall around midnight and daughter wanted patient to be evaluated. Hx dementia and pt unable to recall the fall but c/o L ear, L rib and R knee pain. Denies blood thinners

## 2019-12-25 NOTE — ED Notes (Signed)
Pts daughter sts she is taking him to his PCP. Pt seen getting in car and leaving.

## 2019-12-25 NOTE — Progress Notes (Signed)
Henry Adams is a 79 y.o. male with the following history as recorded in EpicCare:  Patient Active Problem List   Diagnosis Date Noted  . Traumatic coccydynia 10/05/2019  . Pressure injury of buttock, stage 1 10/05/2019  . Irritable bowel syndrome with both constipation and diarrhea 08/18/2019  . Dysuria 08/18/2019  . Epigastric abdominal tenderness without rebound tenderness 05/21/2019  . Psychophysiological insomnia 04/10/2019  . Benign prostatic hyperplasia with urinary hesitancy 06/25/2018  . Parkinsonism ([place])   . UTI (urinary tract infection) 06/16/2018  . CRI (chronic renal insufficiency), stage 3 (moderate) ([place]) 03/27/2018  . Restless leg syndrome 01/23/2018  . Calculus of gallbladder with chronic cholecystitis without obstruction 09/18/2017  . Chronic idiopathic constipation 08/19/2017  . BPH associated with nocturia 08/11/2015  . Vocal cord atrophy 09/01/2013  . Essential hypertension 04/26/2007  . Allergic rhinitis 04/26/2007  . DEGENERATIVE JOINT DISEASE 04/26/2007  . G E R D 08/05/2006  . Hyperlipidemia with target LDL less than 160 05/30/2006  . Polycystic kidney 05/30/2006    Current Outpatient Medications  Medication Sig Dispense Refill  . acetaminophen (TYLENOL) 500 MG tablet 500 mg prn for back, knee, shoulder or head pain. May be given up to every 6 hours. 90 tablet 3  . alfuzosin (UROXATRAL) 10 MG 24 hr tablet Take 1 tablet (10 mg total) by mouth 2 (two) times daily. 10 mg at 7 am and 7 pm daily.    . Carbidopa-Levodopa ER (SINEMET CR) 25-100 MG tablet controlled release TAKE ONE TABLET AT BEDTIME. 90 tablet 0  . Carbidopa-Levodopa ER (SINEMET CR) 25-100 MG tablet controlled release Take 1 tablet by mouth. Take 1 tab at 6am/10am/2pm/ 1.5 tab at 6pm    . clonazePAM (KLONOPIN) 0.5 MG tablet Take 1/2-1 tablet by mouth during the day as needed for anxiety/agitation and take 1.5 tablets at bedtime as needed for sleep/anxiety (Patient taking differently: Take 0.5 mg  by mouth at bedtime. ) 75 tablet 0  . donepezil (ARICEPT) 10 MG tablet Take 1 tablet (10 mg total) by mouth at bedtime. 90 tablet 1  . fexofenadine (ALLEGRA) 180 MG tablet 180mg  tablet at 11 am. (Patient taking differently: Take 180 mg by mouth daily. 11 am.) 90 tablet 3  . finasteride (PROSCAR) 5 MG tablet 5mg  at 7pm daily (Patient taking differently: Take 5 mg by mouth every evening. 7pm) 90 tablet 3  . FLUoxetine (PROZAC) 10 MG capsule Take 1 capsule (10 mg total) by mouth daily. 30 capsule 1  . memantine (NAMENDA) 10 MG tablet Take 10 mg by mouth 2 (two) times daily.    . Menthol-Zinc Oxide (CALMOSEPTINE) 0.44-20.6 % OINT Apply 1 Act topically daily. 113 g 3  . omeprazole (PRILOSEC) 40 MG capsule TAKE (1) CAPSULE DAILY. (Patient taking differently: Take 40 mg by mouth daily. ) 90 capsule 0  . Polyethylene Glycol 3350 (MIRALAX PO) Take by mouth.    . Probiotic Product ([place]) Take by mouth.    . vitamin B-12 (CYANOCOBALAMIN) 1000 MCG tablet 1000 mcg tablet at 11 am daily. (Patient taking differently: Take 1,000 mcg by mouth daily. ) 90 tablet 3   No current facility-administered medications for this visit.    Allergies: Iodine, Oxycodone, Pollen extract, Tamiflu [oseltamivir phosphate], Tamsulosin, Doxazosin, Etodolac, and Tape  Past Medical History:  Diagnosis Date  . Allergy    perennial  . Anxiety   . Arthritis   . Cholelithiasis   . Chronic idiopathic constipation   . CKD (chronic kidney disease)   .  Depression   . Diverticulosis   . Enlarged prostate   . Gallstones   . GERD (gastroesophageal reflux disease)   . HLD (hyperlipidemia)   . Hx of cardiovascular stress test    ETT-Myoview 6/14: probable low risk study, inf defect with normal inf wall motion-cannot r/o ischemia; prominent gut uptake adjacent to inf wall on rest images-poss defect is atten, EF 63%  . Hypertension   . Melanoma ([place])   . Parkinsonism ([place])   . Polycystic kidney disease     Past  Surgical History:  Procedure Laterality Date  . COLONOSCOPY  2011   diverticulosis  . HERNIA REPAIR     umbilical  . KNEE SURGERY Left   . medialization laryngoplasty  10/15/2013   Dr Joya Gaskins, [facility]  . TONSILLECTOMY AND ADENOIDECTOMY      Family History  Problem Relation Age of Onset  . Hypertension Father   . Cancer Brother        mouth  . Cancer Sister        breast  . Heart disease Brother        heart transplant  . Cancer Daughter        breast  . Stroke Sister        >46  . Skin cancer Brother        squamous cell  . Colon cancer Neg Hx     Social History   Tobacco Use  . Smoking status: Never Smoker  . Smokeless tobacco: Never Used  Substance Use Topics  . Alcohol use: Not Currently    Alcohol/week: 0.0 standard drinks    Subjective:  Accompanied by daughter today; fell at his home early this am and landed on his left side; was taken by ambulance to ER for evaluation; has already had X-rays done but notes the wait was so long at the ER that they opted to come here to discuss and treat;   Objective:  Vitals:   12/25/19 1123  BP: 140/88  Pulse: 68  Temp: 98.7 F (37.1 C)  TempSrc: Oral  SpO2: 98%  Weight: 167 lb (75.8 kg)  Height: 5\' 11"  (1.803 m)    General: Well developed, well nourished, in no acute distress  Skin : Warm and dry. Bruising noted over left ear;  Head: Normocephalic and atraumatic  Eyes: Sclera and conjunctiva clear; pupils round and reactive to light; extraocular movements intact  Lungs: Respirations unlabored; Neurologic: Alert and oriented; speech intact; face symmetrical; uses walker;    Assessment:  1. Closed fracture of multiple ribs of left side, initial encounter   2. Essential hypertension     Plan:  1. Reviewed X-ray done at ER earlier today; discussed need to apply ice and continue Tylenol as needed; orders written for assisted living; encouraged to use pillow for support and try to take as deep a breat as possible; family  in agreement that risks of narcotics and muscle relaxers outweigh benefits; 2. ? Control; order for daily checks of blood pressure to be sent to PCP after next week; follow to be determined;  Time spent 30 minutes coordinating care, reviewing notes from ER;   This visit occurred during the SARS-CoV-2 public health emergency.  Safety protocols were in place, including screening questions prior to the visit, additional usage of staff PPE, and extensive cleaning of exam room while observing appropriate contact time as indicated for disinfecting solutions.      Return in about 6 weeks (around 02/05/2020) for Dr. Ronnald Ramp.  No orders of the defined types were placed in this encounter.   Requested Prescriptions    No prescriptions requested or ordered in this encounter

## 2019-12-25 NOTE — Patient Instructions (Signed)
Rib Fracture  A rib fracture is a break or crack in one of the bones of the ribs. The ribs are like a cage that goes around your upper chest. A broken or cracked rib is often painful, but most do not cause other problems. Most rib fractures usually heal on their own in 1-3 months. Follow these instructions at home: Managing pain, stiffness, and swelling  If directed, apply ice to the injured area. ? Put ice in a plastic bag. ? Place a towel between your skin and the bag. ? Leave the ice on for 20 minutes, 2-3 times a day.  Take over-the-counter and prescription medicines only as told by your doctor. Activity  Avoid activities that cause pain to the injured area. Protect your injured area.  Slowly increase activity as told by your doctor. General instructions  Do deep breathing as told by your doctor. You may be told to: ? Take deep breaths many times a day. ? Cough many times a day while hugging a pillow. ? Use a device (incentive spirometer) to do deep breathing many times a day.  Drink enough fluid to keep your pee (urine) clear or pale yellow.  Do not wear a rib belt or binder. These do not allow you to breathe deeply.  Keep all follow-up visits as told by your doctor. This is important. Contact a doctor if:  You have a fever. Get help right away if:  You have trouble breathing.  You are short of breath.  You cannot stop coughing.  You cough up thick or bloody spit (sputum).  You feel sick to your stomach (nauseous), throw up (vomit), or have belly (abdominal) pain.  Your pain gets worse and medicine does not help. Summary  A rib fracture is a break or crack in one of the bones of the ribs.  Apply ice to the injured area and take medicines for pain as told by your doctor.  Take deep breaths and cough many times a day. Hug a pillow every time you cough. This information is not intended to replace advice given to you by your health care provider. Make sure you  discuss any questions you have with your health care provider. Document Revised: 12/21/2016 Document Reviewed: 04/10/2016 Elsevier Patient Education  2020 Elsevier Inc.  

## 2020-01-05 DIAGNOSIS — R262 Difficulty in walking, not elsewhere classified: Secondary | ICD-10-CM | POA: Diagnosis not present

## 2020-01-05 DIAGNOSIS — R2681 Unsteadiness on feet: Secondary | ICD-10-CM | POA: Diagnosis not present

## 2020-01-05 DIAGNOSIS — G20 Parkinson's disease: Secondary | ICD-10-CM | POA: Diagnosis not present

## 2020-01-07 ENCOUNTER — Other Ambulatory Visit: Payer: Self-pay | Admitting: Internal Medicine

## 2020-01-07 ENCOUNTER — Telehealth: Payer: Self-pay | Admitting: Internal Medicine

## 2020-01-07 DIAGNOSIS — N401 Enlarged prostate with lower urinary tract symptoms: Secondary | ICD-10-CM

## 2020-01-07 DIAGNOSIS — R2681 Unsteadiness on feet: Secondary | ICD-10-CM | POA: Diagnosis not present

## 2020-01-07 DIAGNOSIS — G20 Parkinson's disease: Secondary | ICD-10-CM | POA: Diagnosis not present

## 2020-01-07 DIAGNOSIS — R262 Difficulty in walking, not elsewhere classified: Secondary | ICD-10-CM | POA: Diagnosis not present

## 2020-01-07 DIAGNOSIS — E538 Deficiency of other specified B group vitamins: Secondary | ICD-10-CM

## 2020-01-07 DIAGNOSIS — K5900 Constipation, unspecified: Secondary | ICD-10-CM

## 2020-01-07 DIAGNOSIS — J301 Allergic rhinitis due to pollen: Secondary | ICD-10-CM

## 2020-01-07 MED ORDER — PHILLIPS COLON HEALTH PO CAPS: 1.0000 | ORAL_CAPSULE | Freq: Every day | ORAL | 1 refills | Status: AC

## 2020-01-07 MED ORDER — VITAMIN B-12 1000 MCG PO TABS: 1000.0000 ug | ORAL_TABLET | Freq: Every day | ORAL | 3 refills | Status: AC

## 2020-01-07 MED ORDER — FEXOFENADINE HCL 180 MG PO TABS: 180.0000 mg | ORAL_TABLET | Freq: Every day | ORAL | 3 refills | Status: AC

## 2020-01-07 MED ORDER — FINASTERIDE 5 MG PO TABS: 5.0000 mg | ORAL_TABLET | Freq: Every day | ORAL | 3 refills | Status: DC

## 2020-01-07 NOTE — Telephone Encounter (Signed)
fexofenadine (ALLEGRA) 180 MG tablet   finasteride (PROSCAR) 5 MG tablet  Probiotic Product ([facility] PO)  vitamin B-12 (CYANOCOBALAMIN) 1000 MCG tablet  [facility] needs to have the dosing changed from a timed dosage to a daily dosage for the medications above. Please fax to : [phone]  Also, [name] would like a message sent in my chart to let her know it has been done (she has proxy access to his My chart)

## 2020-01-08 DIAGNOSIS — R350 Frequency of micturition: Secondary | ICD-10-CM | POA: Diagnosis not present

## 2020-01-08 NOTE — Telephone Encounter (Signed)
done

## 2020-01-11 DIAGNOSIS — G20 Parkinson's disease: Secondary | ICD-10-CM | POA: Diagnosis not present

## 2020-01-11 DIAGNOSIS — R262 Difficulty in walking, not elsewhere classified: Secondary | ICD-10-CM | POA: Diagnosis not present

## 2020-01-11 DIAGNOSIS — R2681 Unsteadiness on feet: Secondary | ICD-10-CM | POA: Diagnosis not present

## 2020-01-13 DIAGNOSIS — R262 Difficulty in walking, not elsewhere classified: Secondary | ICD-10-CM | POA: Diagnosis not present

## 2020-01-13 DIAGNOSIS — R2681 Unsteadiness on feet: Secondary | ICD-10-CM | POA: Diagnosis not present

## 2020-01-13 DIAGNOSIS — G20 Parkinson's disease: Secondary | ICD-10-CM | POA: Diagnosis not present

## 2020-01-26 DIAGNOSIS — R262 Difficulty in walking, not elsewhere classified: Secondary | ICD-10-CM | POA: Diagnosis not present

## 2020-01-26 DIAGNOSIS — G20 Parkinson's disease: Secondary | ICD-10-CM | POA: Diagnosis not present

## 2020-01-26 DIAGNOSIS — R2681 Unsteadiness on feet: Secondary | ICD-10-CM | POA: Diagnosis not present

## 2020-01-28 DIAGNOSIS — E785 Hyperlipidemia, unspecified: Secondary | ICD-10-CM | POA: Diagnosis not present

## 2020-01-28 DIAGNOSIS — G20 Parkinson's disease: Secondary | ICD-10-CM | POA: Diagnosis not present

## 2020-01-28 DIAGNOSIS — N183 Chronic kidney disease, stage 3 unspecified: Secondary | ICD-10-CM | POA: Diagnosis not present

## 2020-01-28 DIAGNOSIS — R339 Retention of urine, unspecified: Secondary | ICD-10-CM | POA: Diagnosis not present

## 2020-01-28 DIAGNOSIS — I129 Hypertensive chronic kidney disease with stage 1 through stage 4 chronic kidney disease, or unspecified chronic kidney disease: Secondary | ICD-10-CM | POA: Diagnosis not present

## 2020-01-29 DIAGNOSIS — R498 Other voice and resonance disorders: Secondary | ICD-10-CM | POA: Diagnosis not present

## 2020-02-01 DIAGNOSIS — M6281 Muscle weakness (generalized): Secondary | ICD-10-CM | POA: Diagnosis not present

## 2020-02-01 DIAGNOSIS — R278 Other lack of coordination: Secondary | ICD-10-CM | POA: Diagnosis not present

## 2020-02-01 DIAGNOSIS — G20 Parkinson's disease: Secondary | ICD-10-CM | POA: Diagnosis not present

## 2020-02-02 DIAGNOSIS — M6281 Muscle weakness (generalized): Secondary | ICD-10-CM | POA: Diagnosis not present

## 2020-02-02 DIAGNOSIS — G20 Parkinson's disease: Secondary | ICD-10-CM | POA: Diagnosis not present

## 2020-02-02 DIAGNOSIS — R278 Other lack of coordination: Secondary | ICD-10-CM | POA: Diagnosis not present

## 2020-02-02 DIAGNOSIS — R498 Other voice and resonance disorders: Secondary | ICD-10-CM | POA: Diagnosis not present

## 2020-02-03 ENCOUNTER — Telehealth: Payer: Self-pay | Admitting: Internal Medicine

## 2020-02-03 DIAGNOSIS — R498 Other voice and resonance disorders: Secondary | ICD-10-CM | POA: Diagnosis not present

## 2020-02-03 NOTE — Telephone Encounter (Signed)
Patients daughter called and had some questions about the patients medication. Please call [phone].

## 2020-02-04 ENCOUNTER — Encounter: Payer: Self-pay | Admitting: Internal Medicine

## 2020-02-04 NOTE — Telephone Encounter (Signed)
Patients daughter is calling back and is requesting a call in regards to the patients medication. Please call [phone].

## 2020-02-04 NOTE — Telephone Encounter (Signed)
MyChart message has been forwarded to PCP in regard.

## 2020-02-05 ENCOUNTER — Other Ambulatory Visit: Payer: Self-pay | Admitting: Internal Medicine

## 2020-02-05 DIAGNOSIS — F5104 Psychophysiologic insomnia: Secondary | ICD-10-CM

## 2020-02-05 DIAGNOSIS — N401 Enlarged prostate with lower urinary tract symptoms: Secondary | ICD-10-CM

## 2020-02-05 DIAGNOSIS — U071 COVID-19: Secondary | ICD-10-CM

## 2020-02-05 DIAGNOSIS — J208 Acute bronchitis due to other specified organisms: Secondary | ICD-10-CM | POA: Insufficient documentation

## 2020-02-05 MED ORDER — FINASTERIDE 5 MG PO TABS: 5.0000 mg | ORAL_TABLET | Freq: Every day | ORAL | 1 refills | Status: AC

## 2020-02-05 MED ORDER — CLONAZEPAM 0.5 MG PO TABS: 0.5000 mg | ORAL_TABLET | Freq: Every day | ORAL | 1 refills | Status: DC

## 2020-02-05 MED ORDER — DEXTROMETHORPHAN POLISTIREX ER 30 MG/5ML PO SUER: 30.0000 mg | Freq: Two times a day (BID) | ORAL | 0 refills | Status: DC

## 2020-02-05 NOTE — Telephone Encounter (Signed)
Patients daughter is calling again and requesting a call back in regards to the MyChart message. [phone].

## 2020-02-11 ENCOUNTER — Other Ambulatory Visit: Payer: Self-pay

## 2020-02-11 ENCOUNTER — Ambulatory Visit (INDEPENDENT_AMBULATORY_CARE_PROVIDER_SITE_OTHER): Payer: Medicare Other | Admitting: Internal Medicine

## 2020-02-11 ENCOUNTER — Encounter: Payer: Self-pay | Admitting: Internal Medicine

## 2020-02-11 VITALS — BP 132/84 | HR 68 | Temp 98.0°F | Resp 16 | Ht 71.0 in | Wt 166.1 lb

## 2020-02-11 DIAGNOSIS — L233 Allergic contact dermatitis due to drugs in contact with skin: Secondary | ICD-10-CM | POA: Insufficient documentation

## 2020-02-11 DIAGNOSIS — B354 Tinea corporis: Secondary | ICD-10-CM

## 2020-02-11 MED ORDER — TERBINAFINE HCL 250 MG PO TABS: 250.0000 mg | ORAL_TABLET | Freq: Every day | ORAL | 0 refills | Status: DC

## 2020-02-11 MED ORDER — FLUOCINONIDE 0.05 % EX CREA: 1.0000 "application " | TOPICAL_CREAM | Freq: Two times a day (BID) | CUTANEOUS | 0 refills | Status: AC

## 2020-02-11 MED ORDER — FLUOCINONIDE 0.05 % EX CREA: 1.0000 "application " | TOPICAL_CREAM | Freq: Two times a day (BID) | CUTANEOUS | 0 refills | Status: DC

## 2020-02-11 MED ORDER — TERBINAFINE HCL 250 MG PO TABS
250.0000 mg | ORAL_TABLET | Freq: Every day | ORAL | 0 refills | Status: AC
Start: 2020-02-11 — End: 2020-02-25

## 2020-02-11 NOTE — Progress Notes (Signed)
Subjective:  Patient ID: Henry Adams, male    DOB: 03-Jun-1940  Age: 80 y.o. MRN: [id number]  CC: Rash  This visit occurred during the SARS-CoV-2 public health emergency.  Safety protocols were in place, including screening questions prior to the visit, additional usage of staff PPE, and extensive cleaning of exam room while observing appropriate contact time as indicated for disinfecting solutions.    HPI Henry Adams presents for f/up - He returns with his daughter today.  Over the last week he has developed a red rash on his lower back that is mildly pruritic.  She does not think any topical agents are being applied to the area.  Outpatient Medications Prior to Visit  Medication Sig Dispense Refill  . acetaminophen (TYLENOL) 500 MG tablet 500 mg prn for back, knee, shoulder or head pain. May be given up to every 6 hours. 90 tablet 3  . alfuzosin (UROXATRAL) 10 MG 24 hr tablet Take 1 tablet (10 mg total) by mouth 2 (two) times daily. 10 mg at 7 am and 7 pm daily.    . Carbidopa-Levodopa ER (SINEMET CR) 25-100 MG tablet controlled release TAKE ONE TABLET AT BEDTIME. 90 tablet 0  . Carbidopa-Levodopa ER (SINEMET CR) 25-100 MG tablet controlled release Take 1 tablet by mouth. Take 1 tab at 6am/10am/2pm/ 1.5 tab at 6pm    . clonazePAM (KLONOPIN) 0.5 MG tablet Take 1 tablet (0.5 mg total) by mouth at bedtime. 90 tablet 1  . donepezil (ARICEPT) 10 MG tablet Take 1 tablet (10 mg total) by mouth at bedtime. 90 tablet 1  . fexofenadine (ALLEGRA) 180 MG tablet Take 1 tablet (180 mg total) by mouth daily. 180mg  QD 90 tablet 3  . finasteride (PROSCAR) 5 MG tablet Take 1 tablet (5 mg total) by mouth daily. 5mg  QD 90 tablet 1  . FLUoxetine (PROZAC) 10 MG capsule Take 1 capsule (10 mg total) by mouth daily. 30 capsule 1  . memantine (NAMENDA) 10 MG tablet Take 10 mg by mouth 2 (two) times daily.    [name] omeprazole (PRILOSEC) 40 MG capsule TAKE (1) CAPSULE DAILY. (Patient taking differently: Take 40  mg by mouth daily.) 90 capsule 0  . Polyethylene Glycol 3350 (MIRALAX PO) Take by mouth.    . Probiotic Product ([place]) CAPS Take 1 capsule by mouth daily. 90 capsule 1  . vitamin B-12 (CYANOCOBALAMIN) 1000 MCG tablet Take 1 tablet (1,000 mcg total) by mouth daily. 90 tablet 3  . Menthol-Zinc Oxide (CALMOSEPTINE) 0.44-20.6 % OINT Apply 1 Act topically daily. (Patient not taking: Reported on 02/11/2020) 113 g 3  . dextromethorphan (DELSYM) 30 MG/5ML liquid Take 5 mLs (30 mg total) by mouth 2 (two) times daily. 148 mL 0   No facility-administered medications prior to visit.    ROS Review of Systems  Constitutional: Positive for fatigue. Negative for chills and fever.  HENT: Negative.   Eyes: Negative.   Respiratory: Negative for cough, chest tightness, shortness of breath and wheezing.   Cardiovascular: Negative for chest pain, palpitations and leg swelling.  Gastrointestinal: Negative for abdominal pain, constipation, diarrhea, nausea and vomiting.  Genitourinary: Negative.   Musculoskeletal: Negative for arthralgias and myalgias.  Skin: Positive for rash. Negative for color change.  Neurological: Negative.  Negative for dizziness, weakness, light-headedness and numbness.  Hematological: Negative for adenopathy. Does not bruise/bleed easily.  Psychiatric/Behavioral: Negative.     Objective:  BP 132/84 (BP Location: Left Arm, Patient Position: Sitting, Cuff  Size: Normal)   Pulse 68   Temp 98 F (36.7 C) (Oral)   Resp 16   Ht 5\' 11"  (1.803 m)   Wt 166 lb 2 oz (75.4 kg)   SpO2 98%   BMI 23.17 kg/m   BP Readings from Last 3 Encounters:  02/11/20 132/84  12/25/19 140/88  12/25/19 (!) 191/106    Wt Readings from Last 3 Encounters:  02/11/20 166 lb 2 oz (75.4 kg)  12/25/19 167 lb (75.8 kg)  12/25/19 167 lb 1.7 oz (75.8 kg)    Physical Exam Vitals reviewed.  HENT:     Nose: Nose normal.     Mouth/Throat:     Mouth: Mucous membranes are moist.  Eyes:      General: No scleral icterus.    Conjunctiva/sclera: Conjunctivae normal.  Cardiovascular:     Rate and Rhythm: Normal rate and regular rhythm.     Heart sounds: No murmur heard.   Pulmonary:     Effort: Pulmonary effort is normal.     Breath sounds: No stridor. No wheezing, rhonchi or rales.  Abdominal:     General: Abdomen is flat. Bowel sounds are normal. There is no distension.     Palpations: Abdomen is soft. There is no hepatomegaly, splenomegaly or mass.     Tenderness: There is no abdominal tenderness.  Musculoskeletal:        General: Normal range of motion.     Cervical back: Neck supple.     Right lower leg: No edema.     Left lower leg: No edema.  Skin:    Findings: Rash present.     Comments: Over both sides of the lower back there are areas of serpiginous erythema with peeling, scaling on the edges.  There is faint blanching.  See photos.  Neurological:     General: No focal deficit present.     Lab Results  Component Value Date   WBC 5.5 08/03/2019   HGB 12.3 (L) 08/03/2019   HCT 36.6 (L) 08/03/2019   PLT 140 08/03/2019   GLUCOSE 104 (H) 08/03/2019   CHOL 179 11/14/2016   TRIG 73.0 11/14/2016   HDL 68.50 11/14/2016   LDLDIRECT 131.7 07/02/2011   LDLCALC 96 11/14/2016   ALT 8 05/21/2019   AST 16 05/21/2019   NA 138 08/03/2019   K 3.8 08/03/2019   CL 104 08/03/2019   CREATININE 1.39 (H) 08/03/2019   BUN 22 08/03/2019   CO2 27 08/03/2019   TSH 1.36 06/25/2018   PSA 2.28 08/11/2015   INR 1.1 (H) 08/01/2012   HGBA1C 5.8 12/29/2018    DG Ribs Unilateral W/Chest Left  Result Date: 12/25/2019 CLINICAL DATA:  Fall.  Rib pain. EXAM: LEFT RIBS AND CHEST - 3+ VIEW COMPARISON:  CT chest report 05/22/2019.  Chest x-ray 06/16/2018. FINDINGS: Mediastinum and hilar structures normal. Low lung volumes with bibasilar atelectasis. Bibasilar mild infiltrates cannot be excluded. No pleural effusion or pneumothorax. Ribs are difficult to number on these images. Subtle  nondisplaced left lower rib fractures cannot be excluded. IMPRESSION: 1. Low lung volumes with bibasilar atelectasis. Bibasilar mild infiltrates cannot be excluded. 2. Ribs are difficult to number on these images. Subtle nondisplaced left lower rib fractures cannot be excluded. No pneumothorax. Electronically Signed   By: Marcello Moores  Register   On: 12/25/2019 06:25    Assessment & Plan:   Kelyn was seen today for rash.  Diagnoses and all orders for this visit:  Tinea corporis- I will empirically treat  for tinea with a 14-day course of systemic terbinafine. -     Discontinue: terbinafine (LAMISIL) 250 MG tablet; Take 1 tablet (250 mg total) by mouth daily for 14 days. -     terbinafine (LAMISIL) 250 MG tablet; Take 1 tablet (250 mg total) by mouth daily for 14 days.  Allergic contact dermatitis due to drugs in contact with skin- There could also be an element of allergy. Will treat with a potent topical steroid. -     Discontinue: fluocinonide cream (LIDEX) 0.05 %; Apply 1 application topically 2 (two) times daily for 10 days. -     fluocinonide cream (LIDEX) 0.05 %; Apply 1 application topically 2 (two) times daily for 10 days.   I have discontinued Josie Saunders. Radziewicz's dextromethorphan. I am also having him maintain his acetaminophen, omeprazole, alfuzosin, donepezil, FLUoxetine, Polyethylene Glycol 3350 (MIRALAX PO), Calmoseptine, Carbidopa-Levodopa ER, Carbidopa-Levodopa ER, memantine, fexofenadine, vitamin B-12, [facility], clonazePAM, finasteride, fluocinonide cream, and terbinafine.  Meds ordered this encounter  Medications  . DISCONTD: terbinafine (LAMISIL) 250 MG tablet    Sig: Take 1 tablet (250 mg total) by mouth daily for 14 days.    Dispense:  14 tablet    Refill:  0  . DISCONTD: fluocinonide cream (LIDEX) 0.05 %    Sig: Apply 1 application topically 2 (two) times daily for 10 days.    Dispense:  60 g    Refill:  0  . fluocinonide cream (LIDEX) 0.05 %    Sig: Apply 1  application topically 2 (two) times daily for 10 days.    Dispense:  60 g    Refill:  0  . terbinafine (LAMISIL) 250 MG tablet    Sig: Take 1 tablet (250 mg total) by mouth daily for 14 days.    Dispense:  14 tablet    Refill:  0     Follow-up: Return in about 3 weeks (around 03/03/2020).  Scarlette Calico, MD

## 2020-02-11 NOTE — Patient Instructions (Signed)

## 2020-02-12 DIAGNOSIS — R498 Other voice and resonance disorders: Secondary | ICD-10-CM | POA: Diagnosis not present

## 2020-02-15 DIAGNOSIS — R498 Other voice and resonance disorders: Secondary | ICD-10-CM | POA: Diagnosis not present

## 2020-02-16 ENCOUNTER — Telehealth: Payer: Self-pay | Admitting: Internal Medicine

## 2020-02-16 ENCOUNTER — Emergency Department (HOSPITAL_BASED_OUTPATIENT_CLINIC_OR_DEPARTMENT_OTHER): Payer: Medicare Other

## 2020-02-16 ENCOUNTER — Other Ambulatory Visit: Payer: Self-pay

## 2020-02-16 ENCOUNTER — Encounter (HOSPITAL_BASED_OUTPATIENT_CLINIC_OR_DEPARTMENT_OTHER): Payer: Self-pay | Admitting: *Deleted

## 2020-02-16 ENCOUNTER — Emergency Department (HOSPITAL_BASED_OUTPATIENT_CLINIC_OR_DEPARTMENT_OTHER)
Admission: EM | Admit: 2020-02-16 | Discharge: 2020-02-17 | Disposition: A | Discharge status: Home / Self Care | Payer: Medicare Other | Attending: Emergency Medicine | Admitting: Emergency Medicine

## 2020-02-16 DIAGNOSIS — R21 Rash and other nonspecific skin eruption: Secondary | ICD-10-CM | POA: Insufficient documentation

## 2020-02-16 DIAGNOSIS — N2881 Hypertrophy of kidney: Secondary | ICD-10-CM | POA: Diagnosis not present

## 2020-02-16 DIAGNOSIS — N390 Urinary tract infection, site not specified: Secondary | ICD-10-CM | POA: Diagnosis present

## 2020-02-16 DIAGNOSIS — R059 Cough, unspecified: Secondary | ICD-10-CM | POA: Diagnosis not present

## 2020-02-16 DIAGNOSIS — K5900 Constipation, unspecified: Secondary | ICD-10-CM | POA: Insufficient documentation

## 2020-02-16 DIAGNOSIS — R8281 Pyuria: Secondary | ICD-10-CM

## 2020-02-16 DIAGNOSIS — Z20822 Contact with and (suspected) exposure to covid-19: Secondary | ICD-10-CM | POA: Diagnosis not present

## 2020-02-16 DIAGNOSIS — I129 Hypertensive chronic kidney disease with stage 1 through stage 4 chronic kidney disease, or unspecified chronic kidney disease: Secondary | ICD-10-CM | POA: Diagnosis not present

## 2020-02-16 DIAGNOSIS — R519 Headache, unspecified: Secondary | ICD-10-CM | POA: Diagnosis not present

## 2020-02-16 DIAGNOSIS — Z79899 Other long term (current) drug therapy: Secondary | ICD-10-CM | POA: Insufficient documentation

## 2020-02-16 DIAGNOSIS — U071 COVID-19: Secondary | ICD-10-CM | POA: Diagnosis not present

## 2020-02-16 DIAGNOSIS — K7689 Other specified diseases of liver: Secondary | ICD-10-CM | POA: Diagnosis not present

## 2020-02-16 DIAGNOSIS — R140 Abdominal distension (gaseous): Secondary | ICD-10-CM | POA: Diagnosis not present

## 2020-02-16 DIAGNOSIS — R4182 Altered mental status, unspecified: Secondary | ICD-10-CM | POA: Insufficient documentation

## 2020-02-16 DIAGNOSIS — J189 Pneumonia, unspecified organism: Secondary | ICD-10-CM | POA: Diagnosis not present

## 2020-02-16 DIAGNOSIS — N183 Chronic kidney disease, stage 3 unspecified: Secondary | ICD-10-CM | POA: Diagnosis not present

## 2020-02-16 DIAGNOSIS — I10 Essential (primary) hypertension: Secondary | ICD-10-CM | POA: Diagnosis not present

## 2020-02-16 DIAGNOSIS — R0981 Nasal congestion: Secondary | ICD-10-CM | POA: Diagnosis not present

## 2020-02-16 DIAGNOSIS — R531 Weakness: Secondary | ICD-10-CM

## 2020-02-16 DIAGNOSIS — R410 Disorientation, unspecified: Secondary | ICD-10-CM | POA: Diagnosis present

## 2020-02-16 LAB — COMPREHENSIVE METABOLIC PANEL
ALT: 10 U/L (ref 0–44)
AST: 21 U/L (ref 15–41)
Albumin: 3.5 g/dL (ref 3.5–5.0)
Alkaline Phosphatase: 68 U/L (ref 38–126)
Anion gap: 8 (ref 5–15)
BUN: 24 mg/dL — ABNORMAL HIGH (ref 8–23)
CO2: 24 mmol/L (ref 22–32)
Calcium: 8.4 mg/dL — ABNORMAL LOW (ref 8.9–10.3)
Chloride: 103 mmol/L (ref 98–111)
Creatinine, Ser: 1.41 mg/dL — ABNORMAL HIGH (ref 0.61–1.24)
GFR, Estimated: 50 mL/min — ABNORMAL LOW (ref >60)
Glucose, Bld: 113 mg/dL — ABNORMAL HIGH (ref 70–99)
Potassium: 3.7 mmol/L (ref 3.5–5.1)
Sodium: 135 mmol/L (ref 135–145)
Total Bilirubin: 0.9 mg/dL (ref 0.3–1.2)
Total Protein: 6.8 g/dL (ref 6.5–8.1)

## 2020-02-16 LAB — CBC WITH DIFFERENTIAL/PLATELET
Abs Immature Granulocytes: 0.01 10*3/uL (ref 0.00–0.07)
Basophils Absolute: 0 10*3/uL (ref 0.0–0.1)
Basophils Relative: 1 %
Eosinophils Absolute: 0.2 10*3/uL (ref 0.0–0.5)
Eosinophils Relative: 3 %
HCT: 37 % — ABNORMAL LOW (ref 39.0–52.0)
Hemoglobin: 12 g/dL — ABNORMAL LOW (ref 13.0–17.0)
Immature Granulocytes: 0 %
Lymphocytes Relative: 35 %
Lymphs Abs: 1.7 10*3/uL (ref 0.7–4.0)
MCH: 31.2 pg (ref 26.0–34.0)
MCHC: 32.4 g/dL (ref 30.0–36.0)
MCV: 96.1 fL (ref 80.0–100.0)
Monocytes Absolute: 0.5 10*3/uL (ref 0.1–1.0)
Monocytes Relative: 11 %
Neutro Abs: 2.4 10*3/uL (ref 1.7–7.7)
Neutrophils Relative %: 50 %
Platelets: 154 10*3/uL (ref 150–400)
RBC: 3.85 MIL/uL — ABNORMAL LOW (ref 4.22–5.81)
RDW: 13.4 % (ref 11.5–15.5)
WBC: 4.8 10*3/uL (ref 4.0–10.5)
nRBC: 0 % (ref 0.0–0.2)

## 2020-02-16 LAB — URINALYSIS, MICROSCOPIC (REFLEX)

## 2020-02-16 LAB — URINALYSIS, ROUTINE W REFLEX MICROSCOPIC
Bilirubin Urine: NEGATIVE
Glucose, UA: NEGATIVE mg/dL
Ketones, ur: NEGATIVE mg/dL
Nitrite: NEGATIVE
Protein, ur: 100 mg/dL — AB
Specific Gravity, Urine: 1.015 (ref 1.005–1.030)
pH: 7.5 (ref 5.0–8.0)

## 2020-02-16 LAB — SARS CORONAVIRUS 2 BY RT PCR (HOSPITAL ORDER, PERFORMED IN ~~LOC~~ HOSPITAL LAB): SARS Coronavirus 2: NEGATIVE

## 2020-02-16 LAB — TROPONIN I (HIGH SENSITIVITY): Troponin I (High Sensitivity): 8 ng/L (ref <18)

## 2020-02-16 MED ORDER — SODIUM CHLORIDE 0.9 % IV BOLUS
1000.0000 mL | Freq: Once | INTRAVENOUS | Status: AC
  Administered 2020-02-16: 1000 mL via INTRAVENOUS

## 2020-02-16 MED ORDER — SODIUM CHLORIDE 0.9 % IV SOLN
500.0000 mg | Freq: Once | INTRAVENOUS | Status: AC
  Administered 2020-02-17: 500 mg via INTRAVENOUS
  Filled 2020-02-16: qty 500

## 2020-02-16 MED ORDER — SODIUM CHLORIDE 0.9 % IV SOLN
1.0000 g | Freq: Once | INTRAVENOUS | Status: AC
  Administered 2020-02-16: 1 g via INTRAVENOUS
  Filled 2020-02-16: qty 10

## 2020-02-16 NOTE — Telephone Encounter (Signed)
Patient called and said that the patient is experiencing congestion, runny nose, headache, dizziness and body aches. She said that this started 1.24.22. Transferred to [facility].

## 2020-02-16 NOTE — ED Triage Notes (Signed)
He was seen at [facility] today with headache, body aches, congestion. He had an xray that showed possible pneumonia. Daughter was encouraged her to come to the ER. He was positive for [facility] January 10th. Oxygen sats are 98%. Weakness.

## 2020-02-16 NOTE — Telephone Encounter (Signed)
Patient is going to urgent care 

## 2020-02-16 NOTE — ED Notes (Signed)
Pt transported to Radiology at this time. 

## 2020-02-16 NOTE — ED Provider Notes (Signed)
[facility] EMERGENCY DEPARTMENT Provider Note   CSN: [id number] Arrival date & time: 02/16/20  1941     History Chief Complaint  Patient presents with  . Weakness  . Pneumonia    Henry Adams is a 80 y.o. male.  Patient with history of chronic kidney disease, polycystic kidney disease, parkinsonism, gallstone, Covid + January 10 but negative on January 17 here from urgent care with concern for pneumonia.  Daughter at bedside reports 2-day history of fatigue, head congestion, body aches, chills, headache, poor appetite and constipation.  He was seen in urgent care today who reported "glob on his lung" and told him to come to the hospital for pneumonia.  Daughter reports increased confusion today was somewhat slow to respond.  Generalized weakness and chills with poor appetite.  Negative Covid test at his facility on the 17th.  No pain with urination or blood in the urine.  He denies chest pain or difficulty breathing.  Complains of aches all over and fatigue and generalized weakness.  Denies cough or denies feeling short of breath. Also has noticed blood pressure elevated today.  Does not normally take blood pressure medication  The history is provided by the patient.  Weakness Associated symptoms: abdominal pain, arthralgias, cough and myalgias   Associated symptoms: no chest pain, no dysuria, no fever, no nausea, no shortness of breath and no vomiting   Pneumonia Associated symptoms include abdominal pain. Pertinent negatives include no chest pain and no shortness of breath.       Past Medical History:  Diagnosis Date  . Allergy    perennial  . Anxiety   . Arthritis   . Cholelithiasis   . Chronic idiopathic constipation   . CKD (chronic kidney disease)   . Depression   . Diverticulosis   . Enlarged prostate   . Gallstones   . GERD (gastroesophageal reflux disease)   . HLD (hyperlipidemia)   . Hx of cardiovascular stress test    ETT-Myoview 6/14: probable low  risk study, inf defect with normal inf wall motion-cannot r/o ischemia; prominent gut uptake adjacent to inf wall on rest images-poss defect is atten, EF 63%  . Hypertension   . Melanoma ([place])   . Parkinsonism ([place])   . Polycystic kidney disease     Patient Active Problem List   Diagnosis Date Noted  . Tinea corporis 02/11/2020  . Allergic contact dermatitis due to drugs in contact with skin 02/11/2020  . Acute bronchitis due to COVID-19 virus 02/05/2020  . Traumatic coccydynia 10/05/2019  . Pressure injury of buttock, stage 1 10/05/2019  . Irritable bowel syndrome with both constipation and diarrhea 08/18/2019  . Dysuria 08/18/2019  . Epigastric abdominal tenderness without rebound tenderness 05/21/2019  . Psychophysiological insomnia 04/10/2019  . Benign prostatic hyperplasia with urinary hesitancy 06/25/2018  . Parkinsonism ([place])   . UTI (urinary tract infection) 06/16/2018  . CRI (chronic renal insufficiency), stage 3 (moderate) ([place]) 03/27/2018  . Restless leg syndrome 01/23/2018  . Calculus of gallbladder with chronic cholecystitis without obstruction 09/18/2017  . Chronic idiopathic constipation 08/19/2017  . BPH associated with nocturia 08/11/2015  . Vocal cord atrophy 09/01/2013  . Essential hypertension 04/26/2007  . Allergic rhinitis 04/26/2007  . DEGENERATIVE JOINT DISEASE 04/26/2007  . G E R D 08/05/2006  . Hyperlipidemia with target LDL less than 160 05/30/2006  . Polycystic kidney 05/30/2006    Past Surgical History:  Procedure Laterality Date  . COLONOSCOPY  2011   diverticulosis  .  HERNIA REPAIR     umbilical  . KNEE SURGERY Left   . medialization laryngoplasty  10/15/2013   Dr Joya Gaskins, [facility]  . TONSILLECTOMY AND ADENOIDECTOMY         Family History  Problem Relation Age of Onset  . Hypertension Father   . Cancer Brother        mouth  . Cancer Sister        breast  . Heart disease Brother        heart transplant  . Cancer Daughter         breast  . Stroke Sister        >22  . Skin cancer Brother        squamous cell  . Colon cancer Neg Hx     Social History   Tobacco Use  . Smoking status: Never Smoker  . Smokeless tobacco: Never Used  Vaping Use  . Vaping Use: Never used  Substance Use Topics  . Alcohol use: Not Currently    Alcohol/week: 0.0 standard drinks  . Drug use: No    Home Medications Prior to Admission medications   Medication Sig Start Date End Date Taking? Authorizing Provider  acetaminophen (TYLENOL) 500 MG tablet 500 mg prn for back, knee, shoulder or head pain. May be given up to every 6 hours. 02/05/19   [name], MD  alfuzosin (UROXATRAL) 10 MG 24 hr tablet Take 1 tablet (10 mg total) by mouth 2 (two) times daily. 10 mg at 7 am and 7 pm daily. 04/08/19   [name], MD  Carbidopa-Levodopa ER (SINEMET CR) 25-100 MG tablet controlled release TAKE ONE TABLET AT BEDTIME. 11/09/19   Tat, Eustace Quail, DO  Carbidopa-Levodopa ER (SINEMET CR) 25-100 MG tablet controlled release Take 1 tablet by mouth. Take 1 tab at 6am/10am/2pm/ 1.5 tab at 6pm    [provider]  clonazePAM (KLONOPIN) 0.5 MG tablet Take 1 tablet (0.5 mg total) by mouth at bedtime. 02/05/20   [name], MD  donepezil (ARICEPT) 10 MG tablet Take 1 tablet (10 mg total) by mouth at bedtime. 04/09/19   Cottle, Billey Co., MD  fexofenadine (ALLEGRA) 180 MG tablet Take 1 tablet (180 mg total) by mouth daily. 180mg  QD 01/07/20   [name], MD  finasteride (PROSCAR) 5 MG tablet Take 1 tablet (5 mg total) by mouth daily. 5mg  QD 02/05/20   [name], MD  fluocinonide cream (LIDEX) [id number] % Apply 1 application topically 2 (two) times daily for 10 days. 02/11/20 02/21/20  [name], MD  FLUoxetine (PROZAC) 10 MG capsule Take 1 capsule (10 mg total) by mouth daily. 07/10/19   Cottle, Billey Co., MD  memantine (NAMENDA) 10 MG tablet Take 10 mg by mouth 2 (two) times daily. 11/15/19   [provider]   Menthol-Zinc Oxide (CALMOSEPTINE) 0.44-20.6 % OINT Apply 1 Act topically daily. Patient not taking: Reported on 02/11/2020 10/21/19   [name], MD  omeprazole (PRILOSEC) 40 MG capsule TAKE (1) CAPSULE DAILY. Patient taking differently: Take 40 mg by mouth daily. 03/11/19   [name], MD  Polyethylene Glycol 3350 (MIRALAX PO) Take by mouth.    [provider]  Probiotic Product ([place]) CAPS Take 1 capsule by mouth daily. 01/07/20   [name], MD  terbinafine (LAMISIL) 250 MG tablet Take 1 tablet (250 mg total) by mouth daily for 14 days. 02/11/20 02/25/20  [name], MD  vitamin B-12 (CYANOCOBALAMIN) 1000 MCG tablet Take 1 tablet (1,000 mcg total) by mouth daily. 01/07/20   [name], MD    Allergies    Iodine, Oxycodone, Pollen extract, Tamiflu [oseltamivir phosphate], Tamsulosin, Doxazosin, Etodolac, and Tape  Review of Systems   Review of Systems  Constitutional: Positive for activity change, appetite change, chills and fatigue. Negative for fever.  HENT: Positive for congestion and rhinorrhea. Negative for sore throat.   Respiratory: Positive for cough. Negative for choking and shortness of breath.   Cardiovascular: Negative for chest pain.  Gastrointestinal: Positive for abdominal pain. Negative for nausea and vomiting.  Genitourinary: Negative for dysuria.  Musculoskeletal: Positive for arthralgias and myalgias.  Skin: Positive for rash.  Neurological: Positive for weakness.   all other systems are negative except as noted in the HPI and PMH.    Physical Exam Updated Vital Signs BP (!) 193/105   Pulse 67   Temp 98.1 F (36.7 C) (Oral)   Resp 18   Ht 5\' 11"  (1.803 m)   Wt 75.4 kg   SpO2 99%   BMI 23.18 kg/m   Physical Exam Vitals and nursing note reviewed.  Constitutional:      General: He is not in acute distress.    Appearance: He is well-developed and well-nourished.     Comments: Fatigued appearing, non toxic   HENT:     Head: Normocephalic and atraumatic.     Mouth/Throat:     Mouth: Oropharynx is clear and moist.     Pharynx: No oropharyngeal exudate.  Eyes:     Extraocular Movements: EOM normal.     Conjunctiva/sclera: Conjunctivae normal.     Pupils: Pupils are equal, round, and reactive to light.  Neck:     Comments: No meningismus. Cardiovascular:     Rate and Rhythm: Normal rate and regular rhythm.     Pulses: Intact distal pulses.     Heart sounds: Normal heart sounds. No murmur heard.   Pulmonary:     Effort: Pulmonary effort is normal. No respiratory distress.     Breath sounds: Normal breath sounds.  Abdominal:     General: There is distension.     Palpations: Abdomen is soft.     Tenderness: There is no guarding or rebound.     Comments: Distended abdomen, mild diffuse tenderness.  Multiple masses palpated in the right side of the abdomen which daughter states are his polycystic kidneys  Musculoskeletal:        General: No tenderness or edema. Normal range of motion.     Cervical back: Normal range of motion and neck supple.  Skin:    General: Skin is warm.     Findings: Rash present.     Comments: Scaly erythematous rash to low back without surrounding cellulitis or vesicles  Neurological:     Mental Status: He is alert and oriented to person, place, and time.     Cranial Nerves: No cranial nerve deficit.     Motor: No abnormal muscle tone.     Coordination: Coordination normal.     Comments: Fatigued appearing, somewhat slow to respond, follows all commands and move extremities equally.  No facial droop.  Psychiatric:        Mood and Affect: Mood and affect normal.        Behavior: Behavior normal.     ED Results / Procedures / Treatments   Labs (all labs ordered are listed, but only abnormal results are displayed) Labs Reviewed  URINALYSIS, ROUTINE W REFLEX MICROSCOPIC - Abnormal; Notable for the following components:      Result Value   APPearance HAZY (*)     Hgb urine dipstick SMALL (*)    Protein, ur 100 (*)    Leukocytes,Ua TRACE (*)    All other components within normal limits  URINALYSIS, MICROSCOPIC (REFLEX) - Abnormal; Notable for the following components:   Bacteria, UA FEW (*)    All other components within normal limits  CBC WITH DIFFERENTIAL/PLATELET - Abnormal; Notable for the following components:   RBC 3.85 (*)    Hemoglobin 12.0 (*)    HCT 37.0 (*)    All other components within normal limits  COMPREHENSIVE METABOLIC PANEL - Abnormal; Notable for the following components:   Glucose, Bld 113 (*)    BUN 24 (*)    Creatinine, Ser 1.41 (*)    Calcium 8.4 (*)    GFR, Estimated 50 (*)    All other components within normal limits  SARS CORONAVIRUS 2 BY RT PCR (HOSPITAL ORDER, [place] [facility])  URINE CULTURE  TROPONIN I (HIGH SENSITIVITY)  TROPONIN I (HIGH SENSITIVITY)    EKG EKG Interpretation  Date/Time:  Tuesday February 16 2020 22:10:24 EST Ventricular Rate:  62 PR Interval:    QRS Duration: 99 QT Interval:  425 QTC Calculation: 432 R Axis:   -47 Text Interpretation: Age not entered, assumed to be  80 years old for purpose of ECG interpretation Sinus rhythm Short PR interval Left ventricular hypertrophy Inferior infarct, old Anterior infarct, old Nonspecific T wave abnormality Confirmed by Ezequiel Essex [phone]) on 02/16/2020 10:16:08 PM   Radiology DG Chest Port 1 View  Result Date: 02/16/2020 CLINICAL DATA:  Headache, body aches, and congestion. Positive for COVID on January 10th. EXAM: PORTABLE CHEST 1 VIEW COMPARISON:  12/25/2019 FINDINGS: Shallow inspiration. Heart size and pulmonary vascularity are normal for technique. Infiltration or atelectasis suggested in the right lung base. No pleural effusions. No pneumothorax. Mediastinal contours appear intact. IMPRESSION: Infiltration or atelectasis in the right lung base. Electronically Signed   By: Lucienne Capers M.D.   On: 02/16/2020 22:19     Procedures Procedures   Medications Ordered in ED Medications - No data to display  ED Course  I have reviewed the triage vital signs and the nursing notes.  Pertinent labs & imaging results that were available during my care of the patient were reviewed by me and considered in my medical decision making (see chart for details).    MDM Rules/Calculators/A&P                         2 days of fatigue, chills, body aches, congestion.  Daughter reports more slow to respond today with increased weakness, chills, cough, body aches, headache and dizziness.  X-ray urgent care concerning for possible pneumonia.  Blood pressure elevated with no history of hypertension.  Work-up shows stable creatinine.  Urinalysis with pyuria.  Culture is sent.  Right basilar atelectasis versus infiltrate.  CT head is negative and stable.  CT abdomen shows polycystic kidneys with stool burden otherwise negative. Right basilar pneumonia not appreciated.  Patient remains somewhat slow to respond and fatigued.  He is able to tolerate p.o.  Admission versus discharge discussed with patient and daughter at bedside.  He lives at assisted living.  He feels generally weak and does not appear to be at his baseline per his daughter.  Observation admission anticipated for  IV fluids and antibiotics overnight.  We will treat both possible UTI and possible pneumonia.  Covid is negative.  Hospitalist call pending at shift change    Henry Adams was evaluated in Emergency Department on 02/16/2020 for the symptoms described in the history of present illness. He was evaluated in the context of the global COVID-19 pandemic, which necessitated consideration that the patient might be at risk for infection with the SARS-CoV-2 virus that causes COVID-19. Institutional protocols and algorithms that pertain to the evaluation of patients at risk for COVID-19 are in a state of rapid change based on information released by regulatory  bodies including the CDC and federal and state organizations. These policies and algorithms were followed during the patient's care in the ED.  Final Clinical Impression(s) / ED Diagnoses Final diagnoses:  Pyuria  Altered mental status, unspecified altered mental status type    Rx / DC Orders ED Discharge Orders    None       Amadea Keagy, Annie Main, MD 02/17/20 0006

## 2020-02-17 ENCOUNTER — Other Ambulatory Visit (HOSPITAL_BASED_OUTPATIENT_CLINIC_OR_DEPARTMENT_OTHER): Payer: Self-pay | Admitting: Emergency Medicine

## 2020-02-17 LAB — TROPONIN I (HIGH SENSITIVITY): Troponin I (High Sensitivity): 7 ng/L (ref <18)

## 2020-02-17 MED ORDER — CARBOXYMETHYLCELLULOSE SODIUM 0.5 % OP SOLN: 1.0000 [drp] | Freq: Three times a day (TID) | OPHTHALMIC | Status: DC | PRN

## 2020-02-17 MED ORDER — CARBIDOPA-LEVODOPA ER 25-100 MG PO TBCR
1.5000 | EXTENDED_RELEASE_TABLET | Freq: Every day | ORAL | Status: DC
  Filled 2020-02-17: qty 1.5

## 2020-02-17 MED ORDER — ACETAMINOPHEN 325 MG PO TABS
650.0000 mg | ORAL_TABLET | Freq: Once | ORAL | Status: AC
  Administered 2020-02-17: 650 mg via ORAL
  Filled 2020-02-17: qty 2

## 2020-02-17 MED ORDER — LORATADINE 10 MG PO TABS
10.0000 mg | ORAL_TABLET | Freq: Every day | ORAL | Status: DC
  Filled 2020-02-17: qty 1

## 2020-02-17 MED ORDER — AZITHROMYCIN 250 MG PO TABS: 500.0000 mg | ORAL_TABLET | Freq: Every day | ORAL | 0 refills | Status: DC

## 2020-02-17 MED ORDER — ALFUZOSIN HCL ER 10 MG PO TB24
10.0000 mg | ORAL_TABLET | Freq: Two times a day (BID) | ORAL | Status: DC
  Filled 2020-02-17: qty 1

## 2020-02-17 MED ORDER — CARBIDOPA-LEVODOPA ER 25-100 MG PO TBCR
1.0000 | EXTENDED_RELEASE_TABLET | Freq: Every day | ORAL | Status: DC
  Filled 2020-02-17: qty 1

## 2020-02-17 MED ORDER — CEFUROXIME AXETIL 500 MG PO TABS: 500.0000 mg | ORAL_TABLET | Freq: Two times a day (BID) | ORAL | 0 refills | Status: DC

## 2020-02-17 MED ORDER — VITAMIN B-12 1000 MCG PO TABS
1000.0000 ug | ORAL_TABLET | Freq: Every day | ORAL | Status: DC
  Filled 2020-02-17: qty 1

## 2020-02-17 MED ORDER — CLONAZEPAM 0.5 MG PO TABS
0.5000 mg | ORAL_TABLET | Freq: Every day | ORAL | Status: DC
  Filled 2020-02-17: qty 1

## 2020-02-17 MED ORDER — TERBINAFINE HCL 250 MG PO TABS
250.0000 mg | ORAL_TABLET | Freq: Every day | ORAL | Status: DC
  Filled 2020-02-17: qty 1

## 2020-02-17 MED ORDER — CARBIDOPA-LEVODOPA ER 25-100 MG PO TBCR
1.0000 | EXTENDED_RELEASE_TABLET | ORAL | Status: DC
  Filled 2020-02-17: qty 1

## 2020-02-17 MED ORDER — MEMANTINE HCL 10 MG PO TABS
10.0000 mg | ORAL_TABLET | Freq: Two times a day (BID) | ORAL | Status: DC
  Filled 2020-02-17: qty 1

## 2020-02-17 MED ORDER — DONEPEZIL HCL 10 MG PO TABS
10.0000 mg | ORAL_TABLET | Freq: Every day | ORAL | Status: DC
  Filled 2020-02-17: qty 1

## 2020-02-17 MED ORDER — PANTOPRAZOLE SODIUM 40 MG PO TBEC
40.0000 mg | DELAYED_RELEASE_TABLET | Freq: Every day | ORAL | Status: DC
  Filled 2020-02-17: qty 1

## 2020-02-17 MED ORDER — CARBIDOPA-LEVODOPA ER 25-100 MG PO TBCR
0.5000 | EXTENDED_RELEASE_TABLET | Freq: Every day | ORAL | Status: DC
  Filled 2020-02-17: qty 0.5

## 2020-02-17 MED ORDER — FLUOXETINE HCL 10 MG PO CAPS
10.0000 mg | ORAL_CAPSULE | Freq: Every day | ORAL | Status: DC
  Filled 2020-02-17: qty 1

## 2020-02-17 MED ORDER — FINASTERIDE 5 MG PO TABS
5.0000 mg | ORAL_TABLET | Freq: Every day | ORAL | Status: DC
  Filled 2020-02-17: qty 1

## 2020-02-17 MED FILL — AZITHROMYCIN 250 MG TABS: 250 | 4 days supply | Qty: 8 | Fill #0

## 2020-02-17 MED FILL — CEFUROXIME AXETIL 500 MG TA: 500 | 5 days supply | Qty: 10 | Fill #0

## 2020-02-17 NOTE — ED Notes (Signed)
Prior to this RN arrival to department, daughter asked if it was ok to give medications as they were pre-packaged from facility. Provider said was ok to give. Daughter now coming out to speak with this nurse about when more meds are due. Informed daughter to allow [place] to give meds when they are due to avoid confusion, however we do not yet have most of his meds as they will be delivered from [facility]. Daughter unsure of which meds she gave this morning, states gave the meds due at 0600 around 0800. Informed her of which meds are due at 2pm. Reviewed medication list for about 30 mins with daughter. Spoke with other daughter on phone as she is asking if pt needs to be transferred as he was here overnight and the night provider said he was being admitted for observation overnight and was "on the fence" about admission. This writer was not here overnight and unsure of pt status overnight. Informed daughters that he should continue to be admitted as he hasnt improved and should see the hospitalist. Secure chatted Dr. Tyrone Nine, ER provider to see if he had any recommendations.

## 2020-02-17 NOTE — ED Notes (Signed)
Pt eating lunch at this time

## 2020-02-17 NOTE — ED Notes (Signed)
Family states that pt is becoming more altered at this time. Nurse had assistance to try and get temperature on pt. Pts hand had to be held due to pt trying to grab and swat at nurse while preforming tympanic temperature. Provider updated about pt and gave a verbal order for tylenol.

## 2020-02-17 NOTE — ED Notes (Signed)
Patient resting in bed with no distress noted or complaints voiced.  Daughter at the bedside.  Oatmeal and applesauce provided for breakfast.  Home meds given with okay from Dr. Tyrone Nine.  Daughter encouraged to call for assistance as needed.

## 2020-02-17 NOTE — Discharge Instructions (Signed)
We had offered to put you into the hospital for continued antibiotic therapy for your likely urinary tract infection and possible pneumonia.  You have elected to try and go home.  Please return at anytime you feel like you are unsafe at home or if you are unable to eat or drink to take your antibiotics.  Please return for worsening confusion or difficulty breathing.

## 2020-02-17 NOTE — Telephone Encounter (Signed)
Noted  

## 2020-02-17 NOTE — ED Provider Notes (Signed)
I assumed care of the patient at 0 700.  Briefly the patient is a 79 year old male awaiting admission for urinary tract infection and possibly pneumonia.  Too fatigued to go home.  Family is here with him today and feels like they would like to try and take him back to his assisted living facility.  I discussed with them risks and benefits.  They are electing for trial of home therapy.  I discussed the case with the pharmacist who recommended giving azithromycin 500 mg orally for the next 4 days and Ceftin 500 mg twice daily for 5 days.   Deno Etienne, DO 02/17/20 1334

## 2020-02-18 ENCOUNTER — Ambulatory Visit (INDEPENDENT_AMBULATORY_CARE_PROVIDER_SITE_OTHER): Payer: Medicare Other

## 2020-02-18 ENCOUNTER — Ambulatory Visit: Payer: Medicare Other | Admitting: Psychiatry

## 2020-02-18 ENCOUNTER — Encounter: Payer: Self-pay | Admitting: Internal Medicine

## 2020-02-18 DIAGNOSIS — G20 Parkinson's disease: Secondary | ICD-10-CM | POA: Diagnosis not present

## 2020-02-18 DIAGNOSIS — M6281 Muscle weakness (generalized): Secondary | ICD-10-CM | POA: Diagnosis not present

## 2020-02-18 DIAGNOSIS — Z0000 Encounter for general adult medical examination without abnormal findings: Secondary | ICD-10-CM | POA: Diagnosis not present

## 2020-02-18 DIAGNOSIS — R278 Other lack of coordination: Secondary | ICD-10-CM | POA: Diagnosis not present

## 2020-02-18 LAB — URINE CULTURE: Culture: <10000 — AB

## 2020-02-18 NOTE — Progress Notes (Signed)
I connected with Henry Adams today by telephone and verified that I am speaking with the correct person using two identifiers. Location patient: home Location provider: work Persons participating in the virtual visit: Henry Adams, Henry Adams (daughter, per [facility]) and Henry Abu, LPN.   I discussed the limitations, risks, security and privacy concerns of performing an evaluation and management service by telephone and the availability of in person appointments. I also discussed with the patient that there may be a patient responsible charge related to this service. The patient expressed understanding and verbally consented to this telephonic visit.    Interactive audio and video telecommunications were attempted between this provider and patient, however failed, due to patient having technical difficulties OR patient did not have access to video capability.  We continued and completed visit with audio only.  Some vital signs may be absent or patient reported.   Time Spent with patient on telephone encounter: 30 minutes  Subjective:   Henry Adams is a 80 y.o. male who presents for Medicare Annual/Subsequent preventive examination.  Review of Systems    No ROS. Medicare Wellness Visit. Additional risk factors are reflected in social history. Cardiac Risk Factors include: advanced age (>56men, >90 women);dyslipidemia;hypertension;family history of premature cardiovascular disease;male gender     Objective:    Today's Vitals   02/18/20 1331  PainSc: 0-No pain   There is no height or weight on file to calculate BMI.  Advanced Directives 02/16/2020 12/25/2019 11/30/2019 05/29/2019 05/22/2019 07/23/2018 06/26/2018  Does Patient Have a Medical Advance Directive? Yes Unable to assess, patient is non-responsive or altered mental status Yes Yes Yes Yes Yes  Type of Advance Directive [place];Living will - [place];Living will [place];Living will Healthcare Power of [place];Living will [place];Living will  Does patient want to make changes to medical advance directive? - - - - - - -  Copy of [place] in Chart? - - - - - - -  Would patient like information on creating a medical advance directive? No - Patient declined - - - - - -    Current Medications (verified) Outpatient Encounter Medications as of 02/18/2020  Medication Sig  . acetaminophen (TYLENOL) 500 MG tablet 500 mg prn for back, knee, shoulder or head pain. May be given up to every 6 hours.  [name] alfuzosin (UROXATRAL) 10 MG 24 hr tablet Take 1 tablet (10 mg total) by mouth 2 (two) times daily. 10 mg at 7 am and 7 pm daily.  [name] azithromycin (ZITHROMAX) 250 MG tablet Take 2 tablets (500 mg total) by mouth daily for 4 days. Take first 2 tablets together, then 1 every day until finished.  . Carbidopa-Levodopa ER (SINEMET CR) 25-100 MG tablet controlled release TAKE ONE TABLET AT BEDTIME.  . Carbidopa-Levodopa ER (SINEMET CR) 25-100 MG tablet controlled release Take 1 tablet by mouth. Take 1 tab at 6am/10am/2pm/ 1.5 tab at 6pm  . carboxymethylcellulose (REFRESH PLUS) 0.5 % SOLN 1 drop 3 (three) times daily as needed.  . cefUROXime (CEFTIN) 500 MG tablet Take 1 tablet (500 mg total) by mouth 2 (two) times daily with a meal for 5 days.  . clonazePAM (KLONOPIN) 0.5 MG tablet Take 1 tablet (0.5 mg total) by mouth at bedtime.  . donepezil (ARICEPT) 10 MG tablet Take 1 tablet (10 mg total) by mouth at bedtime.  . fexofenadine (ALLEGRA) 180 MG tablet Take 1 tablet (180  mg total) by mouth daily. 180mg  QD  . finasteride (PROSCAR) 5 MG tablet Take 1 tablet (5 mg total) by mouth daily. 5mg  QD  . fluocinonide cream (LIDEX) 3.53 % Apply 1 application topically 2 (two) times daily for 10 days.  [name] FLUoxetine (PROZAC) 10 MG capsule Take 1 capsule (10 mg total) by mouth daily.  . meclizine (ANTIVERT) 12.5 MG  tablet Take 12.5 mg by mouth 3 (three) times daily as needed for dizziness.  . memantine (NAMENDA) 10 MG tablet Take 10 mg by mouth 2 (two) times daily.  . Menthol-Zinc Oxide (CALMOSEPTINE) 0.44-20.6 % OINT Apply 1 Act topically daily. (Patient not taking: Reported on 02/11/2020)  . omeprazole (PRILOSEC) 40 MG capsule TAKE (1) CAPSULE DAILY. (Patient taking differently: Take 40 mg by mouth daily.)  . Polyethylene Glycol 3350 (MIRALAX PO) Take by mouth.  . Probiotic Product (Canones) CAPS Take 1 capsule by mouth daily.  [name] terbinafine (LAMISIL) 250 MG tablet Take 1 tablet (250 mg total) by mouth daily for 14 days.  . vitamin B-12 (CYANOCOBALAMIN) 1000 MCG tablet Take 1 tablet (1,000 mcg total) by mouth daily.   No facility-administered encounter medications on file as of 02/18/2020.    Allergies (verified) Iodine, Oxycodone, Pollen extract, Tamiflu [oseltamivir phosphate], Tamsulosin, Doxazosin, Etodolac, and Tape   History: Past Medical History:  Diagnosis Date  . Allergy    perennial  . Anxiety   . Arthritis   . Cholelithiasis   . Chronic idiopathic constipation   . CKD (chronic kidney disease)   . Depression   . Diverticulosis   . Enlarged prostate   . Gallstones   . GERD (gastroesophageal reflux disease)   . HLD (hyperlipidemia)   . Hx of cardiovascular stress test    ETT-Myoview 6/14: probable low risk study, inf defect with normal inf wall motion-cannot r/o ischemia; prominent gut uptake adjacent to inf wall on rest images-poss defect is atten, EF 63%  . Hypertension   . Melanoma ([place])   . Parkinsonism ([place])   . Polycystic kidney disease    Past Surgical History:  Procedure Laterality Date  . COLONOSCOPY  2011   diverticulosis  . HERNIA REPAIR     umbilical  . KNEE SURGERY Left   . medialization laryngoplasty  10/15/2013   Dr Joya Gaskins, [facility]  . TONSILLECTOMY AND ADENOIDECTOMY     Family History  Problem Relation Age of Onset  . Hypertension Father   .  Cancer Brother        mouth  . Cancer Sister        breast  . Heart disease Brother        heart transplant  . Cancer Daughter        breast  . Stroke Sister        >53  . Skin cancer Brother        squamous cell  . Colon cancer Neg Hx    Social History   Socioeconomic History  . Marital status: Married    Spouse name: Not on file  . Number of children: 2  . Years of education: Not on file  . Highest education level: Not on file  Occupational History  . 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 Smoking status: Never Smoker  . Smokeless tobacco: Never Used  Vaping Use  . Vaping Use: Never used  Substance and Sexual Activity  . Alcohol use: Not Currently    Alcohol/week:  0.0 standard drinks  . Drug use: No  . Sexual activity: Not on file  Other Topics Concern  . Not on file  Social History Narrative  . Not on file   Social Determinants of Health   Financial Resource Strain: Low Risk   . Difficulty of Paying Living Expenses: Not hard at all  Food Insecurity: No Food Insecurity  . Worried About Charity fundraiser in the Last Year: Never true  . Ran Out of Food in the Last Year: Never true  Transportation Needs: No Transportation Needs  . Lack of Transportation (Medical): No  . Lack of Transportation (Non-Medical): No  Physical Activity: Inactive  . Days of Exercise per Week: 0 days  . Minutes of Exercise per Session: 0 min  Stress: No Stress Concern Present  . Feeling of Stress : Not at all  Social Connections: Socially Isolated  . Frequency of Communication with Friends and Family: More than three times a week  . Frequency of Social Gatherings with Friends and Family: More than three times a week  . Attends Religious Services: Never  . Active Member of Clubs or Organizations: No  . Attends Archivist Meetings: Never  . Marital Status: Widowed    Tobacco Counseling Counseling given: Not  Answered   Clinical Intake:  Pre-visit preparation completed: Yes  Pain : No/denies pain Pain Score: 0-No pain     Diabetes: No  How often do you need to have someone help you when you read instructions, pamphlets, or other written materials from your doctor or pharmacy?: 1 - Never What is the last grade level you completed in school?: Bachelor's Degree  Diabetic? no  Interpreter Needed?: No  Information entered by :: Adrain Butrick N. Athziri Freundlich, LPN   Activities of Daily Living In your present state of health, do you have any difficulty performing the following activities: 02/18/2020 12/25/2019  Hearing? N Y  Comment wears hearing aids -  Vision? N N  Difficulty concentrating or making decisions? Tempie Donning  Walking or climbing stairs? Y Y  Dressing or bathing? Y Y  Doing errands, shopping? Y N  Preparing Food and eating ? Y -  Using the Toilet? Y -  In the past six months, have you accidently leaked urine? N -  Do you have problems with loss of bowel control? N -  Managing your Medications? Y -  Managing your Finances? Y -  Housekeeping or managing your Housekeeping? Y -  Some recent data might be hidden    Patient Care Team: [name], MD as PCP - General (Internal Medicine)  Indicate any recent Medical Services you may have received from other than [facility] providers in the past year (date may be approximate).     Assessment:   This is a routine wellness examination for Nasair.  Hearing/Vision screen No exam data present  Dietary issues and exercise activities discussed: Current Exercise Habits: The patient does not participate in regular exercise at present, Exercise limited by: Other - see comments;psychological condition(s) (restless leg syndrome)  Goals   None    Depression Screen PHQ 2/9 Scores 02/18/2020 12/25/2019 05/29/2019 06/25/2018 03/27/2018 12/25/2017 09/18/2017  PHQ - 2 Score 0 0 2 0 2 1 3   PHQ- 9 Score 0 0 5 - 5 7 9     Fall Risk Fall Risk  02/18/2020  11/30/2019 05/29/2019 07/23/2018 06/26/2018  Falls in the past year? 1 0 0 0 1  Number falls in past yr: 0 0 0  0 1  Injury with Fall? 1 0 0 0 0  Risk for fall due to : Impaired balance/gait - - - -  Follow up - - - - -    FALL RISK PREVENTION PERTAINING TO THE HOME:  Any stairs in or around the home? No  If so, are there any without handrails? No  Home free of loose throw rugs in walkways, pet beds, electrical cords, etc? Yes  Adequate lighting in your home to reduce risk of falls? Yes   ASSISTIVE DEVICES UTILIZED TO PREVENT FALLS:  Life alert? Yes  Use of a cane, walker or w/c? Yes  Grab bars in the bathroom? Yes  Shower chair or bench in shower? Yes  Elevated toilet seat or a handicapped toilet? Yes   TIMED UP AND GO:  Was the test performed? No .  Length of time to ambulate 10 feet: 0 sec.   Gait slow and steady with assistive device  Cognitive Function:  MMSE - Mini Mental State Exam 07/01/2017  Not completed: Unable to complete   [facility] Cognitive Assessment  07/23/2018 02/04/2017  Visuospatial/ Executive (0/5) 2 3  Naming (0/3) 2 1  Attention: Read list of digits (0/2) 1 2  Attention: Read list of letters (0/1) 1 1  Attention: Serial 7 subtraction starting at 100 (0/3) 3 1  Language: Repeat phrase (0/2) 0 2  Language : Fluency (0/1) 0 0  Abstraction (0/2) 1 2  Delayed Recall (0/5) 0 2  Orientation (0/6) 5 6  Total 15 20  Adjusted Score (based on education) 15 20      MMSE - Mini Mental State Exam 07/01/2017  Not completed: Unable to complete   [facility] Cognitive Assessment  07/23/2018 02/04/2017  Visuospatial/ Executive (0/5) 2 3  Naming (0/3) 2 1  Attention: Read list of digits (0/2) 1 2  Attention: Read list of letters (0/1) 1 1  Attention: Serial 7 subtraction starting at 100 (0/3) 3 1  Language: Repeat phrase (0/2) 0 2  Language : Fluency (0/1) 0 0  Abstraction (0/2) 1 2  Delayed Recall (0/5) 0 2  Orientation (0/6) 5 6  Total 15 20  Adjusted Score (based  on education) 15 20      Immunizations Immunization History  Administered Date(s) Administered  . Fluad Quad(high Dose 65+) 09/11/2018, 10/05/2019  . Influenza Whole 12/05/2009  . Influenza, High Dose Seasonal PF 11/26/2012, 11/27/2015, 10/24/2016  . Influenza,inj,Quad PF,6+ Mos 09/18/2017  . Influenza-Unspecified 10/22/2014  . PFIZER(Purple Top)SARS-COV-2 Vaccination 02/03/2019, 02/23/2019, 11/09/2019  . PPD Test 04/08/2019  . Pneumococcal Conjugate-13 07/23/2014  . Pneumococcal Polysaccharide-23 10/15/2012  . Tdap 07/23/2014  . Zoster 12/14/2009    TDAP status: Up to date  Flu Vaccine status: Up to date  Pneumococcal vaccine status: Up to date  Covid-19 vaccine status: Completed vaccines  Qualifies for Shingles Vaccine? Yes   Zostavax completed Yes   Shingrix Completed?: No.    Education has been provided regarding the importance of this vaccine. Patient has been advised to call insurance company to determine out of pocket expense if they have not yet received this vaccine. Advised may also receive vaccine at local pharmacy or Health Dept. Verbalized acceptance and understanding.  Screening Tests Health Maintenance  Topic Date Due  . TETANUS/TDAP  07/22/2024  . INFLUENZA VACCINE  Completed  . COVID-19 Vaccine  Completed  . PNA vac Low Risk Adult  Completed    Health Maintenance  There are no preventive care reminders to display for  this patient.  Colorectal cancer screening: No longer required.   Lung Cancer Screening: (Low Dose CT Chest recommended if Age 73-80 years, 30 pack-year currently smoking OR have quit w/in 15years.) does not qualify.   Lung Cancer Screening Referral: no  Additional Screening:  Hepatitis C Screening: does not qualify; Completed no  Vision Screening: Recommended annual ophthalmology exams for early detection of glaucoma and other disorders of the eye. Is the patient up to date with their annual eye exam?  Yes  Who is the provider or  what is the name of the office in which the patient attends annual eye exams? [facility] If pt is not established with a provider, would they like to be referred to a provider to establish care? No .   Dental Screening: Recommended annual dental exams for proper oral hygiene  Community Resource Referral / Chronic Care Management: CRR required this visit?  No   CCM required this visit?  No      Plan:     I have personally reviewed and noted the following in the patient's chart:   . Medical and social history . Use of alcohol, tobacco or illicit drugs  . Current medications and supplements . Functional ability and status . Nutritional status . Physical activity . Advanced directives . List of other physicians . Hospitalizations, surgeries, and ER visits in previous 12 months . Vitals . Screenings to include cognitive, depression, and falls . Referrals and appointments  In addition, I have reviewed and discussed with patient certain preventive protocols, quality metrics, and best practice recommendations. A written personalized care plan for preventive services as well as general preventive health recommendations were provided to patient.     Sheral Flow, LPN   [id number]   Nurse Notes:  There were no vitals filed for this visit. There is no height or weight on file to calculate BMI. Cognitive status: Patient is unable to complete screening 6CIT or MMSE.

## 2020-02-18 NOTE — Patient Instructions (Addendum)
Mr. Henry Adams , Thank you for taking time to come for your Medicare Wellness Visit. I appreciate your ongoing commitment to your health goals. Please review the following plan we discussed and let me know if I can assist you in the future.   Screening recommendations/referrals: Colonoscopy: not a candidate for colon cancer screening due to age Recommended yearly ophthalmology/optometry visit for glaucoma screening and checkup Recommended yearly dental visit for hygiene and checkup  Vaccinations: Influenza vaccine: 10/05/2019 Pneumococcal vaccine: up to date Tdap vaccine: 07/23/2014; due every 10 years Shingles vaccine: never done   Covid-19: up to date  Advanced directives: Please bring a copy of your health care power of attorney and living will to the office at your convenience.  Conditions/risks identified: Yes; conditions and risks were identified with daughter, [name] (per DPR).  Next appointment: Please schedule your next Medicare Wellness Visit with your Nurse Health Advisor in 1 year by calling [phone].  Preventive Care 110 Years and Older, Male Preventive care refers to lifestyle choices and visits with your health care provider that can promote health and wellness. What does preventive care include?  A yearly physical exam. This is also called an annual well check.  Dental exams once or twice a year.  Routine eye exams. Ask your health care provider how often you should have your eyes checked.  Personal lifestyle choices, including:  Daily care of your teeth and gums.  Regular physical activity.  Eating a healthy diet.  Avoiding tobacco and drug use.  Limiting alcohol use.  Practicing safe sex.  Taking low doses of aspirin every day.  Taking vitamin and mineral supplements as recommended by your health care provider. What happens during an annual well check? The services and screenings done by your health care provider during your annual well check will depend on  your age, overall health, lifestyle risk factors, and family history of disease. Counseling  Your health care provider may ask you questions about your:  Alcohol use.  Tobacco use.  Drug use.  Emotional well-being.  Home and relationship well-being.  Sexual activity.  Eating habits.  History of falls.  Memory and ability to understand (cognition).  Work and work Statistician. Screening  You may have the following tests or measurements:  Height, weight, and BMI.  Blood pressure.  Lipid and cholesterol levels. These may be checked every 5 years, or more frequently if you are over 106 years old.  Skin check.  Lung cancer screening. You may have this screening every year starting at age 1 if you have a 30-pack-year history of smoking and currently smoke or have quit within the past 15 years.  Fecal occult blood test (FOBT) of the stool. You may have this test every year starting at age 21.  Flexible sigmoidoscopy or colonoscopy. You may have a sigmoidoscopy every 5 years or a colonoscopy every 10 years starting at age 40.  Prostate cancer screening. Recommendations will vary depending on your family history and other risks.  Hepatitis C blood test.  Hepatitis B blood test.  Sexually transmitted disease (STD) testing.  Diabetes screening. This is done by checking your blood sugar (glucose) after you have not eaten for a while (fasting). You may have this done every 1-3 years.  Abdominal aortic aneurysm (AAA) screening. You may need this if you are a current or former smoker.  Osteoporosis. You may be screened starting at age 63 if you are at high risk. Talk with your health care provider about your test results,  treatment options, and if necessary, the need for more tests. Vaccines  Your health care provider may recommend certain vaccines, such as:  Influenza vaccine. This is recommended every year.  Tetanus, diphtheria, and acellular pertussis (Tdap, Td) vaccine.  You may need a Td booster every 10 years.  Zoster vaccine. You may need this after age 50.  Pneumococcal 13-valent conjugate (PCV13) vaccine. One dose is recommended after age 67.  Pneumococcal polysaccharide (PPSV23) vaccine. One dose is recommended after age 84. Talk to your health care provider about which screenings and vaccines you need and how often you need them. This information is not intended to replace advice given to you by your health care provider. Make sure you discuss any questions you have with your health care provider. Document Released: 02/04/2015 Document Revised: 09/28/2015 Document Reviewed: 11/09/2014 Elsevier Interactive Patient Education  2017 [employer] Prevention in the Home Falls can cause injuries. They can happen to people of all ages. There are many things you can do to make your home safe and to help prevent falls. What can I do on the outside of my home?  Regularly fix the edges of walkways and driveways and fix any cracks.  Remove anything that might make you trip as you walk through a door, such as a raised step or threshold.  Trim any bushes or trees on the path to your home.  Use bright outdoor lighting.  Clear any walking paths of anything that might make someone trip, such as rocks or tools.  Regularly check to see if handrails are loose or broken. Make sure that both sides of any steps have handrails.  Any raised decks and porches should have guardrails on the edges.  Have any leaves, snow, or ice cleared regularly.  Use sand or salt on walking paths during winter.  Clean up any spills in your garage right away. This includes oil or grease spills. What can I do in the bathroom?  Use night lights.  Install grab bars by the toilet and in the tub and shower. Do not use towel bars as grab bars.  Use non-skid mats or decals in the tub or shower.  If you need to sit down in the shower, use a plastic, non-slip stool.  Keep the  floor dry. Clean up any water that spills on the floor as soon as it happens.  Remove soap buildup in the tub or shower regularly.  Attach bath mats securely with double-sided non-slip rug tape.  Do not have throw rugs and other things on the floor that can make you trip. What can I do in the bedroom?  Use night lights.  Make sure that you have a light by your bed that is easy to reach.  Do not use any sheets or blankets that are too big for your bed. They should not hang down onto the floor.  Have a firm chair that has side arms. You can use this for support while you get dressed.  Do not have throw rugs and other things on the floor that can make you trip. What can I do in the kitchen?  Clean up any spills right away.  Avoid walking on wet floors.  Keep items that you use a lot in easy-to-reach places.  If you need to reach something above you, use a strong step stool that has a grab bar.  Keep electrical cords out of the way.  Do not use floor polish or wax that makes floors  slippery. If you must use wax, use non-skid floor wax.  Do not have throw rugs and other things on the floor that can make you trip. What can I do with my stairs?  Do not leave any items on the stairs.  Make sure that there are handrails on both sides of the stairs and use them. Fix handrails that are broken or loose. Make sure that handrails are as long as the stairways.  Check any carpeting to make sure that it is firmly attached to the stairs. Fix any carpet that is loose or worn.  Avoid having throw rugs at the top or bottom of the stairs. If you do have throw rugs, attach them to the floor with carpet tape.  Make sure that you have a light switch at the top of the stairs and the bottom of the stairs. If you do not have them, ask someone to add them for you. What else can I do to help prevent falls?  Wear shoes that:  Do not have high heels.  Have rubber bottoms.  Are comfortable and fit  you well.  Are closed at the toe. Do not wear sandals.  If you use a stepladder:  Make sure that it is fully opened. Do not climb a closed stepladder.  Make sure that both sides of the stepladder are locked into place.  Ask someone to hold it for you, if possible.  Clearly mark and make sure that you can see:  Any grab bars or handrails.  First and last steps.  Where the edge of each step is.  Use tools that help you move around (mobility aids) if they are needed. These include:  Canes.  Walkers.  Scooters.  Crutches.  Turn on the lights when you go into a dark area. Replace any light bulbs as soon as they burn out.  Set up your furniture so you have a clear path. Avoid moving your furniture around.  If any of your floors are uneven, fix them.  If there are any pets around you, be aware of where they are.  Review your medicines with your doctor. Some medicines can make you feel dizzy. This can increase your chance of falling. Ask your doctor what other things that you can do to help prevent falls. This information is not intended to replace advice given to you by your health care provider. Make sure you discuss any questions you have with your health care provider. Document Released: 11/04/2008 Document Revised: 06/16/2015 Document Reviewed: 02/12/2014 Elsevier Interactive Patient Education  2017 [employer].

## 2020-02-21 ENCOUNTER — Encounter: Payer: Self-pay | Admitting: Internal Medicine

## 2020-02-22 ENCOUNTER — Ambulatory Visit (INDEPENDENT_AMBULATORY_CARE_PROVIDER_SITE_OTHER): Payer: Medicare Other

## 2020-02-22 ENCOUNTER — Other Ambulatory Visit: Payer: Self-pay

## 2020-02-22 ENCOUNTER — Ambulatory Visit: Payer: Medicare Other | Admitting: Internal Medicine

## 2020-02-22 ENCOUNTER — Ambulatory Visit (INDEPENDENT_AMBULATORY_CARE_PROVIDER_SITE_OTHER): Payer: Medicare Other | Admitting: Family

## 2020-02-22 ENCOUNTER — Encounter: Payer: Self-pay | Admitting: Family

## 2020-02-22 ENCOUNTER — Other Ambulatory Visit: Payer: Self-pay | Admitting: Family

## 2020-02-22 VITALS — BP 136/84 | HR 67 | Temp 97.9°F | Ht 71.0 in | Wt 167.0 lb

## 2020-02-22 DIAGNOSIS — R410 Disorientation, unspecified: Secondary | ICD-10-CM

## 2020-02-22 DIAGNOSIS — J189 Pneumonia, unspecified organism: Secondary | ICD-10-CM

## 2020-02-22 DIAGNOSIS — R300 Dysuria: Secondary | ICD-10-CM

## 2020-02-22 LAB — COMPREHENSIVE METABOLIC PANEL
ALT: 5 U/L (ref 0–53)
AST: 16 U/L (ref 0–37)
Albumin: 3.7 g/dL (ref 3.5–5.2)
Alkaline Phosphatase: 71 U/L (ref 39–117)
BUN: 27 mg/dL — ABNORMAL HIGH (ref 6–23)
CO2: 29 mEq/L (ref 19–32)
Calcium: 8.8 mg/dL (ref 8.4–10.5)
Chloride: 105 mEq/L (ref 96–112)
Creatinine, Ser: 1.53 mg/dL — ABNORMAL HIGH (ref 0.40–1.50)
GFR: 42.23 mL/min — ABNORMAL LOW (ref >60.00)
Glucose, Bld: 94 mg/dL (ref 70–99)
Potassium: 4.1 mEq/L (ref 3.5–5.1)
Sodium: 138 mEq/L (ref 135–145)
Total Bilirubin: 0.5 mg/dL (ref 0.2–1.2)
Total Protein: 7 g/dL (ref 6.0–8.3)

## 2020-02-22 LAB — CBC WITH DIFFERENTIAL/PLATELET
Basophils Absolute: 0 10*3/uL (ref 0.0–0.1)
Basophils Relative: 0.6 % (ref 0.0–3.0)
Eosinophils Absolute: 0.4 10*3/uL (ref 0.0–0.7)
Eosinophils Relative: 6.5 % — ABNORMAL HIGH (ref 0.0–5.0)
HCT: 35.5 % — ABNORMAL LOW (ref 39.0–52.0)
Hemoglobin: 11.9 g/dL — ABNORMAL LOW (ref 13.0–17.0)
Lymphocytes Relative: 29.2 % (ref 12.0–46.0)
Lymphs Abs: 1.7 10*3/uL (ref 0.7–4.0)
MCHC: 33.5 g/dL (ref 30.0–36.0)
MCV: 93.5 fl (ref 78.0–100.0)
Monocytes Absolute: 0.7 10*3/uL (ref 0.1–1.0)
Monocytes Relative: 12.3 % — ABNORMAL HIGH (ref 3.0–12.0)
Neutro Abs: 3 10*3/uL (ref 1.4–7.7)
Neutrophils Relative %: 51.4 % (ref 43.0–77.0)
Platelets: 134 10*3/uL — ABNORMAL LOW (ref 150.0–400.0)
RBC: 3.8 Mil/uL — ABNORMAL LOW (ref 4.22–5.81)
RDW: 14 % (ref 11.5–15.5)
WBC: 5.9 10*3/uL (ref 4.0–10.5)

## 2020-02-22 NOTE — Progress Notes (Signed)
Henry Adams is a 80 y.o. male with the following history as recorded in EpicCare:  Patient Active Problem List   Diagnosis Date Noted  . Tinea corporis 02/11/2020  . Allergic contact dermatitis due to drugs in contact with skin 02/11/2020  . Acute bronchitis due to COVID-19 virus 02/05/2020  . Traumatic coccydynia 10/05/2019  . Pressure injury of buttock, stage 1 10/05/2019  . Irritable bowel syndrome with both constipation and diarrhea 08/18/2019  . Dysuria 08/18/2019  . Epigastric abdominal tenderness without rebound tenderness 05/21/2019  . Psychophysiological insomnia 04/10/2019  . Benign prostatic hyperplasia with urinary hesitancy 06/25/2018  . Parkinsonism ([place])   . UTI (urinary tract infection) 06/16/2018  . CRI (chronic renal insufficiency), stage 3 (moderate) ([place]) 03/27/2018  . Restless leg syndrome 01/23/2018  . Calculus of gallbladder with chronic cholecystitis without obstruction 09/18/2017  . Chronic idiopathic constipation 08/19/2017  . BPH associated with nocturia 08/11/2015  . Vocal cord atrophy 09/01/2013  . Essential hypertension 04/26/2007  . Allergic rhinitis 04/26/2007  . DEGENERATIVE JOINT DISEASE 04/26/2007  . G E R D 08/05/2006  . Hyperlipidemia with target LDL less than 160 05/30/2006  . Polycystic kidney 05/30/2006    Current Outpatient Medications  Medication Sig Dispense Refill  . acetaminophen (TYLENOL) 500 MG tablet 500 mg prn for back, knee, shoulder or head pain. May be given up to every 6 hours. 90 tablet 3  . alfuzosin (UROXATRAL) 10 MG 24 hr tablet Take 1 tablet (10 mg total) by mouth 2 (two) times daily. 10 mg at 7 am and 7 pm daily.    . Carbidopa-Levodopa ER (SINEMET CR) 25-100 MG tablet controlled release TAKE ONE TABLET AT BEDTIME. 90 tablet 0  . Carbidopa-Levodopa ER (SINEMET CR) 25-100 MG tablet controlled release Take 1 tablet by mouth. Take 1 tab at 6am/10am/2pm/ 1.5 tab at 6pm    . carboxymethylcellulose (REFRESH PLUS) 0.5 % SOLN 1  drop 3 (three) times daily as needed.    . cefUROXime (CEFTIN) 500 MG tablet Take 1 tablet (500 mg total) by mouth 2 (two) times daily with a meal for 5 days. 10 tablet 0  . clonazePAM (KLONOPIN) 0.5 MG tablet Take 1 tablet (0.5 mg total) by mouth at bedtime. 90 tablet 1  . donepezil (ARICEPT) 10 MG tablet Take 1 tablet (10 mg total) by mouth at bedtime. 90 tablet 1  . fexofenadine (ALLEGRA) 180 MG tablet Take 1 tablet (180 mg total) by mouth daily. 171m QD 90 tablet 3  . finasteride (PROSCAR) 5 MG tablet Take 1 tablet (5 mg total) by mouth daily. 532mQD 90 tablet 1  . FLUoxetine (PROZAC) 10 MG capsule Take 1 capsule (10 mg total) by mouth daily. 30 capsule 1  . meclizine (ANTIVERT) 12.5 MG tablet Take 12.5 mg by mouth 3 (three) times daily as needed for dizziness.    . memantine (NAMENDA) 10 MG tablet Take 10 mg by mouth 2 (two) times daily.    . Menthol-Zinc Oxide (CALMOSEPTINE) 0.44-20.6 % OINT Apply 1 Act topically daily. 113 g 3  . omeprazole (PRILOSEC) 40 MG capsule TAKE (1) CAPSULE DAILY. (Patient taking differently: Take 40 mg by mouth daily.) 90 capsule 0  . Polyethylene Glycol 3350 (MIRALAX PO) Take by mouth.    . Probiotic Product ([place] Take 1 capsule by mouth daily. 90 capsule 1  . terbinafine (LAMISIL) 250 MG tablet Take 1 tablet (250 mg total) by mouth daily for 14 days. 14 tablet 0  . vitamin  B-12 (CYANOCOBALAMIN) 1000 MCG tablet Take 1 tablet (1,000 mcg total) by mouth daily. 90 tablet 3   No current facility-administered medications for this visit.    Allergies: Iodine, Oxycodone, Pollen extract, Tamiflu [oseltamivir phosphate], Tamsulosin, Doxazosin, Etodolac, and Tape  Past Medical History:  Diagnosis Date  . Allergy    perennial  . Anxiety   . Arthritis   . Cholelithiasis   . Chronic idiopathic constipation   . CKD (chronic kidney disease)   . Depression   . Diverticulosis   . Enlarged prostate   . Gallstones   . GERD (gastroesophageal  reflux disease)   . HLD (hyperlipidemia)   . Hx of cardiovascular stress test    ETT-Myoview 6/14: probable low risk study, inf defect with normal inf wall motion-cannot r/o ischemia; prominent gut uptake adjacent to inf wall on rest images-poss defect is atten, EF 63%  . Hypertension   . Melanoma ([place])   . Parkinsonism ([place])   . Polycystic kidney disease     Past Surgical History:  Procedure Laterality Date  . COLONOSCOPY  2011   diverticulosis  . HERNIA REPAIR     umbilical  . KNEE SURGERY Left   . medialization laryngoplasty  10/15/2013   Dr Joya Gaskins, [facility]  . TONSILLECTOMY AND ADENOIDECTOMY      Family History  Problem Relation Age of Onset  . Hypertension Father   . Cancer Brother        mouth  . Cancer Sister        breast  . Heart disease Brother        heart transplant  . Cancer Daughter        breast  . Stroke Sister        >34  . Skin cancer Brother        squamous cell  . Colon cancer Neg Hx     Social History   Tobacco Use  . Smoking status: Never Smoker  . Smokeless tobacco: Never Used  Substance Use Topics  . Alcohol use: Not Currently    Alcohol/week: 0.0 standard drinks    Subjective:  Patient was seen at ER on 1/25 with suspected UTI and possible pneumonia; urine culture came back clear today;  Daughter is concerned because patient is continuing to exhibit fatigue/ confusion; she is concerned that he could have had some type of reaction to the Zithromax given at ER; was prescribed 500 mg daily for 4 days- took his last dosage today; Per patient and daughter, he is eating normally; no fever; patient readily admits he has wanted his weight to be down around 168 pounds; Does struggle with chronic constipation- has not had a BM since the end of last week; typically asks for Miralax at his assisted living facility when he needs the medication.     Objective:  Vitals:   02/22/20 1406  BP: 136/84  Pulse: 67  Temp: 97.9 F (36.6 C)  TempSrc: Oral   SpO2: 97%  Weight: 167 lb (75.8 kg)  Height: '5\' 11"'  (1.803 m)    General: Well developed, well nourished, in no acute distress  Skin : Warm and dry.  Head: Normocephalic and atraumatic  Eyes: Sclera and conjunctiva clear; pupils round and reactive to light; extraocular movements intact  Ears: External normal; canals clear; tympanic membranes normal  Oropharynx: Pink, supple. No suspicious lesions  Neck: Supple without thyromegaly, adenopathy  Lungs: Respirations unlabored; clear to auscultation bilaterally without wheeze, rales, rhonchi  CVS exam: normal rate and  regular rhythm.  Neurologic: Alert and oriented; speech intact; face symmetrical;   Assessment:  1. Dysuria   2. Confusion     Plan:  Repeat CXR showed no acute changes today; repeat CBC, CMP, urine culture today; Encouraged to use Miralax today to help with constipation; needs to drink water to help flush his system; discussed possible low grade reaction to the Zithromax- if this is the case, symptoms should start to improve over the course of the week; follow-up to be determined;  Time spent 30 minutes  No follow-ups on file.  Orders Placed This Encounter  Procedures  . Urine Culture    Standing Status:   Future    Standing Expiration Date:   02/21/2021  . CBC with Differential/Platelet    Standing Status:   Future    Standing Expiration Date:   02/21/2021  . Comp Met (CMET)    Standing Status:   Future    Standing Expiration Date:   02/21/2021    Requested Prescriptions    No prescriptions requested or ordered in this encounter

## 2020-02-23 LAB — URINE CULTURE: Result:: NO GROWTH

## 2020-02-24 DIAGNOSIS — R498 Other voice and resonance disorders: Secondary | ICD-10-CM | POA: Diagnosis not present

## 2020-02-26 ENCOUNTER — Encounter: Payer: Self-pay | Admitting: Internal Medicine

## 2020-03-02 DIAGNOSIS — R498 Other voice and resonance disorders: Secondary | ICD-10-CM | POA: Diagnosis not present

## 2020-03-08 DIAGNOSIS — R498 Other voice and resonance disorders: Secondary | ICD-10-CM | POA: Diagnosis not present

## 2020-03-25 ENCOUNTER — Encounter: Payer: Self-pay | Admitting: Internal Medicine

## 2020-03-27 ENCOUNTER — Encounter: Payer: Self-pay | Admitting: Internal Medicine

## 2020-03-28 DIAGNOSIS — R498 Other voice and resonance disorders: Secondary | ICD-10-CM | POA: Diagnosis not present

## 2020-03-28 DIAGNOSIS — G20 Parkinson's disease: Secondary | ICD-10-CM | POA: Diagnosis not present

## 2020-03-28 DIAGNOSIS — M6281 Muscle weakness (generalized): Secondary | ICD-10-CM | POA: Diagnosis not present

## 2020-03-28 DIAGNOSIS — R278 Other lack of coordination: Secondary | ICD-10-CM | POA: Diagnosis not present

## 2020-03-31 DIAGNOSIS — Z1159 Encounter for screening for other viral diseases: Secondary | ICD-10-CM | POA: Diagnosis not present

## 2020-04-04 ENCOUNTER — Telehealth: Payer: Self-pay | Admitting: Internal Medicine

## 2020-04-04 NOTE — Telephone Encounter (Signed)
° °  [facility] requesting FL2 be refaxed to [phone]

## 2020-04-04 NOTE — Telephone Encounter (Signed)
It has been faxed again and I have sent a hard copy via mail.

## 2020-04-06 NOTE — Telephone Encounter (Signed)
I have faxed the FL2 back multiples times or different fax machines here in office. I have received multiple fax confirmations that it has went through. As noted below I have also mailed it via [place]. Unfortunately they will have to wait for hardcopy because it is obviously an electronic issue.

## 2020-04-06 NOTE — Telephone Encounter (Signed)
They are calling back saying they still have not received the FL2 and his previous one expires today so they need it faxed today. Phone# [phone]

## 2020-04-07 DIAGNOSIS — Z1159 Encounter for screening for other viral diseases: Secondary | ICD-10-CM | POA: Diagnosis not present

## 2020-04-11 DIAGNOSIS — Z1159 Encounter for screening for other viral diseases: Secondary | ICD-10-CM | POA: Diagnosis not present

## 2020-04-18 DIAGNOSIS — Z1159 Encounter for screening for other viral diseases: Secondary | ICD-10-CM | POA: Diagnosis not present

## 2020-04-21 DIAGNOSIS — Z1159 Encounter for screening for other viral diseases: Secondary | ICD-10-CM | POA: Diagnosis not present

## 2020-04-25 DIAGNOSIS — Z1159 Encounter for screening for other viral diseases: Secondary | ICD-10-CM | POA: Diagnosis not present

## 2020-04-28 DIAGNOSIS — Z1159 Encounter for screening for other viral diseases: Secondary | ICD-10-CM | POA: Diagnosis not present

## 2020-05-02 DIAGNOSIS — Z1159 Encounter for screening for other viral diseases: Secondary | ICD-10-CM | POA: Diagnosis not present

## 2020-05-05 DIAGNOSIS — Z1159 Encounter for screening for other viral diseases: Secondary | ICD-10-CM | POA: Diagnosis not present

## 2020-05-09 DIAGNOSIS — Z1159 Encounter for screening for other viral diseases: Secondary | ICD-10-CM | POA: Diagnosis not present

## 2020-05-12 DIAGNOSIS — Z1159 Encounter for screening for other viral diseases: Secondary | ICD-10-CM | POA: Diagnosis not present

## 2020-05-16 DIAGNOSIS — Z1159 Encounter for screening for other viral diseases: Secondary | ICD-10-CM | POA: Diagnosis not present

## 2020-05-19 ENCOUNTER — Encounter: Payer: Self-pay | Admitting: Internal Medicine

## 2020-05-19 DIAGNOSIS — Z1159 Encounter for screening for other viral diseases: Secondary | ICD-10-CM | POA: Diagnosis not present

## 2020-05-19 DIAGNOSIS — G20 Parkinson's disease: Secondary | ICD-10-CM

## 2020-05-23 DIAGNOSIS — Z1159 Encounter for screening for other viral diseases: Secondary | ICD-10-CM | POA: Diagnosis not present

## 2020-05-25 ENCOUNTER — Telehealth: Payer: Self-pay | Admitting: Internal Medicine

## 2020-05-25 NOTE — Telephone Encounter (Signed)
I have called [facility] and requested the POC to be faxed again.

## 2020-05-25 NOTE — Telephone Encounter (Signed)
[facility] calling, states they sent over a care plan almost two weeks ago and they haven't got it back signed by the provider.  [phone]

## 2020-05-26 DIAGNOSIS — Z1159 Encounter for screening for other viral diseases: Secondary | ICD-10-CM | POA: Diagnosis not present

## 2020-05-30 DIAGNOSIS — Z1159 Encounter for screening for other viral diseases: Secondary | ICD-10-CM | POA: Diagnosis not present

## 2020-06-02 DIAGNOSIS — Z1159 Encounter for screening for other viral diseases: Secondary | ICD-10-CM | POA: Diagnosis not present

## 2020-06-08 NOTE — Progress Notes (Signed)
Assessment/Plan:   1.  Parkinsonism             -Take carbidopa/levodopa 25/100 CR, 1 tablet at 6 AM/10 AM/2 PM and 1.5 tablets at 6 PM, 1 at bedtime  -pt has previously declined participating with therapies, which is part of the issue with decline.  Discussed in detail today the importance of PT and he was reluctantly agreeable.  Will [facility] to [facility]  -discussed walker at all times and risks of falls without it   -met with my lcsw today   2. Peripheral neuropathy with hx B12 def -on supplement  3. Depressionand anxiety -seeing Dr. Clovis Pu, but has not seen him since October, 2021.  Encouraged him to get a follow-up visit.  4.RLS -Continue clonazepam, 0.5 mg at bedtime.             -Clonazepam now being prescribed by psychiatry  5.Constipation -following with GI -increase hydration  6.Mild cognitive impairment, suspect PDD now -Patient had neurocognitive testing with Dr. Melvyn Novas in August, 2020 with evidence of mild cognitive impairment.  Testing was similar to that in May, 2019.    -on donepezil and memantine (RX Dr. Clovis Pu)  -some delusions now but mild Subjective:   Henry Adams was seen today in follow up for Parkinsons disease.  My previous records were reviewed prior to todays visit as well as outside records available to me. Pt with daughter who supplements hx. medical records reviewed since last visit including MyChart messages from the patient's daughter.  Most recently, she emailed and stated that the patient's mobility was declining.  He declined participating in therapies.  Daughter asked about a wheelchair.  I told him that we could talk about that today, as some insurances require further evaluations, documentation and some with PT evaluations depending on the type of insurance.  Daughter ended up getting a wheelchair prescription from primary care.  Has not seen Dr. Clovis Pu for  quite some time (prior to my last visit with the patient)  Admits to falls but "not in while."  More troublesome with getting up and walking but "walks pretty well with walker usually."  Sometimes, too much trouble shuffling and won't go to dining room because of it.  Got WC order yesterday from pcp.  Mood good but some confusion.  Current prescribed movement disorder medications: carbidopa/levodopa 25/100 CR, 1 tablet at 6 AM/10 AM/2 PM and 1.5 tablets at 6 PM, 1 at bedtime (timing of med changed last visit) carbidopa/levodopa 25/100 CR at bedtime Donepezil 10 mg daily (Dr. Clovis Pu) Memantine (Dr. Clovis Pu) Clonazepam 0.5 mg at bedtime   PREVIOUS MEDICATIONS: carbidopa/levodopa 50/200 at bedtime (patient did not think it helped and thought had more side effects)  Past Psychiatric History from Dr. Cottle:History of Lexapro 20helped awhile but partially effective for anxiety and depression  Trintellix SEafter 4-5 week without benefit. Duloxetine 20 Ritalin 5 BID Clonazepam helped anxiety and RLS and sleep History Wellbutrin XL 300,  trazodone for insomniaand didn't help the RLS. History of Seroquel 25Rx. Never taken. Melatonin dizzy ALLERGIES:   Allergies  Allergen Reactions  . Iodine     rash  . Oxycodone     Mental status changes & nausea  . Pollen Extract Itching  . Tamiflu [Oseltamivir Phosphate] Other (See Comments)    hallucinations  . Tamsulosin     Other reaction(s): Other (See Comments) Unknown   . Doxazosin Rash    agitation  . Etodolac Rash    unknown  .  Tape Rash    Certain "Band-Aids"    CURRENT MEDICATIONS:  Outpatient Encounter Medications as of 06/10/2020  Medication Sig  . acetaminophen (TYLENOL) 500 MG tablet 500 mg prn for back, knee, shoulder or head pain. May be given up to every 6 hours.  [name] alfuzosin (UROXATRAL) 10 MG 24 hr tablet Take 1 tablet (10 mg total) by mouth 2 (two) times daily. 10 mg at 7 am and 7 pm daily.  . Carbidopa-Levodopa ER  (SINEMET CR) 25-100 MG tablet controlled release TAKE ONE TABLET AT BEDTIME.  . Carbidopa-Levodopa ER (SINEMET CR) 25-100 MG tablet controlled release Take 1 tablet by mouth. Take 1 tab at 6am/10am/2pm/ 1.5 tab at 6pm  . carboxymethylcellulose (REFRESH PLUS) 0.5 % SOLN 1 drop 3 (three) times daily as needed.  . cefUROXime (CEFTIN) 500 MG tablet TAKE 1 TABLET BY MOUTH TWICE DAILY WITH MEALS FOR 5 DAYS  . clonazePAM (KLONOPIN) 0.5 MG tablet Take 1 tablet (0.5 mg total) by mouth at bedtime.  . donepezil (ARICEPT) 10 MG tablet Take 1 tablet (10 mg total) by mouth at bedtime.  . fexofenadine (ALLEGRA) 180 MG tablet Take 1 tablet (180 mg total) by mouth daily. 157m QD  . finasteride (PROSCAR) 5 MG tablet Take 1 tablet (5 mg total) by mouth daily. 527mQD  . FLUoxetine (PROZAC) 10 MG capsule Take 1 capsule (10 mg total) by mouth daily.  . meclizine (ANTIVERT) 12.5 MG tablet Take 12.5 mg by mouth 3 (three) times daily as needed for dizziness.  . memantine (NAMENDA) 10 MG tablet Take 10 mg by mouth 2 (two) times daily.  . Menthol-Zinc Oxide (CALMOSEPTINE) 0.44-20.6 % OINT Apply 1 Act topically daily.  . Marland Kitchenmeprazole (PRILOSEC) 40 MG capsule TAKE (1) CAPSULE DAILY. (Patient taking differently: Take 40 mg by mouth daily.)  . Polyethylene Glycol 3350 (MIRALAX PO) Take by mouth.  . Probiotic Product (PHGrove CityCAPS Take 1 capsule by mouth daily.  . vitamin B-12 (CYANOCOBALAMIN) 1000 MCG tablet Take 1 tablet (1,000 mcg total) by mouth daily.  . [DISCONTINUED] azithromycin (ZITHROMAX) 250 MG tablet TAKE 2 TABLETS BY MOUTH DAILY FOR 4 DAYS   No facility-administered encounter medications on file as of 06/10/2020.    Objective:   PHYSICAL EXAMINATION:    VITALS:   Vitals:   06/10/20 1050  BP: 124/82  Pulse: 68  SpO2: 96%  Weight: 165 lb (74.8 kg)  Height: 5' 11" (1.803 m)    GEN:  The patient appears stated age and is in NAD. HEENT:  Normocephalic, atraumatic.  The mucous membranes are  moist. The superficial temporal arteries are without ropiness or tenderness. CV:  RRR Lungs:  CTAB Neck/HEME:  There are no carotid bruits bilaterally.  Neurological examination:  Orientation: The patient is alert and oriented to person/place.  Looks to daughter for finer aspects Cranial nerves: There is good facial symmetry with facial hypomimia. The speech is fluent and hypophonic. Soft palate rises symmetrically and there is no tongue deviation. Hearing is intact to conversational tone. Sensation: Sensation is intact to light touch throughout Motor: Strength is at least antigravity x4.  Movement examination: Tone: There is nl tone in the ue/le Abnormal movements: none Coordination:  There is mild decremation with RAM's Gait and Station: The patient has difficulty arising out of a deep-seated chair without the use of the hands and requires assist from daughter and examiner. The patient's stride length is decreased and he is shuffling.  He has some stutter steps but once in  the hall he does well.    I have reviewed and interpreted the following labs independently    Chemistry      Component Value Date/Time   NA 138 02/22/2020 1448   K 4.1 02/22/2020 1448   CL 105 02/22/2020 1448   CO2 29 02/22/2020 1448   BUN 27 (H) 02/22/2020 1448   CREATININE 1.53 (H) 02/22/2020 1448   CREATININE 1.39 (H) 08/03/2019 1408      Component Value Date/Time   CALCIUM 8.8 02/22/2020 1448   ALKPHOS 71 02/22/2020 1448   AST 16 02/22/2020 1448   ALT 5 02/22/2020 1448   BILITOT 0.5 02/22/2020 1448       Lab Results  Component Value Date   WBC 5.9 02/22/2020   HGB 11.9 (L) 02/22/2020   HCT 35.5 (L) 02/22/2020   MCV 93.5 02/22/2020   PLT 134.0 (L) 02/22/2020    Lab Results  Component Value Date   TSH 1.36 06/25/2018     Total time spent on today's visit was 31 minutes, including both face-to-face time and nonface-to-face time.  Time included that spent on review of records (prior notes  available to me/labs/imaging if pertinent), discussing treatment and goals, answering patient's questions and coordinating care.  Cc:  [name], MD

## 2020-06-09 DIAGNOSIS — N183 Chronic kidney disease, stage 3 unspecified: Secondary | ICD-10-CM | POA: Diagnosis not present

## 2020-06-09 DIAGNOSIS — R339 Retention of urine, unspecified: Secondary | ICD-10-CM | POA: Diagnosis not present

## 2020-06-09 DIAGNOSIS — Z1159 Encounter for screening for other viral diseases: Secondary | ICD-10-CM | POA: Diagnosis not present

## 2020-06-09 DIAGNOSIS — I129 Hypertensive chronic kidney disease with stage 1 through stage 4 chronic kidney disease, or unspecified chronic kidney disease: Secondary | ICD-10-CM | POA: Diagnosis not present

## 2020-06-09 DIAGNOSIS — E785 Hyperlipidemia, unspecified: Secondary | ICD-10-CM | POA: Diagnosis not present

## 2020-06-09 DIAGNOSIS — G20 Parkinson's disease: Secondary | ICD-10-CM | POA: Diagnosis not present

## 2020-06-10 ENCOUNTER — Encounter: Payer: Self-pay | Admitting: Neurology

## 2020-06-10 ENCOUNTER — Other Ambulatory Visit: Payer: Self-pay

## 2020-06-10 ENCOUNTER — Ambulatory Visit: Payer: Medicare Other | Admitting: Neurology

## 2020-06-10 VITALS — BP 124/82 | HR 68 | Ht 71.0 in | Wt 165.0 lb

## 2020-06-10 DIAGNOSIS — G20 Parkinson's disease: Secondary | ICD-10-CM | POA: Diagnosis not present

## 2020-06-10 DIAGNOSIS — F331 Major depressive disorder, recurrent, moderate: Secondary | ICD-10-CM

## 2020-06-10 NOTE — Patient Instructions (Signed)
T-Shirt design contest   SUBMIT UNIQUE DESIGN TO WIN!  Winning design will be featured on T-shirts to support our local Parkinson's patients.   Contest rules and information in on link below .   Contest ends on August 31,2022    How to Enter Submit your own unique quote or a design that will stand out and support the ideas behind Parkinson's Awareness  This contest is open to everyone. The winning design or quote will be selected by a group of unbiased judges. Judges will vote based on quality, relevance, and aesthetic. Judges will not have access to the names associated with the submission. Submissions accepted until: September 21, 2020 What You Win Your own quote or design will be printed on a t-shirt that will support Parkinson's Awareness!! Feel good knowing your quote/artwork will help spread awareness for Parkinson's contribute and 100% of profits to the local area . Tips for Submissions We will accept submissions in the form of quotes, digital artwork, or hand drawn artwork.  Mission/Theme: The Power of One ! Working together to support and treat to improve quality of life for Parkinson's Patients Rules & Regulations All designs and quotes must be original and not subject to copyright of another person or entity. Distribution and Reproduction Rights for all quotes and artwork will be transferred to [facility] upon submission to the contest via the Contest Submission form on September 21, 2020 Online Resources for Power over Parkinson's Group May 2022  . Local [place] Online Groups  o Power over Parkinson's Group :   - Power Over Parkinson's Patient Education Group will be Wednesday, May 11th at 2pm via Zoom.   - Upcoming Power over Parkinson's Meetings:  2nd Wednesdays of the month at 2 pm:  June 8th, July 13th - Contact Amy Marriott at [email or web address] if interested in participating in this online group o Parkinson's Care Partners Group:     3rd Mondays, Contact Misty Paladino o Atypical Parkinsonian Patient Group:   4th Wednesdays, Contact Misty Paladino o If you are interested in participating in these online groups with Misty, please contact her directly for how to join those meetings.  Her contact information is [email or web address].   . Parkinson Foundation:  www.parkinson.org o PD Health at Home continues:  Mindfulness Mondays, Expert Briefing Tuesdays, Wellness Wednesdays, Take Time Thursdays, Fitness Fridays -Listings for May 2022 are on the website o Upcoming Webinar:  Newly Diagnosed Building a Better Life with Parkinson   Wednesday, May 18th @ 1 pm o Register for expert briefings (webinars) at [email or web address] o  Please check out their website to sign up for emails and see their full online offerings  . Michael J Fox Foundation:  www.[email or web address]  o Upcoming Webinar:   What's in your DNA, understanding Parkinson's genetics.  Thursday, May 19th @ 12 noon o Check out additional information on their website to see their full online offerings  . Davis Phinney Foundation:  www.[email or web address].org o Upcoming Webinar:  Stay tuned o Care Partner Monthly Meetup.  With Connie Carpenter Phinney.  First Tuesday of each month, 2 pm o Joy Breaks:  First Wednesday of each month, 2-3 pm. There will be art, doodling, making, crafting, listening, laughing, stories, and everything in between. No art experience necessary. No supplies required. Just show up for joy!  Register on their website. o Check out additional information to Live Well Today on their website  . Parkinson and Movement Disorders (PMD) Alliance:  www.pmdalliance.org o [employer] Online:  Online Education Events   Education Events o Sign up for emails, which are sent weekly to give you updates on programming and online offerings     . Parkinson's Association of the [place]:  www.parkinsonassociation.org o Information on online support groups,  education events, and online exercises including Yoga, Parkinson's exercises and more-LOTS of information on links to PD resources and online events o Virtual Support Group through Parkinson's Association of the [place]; next one is scheduled for Wednesday, May 4th, 2022 at 2 pm. (These are typically scheduled for the 1st Wednesday of the month at 2 pm).  Visit website for details.  . Additional links for movement activities: o PWR! Moves Classes at [place] RESUMED!  Wednesdays 10 and 11 am.  Contact Amy Marriott, PT [email or web address] or [phone] if interested o Here is a link to the PWR!Moves classes on Zoom from [place] - Daily Mon-Sat at 10:00. Via Zoom, FREE and open to all.  There is also a link below via Facebook if you use that platform. - [email or web address].si - https://www.[email or web address].no o Parkinson's Wellness Recovery (PWR! Moves)  www.pwr4life.org - Info on the PWR! Virtual Experience:  You will have access to our expertise through self-assessment, guided plans that start with the PD-specific fundamentals, educational content, tips, Q&A with an expert, and a growing Art therapist of PD-specific pre-recorded and live exercise classes of varying types and intensity - both physical and cognitive! If that is not enough, we offer 1:1 wellness consultations (in-person or virtual) to personalize your PWR! Research scientist (medical).  - Check out the PWR! Move of the month on the [place] [facility] website:  [email or web address] o [employer] Fridays:  - As part of the PD Health @ Home program, this free video series focuses each week on one aspect of fitness designed to support people living with Parkinson's.  These weekly  videos highlight the [place] recent fitness guidelines for people with Parkinson's disease. -  HollywoodSale.dk o Dance for PD website is offering free, live-stream classes throughout the week, as well as links to [employer] of classes:  [email or web address]://[email or web address]/ o Dance for Parkinson's Class:  [place].  Free offering for people with Parkinson's and care partners; virtual class.  o For more information, contact [phone] or email Ruffin Frederick at [email or web address] o Virtual dance and Pilates for Parkinson's classes: Click on the Community Tab> Parkinson's Movement Initiative Tab.  To register for classes and for more information, visit www.[email or web address].co.za and click the "community" tab.     o [employer] Parkinson's Cycling Classes  - [facility]: 1pm on Fridays-Live classes at [employer] ([employer] at [email or web address] or [phone]) [facility] [employer]: Virtual Classes Mondays and Thursdays Jeanette Caprice classes Tuesday, Wednesday and Thursday (contact [place] at [email or web address]  or [phone])  o [place] levels of classes are offered Tuesdays and Thursdays:  10:30 am,  12 noon & 1:45 pm at [facility].  - Active Stretching with Paula Compton Class starting in March, on Fridays - To observe a class or for  more information, call [phone] or email [email or web address] . Well[employer]Spring [employer]: o Chief Technology Officer Opportunities:  www.well-springsolutions.org/caregiver-education/caregiver-support-group.  You may also contact Vickki Muff at [email or web address] or [phone].   o [facility] for Family Caregivers! Thursday, May 12th 10:00a-1:30p [place] 02-18-2000, [employer], [place], [place] You may contact [address] at  [email or web address] or [phone].   o Well-Spring Navigator:  Just1Navigator program, a free service to help individuals  through the journey of determining care for older adults.  The "Navigator" is a social worker, Nicole Reynolds, who will speak with a prospective client and/or loved ones to provide an assessment of the situation and a set of recommendations for a personalized care plan -- all free of charge, and whether Well[employer]Spring [employer] offers the needed service or not. If the need is not a service we provide, we are well-connected with reputable programs in town that we can refer you to.  www.well-springsolutions.org or to speak with the Navigator, call [phone].     

## 2020-06-13 ENCOUNTER — Telehealth: Payer: Self-pay | Admitting: Internal Medicine

## 2020-06-13 DIAGNOSIS — Z1159 Encounter for screening for other viral diseases: Secondary | ICD-10-CM | POA: Diagnosis not present

## 2020-06-13 NOTE — Progress Notes (Signed)
  Chronic Care Management   Outreach Note  06/13/2020 Name: Henry Adams MRN: [id number] DOB: [birth year]  Referred by: [name], MD Reason for referral : No chief complaint on file.   An unsuccessful telephone outreach was attempted today. The patient was referred to the pharmacist for assistance with care management and care coordination.   Follow Up Plan:   [place]

## 2020-06-16 DIAGNOSIS — Z1159 Encounter for screening for other viral diseases: Secondary | ICD-10-CM | POA: Diagnosis not present

## 2020-06-17 DIAGNOSIS — R2681 Unsteadiness on feet: Secondary | ICD-10-CM | POA: Diagnosis not present

## 2020-06-17 DIAGNOSIS — M6281 Muscle weakness (generalized): Secondary | ICD-10-CM | POA: Diagnosis not present

## 2020-06-17 DIAGNOSIS — G20 Parkinson's disease: Secondary | ICD-10-CM | POA: Diagnosis not present

## 2020-06-17 DIAGNOSIS — R2689 Other abnormalities of gait and mobility: Secondary | ICD-10-CM | POA: Diagnosis not present

## 2020-06-20 ENCOUNTER — Encounter: Payer: Self-pay | Admitting: Internal Medicine

## 2020-06-20 DIAGNOSIS — Z1159 Encounter for screening for other viral diseases: Secondary | ICD-10-CM | POA: Diagnosis not present

## 2020-06-21 DIAGNOSIS — G20 Parkinson's disease: Secondary | ICD-10-CM | POA: Diagnosis not present

## 2020-06-21 DIAGNOSIS — M6281 Muscle weakness (generalized): Secondary | ICD-10-CM | POA: Diagnosis not present

## 2020-06-21 DIAGNOSIS — R2681 Unsteadiness on feet: Secondary | ICD-10-CM | POA: Diagnosis not present

## 2020-06-21 DIAGNOSIS — R2689 Other abnormalities of gait and mobility: Secondary | ICD-10-CM | POA: Diagnosis not present

## 2020-06-23 ENCOUNTER — Telehealth: Payer: Self-pay | Admitting: Internal Medicine

## 2020-06-23 DIAGNOSIS — R2681 Unsteadiness on feet: Secondary | ICD-10-CM | POA: Diagnosis not present

## 2020-06-23 DIAGNOSIS — G20 Parkinson's disease: Secondary | ICD-10-CM | POA: Diagnosis not present

## 2020-06-23 DIAGNOSIS — Z1159 Encounter for screening for other viral diseases: Secondary | ICD-10-CM | POA: Diagnosis not present

## 2020-06-23 DIAGNOSIS — R2689 Other abnormalities of gait and mobility: Secondary | ICD-10-CM | POA: Diagnosis not present

## 2020-06-23 DIAGNOSIS — M6281 Muscle weakness (generalized): Secondary | ICD-10-CM | POA: Diagnosis not present

## 2020-06-23 NOTE — Telephone Encounter (Signed)
   Daughter calling for status of form for [facility], order for Tylenol  Please call

## 2020-06-23 NOTE — Progress Notes (Signed)
  Chronic Care Management   Note  06/23/2020 Name: Henry Adams MRN: [id number] DOB: 1940-10-16  Henry Adams is a 80 y.o. year old male who is a primary care patient of [name], MD. I reached out to Seward Speck by phone today in response to a referral sent by Henry Adams PCP, [name], MD.   Henry Adams was given information about Chronic Care Management services today including:  1. CCM service includes personalized support from designated clinical staff supervised by his physician, including individualized plan of care and coordination with other care providers 2. 24/7 contact phone numbers for assistance for urgent and routine care needs. 3. Service will only be billed when office clinical staff spend 20 minutes or more in a month to coordinate care. 4. Only one practitioner may furnish and bill the service in a calendar month. 5. The patient may stop CCM services at any time (effective at the end of the month) by phone call to the office staff.   Patient did not agree to enrollment in care management services and does not wish to consider at this time.  Follow up plan:   Lauretta Grill Upstream Scheduler

## 2020-06-27 DIAGNOSIS — Z1159 Encounter for screening for other viral diseases: Secondary | ICD-10-CM | POA: Diagnosis not present

## 2020-06-28 DIAGNOSIS — R2681 Unsteadiness on feet: Secondary | ICD-10-CM | POA: Diagnosis not present

## 2020-06-28 DIAGNOSIS — R2689 Other abnormalities of gait and mobility: Secondary | ICD-10-CM | POA: Diagnosis not present

## 2020-06-28 DIAGNOSIS — G20 Parkinson's disease: Secondary | ICD-10-CM | POA: Diagnosis not present

## 2020-06-28 DIAGNOSIS — M6281 Muscle weakness (generalized): Secondary | ICD-10-CM | POA: Diagnosis not present

## 2020-06-29 DIAGNOSIS — G20 Parkinson's disease: Secondary | ICD-10-CM | POA: Diagnosis not present

## 2020-06-29 DIAGNOSIS — M6281 Muscle weakness (generalized): Secondary | ICD-10-CM | POA: Diagnosis not present

## 2020-06-29 DIAGNOSIS — R2681 Unsteadiness on feet: Secondary | ICD-10-CM | POA: Diagnosis not present

## 2020-06-29 DIAGNOSIS — R2689 Other abnormalities of gait and mobility: Secondary | ICD-10-CM | POA: Diagnosis not present

## 2020-06-30 DIAGNOSIS — Z1159 Encounter for screening for other viral diseases: Secondary | ICD-10-CM | POA: Diagnosis not present

## 2020-07-01 DIAGNOSIS — M6281 Muscle weakness (generalized): Secondary | ICD-10-CM | POA: Diagnosis not present

## 2020-07-01 DIAGNOSIS — R2681 Unsteadiness on feet: Secondary | ICD-10-CM | POA: Diagnosis not present

## 2020-07-01 DIAGNOSIS — G20 Parkinson's disease: Secondary | ICD-10-CM | POA: Diagnosis not present

## 2020-07-01 DIAGNOSIS — R2689 Other abnormalities of gait and mobility: Secondary | ICD-10-CM | POA: Diagnosis not present

## 2020-07-04 DIAGNOSIS — Z1159 Encounter for screening for other viral diseases: Secondary | ICD-10-CM | POA: Diagnosis not present

## 2020-07-04 DIAGNOSIS — M6281 Muscle weakness (generalized): Secondary | ICD-10-CM | POA: Diagnosis not present

## 2020-07-04 DIAGNOSIS — R2689 Other abnormalities of gait and mobility: Secondary | ICD-10-CM | POA: Diagnosis not present

## 2020-07-04 DIAGNOSIS — G20 Parkinson's disease: Secondary | ICD-10-CM | POA: Diagnosis not present

## 2020-07-04 DIAGNOSIS — R2681 Unsteadiness on feet: Secondary | ICD-10-CM | POA: Diagnosis not present

## 2020-07-06 DIAGNOSIS — G20 Parkinson's disease: Secondary | ICD-10-CM | POA: Diagnosis not present

## 2020-07-06 DIAGNOSIS — R2681 Unsteadiness on feet: Secondary | ICD-10-CM | POA: Diagnosis not present

## 2020-07-06 DIAGNOSIS — M6281 Muscle weakness (generalized): Secondary | ICD-10-CM | POA: Diagnosis not present

## 2020-07-06 DIAGNOSIS — R2689 Other abnormalities of gait and mobility: Secondary | ICD-10-CM | POA: Diagnosis not present

## 2020-07-08 DIAGNOSIS — R2689 Other abnormalities of gait and mobility: Secondary | ICD-10-CM | POA: Diagnosis not present

## 2020-07-08 DIAGNOSIS — G20 Parkinson's disease: Secondary | ICD-10-CM | POA: Diagnosis not present

## 2020-07-08 DIAGNOSIS — R2681 Unsteadiness on feet: Secondary | ICD-10-CM | POA: Diagnosis not present

## 2020-07-08 DIAGNOSIS — M6281 Muscle weakness (generalized): Secondary | ICD-10-CM | POA: Diagnosis not present

## 2020-07-11 DIAGNOSIS — G20 Parkinson's disease: Secondary | ICD-10-CM | POA: Diagnosis not present

## 2020-07-11 DIAGNOSIS — R2681 Unsteadiness on feet: Secondary | ICD-10-CM | POA: Diagnosis not present

## 2020-07-11 DIAGNOSIS — M6281 Muscle weakness (generalized): Secondary | ICD-10-CM | POA: Diagnosis not present

## 2020-07-11 DIAGNOSIS — R2689 Other abnormalities of gait and mobility: Secondary | ICD-10-CM | POA: Diagnosis not present

## 2020-07-13 DIAGNOSIS — M6281 Muscle weakness (generalized): Secondary | ICD-10-CM | POA: Diagnosis not present

## 2020-07-13 DIAGNOSIS — R2681 Unsteadiness on feet: Secondary | ICD-10-CM | POA: Diagnosis not present

## 2020-07-13 DIAGNOSIS — G20 Parkinson's disease: Secondary | ICD-10-CM | POA: Diagnosis not present

## 2020-07-13 DIAGNOSIS — R2689 Other abnormalities of gait and mobility: Secondary | ICD-10-CM | POA: Diagnosis not present

## 2020-07-14 DIAGNOSIS — Z1159 Encounter for screening for other viral diseases: Secondary | ICD-10-CM | POA: Diagnosis not present

## 2020-07-15 DIAGNOSIS — G20 Parkinson's disease: Secondary | ICD-10-CM | POA: Diagnosis not present

## 2020-07-15 DIAGNOSIS — R2689 Other abnormalities of gait and mobility: Secondary | ICD-10-CM | POA: Diagnosis not present

## 2020-07-15 DIAGNOSIS — R2681 Unsteadiness on feet: Secondary | ICD-10-CM | POA: Diagnosis not present

## 2020-07-15 DIAGNOSIS — M6281 Muscle weakness (generalized): Secondary | ICD-10-CM | POA: Diagnosis not present

## 2020-07-18 DIAGNOSIS — G20 Parkinson's disease: Secondary | ICD-10-CM | POA: Diagnosis not present

## 2020-07-18 DIAGNOSIS — R2681 Unsteadiness on feet: Secondary | ICD-10-CM | POA: Diagnosis not present

## 2020-07-18 DIAGNOSIS — M6281 Muscle weakness (generalized): Secondary | ICD-10-CM | POA: Diagnosis not present

## 2020-07-18 DIAGNOSIS — Z1159 Encounter for screening for other viral diseases: Secondary | ICD-10-CM | POA: Diagnosis not present

## 2020-07-18 DIAGNOSIS — R2689 Other abnormalities of gait and mobility: Secondary | ICD-10-CM | POA: Diagnosis not present

## 2020-07-20 DIAGNOSIS — R2681 Unsteadiness on feet: Secondary | ICD-10-CM | POA: Diagnosis not present

## 2020-07-20 DIAGNOSIS — R2689 Other abnormalities of gait and mobility: Secondary | ICD-10-CM | POA: Diagnosis not present

## 2020-07-20 DIAGNOSIS — M6281 Muscle weakness (generalized): Secondary | ICD-10-CM | POA: Diagnosis not present

## 2020-07-20 DIAGNOSIS — G20 Parkinson's disease: Secondary | ICD-10-CM | POA: Diagnosis not present

## 2020-07-22 DIAGNOSIS — G20 Parkinson's disease: Secondary | ICD-10-CM | POA: Diagnosis not present

## 2020-07-22 DIAGNOSIS — M6281 Muscle weakness (generalized): Secondary | ICD-10-CM | POA: Diagnosis not present

## 2020-07-22 DIAGNOSIS — R2681 Unsteadiness on feet: Secondary | ICD-10-CM | POA: Diagnosis not present

## 2020-07-22 DIAGNOSIS — R2689 Other abnormalities of gait and mobility: Secondary | ICD-10-CM | POA: Diagnosis not present

## 2020-07-25 DIAGNOSIS — Z1159 Encounter for screening for other viral diseases: Secondary | ICD-10-CM | POA: Diagnosis not present

## 2020-07-26 IMAGING — CT CT HEAD WITHOUT CONTRAST
3 series · 15 of 47 positions shown, 18 images · non-contrast
Comparison: Head CT August 17, 2017 and brain MRI December 11, 2017;
earlier brain MRI April 25, 2015

CLINICAL DATA: Fever and altered mental status

EXAM:
CT HEAD WITHOUT CONTRAST
TECHNIQUE: Contiguous axial images were obtained from the base of the skull
through the vertex without intravenous contrast.

[Series 2: head wo · axial · 0.49mm/px · z∈[+1416,+1541]mm · 9 of 31 slices shown, 12 images]
[im 3/31  brain]
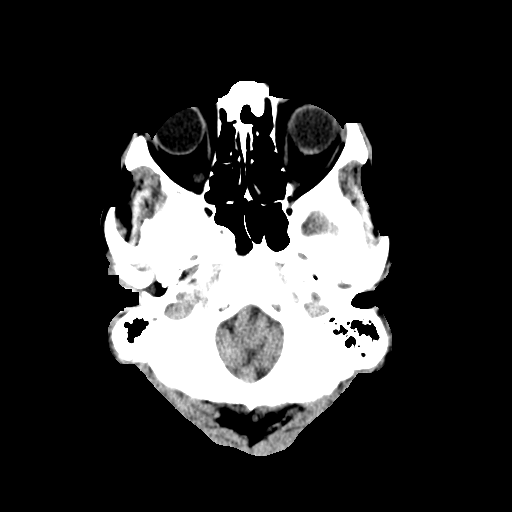
[im 3/31  bone]
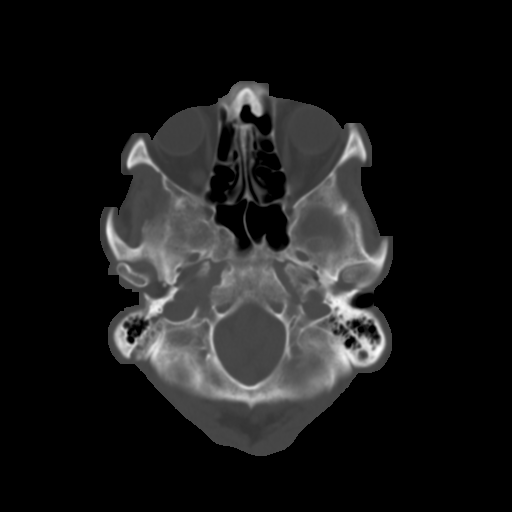
[im 6/31  brain]
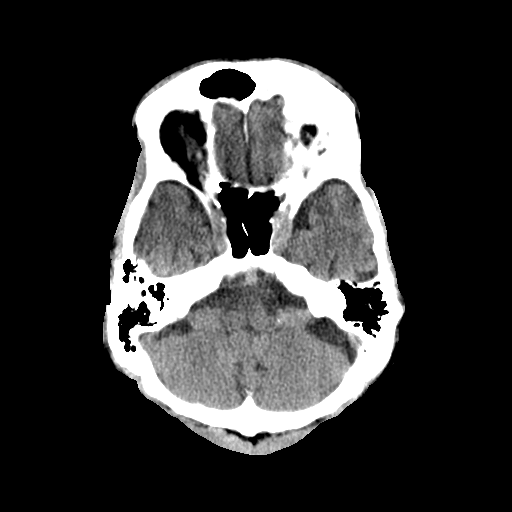
[im 9/31  brain]
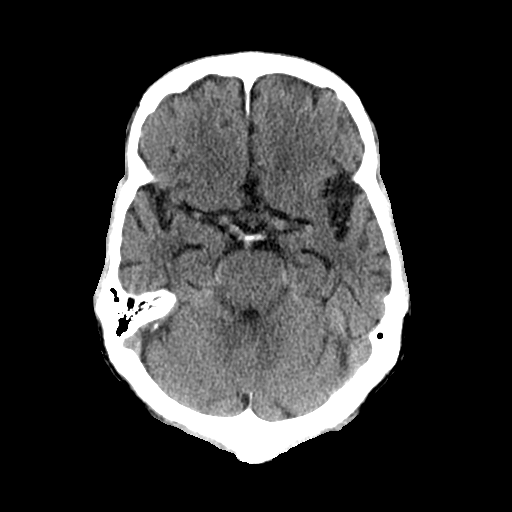
[im 12/31  brain]
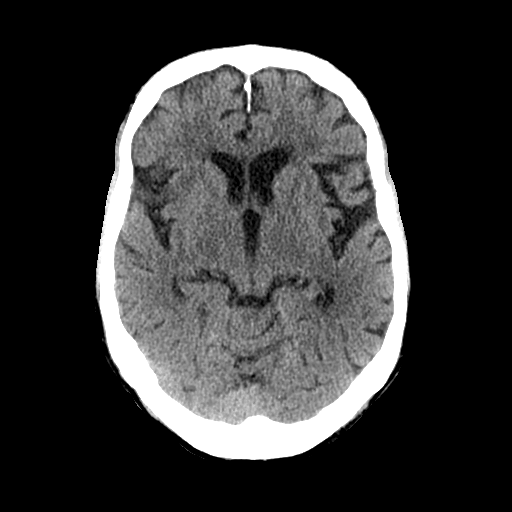
[im 16/31  brain]
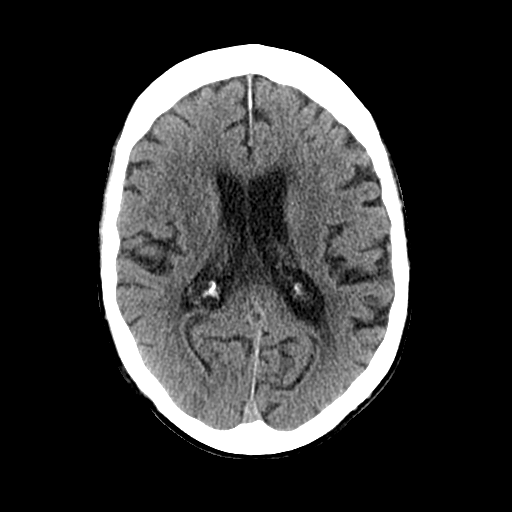
[im 16/31  bone]
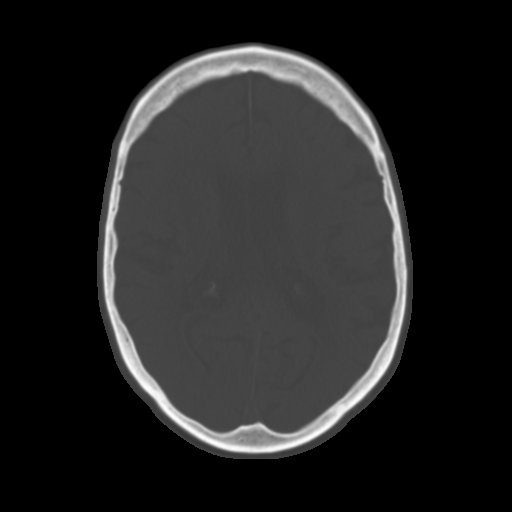
[im 19/31  brain]
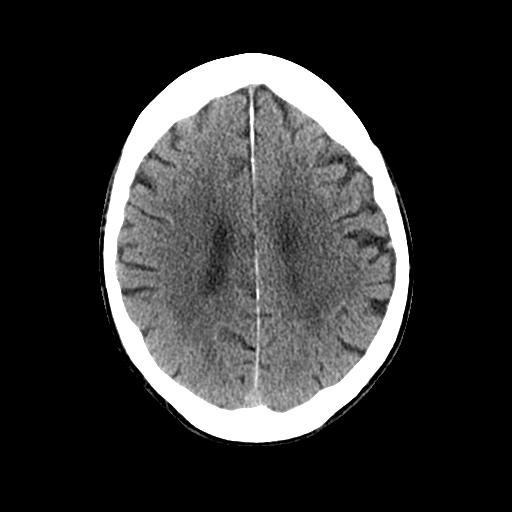
[im 22/31  brain]
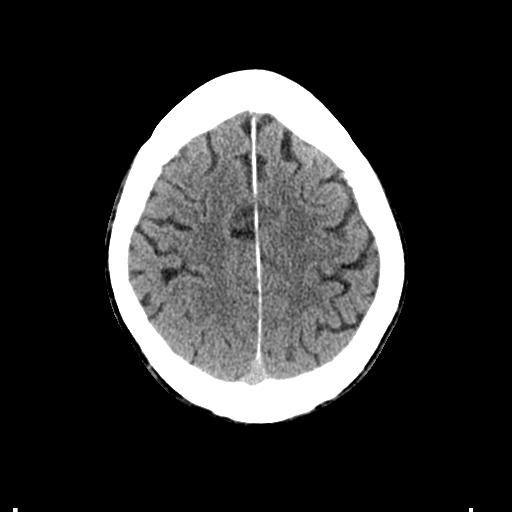
[im 25/31  brain]
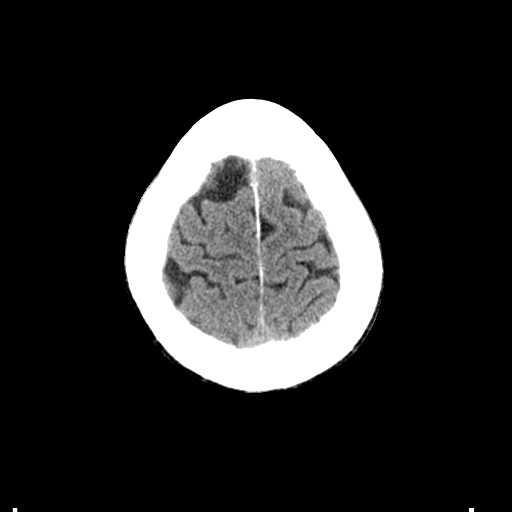
[im 28/31  brain]
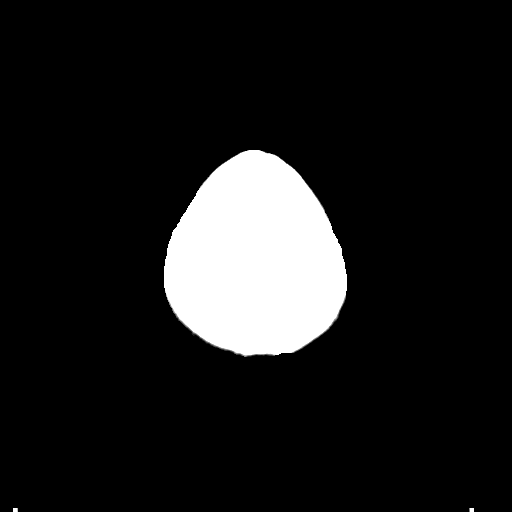
[im 28/31  bone]
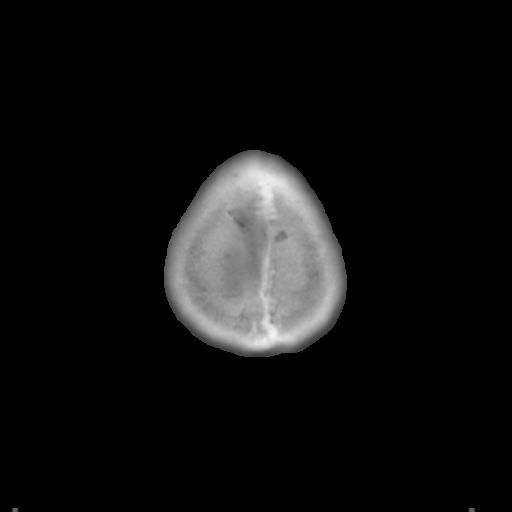

[Series 5: coronal soft tissue · coronal · 0.30mm/px · 3 of 70 slices shown]
[im 24/70  brain]
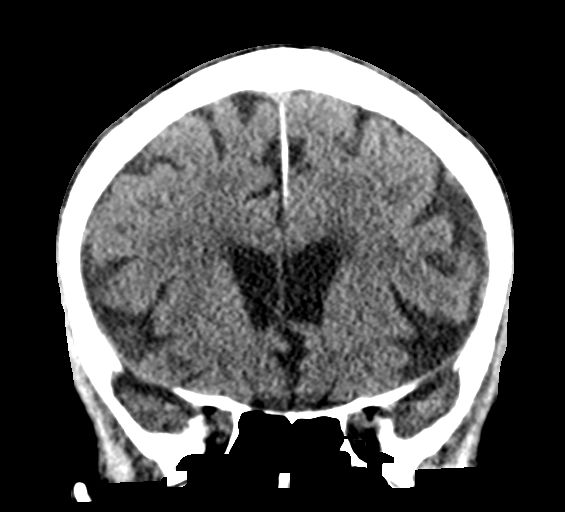
[im 31/70  brain]
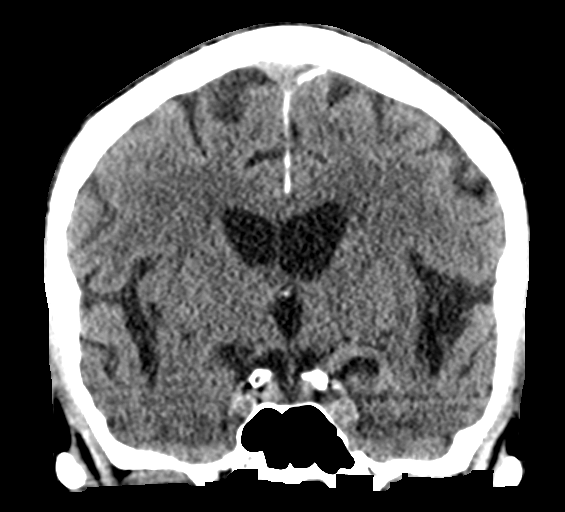
[im 39/70  brain]
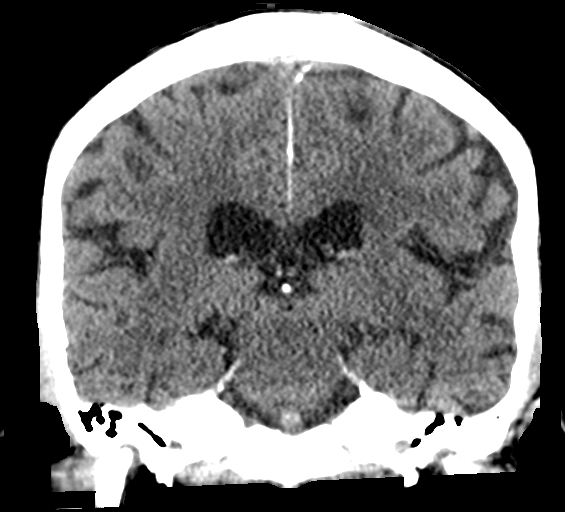

[Series 6: sagittal soft tissue · sagittal · 0.30mm/px · 3 of 57 slices shown]
[im 19/57  brain]
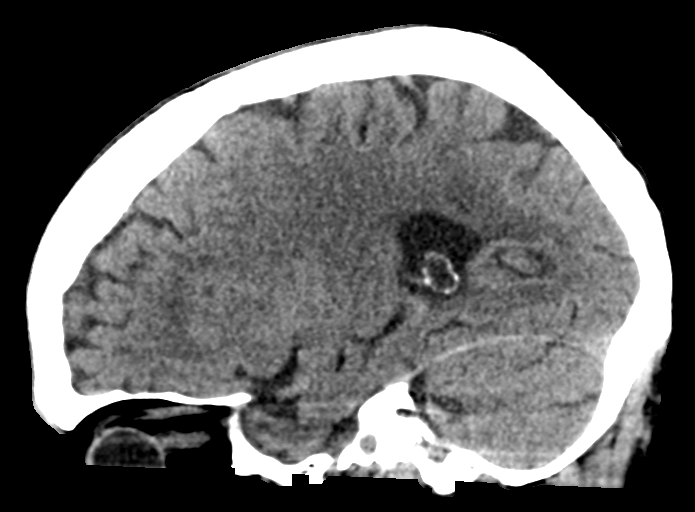
[im 29/57  brain]
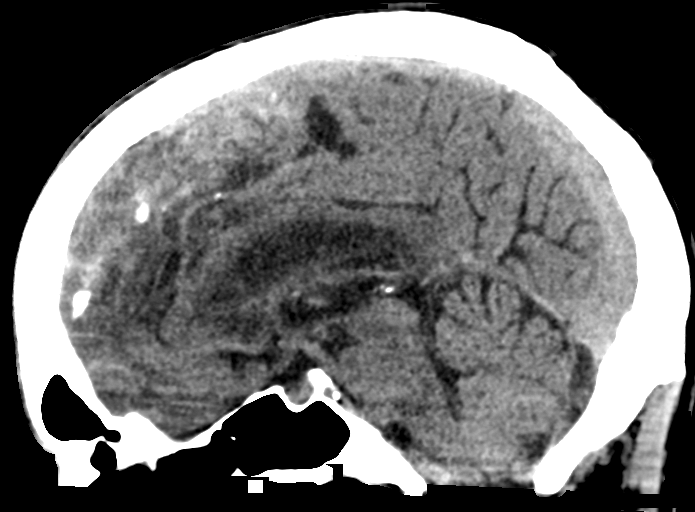
[im 38/57  brain]
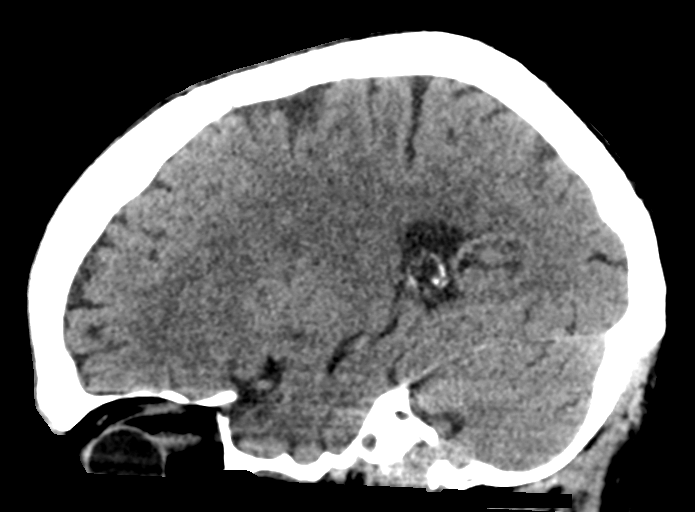

[15 of 47 positions shown; findings below may reference images not displayed]

FINDINGS: Brain: Mild diffuse atrophy is stable. There is a stable meningioma
at the posterior foramen magnum level measuring 8 x 8 mm. This small
meningioma does not show evident mass effect or edema. No other mass
is evident. There is no hemorrhage, extra-axial fluid collection, or
midline shift. There is patchy small vessel disease in the centra
semiovale bilaterally. There is no evident acute infarct.

Vascular: No hyperdense vessel. There is calcification in each
carotid siphon region.

Skull: The bony calvarium appears intact.

Sinuses/Orbits: There is mucosal thickening in several ethmoid air
cells. Other visualized paranasal sinuses are clear. Visualized
orbits appear symmetric bilaterally.

Other: Mastoid air cells are clear.
IMPRESSION: Stable atrophy with periventricular small vessel disease. No evident
acute infarct. Stable subcentimeter meningioma in the posterior
foramen magnum region without mass effect or edema. No new mass
evident. No hemorrhage.

There are foci of arterial vascular calcification. There is mucosal
thickening in several ethmoid air cells.

## 2020-07-26 IMAGING — CT CT RENAL STONE PROTOCOL
2 of 4 series · 15 of 46 positions shown, 17 images · non-contrast
Comparison: CT dated 07/01/2006.

CLINICAL DATA: Fever.  Confusion.

EXAM:
CT ABDOMEN AND PELVIS WITHOUT CONTRAST
TECHNIQUE: Multidetector CT imaging of the abdomen and pelvis was performed
following the standard protocol without IV contrast.

[Series 2: axial st · axial · 0.72mm/px · z∈[-712,-242]mm · 12 of 106 slices shown, 14 images]
[im 6/106  soft-tissue]
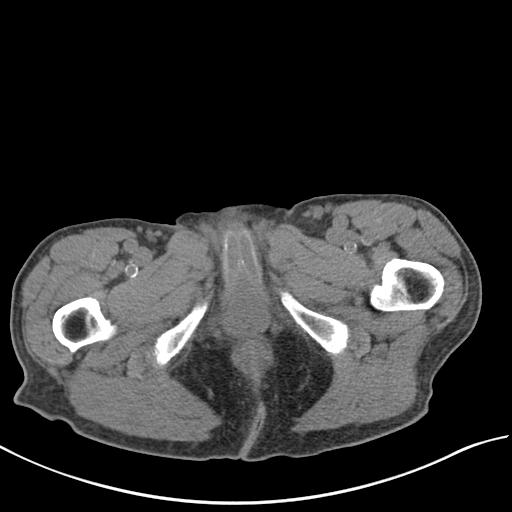
[im 6/106  bone]
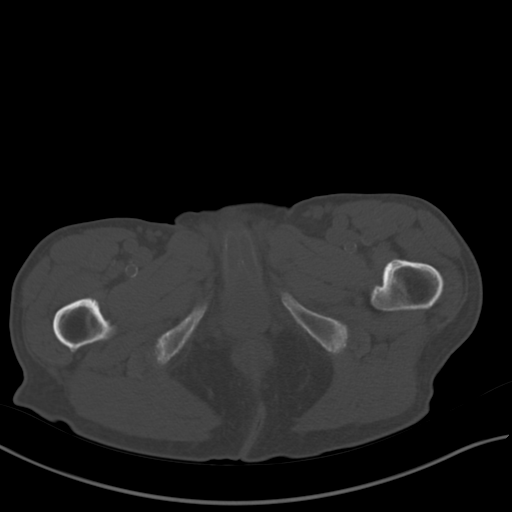
[im 17/106  soft-tissue]
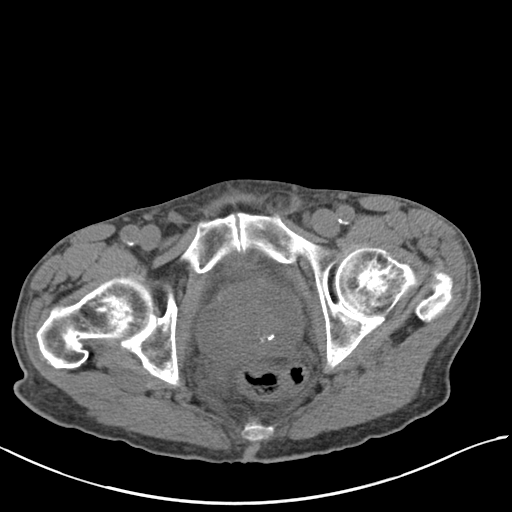
[im 23/106  soft-tissue]
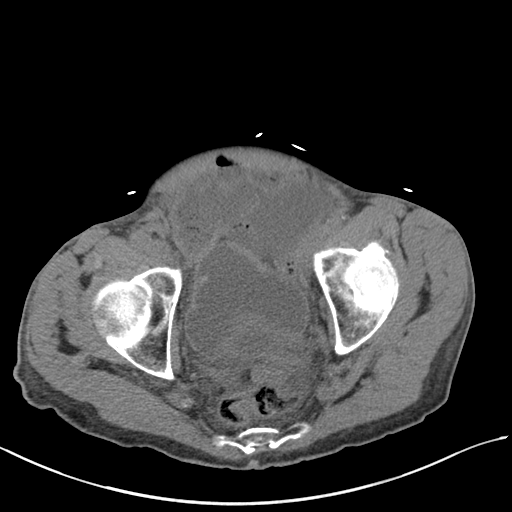
[im 34/106  soft-tissue]
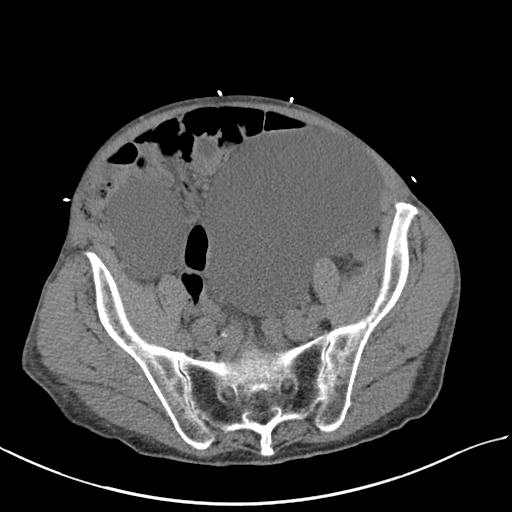
[im 39/106  soft-tissue]
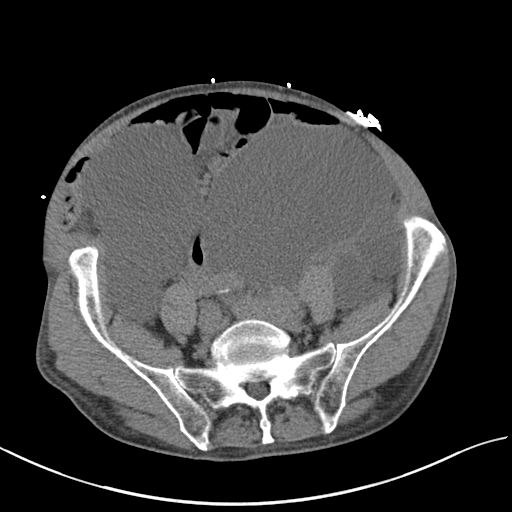
[im 50/106  soft-tissue]
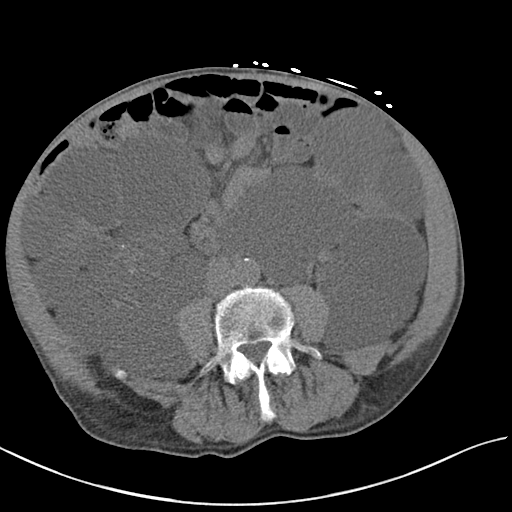
[im 56/106  soft-tissue]
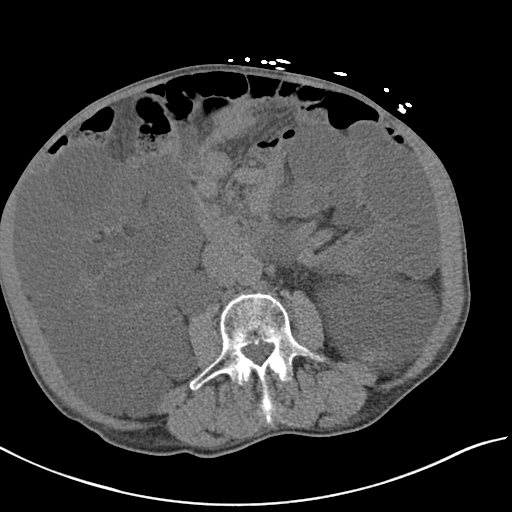
[im 67/106  soft-tissue]
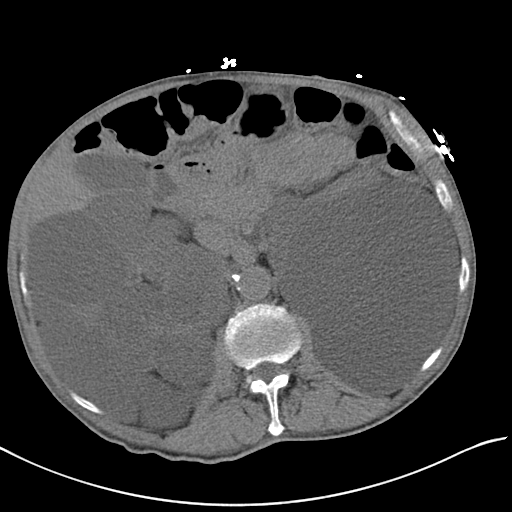
[im 72/106  soft-tissue]
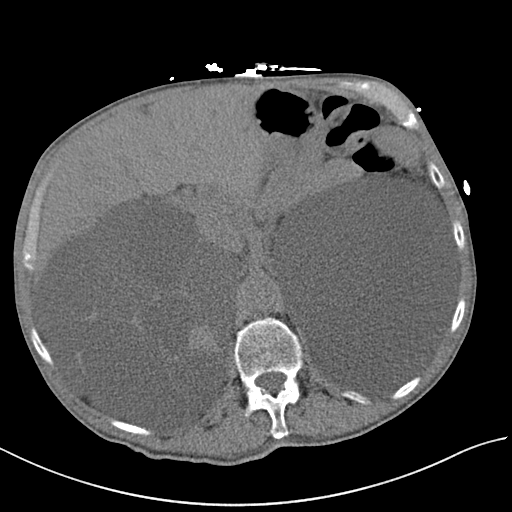
[im 72/106  bone]
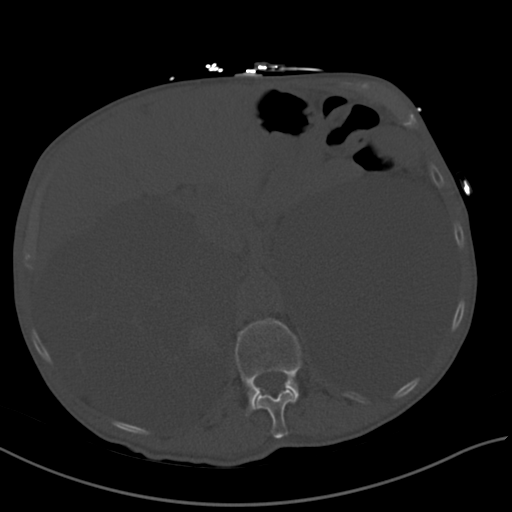
[im 83/106  soft-tissue]
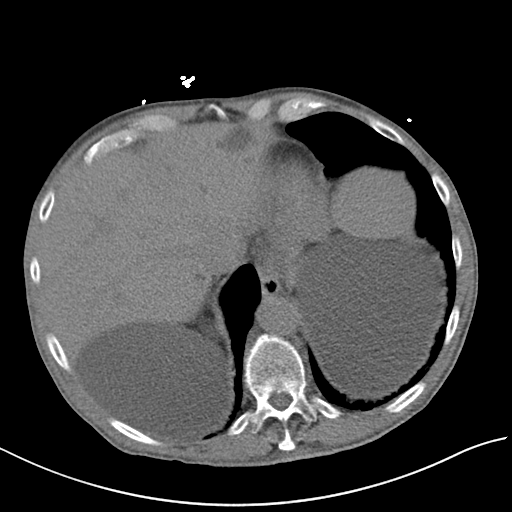
[im 89/106  soft-tissue]
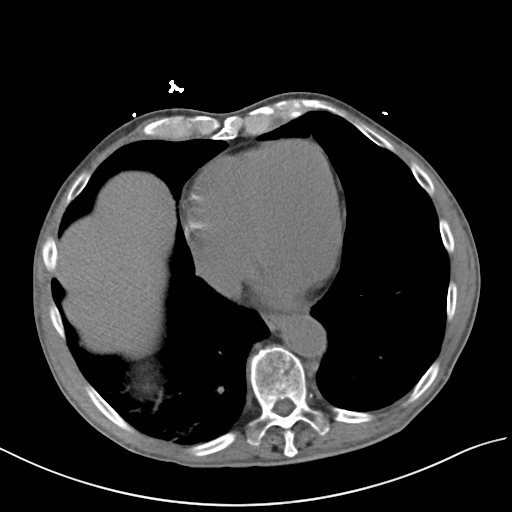
[im 100/106  soft-tissue]
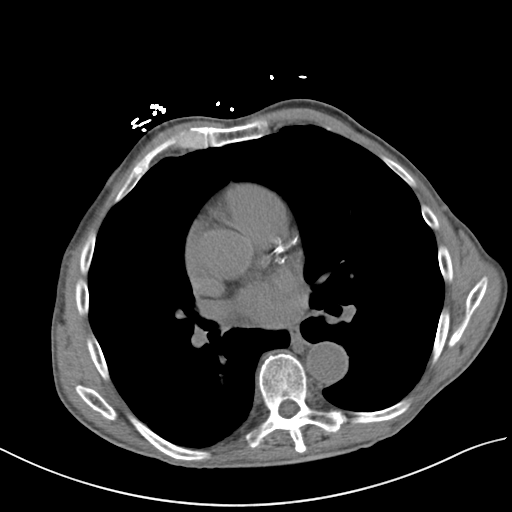

[Series 5: coronal · coronal · 0.68mm/px · 3 of 146 slices shown]
[im 49/146  soft-tissue]
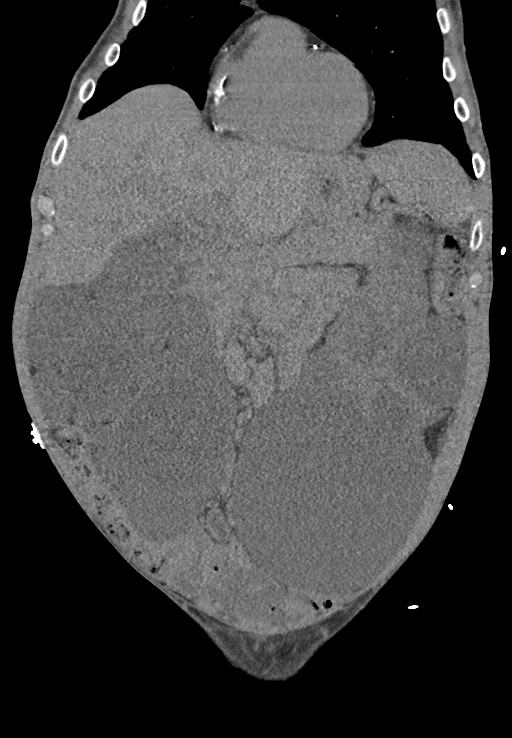
[im 65/146  soft-tissue]
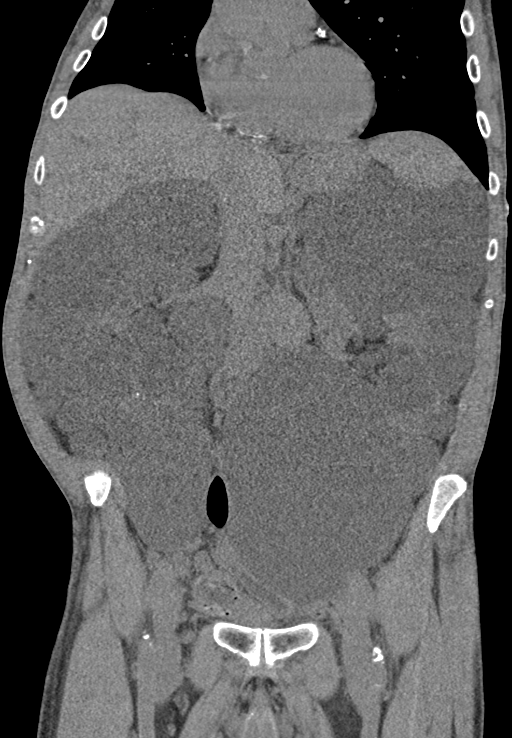
[im 81/146  soft-tissue]
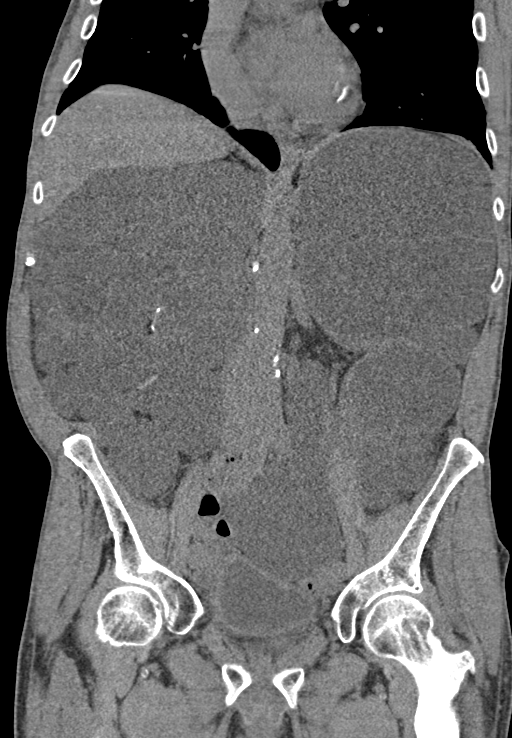

[15 of 46 positions shown; findings below may reference images not displayed]

FINDINGS: Lower chest: No acute abnormality.

Hepatobiliary: Innumerable cysts are noted in the liver. The
gallbladder is unremarkable.

Pancreas: The pancreas is suboptimally evaluated in the absence of
IV contrast. There is no definite pancreatic mass.

Spleen: Normal in size without focal abnormality.

Adrenals/Urinary Tract: There are innumerable large cysts throughout
both kidneys. Both kidneys are enlarged. There is some fullness of
both collecting systems without evidence of an obstructing stone.
The bladder is decompressed which limits evaluation.

Stomach/Bowel: The bowel is suboptimally evaluated secondary to lack
of both oral and IV contrast. Additionally much of the bowel is
compressed secondary to the patient's enlarged kidneys. Given these
limitations, no definite abnormality is detected. There is no small
bowel obstruction. The appendix is not well identified.

Vascular/Lymphatic: Aortic atherosclerosis. No enlarged abdominal or
pelvic lymph nodes.

Reproductive: Prostate gland is significantly enlarged. There is a
trace amount of free fluid in the patient's pelvis.

Other: No abdominal wall hernia or abnormality. No abdominopelvic
ascites.

Musculoskeletal: No acute or significant osseous findings.
IMPRESSION: 1. Limited study as detailed above.
2. No definite acute abnormality detected.
3. Again identified are findings of polycystic kidney disease.

## 2020-07-27 DIAGNOSIS — M6281 Muscle weakness (generalized): Secondary | ICD-10-CM | POA: Diagnosis not present

## 2020-07-27 DIAGNOSIS — R2681 Unsteadiness on feet: Secondary | ICD-10-CM | POA: Diagnosis not present

## 2020-07-27 DIAGNOSIS — G20 Parkinson's disease: Secondary | ICD-10-CM | POA: Diagnosis not present

## 2020-07-27 DIAGNOSIS — R2689 Other abnormalities of gait and mobility: Secondary | ICD-10-CM | POA: Diagnosis not present

## 2020-07-28 DIAGNOSIS — Z1159 Encounter for screening for other viral diseases: Secondary | ICD-10-CM | POA: Diagnosis not present

## 2020-07-29 DIAGNOSIS — M6281 Muscle weakness (generalized): Secondary | ICD-10-CM | POA: Diagnosis not present

## 2020-07-29 DIAGNOSIS — R2689 Other abnormalities of gait and mobility: Secondary | ICD-10-CM | POA: Diagnosis not present

## 2020-07-29 DIAGNOSIS — R2681 Unsteadiness on feet: Secondary | ICD-10-CM | POA: Diagnosis not present

## 2020-07-29 DIAGNOSIS — G20 Parkinson's disease: Secondary | ICD-10-CM | POA: Diagnosis not present

## 2020-08-01 DIAGNOSIS — G20 Parkinson's disease: Secondary | ICD-10-CM | POA: Diagnosis not present

## 2020-08-01 DIAGNOSIS — R2689 Other abnormalities of gait and mobility: Secondary | ICD-10-CM | POA: Diagnosis not present

## 2020-08-01 DIAGNOSIS — Z1159 Encounter for screening for other viral diseases: Secondary | ICD-10-CM | POA: Diagnosis not present

## 2020-08-01 DIAGNOSIS — M6281 Muscle weakness (generalized): Secondary | ICD-10-CM | POA: Diagnosis not present

## 2020-08-01 DIAGNOSIS — R2681 Unsteadiness on feet: Secondary | ICD-10-CM | POA: Diagnosis not present

## 2020-08-03 ENCOUNTER — Telehealth: Payer: Self-pay | Admitting: Internal Medicine

## 2020-08-03 DIAGNOSIS — G20 Parkinson's disease: Secondary | ICD-10-CM | POA: Diagnosis not present

## 2020-08-03 DIAGNOSIS — R2689 Other abnormalities of gait and mobility: Secondary | ICD-10-CM | POA: Diagnosis not present

## 2020-08-03 DIAGNOSIS — R2681 Unsteadiness on feet: Secondary | ICD-10-CM | POA: Diagnosis not present

## 2020-08-03 DIAGNOSIS — M6281 Muscle weakness (generalized): Secondary | ICD-10-CM | POA: Diagnosis not present

## 2020-08-03 NOTE — Telephone Encounter (Signed)
I've reviewed OV notes prior to 5/6 and do not see that a wheelchair need was addressed. Would you like for pt to have a OV to discuss? Please advise.

## 2020-08-03 NOTE — Telephone Encounter (Signed)
   Henry Adams w/ [facility] is requesting face to face notes before 5/6 stating that the patient needs the wheelchair or an updated order.   Phone: [phone]; ext: [phone] Fax: [phone]

## 2020-08-04 DIAGNOSIS — Z1159 Encounter for screening for other viral diseases: Secondary | ICD-10-CM | POA: Diagnosis not present

## 2020-08-08 DIAGNOSIS — R2681 Unsteadiness on feet: Secondary | ICD-10-CM | POA: Diagnosis not present

## 2020-08-08 DIAGNOSIS — G20 Parkinson's disease: Secondary | ICD-10-CM | POA: Diagnosis not present

## 2020-08-08 DIAGNOSIS — R2689 Other abnormalities of gait and mobility: Secondary | ICD-10-CM | POA: Diagnosis not present

## 2020-08-08 DIAGNOSIS — M6281 Muscle weakness (generalized): Secondary | ICD-10-CM | POA: Diagnosis not present

## 2020-08-10 DIAGNOSIS — G20 Parkinson's disease: Secondary | ICD-10-CM | POA: Diagnosis not present

## 2020-08-10 DIAGNOSIS — R2681 Unsteadiness on feet: Secondary | ICD-10-CM | POA: Diagnosis not present

## 2020-08-10 DIAGNOSIS — M6281 Muscle weakness (generalized): Secondary | ICD-10-CM | POA: Diagnosis not present

## 2020-08-10 DIAGNOSIS — R2689 Other abnormalities of gait and mobility: Secondary | ICD-10-CM | POA: Diagnosis not present

## 2020-08-11 DIAGNOSIS — Z1159 Encounter for screening for other viral diseases: Secondary | ICD-10-CM | POA: Diagnosis not present

## 2020-08-12 DIAGNOSIS — M6281 Muscle weakness (generalized): Secondary | ICD-10-CM | POA: Diagnosis not present

## 2020-08-12 DIAGNOSIS — R2681 Unsteadiness on feet: Secondary | ICD-10-CM | POA: Diagnosis not present

## 2020-08-12 DIAGNOSIS — R2689 Other abnormalities of gait and mobility: Secondary | ICD-10-CM | POA: Diagnosis not present

## 2020-08-12 DIAGNOSIS — G20 Parkinson's disease: Secondary | ICD-10-CM | POA: Diagnosis not present

## 2020-08-15 DIAGNOSIS — Z1159 Encounter for screening for other viral diseases: Secondary | ICD-10-CM | POA: Diagnosis not present

## 2020-08-15 DIAGNOSIS — G20 Parkinson's disease: Secondary | ICD-10-CM | POA: Diagnosis not present

## 2020-08-15 DIAGNOSIS — R2689 Other abnormalities of gait and mobility: Secondary | ICD-10-CM | POA: Diagnosis not present

## 2020-08-15 DIAGNOSIS — R2681 Unsteadiness on feet: Secondary | ICD-10-CM | POA: Diagnosis not present

## 2020-08-15 DIAGNOSIS — M6281 Muscle weakness (generalized): Secondary | ICD-10-CM | POA: Diagnosis not present

## 2020-08-17 DIAGNOSIS — G20 Parkinson's disease: Secondary | ICD-10-CM | POA: Diagnosis not present

## 2020-08-17 DIAGNOSIS — R2689 Other abnormalities of gait and mobility: Secondary | ICD-10-CM | POA: Diagnosis not present

## 2020-08-17 DIAGNOSIS — M6281 Muscle weakness (generalized): Secondary | ICD-10-CM | POA: Diagnosis not present

## 2020-08-17 DIAGNOSIS — R2681 Unsteadiness on feet: Secondary | ICD-10-CM | POA: Diagnosis not present

## 2020-08-18 DIAGNOSIS — Z1159 Encounter for screening for other viral diseases: Secondary | ICD-10-CM | POA: Diagnosis not present

## 2020-08-22 DIAGNOSIS — G20 Parkinson's disease: Secondary | ICD-10-CM | POA: Diagnosis not present

## 2020-08-22 DIAGNOSIS — M6281 Muscle weakness (generalized): Secondary | ICD-10-CM | POA: Diagnosis not present

## 2020-08-22 DIAGNOSIS — R2681 Unsteadiness on feet: Secondary | ICD-10-CM | POA: Diagnosis not present

## 2020-08-22 DIAGNOSIS — Z1159 Encounter for screening for other viral diseases: Secondary | ICD-10-CM | POA: Diagnosis not present

## 2020-08-22 DIAGNOSIS — R2689 Other abnormalities of gait and mobility: Secondary | ICD-10-CM | POA: Diagnosis not present

## 2020-08-24 DIAGNOSIS — R2681 Unsteadiness on feet: Secondary | ICD-10-CM | POA: Diagnosis not present

## 2020-08-24 DIAGNOSIS — G20 Parkinson's disease: Secondary | ICD-10-CM | POA: Diagnosis not present

## 2020-08-24 DIAGNOSIS — R2689 Other abnormalities of gait and mobility: Secondary | ICD-10-CM | POA: Diagnosis not present

## 2020-08-24 DIAGNOSIS — M6281 Muscle weakness (generalized): Secondary | ICD-10-CM | POA: Diagnosis not present

## 2020-08-25 DIAGNOSIS — Z1159 Encounter for screening for other viral diseases: Secondary | ICD-10-CM | POA: Diagnosis not present

## 2020-08-29 DIAGNOSIS — G20 Parkinson's disease: Secondary | ICD-10-CM | POA: Diagnosis not present

## 2020-08-29 DIAGNOSIS — R2681 Unsteadiness on feet: Secondary | ICD-10-CM | POA: Diagnosis not present

## 2020-08-29 DIAGNOSIS — R2689 Other abnormalities of gait and mobility: Secondary | ICD-10-CM | POA: Diagnosis not present

## 2020-08-29 DIAGNOSIS — M6281 Muscle weakness (generalized): Secondary | ICD-10-CM | POA: Diagnosis not present

## 2020-08-31 DIAGNOSIS — G20 Parkinson's disease: Secondary | ICD-10-CM | POA: Diagnosis not present

## 2020-08-31 DIAGNOSIS — R2681 Unsteadiness on feet: Secondary | ICD-10-CM | POA: Diagnosis not present

## 2020-08-31 DIAGNOSIS — Z8616 Personal history of COVID-19: Secondary | ICD-10-CM | POA: Diagnosis not present

## 2020-08-31 DIAGNOSIS — R2689 Other abnormalities of gait and mobility: Secondary | ICD-10-CM | POA: Diagnosis not present

## 2020-08-31 DIAGNOSIS — M6281 Muscle weakness (generalized): Secondary | ICD-10-CM | POA: Diagnosis not present

## 2020-09-07 DIAGNOSIS — R2689 Other abnormalities of gait and mobility: Secondary | ICD-10-CM | POA: Diagnosis not present

## 2020-09-07 DIAGNOSIS — M6281 Muscle weakness (generalized): Secondary | ICD-10-CM | POA: Diagnosis not present

## 2020-09-07 DIAGNOSIS — G20 Parkinson's disease: Secondary | ICD-10-CM | POA: Diagnosis not present

## 2020-09-07 DIAGNOSIS — R2681 Unsteadiness on feet: Secondary | ICD-10-CM | POA: Diagnosis not present

## 2020-09-09 DIAGNOSIS — R2689 Other abnormalities of gait and mobility: Secondary | ICD-10-CM | POA: Diagnosis not present

## 2020-09-09 DIAGNOSIS — M6281 Muscle weakness (generalized): Secondary | ICD-10-CM | POA: Diagnosis not present

## 2020-09-09 DIAGNOSIS — R2681 Unsteadiness on feet: Secondary | ICD-10-CM | POA: Diagnosis not present

## 2020-09-09 DIAGNOSIS — G20 Parkinson's disease: Secondary | ICD-10-CM | POA: Diagnosis not present

## 2020-09-12 DIAGNOSIS — R2681 Unsteadiness on feet: Secondary | ICD-10-CM | POA: Diagnosis not present

## 2020-09-12 DIAGNOSIS — M6281 Muscle weakness (generalized): Secondary | ICD-10-CM | POA: Diagnosis not present

## 2020-09-12 DIAGNOSIS — G20 Parkinson's disease: Secondary | ICD-10-CM | POA: Diagnosis not present

## 2020-09-12 DIAGNOSIS — R2689 Other abnormalities of gait and mobility: Secondary | ICD-10-CM | POA: Diagnosis not present

## 2020-09-14 DIAGNOSIS — M6281 Muscle weakness (generalized): Secondary | ICD-10-CM | POA: Diagnosis not present

## 2020-09-14 DIAGNOSIS — G20 Parkinson's disease: Secondary | ICD-10-CM | POA: Diagnosis not present

## 2020-09-14 DIAGNOSIS — R2689 Other abnormalities of gait and mobility: Secondary | ICD-10-CM | POA: Diagnosis not present

## 2020-09-14 DIAGNOSIS — R2681 Unsteadiness on feet: Secondary | ICD-10-CM | POA: Diagnosis not present

## 2020-09-14 DIAGNOSIS — Z8616 Personal history of COVID-19: Secondary | ICD-10-CM | POA: Diagnosis not present

## 2020-09-19 DIAGNOSIS — M6281 Muscle weakness (generalized): Secondary | ICD-10-CM | POA: Diagnosis not present

## 2020-09-19 DIAGNOSIS — G20 Parkinson's disease: Secondary | ICD-10-CM | POA: Diagnosis not present

## 2020-09-19 DIAGNOSIS — R2681 Unsteadiness on feet: Secondary | ICD-10-CM | POA: Diagnosis not present

## 2020-09-19 DIAGNOSIS — R2689 Other abnormalities of gait and mobility: Secondary | ICD-10-CM | POA: Diagnosis not present

## 2020-09-21 DIAGNOSIS — R2689 Other abnormalities of gait and mobility: Secondary | ICD-10-CM | POA: Diagnosis not present

## 2020-09-21 DIAGNOSIS — M6281 Muscle weakness (generalized): Secondary | ICD-10-CM | POA: Diagnosis not present

## 2020-09-21 DIAGNOSIS — R2681 Unsteadiness on feet: Secondary | ICD-10-CM | POA: Diagnosis not present

## 2020-09-21 DIAGNOSIS — G20 Parkinson's disease: Secondary | ICD-10-CM | POA: Diagnosis not present

## 2020-09-29 DIAGNOSIS — M6281 Muscle weakness (generalized): Secondary | ICD-10-CM | POA: Diagnosis not present

## 2020-09-29 DIAGNOSIS — G20 Parkinson's disease: Secondary | ICD-10-CM | POA: Diagnosis not present

## 2020-09-29 DIAGNOSIS — R2689 Other abnormalities of gait and mobility: Secondary | ICD-10-CM | POA: Diagnosis not present

## 2020-09-29 DIAGNOSIS — R2681 Unsteadiness on feet: Secondary | ICD-10-CM | POA: Diagnosis not present

## 2020-09-30 DIAGNOSIS — R2681 Unsteadiness on feet: Secondary | ICD-10-CM | POA: Diagnosis not present

## 2020-09-30 DIAGNOSIS — G20 Parkinson's disease: Secondary | ICD-10-CM | POA: Diagnosis not present

## 2020-09-30 DIAGNOSIS — M6281 Muscle weakness (generalized): Secondary | ICD-10-CM | POA: Diagnosis not present

## 2020-09-30 DIAGNOSIS — R2689 Other abnormalities of gait and mobility: Secondary | ICD-10-CM | POA: Diagnosis not present

## 2020-10-03 DIAGNOSIS — R2689 Other abnormalities of gait and mobility: Secondary | ICD-10-CM | POA: Diagnosis not present

## 2020-10-03 DIAGNOSIS — M6281 Muscle weakness (generalized): Secondary | ICD-10-CM | POA: Diagnosis not present

## 2020-10-03 DIAGNOSIS — R2681 Unsteadiness on feet: Secondary | ICD-10-CM | POA: Diagnosis not present

## 2020-10-03 DIAGNOSIS — G20 Parkinson's disease: Secondary | ICD-10-CM | POA: Diagnosis not present

## 2020-10-06 DIAGNOSIS — G20 Parkinson's disease: Secondary | ICD-10-CM | POA: Diagnosis not present

## 2020-10-06 DIAGNOSIS — M6281 Muscle weakness (generalized): Secondary | ICD-10-CM | POA: Diagnosis not present

## 2020-10-06 DIAGNOSIS — R2689 Other abnormalities of gait and mobility: Secondary | ICD-10-CM | POA: Diagnosis not present

## 2020-10-06 DIAGNOSIS — R2681 Unsteadiness on feet: Secondary | ICD-10-CM | POA: Diagnosis not present

## 2020-10-12 DIAGNOSIS — M6281 Muscle weakness (generalized): Secondary | ICD-10-CM | POA: Diagnosis not present

## 2020-10-12 DIAGNOSIS — G20 Parkinson's disease: Secondary | ICD-10-CM | POA: Diagnosis not present

## 2020-10-12 DIAGNOSIS — R2689 Other abnormalities of gait and mobility: Secondary | ICD-10-CM | POA: Diagnosis not present

## 2020-10-12 DIAGNOSIS — R2681 Unsteadiness on feet: Secondary | ICD-10-CM | POA: Diagnosis not present

## 2020-10-14 ENCOUNTER — Inpatient Hospital Stay (HOSPITAL_COMMUNITY)
Admission: EM | Admit: 2020-10-14 | Discharge: 2020-10-19 | DRG: 522 | Disposition: A | Discharge status: Skilled Nursing Facility | Payer: Medicare Other | Attending: Internal Medicine | Admitting: Internal Medicine

## 2020-10-14 ENCOUNTER — Emergency Department (HOSPITAL_COMMUNITY): Payer: Medicare Other

## 2020-10-14 DIAGNOSIS — D62 Acute posthemorrhagic anemia: Secondary | ICD-10-CM | POA: Diagnosis not present

## 2020-10-14 DIAGNOSIS — Z9181 History of falling: Secondary | ICD-10-CM

## 2020-10-14 DIAGNOSIS — R2681 Unsteadiness on feet: Secondary | ICD-10-CM | POA: Diagnosis not present

## 2020-10-14 DIAGNOSIS — Z85820 Personal history of malignant melanoma of skin: Secondary | ICD-10-CM

## 2020-10-14 DIAGNOSIS — R52 Pain, unspecified: Secondary | ICD-10-CM

## 2020-10-14 DIAGNOSIS — W19XXXA Unspecified fall, initial encounter: Secondary | ICD-10-CM | POA: Diagnosis not present

## 2020-10-14 DIAGNOSIS — Z471 Aftercare following joint replacement surgery: Secondary | ICD-10-CM | POA: Diagnosis not present

## 2020-10-14 DIAGNOSIS — N183 Chronic kidney disease, stage 3 unspecified: Secondary | ICD-10-CM | POA: Diagnosis not present

## 2020-10-14 DIAGNOSIS — W1830XA Fall on same level, unspecified, initial encounter: Secondary | ICD-10-CM | POA: Diagnosis present

## 2020-10-14 DIAGNOSIS — D72829 Elevated white blood cell count, unspecified: Secondary | ICD-10-CM | POA: Diagnosis not present

## 2020-10-14 DIAGNOSIS — Z20822 Contact with and (suspected) exposure to covid-19: Secondary | ICD-10-CM | POA: Diagnosis not present

## 2020-10-14 DIAGNOSIS — S72001D Fracture of unspecified part of neck of right femur, subsequent encounter for closed fracture with routine healing: Secondary | ICD-10-CM | POA: Diagnosis not present

## 2020-10-14 DIAGNOSIS — I10 Essential (primary) hypertension: Secondary | ICD-10-CM | POA: Diagnosis not present

## 2020-10-14 DIAGNOSIS — Z79899 Other long term (current) drug therapy: Secondary | ICD-10-CM

## 2020-10-14 DIAGNOSIS — N1832 Chronic kidney disease, stage 3b: Secondary | ICD-10-CM | POA: Diagnosis not present

## 2020-10-14 DIAGNOSIS — G20 Parkinson's disease: Secondary | ICD-10-CM | POA: Diagnosis present

## 2020-10-14 DIAGNOSIS — Z888 Allergy status to other drugs, medicaments and biological substances status: Secondary | ICD-10-CM

## 2020-10-14 DIAGNOSIS — M2580 Other specified joint disorders, unspecified joint: Secondary | ICD-10-CM | POA: Diagnosis not present

## 2020-10-14 DIAGNOSIS — Z9889 Other specified postprocedural states: Secondary | ICD-10-CM | POA: Diagnosis not present

## 2020-10-14 DIAGNOSIS — Z885 Allergy status to narcotic agent status: Secondary | ICD-10-CM

## 2020-10-14 DIAGNOSIS — K5909 Other constipation: Secondary | ICD-10-CM | POA: Diagnosis present

## 2020-10-14 DIAGNOSIS — S72001A Fracture of unspecified part of neck of right femur, initial encounter for closed fracture: Principal | ICD-10-CM | POA: Diagnosis present

## 2020-10-14 DIAGNOSIS — Z91048 Other nonmedicinal substance allergy status: Secondary | ICD-10-CM

## 2020-10-14 DIAGNOSIS — Z23 Encounter for immunization: Secondary | ICD-10-CM

## 2020-10-14 DIAGNOSIS — Z8249 Family history of ischemic heart disease and other diseases of the circulatory system: Secondary | ICD-10-CM | POA: Diagnosis not present

## 2020-10-14 DIAGNOSIS — M25551 Pain in right hip: Secondary | ICD-10-CM | POA: Diagnosis not present

## 2020-10-14 DIAGNOSIS — M25572 Pain in left ankle and joints of left foot: Secondary | ICD-10-CM | POA: Diagnosis not present

## 2020-10-14 DIAGNOSIS — N400 Enlarged prostate without lower urinary tract symptoms: Secondary | ICD-10-CM | POA: Diagnosis present

## 2020-10-14 DIAGNOSIS — G629 Polyneuropathy, unspecified: Secondary | ICD-10-CM | POA: Diagnosis not present

## 2020-10-14 DIAGNOSIS — E785 Hyperlipidemia, unspecified: Secondary | ICD-10-CM | POA: Diagnosis not present

## 2020-10-14 DIAGNOSIS — F5104 Psychophysiologic insomnia: Secondary | ICD-10-CM

## 2020-10-14 DIAGNOSIS — R296 Repeated falls: Secondary | ICD-10-CM | POA: Diagnosis present

## 2020-10-14 DIAGNOSIS — R404 Transient alteration of awareness: Secondary | ICD-10-CM | POA: Diagnosis not present

## 2020-10-14 DIAGNOSIS — J9811 Atelectasis: Secondary | ICD-10-CM | POA: Diagnosis not present

## 2020-10-14 DIAGNOSIS — F32A Depression, unspecified: Secondary | ICD-10-CM | POA: Diagnosis present

## 2020-10-14 DIAGNOSIS — R2689 Other abnormalities of gait and mobility: Secondary | ICD-10-CM | POA: Diagnosis not present

## 2020-10-14 DIAGNOSIS — I129 Hypertensive chronic kidney disease with stage 1 through stage 4 chronic kidney disease, or unspecified chronic kidney disease: Secondary | ICD-10-CM | POA: Diagnosis not present

## 2020-10-14 DIAGNOSIS — F0280 Dementia in other diseases classified elsewhere without behavioral disturbance: Secondary | ICD-10-CM | POA: Diagnosis present

## 2020-10-14 DIAGNOSIS — S7291XP Unspecified fracture of right femur, subsequent encounter for closed fracture with malunion: Secondary | ICD-10-CM | POA: Diagnosis not present

## 2020-10-14 DIAGNOSIS — Y92099 Unspecified place in other non-institutional residence as the place of occurrence of the external cause: Secondary | ICD-10-CM

## 2020-10-14 DIAGNOSIS — G2581 Restless legs syndrome: Secondary | ICD-10-CM | POA: Diagnosis not present

## 2020-10-14 DIAGNOSIS — Z043 Encounter for examination and observation following other accident: Secondary | ICD-10-CM | POA: Diagnosis not present

## 2020-10-14 DIAGNOSIS — Z808 Family history of malignant neoplasm of other organs or systems: Secondary | ICD-10-CM

## 2020-10-14 DIAGNOSIS — Q613 Polycystic kidney, unspecified: Secondary | ICD-10-CM | POA: Diagnosis not present

## 2020-10-14 DIAGNOSIS — R0902 Hypoxemia: Secondary | ICD-10-CM | POA: Diagnosis not present

## 2020-10-14 DIAGNOSIS — F419 Anxiety disorder, unspecified: Secondary | ICD-10-CM | POA: Diagnosis present

## 2020-10-14 DIAGNOSIS — K219 Gastro-esophageal reflux disease without esophagitis: Secondary | ICD-10-CM | POA: Diagnosis not present

## 2020-10-14 DIAGNOSIS — S72041A Displaced fracture of base of neck of right femur, initial encounter for closed fracture: Secondary | ICD-10-CM | POA: Diagnosis not present

## 2020-10-14 DIAGNOSIS — Z91040 Latex allergy status: Secondary | ICD-10-CM | POA: Diagnosis not present

## 2020-10-14 DIAGNOSIS — Z96641 Presence of right artificial hip joint: Secondary | ICD-10-CM | POA: Diagnosis not present

## 2020-10-14 DIAGNOSIS — R600 Localized edema: Secondary | ICD-10-CM | POA: Diagnosis not present

## 2020-10-14 DIAGNOSIS — M6281 Muscle weakness (generalized): Secondary | ICD-10-CM | POA: Diagnosis not present

## 2020-10-14 DIAGNOSIS — S199XXA Unspecified injury of neck, initial encounter: Secondary | ICD-10-CM | POA: Diagnosis not present

## 2020-10-14 DIAGNOSIS — Z87898 Personal history of other specified conditions: Secondary | ICD-10-CM

## 2020-10-14 DIAGNOSIS — S7291XA Unspecified fracture of right femur, initial encounter for closed fracture: Secondary | ICD-10-CM | POA: Diagnosis present

## 2020-10-14 DIAGNOSIS — Z7401 Bed confinement status: Secondary | ICD-10-CM | POA: Diagnosis not present

## 2020-10-14 DIAGNOSIS — S0990XA Unspecified injury of head, initial encounter: Secondary | ICD-10-CM | POA: Diagnosis not present

## 2020-10-14 HISTORY — DX: Family history of other specified conditions: Z84.89

## 2020-10-14 HISTORY — DX: Nausea with vomiting, unspecified: R11.2

## 2020-10-14 HISTORY — DX: Other specified postprocedural states: Z98.890

## 2020-10-14 LAB — CBC WITH DIFFERENTIAL/PLATELET
Abs Immature Granulocytes: 0.02 10*3/uL (ref 0.00–0.07)
Basophils Absolute: 0 10*3/uL (ref 0.0–0.1)
Basophils Relative: 1 %
Eosinophils Absolute: 0.3 10*3/uL (ref 0.0–0.5)
Eosinophils Relative: 5 %
HCT: 36.4 % — ABNORMAL LOW (ref 39.0–52.0)
Hemoglobin: 11.6 g/dL — ABNORMAL LOW (ref 13.0–17.0)
Immature Granulocytes: 0 %
Lymphocytes Relative: 23 %
Lymphs Abs: 1.4 10*3/uL (ref 0.7–4.0)
MCH: 30.9 pg (ref 26.0–34.0)
MCHC: 31.9 g/dL (ref 30.0–36.0)
MCV: 96.8 fL (ref 80.0–100.0)
Monocytes Absolute: 0.7 10*3/uL (ref 0.1–1.0)
Monocytes Relative: 11 %
Neutro Abs: 3.7 10*3/uL (ref 1.7–7.7)
Neutrophils Relative %: 60 %
Platelets: 141 10*3/uL — ABNORMAL LOW (ref 150–400)
RBC: 3.76 MIL/uL — ABNORMAL LOW (ref 4.22–5.81)
RDW: 13.4 % (ref 11.5–15.5)
WBC: 6 10*3/uL (ref 4.0–10.5)
nRBC: 0 % (ref 0.0–0.2)

## 2020-10-14 MED ORDER — FENTANYL CITRATE PF 50 MCG/ML IJ SOSY
25.0000 ug | PREFILLED_SYRINGE | Freq: Once | INTRAMUSCULAR | Status: AC
  Administered 2020-10-14: 25 ug via INTRAVENOUS
  Filled 2020-10-14: qty 1

## 2020-10-14 MED ORDER — ONDANSETRON HCL 4 MG/2ML IJ SOLN
4.0000 mg | Freq: Once | INTRAMUSCULAR | Status: AC
  Administered 2020-10-14: 4 mg via INTRAVENOUS
  Filled 2020-10-14: qty 2

## 2020-10-14 NOTE — ED Provider Notes (Signed)
Emergency Department Provider Note   I have reviewed the triage vital signs and the nursing notes.   HISTORY  Chief Complaint Fall   HPI Henry Adams is a 80 y.o. male with past medical history reviewed below including polycystic kidney, Parkinson's, dementia, presents to the emergency department from nursing facility after unwitnessed fall.  He was found on the ground with severe pain in the right hip.  EMS deny any shortening or abnormal rotation but did apply a tied sheet in a binder fashion for comfort.  Patient was placed in a cervical collar.  He had a fall within the past week with small amount of bruising to the left forehead which the daughter, at bedside, states is old.  They did not seek care after the fall.  Patient does not recall the circumstances surrounding the fall.  Daughter reports that the patient is at his mental status baseline currently.   Level 5 caveat: Dementia    Past Medical History:  Diagnosis Date   Allergy    perennial   Anxiety    Arthritis    Cholelithiasis    Chronic idiopathic constipation    CKD (chronic kidney disease)    Depression    Diverticulosis    Enlarged prostate    Family history of adverse reaction to anesthesia    Gallstones    GERD (gastroesophageal reflux disease)    HLD (hyperlipidemia)    Hx of cardiovascular stress test    ETT-Myoview 6/14: probable low risk study, inf defect with normal inf wall motion-cannot r/o ischemia; prominent gut uptake adjacent to inf wall on rest images-poss defect is atten, EF 63%   Hypertension    Melanoma ([place])    Parkinsonism ([place])    Polycystic kidney disease    PONV (postoperative nausea and vomiting)     Patient Active Problem List   Diagnosis Date Noted   Right femoral fracture ([place]) 10/15/2020   Tinea corporis 02/11/2020   Allergic contact dermatitis due to drugs in contact with skin 02/11/2020   Acute bronchitis due to COVID-19 virus 02/05/2020   Traumatic coccydynia  10/05/2019   Pressure injury of buttock, stage 1 10/05/2019   Irritable bowel syndrome with both constipation and diarrhea 08/18/2019   Dysuria 08/18/2019   Epigastric abdominal tenderness without rebound tenderness 05/21/2019   Psychophysiological insomnia 04/10/2019   Benign prostatic hyperplasia with urinary hesitancy 06/25/2018   Parkinsonism ([place])    UTI (urinary tract infection) 06/16/2018   CRI (chronic renal insufficiency), stage 3 (moderate) ([place]) 03/27/2018   Restless leg syndrome 01/23/2018   Calculus of gallbladder with chronic cholecystitis without obstruction 09/18/2017   Chronic idiopathic constipation 08/19/2017   BPH associated with nocturia 08/11/2015   Vocal cord atrophy 09/01/2013   Essential hypertension 04/26/2007   Allergic rhinitis 04/26/2007   DEGENERATIVE JOINT DISEASE 04/26/2007   G E R D 08/05/2006   Hyperlipidemia with target LDL less than 160 05/30/2006   Polycystic kidney 05/30/2006    Past Surgical History:  Procedure Laterality Date   COLONOSCOPY  2011   diverticulosis   HERNIA REPAIR     umbilical   KNEE SURGERY Left    medialization laryngoplasty  10/15/2013   Dr Joya Gaskins, [facility]   TONSILLECTOMY AND ADENOIDECTOMY      Allergies Iodine, Oxycodone, Melatonin, Pollen extract, Tamiflu [oseltamivir phosphate], Tamsulosin, Doxazosin, Etodolac, Latex, and Tape  Family History  Problem Relation Age of Onset   Hypertension Father    Cancer Brother  mouth   Cancer Sister        breast   Heart disease Brother        heart transplant   Cancer Daughter        breast   Stroke Sister        >65   Skin cancer Brother        squamous cell   Colon cancer Neg Hx     Social History Social History   Tobacco Use   Smoking status: Never   Smokeless tobacco: Never  Vaping Use   Vaping Use: Never used  Substance Use Topics   Alcohol use: Not Currently    Alcohol/week: 0.0 standard drinks   Drug use: No    Review of  Systems  Constitutional: No fever/chills Cardiovascular: Denies chest pain. Respiratory: Denies shortness of breath. Gastrointestinal: No abdominal pain.   Musculoskeletal: Positive right hip pain.  Skin: Bruising to the left forehead.  Neurological: Negative for headaches  10-point ROS otherwise negative.  ____________________________________________   PHYSICAL EXAM:  VITAL SIGNS: ED Triage Vitals  Enc Vitals Group     BP 10/14/20 2315 (!) 147/81     Pulse Rate 10/14/20 2315 71     Resp 10/14/20 2315 15     Temp 10/14/20 2315 97.9 F (36.6 C)     Temp Source 10/14/20 2315 Oral     SpO2 10/14/20 2315 93 %     Weight 10/14/20 2316 170 lb (77.1 kg)     Height 10/14/20 2316 5\' 11"  (1.803 m)   Constitutional: Alert but confusion at baseline with dementia. Well appearing and in no acute distress. Eyes: Conjunctivae are normal. PERRL.  Head: Atraumatic. Nose: No congestion/rhinnorhea. Mouth/Throat: Mucous membranes are moist.  Oropharynx non-erythematous. Neck: No stridor. C collar in place.  Cardiovascular: Normal rate, regular rhythm. Good peripheral circulation. Grossly normal heart sounds.   Respiratory: Normal respiratory effort.  No retractions. Lungs CTAB. Gastrointestinal: Soft and nontender. No distention.  Musculoskeletal: Right hip slightly flexed. No abnormal rotation.  Neurologic:  Normal speech and language. Intention tremor (baseline).  Skin:  Skin is warm, dry and intact. No rash noted.   ____________________________________________   LABS (all labs ordered are listed, but only abnormal results are displayed)  Labs Reviewed  URINE CULTURE - Abnormal; Notable for the following components:      Result Value   Culture   (*)    Value: <10,000 COLONIES/mL INSIGNIFICANT GROWTH Performed at [facility], [place] [address] [place], [place] [address]    All other components within normal limits  COMPREHENSIVE METABOLIC PANEL - Abnormal; Notable for the  following components:   Glucose, Bld 116 (*)    BUN 39 (*)    Creatinine, Ser 1.94 (*)    Calcium 8.4 (*)    Albumin 3.3 (*)    GFR, Estimated 34 (*)    All other components within normal limits  CBC WITH DIFFERENTIAL/PLATELET - Abnormal; Notable for the following components:   RBC 3.76 (*)    Hemoglobin 11.6 (*)    HCT 36.4 (*)    Platelets 141 (*)    All other components within normal limits  URINALYSIS, ROUTINE W REFLEX MICROSCOPIC - Abnormal; Notable for the following components:   Hgb urine dipstick SMALL (*)    Protein, ur 100 (*)    All other components within normal limits  CBC - Abnormal; Notable for the following components:   WBC 11.0 (*)    RBC 3.89 (*)  Hemoglobin 12.3 (*)    HCT 37.2 (*)    All other components within normal limits  BASIC METABOLIC PANEL - Abnormal; Notable for the following components:   Glucose, Bld 115 (*)    BUN 33 (*)    Creatinine, Ser 1.69 (*)    Calcium 8.6 (*)    GFR, Estimated 40 (*)    All other components within normal limits  CBC - Abnormal; Notable for the following components:   WBC 12.5 (*)    RBC 3.67 (*)    Hemoglobin 11.6 (*)    HCT 34.4 (*)    Platelets 131 (*)    All other components within normal limits  BASIC METABOLIC PANEL - Abnormal; Notable for the following components:   Sodium 134 (*)    Glucose, Bld 121 (*)    BUN 36 (*)    Creatinine, Ser 1.90 (*)    Calcium 8.0 (*)    GFR, Estimated 35 (*)    All other components within normal limits  RESP PANEL BY RT-PCR (FLU A&B, COVID) ARPGX2  SURGICAL PCR SCREEN  MAGNESIUM  PHOSPHORUS  TYPE AND SCREEN  ABO/RH   ____________________________________________  EKG   EKG Interpretation  Date/Time:  Friday October 14 2020 23:15:40 EDT Ventricular Rate:  77 PR Interval:  209 QRS Duration: 103 QT Interval:  421 QTC Calculation: 451 R Axis:   -53 Text Interpretation: Sinus rhythm Atrial premature complexes Left anterior fascicular block Abnormal R-wave  progression, early transition Consider anterior infarct Confirmed by Nanda Quinton [phone]) on 10/14/2020 11:50:33 PM Also confirmed by Nanda Quinton [phone]), editor Dola, LaVerne [phone])  on 10/15/2020 9:59:53 AM        ____________________________________________  RADIOLOGY  DG Chest 1 View  Result Date: 10/15/2020 CLINICAL DATA:  Unwitnessed fall from standing. EXAM: CHEST  1 VIEW COMPARISON:  02/22/2020 FINDINGS: Patient is rotated. Lung volumes are low. Prominent heart size likely accentuated by technique. Bandlike opacity in the right suprahilar region favors atelectasis. Vascular congestion versus bronchovascular crowding. No visualized pneumothorax or confluent airspace disease. No pleural fluid. No acute osseous abnormalities are seen. IMPRESSION: Rotated exam with low lung volumes and right suprahilar atelectasis. Vascular congestion versus bronchovascular crowding. Electronically Signed   By: Keith Rake M.D.   On: 10/15/2020 00:43   CT HEAD WO CONTRAST (5MM)  Result Date: 10/15/2020 CLINICAL DATA:  Head trauma, minor (Age >= 65y) Unwitnessed fall. EXAM: CT HEAD WITHOUT CONTRAST TECHNIQUE: Contiguous axial images were obtained from the base of the skull through the vertex without intravenous contrast. COMPARISON:  02/16/2020, brain MRI 02/10/2017 FINDINGS: Brain: Stable degree of atrophy and chronic small vessel ischemia. No intracranial hemorrhage, mass effect, or midline shift. The previous 1 cm meningioma involving the posterior aspect of the foramen magnum is grossly stable from prior MRI, though not well assessed by CT, series 3, image 3. There is no associated mass effect. No hydrocephalus. The basilar cisterns are patent. No evidence of territorial infarct or acute ischemia. No extra-axial or intracranial fluid collection. Vascular: Atherosclerosis of skullbase vasculature without hyperdense vessel or abnormal calcification. Skull: No fracture or focal lesion. Sinuses/Orbits:  Mucous retention cyst in the left maxillary sinus. No acute findings. Mastoid air cells are clear. Other: None. IMPRESSION: 1. No acute intracranial abnormality. No skull fracture. 2. Stable atrophy and chronic small vessel ischemia. 3. Unchanged small meningioma at the foramen magnum. Electronically Signed   By: Keith Rake M.D.   On: 10/15/2020 00:39   CT Cervical Spine Wo  Contrast  Result Date: 10/15/2020 CLINICAL DATA:  Neck trauma (Age >= 65y) Unwitnessed fall from standing. EXAM: CT CERVICAL SPINE WITHOUT CONTRAST TECHNIQUE: Multidetector CT imaging of the cervical spine was performed without intravenous contrast. Multiplanar CT image reconstructions were also generated. COMPARISON:  None. FINDINGS: Alignment: Trace degenerative type anterolisthesis of C7 on T1. No traumatic subluxation. Skull base and vertebrae: No acute fracture. Vertebral body heights are maintained. The dens and skull base are intact. Soft tissues and spinal canal: No prevertebral fluid or swelling. No visible canal hematoma. Disc levels: Diffuse degenerative disc disease, advanced at C5-C6 and C6-C7. There is prominent multilevel facet hypertrophy Upper chest: No acute or unexpected findings. Other: None. IMPRESSION: 1. No acute fracture or subluxation of the cervical spine. 2. Multilevel degenerative disc disease and facet hypertrophy. Electronically Signed   By: Keith Rake M.D.   On: 10/15/2020 00:42   DG Hip Unilat W or Wo Pelvis 2-3 Views Right  Result Date: 10/15/2020 CLINICAL DATA:  Unwitnessed fall from standing.  Right hip pain. EXAM: DG HIP (WITH OR WITHOUT PELVIS) 2-3V RIGHT COMPARISON:  None. FINDINGS: Displaced right femoral neck fracture. Mild proximal migration of the femoral shaft. Femoral head remains seated. The bones are diffusely under mineralized. No pubic rami fracture. No pubic symphyseal or sacroiliac joint diastasis. IMPRESSION: Displaced right femoral neck fracture. Electronically Signed   By:  Keith Rake M.D.   On: 10/15/2020 00:44    ____________________________________________   PROCEDURES  Procedure(s) performed:   Procedures  None  ____________________________________________   INITIAL IMPRESSION / ASSESSMENT AND PLAN / ED COURSE  Pertinent labs & imaging results that were available during my care of the patient were reviewed by me and considered in my medical decision making (see chart for details).   Patient presents emergency department valuation of fall, unwitnessed.  History limited by the patient's underlying dementia.  Pain with movement of the right hip.  The extremity is neurovascularly intact.  CT imaging of the head and cervical spine unremarkable.  Plain films of the right hip showing femoral neck fracture. Labs and COVID reviewed. Willl discuss with orhto.   Spoke with Dr. Lynann Bologna regarding hip fracture. They will consult in the AM to plan for repair.   Discussed patient's case with [facility] to request admission. Patient and family (if present) updated with plan. Care transferred to [facility] service.  I reviewed all nursing notes, vitals, pertinent old records, EKGs, labs, imaging (as available).  ____________________________________________  FINAL CLINICAL IMPRESSION(S) / ED DIAGNOSES  Final diagnoses:  Closed fracture of right hip, initial encounter ([place])  Fall, initial encounter     MEDICATIONS GIVEN DURING THIS VISIT:  Medications  alfuzosin (UROXATRAL) 24 hr tablet 10 mg (10 mg Oral Given 10/16/20 1656)  pantoprazole (PROTONIX) EC tablet 40 mg (40 mg Oral Given 10/16/20 0816)  acetaminophen (TYLENOL) tablet 650 mg (650 mg Oral Given 10/16/20 1656)  naloxone [facility]) injection 0.4 mg ( Intravenous MAR Unhold 10/15/20 1803)  lactated ringers infusion (0 mLs Intravenous Stopped 10/16/20 0340)  hydrALAZINE (APRESOLINE) injection 5 mg ( Intravenous MAR Unhold 10/15/20 1803)  ondansetron (ZOFRAN) injection 4 mg ( Intravenous MAR Unhold 10/15/20 1803)   HYDROmorphone (DILAUDID) injection 0.5 mg ( Intravenous MAR Unhold 10/15/20 1803)  Carbidopa-Levodopa ER (SINEMET CR) 25-100 MG tablet controlled release 1 tablet (1 tablet Oral Given 10/16/20 2208)  Carbidopa-Levodopa ER (SINEMET CR) 25-100 MG tablet controlled release 1 tablet (1 tablet Oral Given 10/16/20 2209)  carboxymethylcellulose (REFRESH PLUS) 0.5 % ophthalmic solution 1  drop ( Both Eyes MAR Unhold 10/15/20 1803)  clonazePAM (KLONOPIN) tablet 0.5 mg (0.5 mg Oral Given 10/16/20 2208)  donepezil (ARICEPT) tablet 10 mg (10 mg Oral Given 10/16/20 2208)  finasteride (PROSCAR) tablet 5 mg (5 mg Oral Given 10/16/20 0817)  FLUoxetine (PROZAC) capsule 10 mg (10 mg Oral Given 10/16/20 0818)  memantine (NAMENDA) tablet 10 mg (10 mg Oral Given 10/16/20 2208)  influenza vaccine adjuvanted (FLUAD) injection 0.5 mL (has no administration in time range)  enoxaparin (LOVENOX) injection 40 mg (40 mg Subcutaneous Given 10/16/20 0818)  bisacodyl (DULCOLAX) EC tablet 5 mg (has no administration in time range)  senna-docusate (Senokot-S) tablet 2 tablet (2 tablets Oral Given 10/16/20 2211)  melatonin tablet 3 mg (3 mg Oral Not Given 10/16/20 1817)  haloperidol lactate (HALDOL) injection 1 mg (has no administration in time range)  fentaNYL (SUBLIMAZE) injection 25 mcg (25 mcg Intravenous Given 10/14/20 2343)  ondansetron (ZOFRAN) injection 4 mg (4 mg Intravenous Given 10/14/20 2342)     Note:  This document was prepared using Dragon voice recognition software and may include unintentional dictation errors.  Nanda Quinton, MD, [facility] Emergency Medicine    Ader Fritze, [facility], MD 10/17/20 [phone]

## 2020-10-14 NOTE — ED Triage Notes (Addendum)
Pt bib [facility] from [place] for a unwitnessed fall from standing. Pt c/o R hip pain. No shortening or rotation - EMS placed pelvic binder for comfort. Pt also with hematoma to L forehead from previous fall this past week that he was not evaluated for. C collar in place on arrival . Hx of alzheimers - daughter states pt is at mental status baseline.

## 2020-10-15 ENCOUNTER — Encounter (HOSPITAL_COMMUNITY): Payer: Self-pay | Admitting: Internal Medicine

## 2020-10-15 ENCOUNTER — Inpatient Hospital Stay (HOSPITAL_COMMUNITY): Payer: Medicare Other

## 2020-10-15 ENCOUNTER — Emergency Department (HOSPITAL_COMMUNITY): Payer: Medicare Other

## 2020-10-15 ENCOUNTER — Other Ambulatory Visit: Payer: Self-pay

## 2020-10-15 ENCOUNTER — Inpatient Hospital Stay (HOSPITAL_COMMUNITY): Payer: Medicare Other | Admitting: Anesthesiology

## 2020-10-15 ENCOUNTER — Encounter (HOSPITAL_COMMUNITY)
Admission: EM | Disposition: A | Discharge status: Skilled Nursing Facility | Payer: Self-pay | Source: Home / Self Care | Attending: Internal Medicine

## 2020-10-15 DIAGNOSIS — N1832 Chronic kidney disease, stage 3b: Secondary | ICD-10-CM | POA: Diagnosis present

## 2020-10-15 DIAGNOSIS — F419 Anxiety disorder, unspecified: Secondary | ICD-10-CM | POA: Diagnosis present

## 2020-10-15 DIAGNOSIS — F32A Depression, unspecified: Secondary | ICD-10-CM | POA: Diagnosis present

## 2020-10-15 DIAGNOSIS — Z23 Encounter for immunization: Secondary | ICD-10-CM | POA: Diagnosis present

## 2020-10-15 DIAGNOSIS — Z91048 Other nonmedicinal substance allergy status: Secondary | ICD-10-CM | POA: Diagnosis not present

## 2020-10-15 DIAGNOSIS — Q613 Polycystic kidney, unspecified: Secondary | ICD-10-CM | POA: Diagnosis not present

## 2020-10-15 DIAGNOSIS — Z85820 Personal history of malignant melanoma of skin: Secondary | ICD-10-CM | POA: Diagnosis not present

## 2020-10-15 DIAGNOSIS — Z20822 Contact with and (suspected) exposure to covid-19: Secondary | ICD-10-CM | POA: Diagnosis present

## 2020-10-15 DIAGNOSIS — G629 Polyneuropathy, unspecified: Secondary | ICD-10-CM | POA: Diagnosis present

## 2020-10-15 DIAGNOSIS — I129 Hypertensive chronic kidney disease with stage 1 through stage 4 chronic kidney disease, or unspecified chronic kidney disease: Secondary | ICD-10-CM | POA: Diagnosis present

## 2020-10-15 DIAGNOSIS — D62 Acute posthemorrhagic anemia: Secondary | ICD-10-CM | POA: Diagnosis not present

## 2020-10-15 DIAGNOSIS — S7291XP Unspecified fracture of right femur, subsequent encounter for closed fracture with malunion: Secondary | ICD-10-CM

## 2020-10-15 DIAGNOSIS — Z885 Allergy status to narcotic agent status: Secondary | ICD-10-CM | POA: Diagnosis not present

## 2020-10-15 DIAGNOSIS — D72829 Elevated white blood cell count, unspecified: Secondary | ICD-10-CM | POA: Diagnosis present

## 2020-10-15 DIAGNOSIS — W19XXXA Unspecified fall, initial encounter: Secondary | ICD-10-CM | POA: Diagnosis not present

## 2020-10-15 DIAGNOSIS — S72001A Fracture of unspecified part of neck of right femur, initial encounter for closed fracture: Secondary | ICD-10-CM | POA: Diagnosis present

## 2020-10-15 DIAGNOSIS — S7291XA Unspecified fracture of right femur, initial encounter for closed fracture: Secondary | ICD-10-CM | POA: Diagnosis present

## 2020-10-15 DIAGNOSIS — R296 Repeated falls: Secondary | ICD-10-CM | POA: Diagnosis present

## 2020-10-15 DIAGNOSIS — Z8249 Family history of ischemic heart disease and other diseases of the circulatory system: Secondary | ICD-10-CM | POA: Diagnosis not present

## 2020-10-15 DIAGNOSIS — N400 Enlarged prostate without lower urinary tract symptoms: Secondary | ICD-10-CM | POA: Diagnosis present

## 2020-10-15 DIAGNOSIS — S72041A Displaced fracture of base of neck of right femur, initial encounter for closed fracture: Secondary | ICD-10-CM

## 2020-10-15 DIAGNOSIS — Z91040 Latex allergy status: Secondary | ICD-10-CM | POA: Diagnosis not present

## 2020-10-15 DIAGNOSIS — E785 Hyperlipidemia, unspecified: Secondary | ICD-10-CM | POA: Diagnosis present

## 2020-10-15 DIAGNOSIS — W1830XA Fall on same level, unspecified, initial encounter: Secondary | ICD-10-CM | POA: Diagnosis present

## 2020-10-15 DIAGNOSIS — Z888 Allergy status to other drugs, medicaments and biological substances status: Secondary | ICD-10-CM | POA: Diagnosis not present

## 2020-10-15 DIAGNOSIS — Y92099 Unspecified place in other non-institutional residence as the place of occurrence of the external cause: Secondary | ICD-10-CM | POA: Diagnosis not present

## 2020-10-15 DIAGNOSIS — F0280 Dementia in other diseases classified elsewhere without behavioral disturbance: Secondary | ICD-10-CM | POA: Diagnosis present

## 2020-10-15 DIAGNOSIS — G20 Parkinson's disease: Secondary | ICD-10-CM | POA: Diagnosis present

## 2020-10-15 DIAGNOSIS — K5909 Other constipation: Secondary | ICD-10-CM | POA: Diagnosis present

## 2020-10-15 DIAGNOSIS — G2581 Restless legs syndrome: Secondary | ICD-10-CM | POA: Diagnosis present

## 2020-10-15 HISTORY — PX: ANTERIOR APPROACH HEMI HIP ARTHROPLASTY: SHX6690

## 2020-10-15 LAB — CBC
HCT: 37.2 % — ABNORMAL LOW (ref 39.0–52.0)
Hemoglobin: 12.3 g/dL — ABNORMAL LOW (ref 13.0–17.0)
MCH: 31.6 pg (ref 26.0–34.0)
MCHC: 33.1 g/dL (ref 30.0–36.0)
MCV: 95.6 fL (ref 80.0–100.0)
Platelets: 153 10*3/uL (ref 150–400)
RBC: 3.89 MIL/uL — ABNORMAL LOW (ref 4.22–5.81)
RDW: 13.4 % (ref 11.5–15.5)
WBC: 11 10*3/uL — ABNORMAL HIGH (ref 4.0–10.5)
nRBC: 0 % (ref 0.0–0.2)

## 2020-10-15 LAB — URINALYSIS, ROUTINE W REFLEX MICROSCOPIC
Bacteria, UA: NONE SEEN
Bilirubin Urine: NEGATIVE
Glucose, UA: NEGATIVE mg/dL
Ketones, ur: NEGATIVE mg/dL
Leukocytes,Ua: NEGATIVE
Nitrite: NEGATIVE
Protein, ur: 100 mg/dL — AB
Specific Gravity, Urine: 1.017 (ref 1.005–1.030)
pH: 5 (ref 5.0–8.0)

## 2020-10-15 LAB — TYPE AND SCREEN
ABO/RH(D): A POS
Antibody Screen: NEGATIVE

## 2020-10-15 LAB — COMPREHENSIVE METABOLIC PANEL
ALT: 7 U/L (ref 0–44)
AST: 23 U/L (ref 15–41)
Albumin: 3.3 g/dL — ABNORMAL LOW (ref 3.5–5.0)
Alkaline Phosphatase: 85 U/L (ref 38–126)
Anion gap: 7 (ref 5–15)
BUN: 39 mg/dL — ABNORMAL HIGH (ref 8–23)
CO2: 23 mmol/L (ref 22–32)
Calcium: 8.4 mg/dL — ABNORMAL LOW (ref 8.9–10.3)
Chloride: 106 mmol/L (ref 98–111)
Creatinine, Ser: 1.94 mg/dL — ABNORMAL HIGH (ref 0.61–1.24)
GFR, Estimated: 34 mL/min — ABNORMAL LOW (ref >60)
Glucose, Bld: 116 mg/dL — ABNORMAL HIGH (ref 70–99)
Potassium: 4.1 mmol/L (ref 3.5–5.1)
Sodium: 136 mmol/L (ref 135–145)
Total Bilirubin: 0.8 mg/dL (ref 0.3–1.2)
Total Protein: 6.9 g/dL (ref 6.5–8.1)

## 2020-10-15 LAB — BASIC METABOLIC PANEL
Anion gap: 8 (ref 5–15)
BUN: 33 mg/dL — ABNORMAL HIGH (ref 8–23)
CO2: 23 mmol/L (ref 22–32)
Calcium: 8.6 mg/dL — ABNORMAL LOW (ref 8.9–10.3)
Chloride: 105 mmol/L (ref 98–111)
Creatinine, Ser: 1.69 mg/dL — ABNORMAL HIGH (ref 0.61–1.24)
GFR, Estimated: 40 mL/min — ABNORMAL LOW (ref >60)
Glucose, Bld: 115 mg/dL — ABNORMAL HIGH (ref 70–99)
Potassium: 3.9 mmol/L (ref 3.5–5.1)
Sodium: 136 mmol/L (ref 135–145)

## 2020-10-15 LAB — MAGNESIUM: Magnesium: 2.1 mg/dL (ref 1.7–2.4)

## 2020-10-15 LAB — RESP PANEL BY RT-PCR (FLU A&B, COVID) ARPGX2
Influenza A by PCR: NEGATIVE
Influenza B by PCR: NEGATIVE
SARS Coronavirus 2 by RT PCR: NEGATIVE

## 2020-10-15 LAB — SURGICAL PCR SCREEN
MRSA, PCR: NEGATIVE
Staphylococcus aureus: NEGATIVE

## 2020-10-15 LAB — ABO/RH: ABO/RH(D): A POS

## 2020-10-15 LAB — PHOSPHORUS: Phosphorus: 3 mg/dL (ref 2.5–4.6)

## 2020-10-15 SURGERY — HEMIARTHROPLASTY, HIP, DIRECT ANTERIOR APPROACH, FOR FRACTURE
Anesthesia: Spinal | Site: Hip | Laterality: Right

## 2020-10-15 MED ORDER — ENOXAPARIN SODIUM 40 MG/0.4ML IJ SOSY
40.0000 mg | PREFILLED_SYRINGE | INTRAMUSCULAR | Status: DC
  Administered 2020-10-16 – 2020-10-19 (×4): 40 mg via SUBCUTANEOUS
  Filled 2020-10-15 (×4): qty 0.4

## 2020-10-15 MED ORDER — CARBIDOPA-LEVODOPA ER 25-100 MG PO TBCR
1.0000 | EXTENDED_RELEASE_TABLET | Freq: Three times a day (TID) | ORAL | Status: DC
  Administered 2020-10-16 – 2020-10-17 (×4): 1 via ORAL
  Filled 2020-10-15 (×8): qty 1

## 2020-10-15 MED ORDER — PROPOFOL 500 MG/50ML IV EMUL
INTRAVENOUS | Status: DC | PRN
  Administered 2020-10-15: 50 ug/kg/min via INTRAVENOUS

## 2020-10-15 MED ORDER — BUPIVACAINE IN DEXTROSE 0.75-8.25 % IT SOLN
INTRATHECAL | Status: DC | PRN
  Administered 2020-10-15: 15 mg via INTRATHECAL

## 2020-10-15 MED ORDER — TRANEXAMIC ACID-NACL 1000-0.7 MG/100ML-% IV SOLN
INTRAVENOUS | Status: AC
  Filled 2020-10-15: qty 100

## 2020-10-15 MED ORDER — FENTANYL CITRATE (PF) 100 MCG/2ML IJ SOLN: 25.0000 ug | INTRAMUSCULAR | Status: DC | PRN

## 2020-10-15 MED ORDER — PANTOPRAZOLE SODIUM 40 MG PO TBEC
40.0000 mg | DELAYED_RELEASE_TABLET | Freq: Every day | ORAL | Status: DC
  Administered 2020-10-16 – 2020-10-19 (×4): 40 mg via ORAL
  Filled 2020-10-15 (×4): qty 1

## 2020-10-15 MED ORDER — PHENYLEPHRINE 40 MCG/ML (10ML) SYRINGE FOR IV PUSH (FOR BLOOD PRESSURE SUPPORT)
PREFILLED_SYRINGE | INTRAVENOUS | Status: DC | PRN
  Administered 2020-10-15: 80 ug via INTRAVENOUS

## 2020-10-15 MED ORDER — TRANEXAMIC ACID 1000 MG/10ML IV SOLN
INTRAVENOUS | Status: DC | PRN
  Administered 2020-10-15: 1000 mg via INTRAVENOUS

## 2020-10-15 MED ORDER — ACETAMINOPHEN 325 MG PO TABS
650.0000 mg | ORAL_TABLET | Freq: Four times a day (QID) | ORAL | Status: DC | PRN
  Administered 2020-10-15 – 2020-10-16 (×2): 650 mg via ORAL
  Filled 2020-10-15 (×3): qty 2

## 2020-10-15 MED ORDER — PROPOFOL 10 MG/ML IV BOLUS
INTRAVENOUS | Status: DC | PRN
  Administered 2020-10-15: 20 mg via INTRAVENOUS

## 2020-10-15 MED ORDER — PHENYLEPHRINE HCL-NACL 20-0.9 MG/250ML-% IV SOLN
INTRAVENOUS | Status: DC | PRN
  Administered 2020-10-15: 25 ug/min via INTRAVENOUS

## 2020-10-15 MED ORDER — HYDRALAZINE HCL 20 MG/ML IJ SOLN
5.0000 mg | Freq: Four times a day (QID) | INTRAMUSCULAR | Status: DC | PRN
  Administered 2020-10-15: 5 mg via INTRAVENOUS
  Filled 2020-10-15: qty 1

## 2020-10-15 MED ORDER — ACETAMINOPHEN 500 MG PO TABS
ORAL_TABLET | ORAL | Status: AC
  Filled 2020-10-15: qty 2

## 2020-10-15 MED ORDER — HYDROMORPHONE HCL 1 MG/ML IJ SOLN
1.0000 mg | INTRAMUSCULAR | Status: DC | PRN
Start: 2020-10-15 — End: 2020-10-15
  Administered 2020-10-15: 1 mg via INTRAVENOUS
  Filled 2020-10-15: qty 1

## 2020-10-15 MED ORDER — MEMANTINE HCL 10 MG PO TABS
10.0000 mg | ORAL_TABLET | Freq: Two times a day (BID) | ORAL | Status: DC
  Administered 2020-10-15 – 2020-10-19 (×8): 10 mg via ORAL
  Filled 2020-10-15 (×8): qty 1

## 2020-10-15 MED ORDER — FENTANYL CITRATE (PF) 250 MCG/5ML IJ SOLN
INTRAMUSCULAR | Status: AC
  Filled 2020-10-15: qty 5

## 2020-10-15 MED ORDER — ORAL CARE MOUTH RINSE: 15.0000 mL | Freq: Once | OROMUCOSAL | Status: DC

## 2020-10-15 MED ORDER — DONEPEZIL HCL 10 MG PO TABS
10.0000 mg | ORAL_TABLET | Freq: Every day | ORAL | Status: DC
  Administered 2020-10-15 – 2020-10-18 (×4): 10 mg via ORAL
  Filled 2020-10-15 (×4): qty 1

## 2020-10-15 MED ORDER — ACETAMINOPHEN 500 MG PO TABS: 1000.0000 mg | ORAL_TABLET | Freq: Once | ORAL | Status: DC

## 2020-10-15 MED ORDER — FLUOXETINE HCL 10 MG PO CAPS
10.0000 mg | ORAL_CAPSULE | Freq: Every day | ORAL | Status: DC
  Administered 2020-10-16 – 2020-10-19 (×4): 10 mg via ORAL
  Filled 2020-10-15 (×4): qty 1

## 2020-10-15 MED ORDER — ACETAMINOPHEN 10 MG/ML IV SOLN
INTRAVENOUS | Status: DC | PRN
  Administered 2020-10-15: 1000 mg via INTRAVENOUS

## 2020-10-15 MED ORDER — ONDANSETRON HCL 4 MG/2ML IJ SOLN
4.0000 mg | Freq: Four times a day (QID) | INTRAMUSCULAR | Status: DC | PRN
Start: 2020-10-15 — End: 2020-10-19

## 2020-10-15 MED ORDER — MELATONIN 3 MG PO TABS: 3.0000 mg | ORAL_TABLET | Freq: Every evening | ORAL | Status: DC | PRN

## 2020-10-15 MED ORDER — CEFAZOLIN SODIUM-DEXTROSE 2-3 GM-%(50ML) IV SOLR
INTRAVENOUS | Status: DC | PRN
  Administered 2020-10-15: 2 g via INTRAVENOUS

## 2020-10-15 MED ORDER — ACETAMINOPHEN 10 MG/ML IV SOLN
INTRAVENOUS | Status: AC
  Filled 2020-10-15: qty 100

## 2020-10-15 MED ORDER — LACTATED RINGERS IV SOLN: INTRAVENOUS | Status: AC

## 2020-10-15 MED ORDER — CHLORHEXIDINE GLUCONATE 0.12 % MT SOLN: 15.0000 mL | Freq: Once | OROMUCOSAL | Status: DC

## 2020-10-15 MED ORDER — CARBOXYMETHYLCELLULOSE SODIUM 0.5 % OP SOLN: 1.0000 [drp] | Freq: Three times a day (TID) | OPHTHALMIC | Status: DC | PRN

## 2020-10-15 MED ORDER — CARBIDOPA-LEVODOPA ER 25-100 MG PO TBCR
1.0000 | EXTENDED_RELEASE_TABLET | Freq: Every day | ORAL | Status: DC
  Administered 2020-10-15 – 2020-10-16 (×2): 1 via ORAL
  Filled 2020-10-15 (×3): qty 1

## 2020-10-15 MED ORDER — OXYCODONE HCL 5 MG PO TABS
5.0000 mg | ORAL_TABLET | Freq: Four times a day (QID) | ORAL | Status: DC | PRN
  Filled 2020-10-15: qty 1

## 2020-10-15 MED ORDER — ALFUZOSIN HCL ER 10 MG PO TB24
10.0000 mg | ORAL_TABLET | Freq: Two times a day (BID) | ORAL | Status: DC
  Administered 2020-10-15 – 2020-10-19 (×8): 10 mg via ORAL
  Filled 2020-10-15 (×9): qty 1

## 2020-10-15 MED ORDER — 0.9 % SODIUM CHLORIDE (POUR BTL) OPTIME
TOPICAL | Status: DC | PRN
  Administered 2020-10-15: 1000 mL

## 2020-10-15 MED ORDER — FENTANYL CITRATE PF 50 MCG/ML IJ SOSY
25.0000 ug | PREFILLED_SYRINGE | INTRAMUSCULAR | Status: DC | PRN
  Administered 2020-10-15: 25 ug via INTRAVENOUS
  Filled 2020-10-15: qty 1

## 2020-10-15 MED ORDER — HYDROMORPHONE HCL 1 MG/ML IJ SOLN
0.5000 mg | INTRAMUSCULAR | Status: DC | PRN
  Administered 2020-10-15 (×2): 0.5 mg via INTRAVENOUS
  Filled 2020-10-15: qty 1
  Filled 2020-10-15: qty 0.5

## 2020-10-15 MED ORDER — ONDANSETRON HCL 4 MG/2ML IJ SOLN
INTRAMUSCULAR | Status: DC | PRN
  Administered 2020-10-15: 4 mg via INTRAVENOUS

## 2020-10-15 MED ORDER — CLONAZEPAM 0.5 MG PO TABS
0.5000 mg | ORAL_TABLET | Freq: Every day | ORAL | Status: DC
  Administered 2020-10-15 – 2020-10-18 (×4): 0.5 mg via ORAL
  Filled 2020-10-15 (×4): qty 1

## 2020-10-15 MED ORDER — PHENYLEPHRINE HCL-NACL 20-0.9 MG/250ML-% IV SOLN: INTRAVENOUS | Status: DC | PRN

## 2020-10-15 MED ORDER — FINASTERIDE 5 MG PO TABS
5.0000 mg | ORAL_TABLET | Freq: Every day | ORAL | Status: DC
  Administered 2020-10-16 – 2020-10-19 (×4): 5 mg via ORAL
  Filled 2020-10-15 (×4): qty 1

## 2020-10-15 MED ORDER — INFLUENZA VAC A&B SA ADJ QUAD 0.5 ML IM PRSY
0.5000 mL | PREFILLED_SYRINGE | INTRAMUSCULAR | Status: AC | PRN
  Administered 2020-10-19: 0.5 mL via INTRAMUSCULAR
  Filled 2020-10-15 (×2): qty 0.5

## 2020-10-15 MED ORDER — SODIUM CHLORIDE 0.9 % IR SOLN
Status: DC | PRN
  Administered 2020-10-15: 3000 mL

## 2020-10-15 MED ORDER — DEXAMETHASONE SODIUM PHOSPHATE 10 MG/ML IJ SOLN
INTRAMUSCULAR | Status: DC | PRN
  Administered 2020-10-15: 5 mg via INTRAVENOUS

## 2020-10-15 MED ORDER — CHLORHEXIDINE GLUCONATE 0.12 % MT SOLN
OROMUCOSAL | Status: AC
  Filled 2020-10-15: qty 15

## 2020-10-15 MED ORDER — NALOXONE HCL 0.4 MG/ML IJ SOLN: 0.4000 mg | INTRAMUSCULAR | Status: DC | PRN

## 2020-10-15 SURGICAL SUPPLY — 68 items
BAG COUNTER SPONGE SURGICOUNT (BAG) ×2 IMPLANT
BAG SPNG CNTER NS LX DISP (BAG) ×1
BALL HIP DEPUY 53 (Hips) IMPLANT
BLADE SAW SGTL 18X1.27X75 (BLADE) ×2 IMPLANT
CEMENT BONE SIMPLEX SPEEDSET (Cement) ×2 IMPLANT
CEMENT RESTRICTOR DEPUY SZ 4 (Cement) ×1 IMPLANT
COVER BACK TABLE 80X110 HD (DRAPES) ×1 IMPLANT
COVER PERINEAL POST (MISCELLANEOUS) ×1 IMPLANT
COVER SURGICAL LIGHT HANDLE (MISCELLANEOUS) ×2 IMPLANT
DRAPE C-ARM 42X72 X-RAY (DRAPES) ×1 IMPLANT
DRAPE HALF SHEET 40X57 (DRAPES) ×1 IMPLANT
DRAPE U-SHAPE 47X51 STRL (DRAPES) ×4 IMPLANT
DRESSING AQUACEL AG SP 3.5X10 (GAUZE/BANDAGES/DRESSINGS) IMPLANT
DRSG AQUACEL AG ADV 3.5X10 (GAUZE/BANDAGES/DRESSINGS) IMPLANT
DRSG AQUACEL AG ADV 3.5X14 (GAUZE/BANDAGES/DRESSINGS) IMPLANT
DRSG AQUACEL AG SP 3.5X10 (GAUZE/BANDAGES/DRESSINGS) ×2
ELECT BLADE 4.0 EZ CLEAN MEGAD (MISCELLANEOUS) ×2
ELECT REM PT RETURN 9FT ADLT (ELECTROSURGICAL) ×2
ELECTRODE BLDE 4.0 EZ CLN MEGD (MISCELLANEOUS) IMPLANT
ELECTRODE REM PT RTRN 9FT ADLT (ELECTROSURGICAL) ×1 IMPLANT
GLOVE SRG 8 PF TXTR STRL LF DI (GLOVE) ×2 IMPLANT
GLOVE SURG UNDER POLY LF SZ6 (GLOVE) ×2 IMPLANT
GLOVE SURG UNDER POLY LF SZ6.5 (GLOVE) ×1 IMPLANT
GLOVE SURG UNDER POLY LF SZ7.5 (GLOVE) ×5 IMPLANT
GLOVE SURG UNDER POLY LF SZ8 (GLOVE) ×6
GOWN STRL REUS W/ TWL LRG LVL3 (GOWN DISPOSABLE) IMPLANT
GOWN STRL REUS W/ TWL XL LVL3 (GOWN DISPOSABLE) ×2 IMPLANT
GOWN STRL REUS W/TWL LRG LVL3 (GOWN DISPOSABLE) ×4
GOWN STRL REUS W/TWL XL LVL3 (GOWN DISPOSABLE) ×4
HANDPIECE INTERPULSE COAX TIP (DISPOSABLE) ×2
HIP BALL DEPUY 53 (Hips) ×2 IMPLANT
HOOD PEEL AWAY FACE SHEILD DIS (HOOD) IMPLANT
KIT BASIN OR (CUSTOM PROCEDURE TRAY) ×2 IMPLANT
KIT TURNOVER KIT B (KITS) ×2 IMPLANT
MANIFOLD NEPTUNE II (INSTRUMENTS) ×2 IMPLANT
NEEDLE 22X1 1/2 (OR ONLY) (NEEDLE) ×1 IMPLANT
NS IRRIG 1000ML POUR BTL (IV SOLUTION) ×2 IMPLANT
PACK TOTAL JOINT (CUSTOM PROCEDURE TRAY) ×2 IMPLANT
PAD ARMBOARD 7.5X6 YLW CONV (MISCELLANEOUS) ×6 IMPLANT
PASSER SUT SWANSON 36MM LOOP (INSTRUMENTS) ×1 IMPLANT
PRESSURIZER FEMORAL UNIV (MISCELLANEOUS) ×1 IMPLANT
RESTRICTOR CEMENT PE SZ 2 (Cement) ×1 IMPLANT
SET HNDPC FAN SPRY TIP SCT (DISPOSABLE) IMPLANT
SPACER DEPUY (Hips) ×1 IMPLANT
STAPLER VISISTAT 35W (STAPLE) ×1 IMPLANT
STEM DIST FEM CENTRALIZR 11 (Hips) ×1 IMPLANT
STEM FEMORAL SZ 6MM STD ACTIS (Stem) ×1 IMPLANT
STEM SUMMIT CEMENT BASIC SZ4 (Hips) ×1 IMPLANT
STEM SUMMIT CEMENTED BASIC SZ6 (Joint) IMPLANT
SUMMIT CEMENTED BASIC SZ 6 (Joint) ×2 IMPLANT
SUT ETHIBOND 2 V 37 (SUTURE) ×3 IMPLANT
SUT VIC AB 0 CT1 27 (SUTURE) ×16
SUT VIC AB 0 CT1 27XBRD ANBCTR (SUTURE) IMPLANT
SUT VIC AB 0 CTX 36 (SUTURE)
SUT VIC AB 0 CTX36XBRD ANTBCTR (SUTURE) ×1 IMPLANT
SUT VIC AB 1 CTX 36 (SUTURE)
SUT VIC AB 1 CTX36XBRD ANBCTR (SUTURE) ×1 IMPLANT
SUT VIC AB 2-0 CT1 27 (SUTURE) ×8
SUT VIC AB 2-0 CT1 TAPERPNT 27 (SUTURE) IMPLANT
SUT VIC AB 2-0 CTB1 (SUTURE) ×1 IMPLANT
SUT VIC AB 3-0 CT1 27 (SUTURE)
SUT VIC AB 3-0 CT1 TAPERPNT 27 (SUTURE) ×1 IMPLANT
SYR CONTROL 10ML LL (SYRINGE) ×2 IMPLANT
TOWEL GREEN STERILE (TOWEL DISPOSABLE) ×2 IMPLANT
TOWEL GREEN STERILE FF (TOWEL DISPOSABLE) ×2 IMPLANT
TOWER CARTRIDGE SMART MIX (DISPOSABLE) ×1 IMPLANT
TRAY CATH 16FR W/PLASTIC CATH (SET/KITS/TRAYS/PACK) IMPLANT
WATER STERILE IRR 1000ML POUR (IV SOLUTION) ×5 IMPLANT

## 2020-10-15 NOTE — Brief Op Note (Signed)
   Brief Op Note  Date of Surgery: 10/15/2020  Preoperative Diagnosis: Right Hip Fracture  Postoperative Diagnosis: same  Procedure: Procedure(s): ANTERIOR APPROACH HIP ARTHROPLASTY  Implants: Implant Name Type Inv. Item Serial No. Manufacturer Lot No. LRB No. Used Action  SPACER DEPUY - [id number] Hips SPACER DEPUY  DEPUY ORTHOPAEDICS [id number] Right 1 Implanted  HIP BALL DEPUY 53 - [id number] Hips HIP BALL DEPUY 53  DEPUY ORTHOPAEDICS [id number] Right 1 Implanted  IMPLANT CENTRALIZER DEPUY - [id number] Hips IMPLANT CENTRALIZER DEPUY  DEPUY ORTHOPAEDICS [id number] Right 1 Implanted  CEMENT BONE SIMPLEX SPEEDSET - [id number] Cement CEMENT BONE SIMPLEX SPEEDSET  STRYKER ORTHOPEDICS [id number] Right 2 Implanted  STEM SUMMIT CEMENT BASIC SZ4 - [id number] Hips STEM SUMMIT CEMENT BASIC SZ4  DEPUY ORTHOPAEDICS [id number] Right 1 Implanted  RESTRICTOR CEMENT PE SZ 2 - [id number] Cement RESTRICTOR CEMENT PE SZ 2  DEPUY ORTHOPAEDICS [id number] Right 1 Implanted    Surgeons: Surgeon(s): Vanetta Mulders, MD  Anesthesia: General    Estimated Blood Loss: See anesthesia record  Complications: None  Condition to PACU: Stable  Yevonne Pax, MD 10/15/2020 3:47 PM

## 2020-10-15 NOTE — Anesthesia Procedure Notes (Signed)
Spinal  Patient location during procedure: OR End time: 10/15/2020 1:10 PM Reason for block: surgical anesthesia Staffing Performed: anesthesiologist  Anesthesiologist: Annye Asa, MD Preanesthetic Checklist Completed: patient identified, IV checked, site marked, risks and benefits discussed, surgical consent, monitors and equipment checked, pre-op evaluation and timeout performed Spinal Block Patient position: sitting Prep: DuraPrep and site prepped and draped Patient monitoring: blood pressure, continuous pulse ox, cardiac monitor and heart rate Approach: midline Location: L3-4 Injection technique: single-shot Needle Needle type: Pencan and Introducer  Needle gauge: 24 G Needle length: 9 cm Assessment Events: CSF return Additional Notes Pt identified in Operating room.  Monitors applied. Working IV access confirmed. Sterile prep, drape lumbar spine.  1% lido local L 3,4.  #24ga Pencan into clear CSF L 3,4.  15mg  0.75% Bupivacaine with dextrose injected with asp CSF beginning and end of injection.  Patient asymptomatic, VSS, no heme aspirated, tolerated well.  Jenita Seashore, MD

## 2020-10-15 NOTE — Progress Notes (Signed)
I was called last night about patient's hip fracture and reviewed xrays. Patient has since been admitted to the medicine service. I have discussed patient with Dr. Sammuel Hines, and he will be treating the patient operatively later today. Patient is currently NPO. With regards to medical clearance, I have spoken with Dr. Venetia Constable, the patient's current attending, and made this request and he was agreeable.

## 2020-10-15 NOTE — Progress Notes (Addendum)
[facility] PROGRESS NOTE    Progress Note  Henry Adams  RXV:[id number] DOB: [birth year] DOA: 10/14/2020 PCP: [name], MD     Brief Narrative:   Henry Adams is an 80 y.o. male past medical history significant for Parkinson's disease, peripheral neuropathy with history of B12 deficiency chronic anxiety restless leg syndrome on Klonopin, chronic constipation dementia recurrent falls presents to the ED after an unwitnessed fall complaining of right hip pain work-up in the ED revealed displaced right femoral fracture orthopedic surgery was consulted.    Assessment/Plan:   Acute displaced right femoral neck fracture due to mechanical fall: Orthopedic surgery has been consulted. Continue pain control. Patient is currently n.p.o. orthopedic surgery to proceed with surgery this morning.  Preoperative risk assessment: He relates no no angina, dyspnea, syncope, and palpitations, No history of heart disease including ischemic, valvular, or myopathic disease, diabetes,  and cerebrovascular or peripheral artery disease.  Cardiac functional status :  1 METS  Preoperative ECG reviewed and shows sinus rhythm left axis deviation nonspecific T wave changes intervals are preserved. Using the [facility] preoperative cardiac risk calculator, the patient's estimated risk probability for perioperative MI or cardiac arrest is 0.6. Risk and benefits were explained to the patient and the family and they would like to proceed with surgical intervention.  Mild leukocytosis:  Likely due to reactive acute fracture.  Has remained afebrile monitor fever curve check CBC in the morning.  Essential hypertension uncontrolled: Blood pressure is elevated likely due to pain. She was restarted on her home antihypertensive regimen.  Parkinson's disease: Continue current home regimen.  Chronic anxiety/depression/restless leg syndrome: Continue Prozac and benzodiazepines. She is in significant risk of  aspiration and delirium. Her QTC is preserved. Will use Haldol IV as needed melatonin at bedtime.  BPH: Continue Proscar and alfuzosin.  Chronic constipation: Continue MiraLAX    DVT prophylaxis: lovenox Family Communication:none Status is: Inpatient  Remains inpatient appropriate because:Hemodynamically unstable  Dispo:  Patient From: [place]  Planned Disposition: [place]  Medically stable for discharge: No   Code Status:     Code Status Orders  (From admission, onward)           Start     Ordered   10/15/20 0228  Full code  Continuous        10/15/20 0228           Code Status History     Date Active Date Inactive Code Status Order ID Comments User Context   06/16/2018 2333 06/19/2018 2206 Full Code [id number]  Ivor Costa, MD ED         IV Access:   Peripheral IV   Procedures and diagnostic studies:   DG Chest 1 View  Result Date: 10/15/2020 CLINICAL DATA:  Unwitnessed fall from standing. EXAM: CHEST  1 VIEW COMPARISON:  02/22/2020 FINDINGS: Patient is rotated. Lung volumes are low. Prominent heart size likely accentuated by technique. Bandlike opacity in the right suprahilar region favors atelectasis. Vascular congestion versus bronchovascular crowding. No visualized pneumothorax or confluent airspace disease. No pleural fluid. No acute osseous abnormalities are seen. IMPRESSION: Rotated exam with low lung volumes and right suprahilar atelectasis. Vascular congestion versus bronchovascular crowding. Electronically Signed   By: Keith Rake M.D.   On: 10/15/2020 00:43   CT HEAD WO CONTRAST (5MM)  Result Date: 10/15/2020 CLINICAL DATA:  Head trauma, minor (Age >= 65y) Unwitnessed fall. EXAM: CT HEAD WITHOUT CONTRAST TECHNIQUE: Contiguous axial images were obtained from  the base of the skull through the vertex without intravenous contrast. COMPARISON:  02/16/2020, brain MRI 02/10/2017 FINDINGS: Brain: Stable degree of  atrophy and chronic small vessel ischemia. No intracranial hemorrhage, mass effect, or midline shift. The previous 1 cm meningioma involving the posterior aspect of the foramen magnum is grossly stable from prior MRI, though not well assessed by CT, series 3, image 3. There is no associated mass effect. No hydrocephalus. The basilar cisterns are patent. No evidence of territorial infarct or acute ischemia. No extra-axial or intracranial fluid collection. Vascular: Atherosclerosis of skullbase vasculature without hyperdense vessel or abnormal calcification. Skull: No fracture or focal lesion. Sinuses/Orbits: Mucous retention cyst in the left maxillary sinus. No acute findings. Mastoid air cells are clear. Other: None. IMPRESSION: 1. No acute intracranial abnormality. No skull fracture. 2. Stable atrophy and chronic small vessel ischemia. 3. Unchanged small meningioma at the foramen magnum. Electronically Signed   By: Keith Rake M.D.   On: 10/15/2020 00:39   CT Cervical Spine Wo Contrast  Result Date: 10/15/2020 CLINICAL DATA:  Neck trauma (Age >= 65y) Unwitnessed fall from standing. EXAM: CT CERVICAL SPINE WITHOUT CONTRAST TECHNIQUE: Multidetector CT imaging of the cervical spine was performed without intravenous contrast. Multiplanar CT image reconstructions were also generated. COMPARISON:  None. FINDINGS: Alignment: Trace degenerative type anterolisthesis of C7 on T1. No traumatic subluxation. Skull base and vertebrae: No acute fracture. Vertebral body heights are maintained. The dens and skull base are intact. Soft tissues and spinal canal: No prevertebral fluid or swelling. No visible canal hematoma. Disc levels: Diffuse degenerative disc disease, advanced at C5-C6 and C6-C7. There is prominent multilevel facet hypertrophy Upper chest: No acute or unexpected findings. Other: None. IMPRESSION: 1. No acute fracture or subluxation of the cervical spine. 2. Multilevel degenerative disc disease and facet  hypertrophy. Electronically Signed   By: Keith Rake M.D.   On: 10/15/2020 00:42   DG Hip Unilat W or Wo Pelvis 2-3 Views Right  Result Date: 10/15/2020 CLINICAL DATA:  Unwitnessed fall from standing.  Right hip pain. EXAM: DG HIP (WITH OR WITHOUT PELVIS) 2-3V RIGHT COMPARISON:  None. FINDINGS: Displaced right femoral neck fracture. Mild proximal migration of the femoral shaft. Femoral head remains seated. The bones are diffusely under mineralized. No pubic rami fracture. No pubic symphyseal or sacroiliac joint diastasis. IMPRESSION: Displaced right femoral neck fracture. Electronically Signed   By: Keith Rake M.D.   On: 10/15/2020 00:44     Medical Consultants:   None.   Subjective:    Henry Adams not in pain  Objective:    Vitals:   10/15/20 0045 10/15/20 0100 10/15/20 0615 10/15/20 0630  BP: (!) 174/95 (!) 164/102 (!) 171/101 (!) 174/102  Pulse: 67 64  80  Resp: 18 16  18   Temp:    98.1 F (36.7 C)  TempSrc:      SpO2: 90% 90%  95%  Weight:      Height:       SpO2: 95 % O2 Flow Rate (L/min): 2 L/min  No intake or output data in the 24 hours ending 10/15/20 0743 Filed Weights   10/14/20 2316  Weight: 77.1 kg    Exam: General exam: In no acute distress. Respiratory system: Good air movement and clear to auscultation. Cardiovascular system: S1 & S2 heard, RRR. No JVD. Gastrointestinal system: Abdomen is nondistended, soft and nontender.  Extremities: No pedal edema. Skin: No rashes, lesions or ulcers Psychiatry: Judgement and insight appear normal. Mood &  affect appropriate.    Data Reviewed:    Labs: Basic Metabolic Panel: Recent Labs  Lab 10/14/20 2327 10/15/20 0349  NA 136 136  K 4.1 3.9  CL 106 105  CO2 23 23  GLUCOSE 116* 115*  BUN 39* 33*  CREATININE 1.94* 1.69*  CALCIUM 8.4* 8.6*  MG  --  2.1  PHOS  --  3.0   GFR Estimated Creatinine Clearance: 35.9 mL/min (A) (by C-G formula based on SCr of 1.69 mg/dL (H)). Liver Function  Tests: Recent Labs  Lab 10/14/20 2327  AST 23  ALT 7  ALKPHOS 85  BILITOT 0.8  PROT 6.9  ALBUMIN 3.3*   No results for input(s): LIPASE, AMYLASE in the last 168 hours. No results for input(s): AMMONIA in the last 168 hours. Coagulation profile No results for input(s): INR, PROTIME in the last 168 hours. COVID-19 Labs  No results for input(s): DDIMER, FERRITIN, LDH, CRP in the last 72 hours.  Lab Results  Component Value Date   SARSCOV2NAA NEGATIVE 10/14/2020   SARSCOV2NAA NEGATIVE 02/16/2020   [place] NEGATIVE 06/16/2018    CBC: Recent Labs  Lab 10/14/20 2327 10/15/20 0349  WBC 6.0 11.0*  NEUTROABS 3.7  --   HGB 11.6* 12.3*  HCT 36.4* 37.2*  MCV 96.8 95.6  PLT 141* 153   Cardiac Enzymes: No results for input(s): CKTOTAL, CKMB, CKMBINDEX, TROPONINI in the last 168 hours. BNP (last 3 results) No results for input(s): PROBNP in the last 8760 hours. CBG: No results for input(s): GLUCAP in the last 168 hours. D-Dimer: No results for input(s): DDIMER in the last 72 hours. Hgb A1c: No results for input(s): HGBA1C in the last 72 hours. Lipid Profile: No results for input(s): CHOL, HDL, LDLCALC, TRIG, CHOLHDL, LDLDIRECT in the last 72 hours. Thyroid function studies: No results for input(s): TSH, T4TOTAL, T3FREE, THYROIDAB in the last 72 hours.  Invalid input(s): FREET3 Anemia work up: No results for input(s): VITAMINB12, FOLATE, FERRITIN, TIBC, IRON, RETICCTPCT in the last 72 hours. Sepsis Labs: Recent Labs  Lab 10/14/20 2327 10/15/20 0349  WBC 6.0 11.0*   Microbiology Recent Results (from the past 240 hour(s))  Resp Panel by RT-PCR (Flu A&B, Covid) Nasopharyngeal Swab     Status: None   Collection Time: 10/14/20 11:28 PM   Specimen: Nasopharyngeal Swab; Nasopharyngeal(NP) swabs in vial transport medium  Result Value Ref Range Status   SARS Coronavirus 2 by RT PCR NEGATIVE NEGATIVE Final    Comment: (NOTE) SARS-CoV-2 target nucleic acids are NOT  DETECTED.  The SARS-CoV-2 RNA is generally detectable in upper respiratory specimens during the acute phase of infection. The lowest concentration of SARS-CoV-2 viral copies this assay can detect is 138 copies/mL. A negative result does not preclude SARS-Cov-2 infection and should not be used as the sole basis for treatment or other patient management decisions. A negative result may occur with  improper specimen collection/handling, submission of specimen other than nasopharyngeal swab, presence of viral mutation(s) within the areas targeted by this assay, and inadequate number of viral copies(<138 copies/mL). A negative result must be combined with clinical observations, patient history, and epidemiological information. The expected result is Negative.  Fact Sheet for Patients:  EntrepreneurPulse.com.au  Fact Sheet for Healthcare Providers:  IncredibleEmployment.be  This test is no t yet approved or cleared by the [place] FDA and  has been authorized for detection and/or diagnosis of SARS-CoV-2 by FDA under an Emergency Use Authorization (EUA). This EUA will remain  in effect (meaning this  test can be used) for the duration of the COVID-19 declaration under Section 564(b)(1) of the Act, 21 U.S.C.section 360bbb-3(b)(1), unless the authorization is terminated  or revoked sooner.       Influenza A by PCR NEGATIVE NEGATIVE Final   Influenza B by PCR NEGATIVE NEGATIVE Final    Comment: (NOTE) The Xpert Xpress SARS-CoV-2/FLU/RSV plus assay is intended as an aid in the diagnosis of influenza from Nasopharyngeal swab specimens and should not be used as a sole basis for treatment. Nasal washings and aspirates are unacceptable for Xpert Xpress SARS-CoV-2/FLU/RSV testing.  Fact Sheet for Patients: EntrepreneurPulse.com.au  Fact Sheet for Healthcare Providers: IncredibleEmployment.be  This test is not yet  approved or cleared by the [place] FDA and has been authorized for detection and/or diagnosis of SARS-CoV-2 by FDA under an Emergency Use Authorization (EUA). This EUA will remain in effect (meaning this test can be used) for the duration of the COVID-19 declaration under Section 564(b)(1) of the Act, 21 U.S.C. section 360bbb-3(b)(1), unless the authorization is terminated or revoked.  Performed at [facility], [place] [address] [place], [place] [address]      Medications:    alfuzosin  10 mg Oral BID   pantoprazole  40 mg Oral Daily   Continuous Infusions:  lactated ringers 50 mL/hr at 10/15/20 0410      LOS: 0 days   Charlynne Cousins  [facility]  10/15/2020, 7:43 AM

## 2020-10-15 NOTE — H&P (Signed)
History and Physical  Henry Adams GYJ:[id number] DOB: Oct 30, 1940 DOA: 10/14/2020  Referring physician: Dr. Laverta Baltimore, [place]. PCP: [name], MD  Outpatient Specialists: Neurology. Patient coming from: SNF.  Chief Complaint: Fall.  HPI: Henry Adams is a 80 y.o. male with medical history significant for Parkinson's disease, peripheral neuropathy with history of B12 deficiency, chronic anxiety/depression, restless leg syndrome on Klonopin, chronic constipation, dementia, recurrent falls, who presented to [facility] ED from SNF via EMS after an unwitnessed fall.  Subsequently had severe right hip pain.  EMS was activated.  He was brought to the ED for further evaluation and management.  Patient is in the room accompanied by his daughter who provides most of the history.  Patient has a history of frequent falls.  Work-up in the ED revealed a displaced right femoral neck fracture.  Orthopedic surgery was consulted by EDP with plan for surgical repair.  Admitted by Hospitalist service.  ED Course: Temperature 97.9.  BP 165/100, pulse 77, respiration rate 16, O2 saturation 89% on room air.  Lab studies significant for hemoglobin 11.6 from 11.9, roughly 8 months ago.  Creatinine 1.94, BUN 39, GFR 34.  Review of Systems: Review of systems as noted in the HPI. All other systems reviewed and are negative.   Past Medical History:  Diagnosis Date   Allergy    perennial   Anxiety    Arthritis    Cholelithiasis    Chronic idiopathic constipation    CKD (chronic kidney disease)    Depression    Diverticulosis    Enlarged prostate    Gallstones    GERD (gastroesophageal reflux disease)    HLD (hyperlipidemia)    Hx of cardiovascular stress test    ETT-Myoview 6/14: probable low risk study, inf defect with normal inf wall motion-cannot r/o ischemia; prominent gut uptake adjacent to inf wall on rest images-poss defect is atten, EF 63%   Hypertension    Melanoma ([place])    Parkinsonism ([place])    Polycystic  kidney disease    Past Surgical History:  Procedure Laterality Date   COLONOSCOPY  2011   diverticulosis   HERNIA REPAIR     umbilical   KNEE SURGERY Left    medialization laryngoplasty  10/15/2013   Dr Joya Gaskins, [facility]   TONSILLECTOMY AND ADENOIDECTOMY      Social History:  reports that he has never smoked. He has never used smokeless tobacco. He reports that he does not currently use alcohol. He reports that he does not use drugs.   Allergies  Allergen Reactions   Iodine     rash   Oxycodone     Mental status changes & nausea   Pollen Extract Itching   Tamiflu [Oseltamivir Phosphate] Other (See Comments)    hallucinations   Tamsulosin     Other reaction(s): Other (See Comments) Unknown    Doxazosin Rash    agitation   Etodolac Rash    unknown   Tape Rash    Certain "Band-Aids"    Family History  Problem Relation Age of Onset   Hypertension Father    Cancer Brother        mouth   Cancer Sister        breast   Heart disease Brother        heart transplant   Cancer Daughter        breast   Stroke Sister        >65   Skin cancer Brother  squamous cell   Colon cancer Neg Hx       Prior to Admission medications   Medication Sig Start Date End Date Taking? Authorizing Provider  acetaminophen (TYLENOL) 500 MG tablet 500 mg prn for back, knee, shoulder or head pain. May be given up to every 6 hours. 02/05/19   [name], MD  alfuzosin (UROXATRAL) 10 MG 24 hr tablet Take 1 tablet (10 mg total) by mouth 2 (two) times daily. 10 mg at 7 am and 7 pm daily. 04/08/19   [name], MD  Carbidopa-Levodopa ER (SINEMET CR) 25-100 MG tablet controlled release TAKE ONE TABLET AT BEDTIME. 11/09/19   Tat, Eustace Quail, DO  Carbidopa-Levodopa ER (SINEMET CR) 25-100 MG tablet controlled release Take 1 tablet by mouth. Take 1 tab at 6am/10am/2pm/ 1.5 tab at 6pm    [provider]  carboxymethylcellulose (REFRESH PLUS) 0.5 % SOLN 1 drop 3 (three) times daily as  needed.    [provider]  cefUROXime (CEFTIN) 500 MG tablet TAKE 1 TABLET BY MOUTH TWICE DAILY WITH MEALS FOR 5 DAYS 02/17/20 02/16/21  Deno Etienne, DO  clonazePAM (KLONOPIN) 0.5 MG tablet Take 1 tablet (0.5 mg total) by mouth at bedtime. 02/05/20   [name], MD  donepezil (ARICEPT) 10 MG tablet Take 1 tablet (10 mg total) by mouth at bedtime. 04/09/19   Cottle, Billey Co., MD  fexofenadine (ALLEGRA) 180 MG tablet Take 1 tablet (180 mg total) by mouth daily. 180mg  QD 01/07/20   [name], MD  finasteride (PROSCAR) 5 MG tablet Take 1 tablet (5 mg total) by mouth daily. 5mg  QD 02/05/20   [name], MD  FLUoxetine (PROZAC) 10 MG capsule Take 1 capsule (10 mg total) by mouth daily. 07/10/19   Cottle, Billey Co., MD  meclizine (ANTIVERT) 12.5 MG tablet Take 12.5 mg by mouth 3 (three) times daily as needed for dizziness.    [provider]  memantine (NAMENDA) 10 MG tablet Take 10 mg by mouth 2 (two) times daily. 11/15/19   [provider]  Menthol-Zinc Oxide (CALMOSEPTINE) 0.44-20.6 % OINT Apply 1 Act topically daily. 10/21/19   [name], MD  omeprazole (PRILOSEC) 40 MG capsule TAKE (1) CAPSULE DAILY. Patient taking differently: Take 40 mg by mouth daily. 03/11/19   [name], MD  Polyethylene Glycol 3350 (MIRALAX PO) Take by mouth.    [provider]  Probiotic Product ([place]) CAPS Take 1 capsule by mouth daily. 01/07/20   [name], MD  vitamin B-12 (CYANOCOBALAMIN) 1000 MCG tablet Take 1 tablet (1,000 mcg total) by mouth daily. 01/07/20   [name], MD    Physical Exam: BP (!) 164/102   Pulse 64   Temp 97.9 F (36.6 C) (Oral)   Resp 16   Ht 5\' 11"  (1.803 m)   Wt 77.1 kg   SpO2 90%   BMI 23.71 kg/m   General: 80 y.o. year-old male well developed well nourished in no acute distress.  Alert and pleasantly confused.  Appears uncomfortable due to his hip pain. Cardiovascular: Regular rate and rhythm with  no rubs or gallops.  No thyromegaly or JVD noted.  No lower extremity edema. 2/4 pulses in all 4 extremities. Respiratory: Clear to auscultation with no wheezes or rales. Good inspiratory effort. Abdomen: Soft nontender nondistended with normal bowel sounds x4 quadrants. Muskuloskeletal: No cyanosis, clubbing or edema noted bilaterally Neuro: CN II-XII intact, strength, sensation, reflexes Skin:  No ulcerative lesions noted or rashes Psychiatry: Judgement and insight appear altered. Mood is appropriate for condition and setting          Labs on Admission:  Basic Metabolic Panel: Recent Labs  Lab 10/14/20 2327  NA 136  K 4.1  CL 106  CO2 23  GLUCOSE 116*  BUN 39*  CREATININE 1.94*  CALCIUM 8.4*   Liver Function Tests: Recent Labs  Lab 10/14/20 2327  AST 23  ALT 7  ALKPHOS 85  BILITOT 0.8  PROT 6.9  ALBUMIN 3.3*   No results for input(s): LIPASE, AMYLASE in the last 168 hours. No results for input(s): AMMONIA in the last 168 hours. CBC: Recent Labs  Lab 10/14/20 2327  WBC 6.0  NEUTROABS 3.7  HGB 11.6*  HCT 36.4*  MCV 96.8  PLT 141*   Cardiac Enzymes: No results for input(s): CKTOTAL, CKMB, CKMBINDEX, TROPONINI in the last 168 hours.  BNP (last 3 results) No results for input(s): BNP in the last 8760 hours.  ProBNP (last 3 results) No results for input(s): PROBNP in the last 8760 hours.  CBG: No results for input(s): GLUCAP in the last 168 hours.  Radiological Exams on Admission: DG Chest 1 View  Result Date: 10/15/2020 CLINICAL DATA:  Unwitnessed fall from standing. EXAM: CHEST  1 VIEW COMPARISON:  02/22/2020 FINDINGS: Patient is rotated. Lung volumes are low. Prominent heart size likely accentuated by technique. Bandlike opacity in the right suprahilar region favors atelectasis. Vascular congestion versus bronchovascular crowding. No visualized pneumothorax or confluent airspace disease. No pleural fluid. No acute osseous abnormalities are seen.  IMPRESSION: Rotated exam with low lung volumes and right suprahilar atelectasis. Vascular congestion versus bronchovascular crowding. Electronically Signed   By: Keith Rake M.D.   On: 10/15/2020 00:43   CT HEAD WO CONTRAST (5MM)  Result Date: 10/15/2020 CLINICAL DATA:  Head trauma, minor (Age >= 65y) Unwitnessed fall. EXAM: CT HEAD WITHOUT CONTRAST TECHNIQUE: Contiguous axial images were obtained from the base of the skull through the vertex without intravenous contrast. COMPARISON:  02/16/2020, brain MRI 02/10/2017 FINDINGS: Brain: Stable degree of atrophy and chronic small vessel ischemia. No intracranial hemorrhage, mass effect, or midline shift. The previous 1 cm meningioma involving the posterior aspect of the foramen magnum is grossly stable from prior MRI, though not well assessed by CT, series 3, image 3. There is no associated mass effect. No hydrocephalus. The basilar cisterns are patent. No evidence of territorial infarct or acute ischemia. No extra-axial or intracranial fluid collection. Vascular: Atherosclerosis of skullbase vasculature without hyperdense vessel or abnormal calcification. Skull: No fracture or focal lesion. Sinuses/Orbits: Mucous retention cyst in the left maxillary sinus. No acute findings. Mastoid air cells are clear. Other: None. IMPRESSION: 1. No acute intracranial abnormality. No skull fracture. 2. Stable atrophy and chronic small vessel ischemia. 3. Unchanged small meningioma at the foramen magnum. Electronically Signed   By: Keith Rake M.D.   On: 10/15/2020 00:39   CT Cervical Spine Wo Contrast  Result Date: 10/15/2020 CLINICAL DATA:  Neck trauma (Age >= 65y) Unwitnessed fall from standing. EXAM: CT CERVICAL SPINE WITHOUT CONTRAST TECHNIQUE: Multidetector CT imaging of the cervical spine was performed without intravenous contrast. Multiplanar CT image reconstructions were also generated. COMPARISON:  None. FINDINGS: Alignment: Trace degenerative type  anterolisthesis of C7 on T1. No traumatic subluxation. Skull base and vertebrae: No acute fracture. Vertebral body heights are maintained. The dens and skull base are intact. Soft tissues and spinal canal: No prevertebral fluid or  swelling. No visible canal hematoma. Disc levels: Diffuse degenerative disc disease, advanced at C5-C6 and C6-C7. There is prominent multilevel facet hypertrophy Upper chest: No acute or unexpected findings. Other: None. IMPRESSION: 1. No acute fracture or subluxation of the cervical spine. 2. Multilevel degenerative disc disease and facet hypertrophy. Electronically Signed   By: Keith Rake M.D.   On: 10/15/2020 00:42   DG Hip Unilat W or Wo Pelvis 2-3 Views Right  Result Date: 10/15/2020 CLINICAL DATA:  Unwitnessed fall from standing.  Right hip pain. EXAM: DG HIP (WITH OR WITHOUT PELVIS) 2-3V RIGHT COMPARISON:  None. FINDINGS: Displaced right femoral neck fracture. Mild proximal migration of the femoral shaft. Femoral head remains seated. The bones are diffusely under mineralized. No pubic rami fracture. No pubic symphyseal or sacroiliac joint diastasis. IMPRESSION: Displaced right femoral neck fracture. Electronically Signed   By: Keith Rake M.D.   On: 10/15/2020 00:44    EKG: I independently viewed the EKG done and my findings are as followed: Sinus rhythm rate of 77.  Nonspecific ST-T changes.  QTc 451.  Assessment/Plan Present on Admission:  Right femoral fracture (HCC)  Active Problems:   Right femoral fracture (HCC)  Acute displaced right femoral neck fracture post unwitnessed fall, seen on CT scan. History of frequent falls Orthopedic surgery consulted by EDP, plan for surgical repair Pain control N.p.o. until seen by orthopedic surgery Type and screen CBC, BMP in the a.m.  Essential hypertension, uncontrolled BP is not at goal, elevated Likely contributed by pain Resume home oral antihypertensive Treat underlying condition, pain  control  Parkinson's disease, dementia Home regimen.  Chronic anxiety/depression/restless leg syndrome Resume home regimen  BPH Resume home Proscar and alfuzosin Monitor urine output  Chronic constipation Resume home bowel regimen.   DVT prophylaxis: SCDs  Code Status: Full code  Family Communication: Daughter at bedside.  Disposition Plan: Admitted to telemetry surgical  Consults called: Orthopedic surgery consulted by EDP.  Admission status: Inpatient status.  Patient will require at least 2 midnights for further evaluation and treatment of present condition.      Status is: Inpatient    Dispo:  Patient From:  [place]  Planned Disposition:  [place]  Medically stable for discharge:  No          Kayleen Memos MD [facility] Pager [phone]  If 7PM-7AM, please contact night-coverage [email or web address] Password [facility]  10/15/2020, 2:31 AM

## 2020-10-15 NOTE — Transfer of Care (Signed)
Immediate Anesthesia Transfer of Care Note  Patient: Henry Adams  Procedure(s) Performed: ANTERIOR APPROACH HIP ARTHROPLASTY (Right: Hip)  Patient Location: PACU  Anesthesia Type:Spinal  Level of Consciousness: drowsy  Airway & Oxygen Therapy: Patient Spontanous Breathing and Patient connected to nasal cannula oxygen  Post-op Assessment: Report given to RN and Post -op Vital signs reviewed and stable  Post vital signs: Reviewed and stable  Last Vitals:  Vitals Value Taken Time  BP 142/77 10/15/20 1556  Temp    Pulse 56 10/15/20 1600  Resp 12 10/15/20 1600  SpO2 94 % 10/15/20 1600  Vitals shown include unvalidated device data.  Last Pain:  Vitals:   10/15/20 1238  TempSrc: Axillary  PainSc:       Patients Stated Pain Goal: 3 (14/83/07 3543)  Complications: No notable events documented.

## 2020-10-15 NOTE — ED Notes (Signed)
Provider at bedside

## 2020-10-15 NOTE — Consult Note (Signed)
ORTHOPAEDIC CONSULTATION  REQUESTING PHYSICIAN: Charlynne Cousins, MD  Chief Complaint: right hip pain  HPI: Henry Adams is a 80 y.o. male who presents with right hip pain after a fall at his assisted living facility.  Of note, much of the history was performed directly from his daughter given that he had currently had pain medication at the time of visit.  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  No evidence of previous hip injury or pain.  He does have a history of Parkinson's disease which is controlled with medication.  Per his daughter he does have a recent history of increasing falls and difficulty with mobility.  Good  Past Medical History:  Diagnosis Date   Allergy    perennial   Anxiety    Arthritis    Cholelithiasis    Chronic idiopathic constipation    CKD (chronic kidney disease)    Depression    Diverticulosis    Enlarged prostate    Family history of adverse reaction to anesthesia    Gallstones    GERD (gastroesophageal reflux disease)    HLD (hyperlipidemia)    Hx of cardiovascular stress test    ETT-Myoview 6/14: probable low risk study, inf defect with normal inf wall motion-cannot r/o ischemia; prominent gut uptake adjacent to inf wall on rest images-poss defect is atten, EF 63%   Hypertension    Melanoma (HCC)    Parkinsonism (HCC)    Polycystic kidney disease    PONV (postoperative nausea and vomiting)    Past Surgical History:  Procedure Laterality Date   COLONOSCOPY  2011   diverticulosis   HERNIA REPAIR     umbilical   KNEE SURGERY Left    medialization laryngoplasty  10/15/2013   Dr Joya Gaskins, [facility]   TONSILLECTOMY AND ADENOIDECTOMY     Social History   Socioeconomic History   Marital status: Married    Spouse name: Not on file   Number of children: 2   Years of education: Not on file   Highest education level: Not on file  Occupational History   Occupation: retired    Comment:  business; 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  Tobacco Use   Smoking status: Never   Smokeless tobacco: Never  Vaping Use   Vaping Use: Never used  Substance and Sexual Activity   Alcohol use: Not Currently    Alcohol/week: 0.0 standard drinks   Drug use: No   Sexual activity: Not on file  Other Topics Concern   Not on file  Social History Narrative   Not on file   Social Determinants of Health   Financial Resource Strain: Low Risk    Difficulty of Paying Living Expenses: Not hard at all  Food Insecurity: No Food Insecurity   Worried About Charity fundraiser in the Last Year: Never true   Ran Out of Food in the Last Year: Never true  Transportation Needs: No Transportation Needs   Lack of Transportation (Medical): No   Lack of Transportation (Non-Medical): No  Physical Activity: Inactive   Days of Exercise per Week: 0 days   Minutes of Exercise per Session: 0 min  Stress: No Stress Concern Present   Feeling of Stress : Not at all  Social Connections: Socially Isolated   Frequency of Communication with Friends and Family: More than three times a week   Frequency of Social Gatherings with Friends and Family: More than three times  a week   Attends Religious Services: Never   Active Member of Clubs or Organizations: No   Attends Archivist Meetings: Never   Marital Status: Widowed   Family History  Problem Relation Age of Onset   Hypertension Father    Cancer Brother        mouth   Cancer Sister        breast   Heart disease Brother        heart transplant   Cancer Daughter        breast   Stroke Sister        >65   Skin cancer Brother        squamous cell   Colon cancer Neg Hx    - negative except otherwise stated in the family history section Allergies  Allergen Reactions   Iodine     rash   Oxycodone     Mental status changes & nausea   Pollen Extract Itching   Tamiflu [Oseltamivir Phosphate] Other (See Comments)    hallucinations   Tamsulosin      Other reaction(s): Other (See Comments) Unknown    Doxazosin Rash    agitation   Etodolac Rash    unknown   Tape Rash    Certain "Band-Aids"   Prior to Admission medications   Medication Sig Start Date End Date Taking? Authorizing Provider  acetaminophen (TYLENOL) 500 MG tablet 500 mg prn for back, knee, shoulder or head pain. May be given up to every 6 hours. 02/05/19   [name], MD  alfuzosin (UROXATRAL) 10 MG 24 hr tablet Take 1 tablet (10 mg total) by mouth 2 (two) times daily. 10 mg at 7 am and 7 pm daily. 04/08/19   [name], MD  Carbidopa-Levodopa ER (SINEMET CR) 25-100 MG tablet controlled release TAKE ONE TABLET AT BEDTIME. 11/09/19   Tat, Eustace Quail, DO  Carbidopa-Levodopa ER (SINEMET CR) 25-100 MG tablet controlled release Take 1 tablet by mouth. Take 1 tab at 6am/10am/2pm/ 1.5 tab at 6pm    [provider]  carboxymethylcellulose (REFRESH PLUS) 0.5 % SOLN 1 drop 3 (three) times daily as needed.    [provider]  cefUROXime (CEFTIN) 500 MG tablet TAKE 1 TABLET BY MOUTH TWICE DAILY WITH MEALS FOR 5 DAYS 02/17/20 02/16/21  Deno Etienne, DO  clonazePAM (KLONOPIN) 0.5 MG tablet Take 1 tablet (0.5 mg total) by mouth at bedtime. 02/05/20   [name], MD  donepezil (ARICEPT) 10 MG tablet Take 1 tablet (10 mg total) by mouth at bedtime. 04/09/19   Cottle, Billey Co., MD  fexofenadine (ALLEGRA) 180 MG tablet Take 1 tablet (180 mg total) by mouth daily. 180mg  QD 01/07/20   [name], MD  finasteride (PROSCAR) 5 MG tablet Take 1 tablet (5 mg total) by mouth daily. 5mg  QD 02/05/20   [name], MD  FLUoxetine (PROZAC) 10 MG capsule Take 1 capsule (10 mg total) by mouth daily. 07/10/19   Cottle, Billey Co., MD  meclizine (ANTIVERT) 12.5 MG tablet Take 12.5 mg by mouth 3 (three) times daily as needed for dizziness.    [provider]  memantine (NAMENDA) 10 MG tablet Take 10 mg by mouth 2 (two) times daily. 11/15/19   [provider]   Menthol-Zinc Oxide (CALMOSEPTINE) 0.44-20.6 % OINT Apply 1 Act topically daily. 10/21/19   [name], MD  omeprazole (PRILOSEC) 40 MG capsule TAKE (1) CAPSULE DAILY. Patient taking differently: Take  40 mg by mouth daily. 03/11/19   [name], MD  Polyethylene Glycol 3350 (MIRALAX PO) Take by mouth.    [provider]  Probiotic Product ([place]) CAPS Take 1 capsule by mouth daily. 01/07/20   [name], MD  vitamin B-12 (CYANOCOBALAMIN) 1000 MCG tablet Take 1 tablet (1,000 mcg total) by mouth daily. 01/07/20   [name], MD   DG Chest 1 View  Result Date: 10/15/2020 CLINICAL DATA:  Unwitnessed fall from standing. EXAM: CHEST  1 VIEW COMPARISON:  02/22/2020 FINDINGS: Patient is rotated. Lung volumes are low. Prominent heart size likely accentuated by technique. Bandlike opacity in the right suprahilar region favors atelectasis. Vascular congestion versus bronchovascular crowding. No visualized pneumothorax or confluent airspace disease. No pleural fluid. No acute osseous abnormalities are seen. IMPRESSION: Rotated exam with low lung volumes and right suprahilar atelectasis. Vascular congestion versus bronchovascular crowding. Electronically Signed   By: Keith Rake M.D.   On: 10/15/2020 00:43   CT HEAD WO CONTRAST (5MM)  Result Date: 10/15/2020 CLINICAL DATA:  Head trauma, minor (Age >= 65y) Unwitnessed fall. EXAM: CT HEAD WITHOUT CONTRAST TECHNIQUE: Contiguous axial images were obtained from the base of the skull through the vertex without intravenous contrast. COMPARISON:  02/16/2020, brain MRI 02/10/2017 FINDINGS: Brain: Stable degree of atrophy and chronic small vessel ischemia. No intracranial hemorrhage, mass effect, or midline shift. The previous 1 cm meningioma involving the posterior aspect of the foramen magnum is grossly stable from prior MRI, though not well assessed by CT, series 3, image 3. There is no associated mass effect. No  hydrocephalus. The basilar cisterns are patent. No evidence of territorial infarct or acute ischemia. No extra-axial or intracranial fluid collection. Vascular: Atherosclerosis of skullbase vasculature without hyperdense vessel or abnormal calcification. Skull: No fracture or focal lesion. Sinuses/Orbits: Mucous retention cyst in the left maxillary sinus. No acute findings. Mastoid air cells are clear. Other: None. IMPRESSION: 1. No acute intracranial abnormality. No skull fracture. 2. Stable atrophy and chronic small vessel ischemia. 3. Unchanged small meningioma at the foramen magnum. Electronically Signed   By: Keith Rake M.D.   On: 10/15/2020 00:39   CT Cervical Spine Wo Contrast  Result Date: 10/15/2020 CLINICAL DATA:  Neck trauma (Age >= 65y) Unwitnessed fall from standing. EXAM: CT CERVICAL SPINE WITHOUT CONTRAST TECHNIQUE: Multidetector CT imaging of the cervical spine was performed without intravenous contrast. Multiplanar CT image reconstructions were also generated. COMPARISON:  None. FINDINGS: Alignment: Trace degenerative type anterolisthesis of C7 on T1. No traumatic subluxation. Skull base and vertebrae: No acute fracture. Vertebral body heights are maintained. The dens and skull base are intact. Soft tissues and spinal canal: No prevertebral fluid or swelling. No visible canal hematoma. Disc levels: Diffuse degenerative disc disease, advanced at C5-C6 and C6-C7. There is prominent multilevel facet hypertrophy Upper chest: No acute or unexpected findings. Other: None. IMPRESSION: 1. No acute fracture or subluxation of the cervical spine. 2. Multilevel degenerative disc disease and facet hypertrophy. Electronically Signed   By: Keith Rake M.D.   On: 10/15/2020 00:42   DG Hip Unilat W or Wo Pelvis 2-3 Views Right  Result Date: 10/15/2020 CLINICAL DATA:  Unwitnessed fall from standing.  Right hip pain. EXAM: DG HIP (WITH OR WITHOUT PELVIS) 2-3V RIGHT COMPARISON:  None. FINDINGS:  Displaced right femoral neck fracture. Mild proximal migration of the femoral shaft. Femoral head remains seated. The bones are diffusely under mineralized. No pubic rami fracture. No pubic symphyseal or  sacroiliac joint diastasis. IMPRESSION: Displaced right femoral neck fracture. Electronically Signed   By: Keith Rake M.D.   On: 10/15/2020 00:44     Positive ROS: All other systems have been reviewed and were otherwise negative with the exception of those mentioned in the HPI and as above.  Physical Exam: General: No acute distress Cardiovascular: No pedal edema Respiratory: No cyanosis, no use of accessory musculature GI: No organomegaly, abdomen is soft and non-tender Skin: No lesions in the area of chief complaint Neurologic: Sensation intact distally Psychiatric: Patient is at baseline mood and affect Lymphatic: No axillary or cervical lymphadenopathy  MUSCULOSKELETAL:  Right hip is shortened externally rotated.  He is unable to follow direction due to current mental status.  He does have warm and well-perfused toes with 2+ dorsalis pedis pulse.  He is spontaneously moving the right leg and spontaneously plantar flexing.  Unable to perform sensory exam due to current neuro status  Independent Imaging Review: X-rays right hip  Displaced right femoral neck fracture  Assessment: 80 year old male with a displaced right femoral neck fracture.  Per his daughter he is an assisted living ambulator with the use of a walker.  I have spoken with his daughter and together we both believe that operative intervention with a hemiarthroplasty is indicated in order to not lose any additional mobility.  We discussed that the goal of the surgery is to provide a stable hip on which she can ambulate.  We discussed that given the displaced nature of the fracture I would recommend a hemiarthroplasty is the best surgical option for him.  She understands this and would like to proceed  Plan: Plan for  right hip hemiarthroplasty   After a lengthy discussion of treatment options, including risks, benefits, alternatives, complications of surgical and nonsurgical conservative options, the patient elected surgical repair.   The patient  is aware of the material risks  and complications including, but not limited to injury to adjacent structures, neurovascular injury, infection, numbness, bleeding, implant failure, thermal burns, stiffness, persistent pain, failure to heal, disease transmission from allograft, need for further surgery, dislocation, anesthetic risks, blood clots, risks of death,and others. The probabilities of surgical success and failure discussed with patient given their particular co-morbidities.The time and nature of expected rehabilitation and recovery was discussed.The patient's questions were all answered preoperatively.  No barriers to understanding were noted. I explained the natural history of the disease process and Rx rationale.  I explained to the patient what I considered to be reasonable expectations given their personal situation.  The final treatment plan was arrived at through a shared patient decision making process model.   Thank you for the consult and the opportunity to see Mr. Cassie Henkels, MD [facility] 12:34 PM

## 2020-10-15 NOTE — Op Note (Signed)
Date of Surgery: 10/15/2020  INDICATIONS: Henry Adams is a 80 y.o.-year-old male with right displaced femoral nec fracture.  The risk and benefits of the procedure with discussed in detail and documented in the pre-operative evaluation.  PREOPERATIVE DIAGNOSIS: right displaced femoral neck fracture  POSTOPERATIVE DIAGNOSIS: Same.  PROCEDURE: right hip hemiarthroplasty  SURGEON: Yevonne Pax MD  ASSISTANT: Shirley Friar, ATC; necessary for the timely completion of procedure and due to complexity of procedure.  ANESTHESIA:  general  IV FLUIDS AND URINE: See anesthesia record.  ANTIBIOTICS: 2g ancef  ESTIMATED BLOOD LOSS: 50 mL.  IMPLANTS:  Implant Name Type Inv. Item Serial No. Manufacturer Lot No. LRB No. Used Action  SPACER DEPUY - [id number] Hips SPACER DEPUY  DEPUY ORTHOPAEDICS [id number] Right 1 Implanted  HIP BALL DEPUY 53 - [id number] Hips HIP BALL DEPUY 53  DEPUY ORTHOPAEDICS [id number] Right 1 Implanted  IMPLANT CENTRALIZER DEPUY - [id number] Hips IMPLANT CENTRALIZER DEPUY  DEPUY ORTHOPAEDICS [id number] Right 1 Implanted  CEMENT BONE SIMPLEX SPEEDSET - [id number] Cement CEMENT BONE SIMPLEX SPEEDSET  STRYKER ORTHOPEDICS [id number] Right 2 Implanted  STEM SUMMIT CEMENT BASIC SZ4 - [id number] Hips STEM SUMMIT CEMENT BASIC SZ4  DEPUY ORTHOPAEDICS [id number] Right 1 Implanted  RESTRICTOR CEMENT PE SZ 2 - [id number] Cement RESTRICTOR CEMENT PE SZ 2  DEPUY ORTHOPAEDICS [id number] Right 1 Implanted   COMPLICATIONS: none   DESCRIPTION OF PROCEDURE:  The patient was identified in the preoperative holding area.  The correct operative side was identified and a timeout was performed according to universal protocol with nursing.  2g of Ancef was given prior to coming back to the operating room.  He was subsequently brought back to the operating room.  Anesthesia was induced after an epidural was performed.   The patient was placed in the lateral position using a beanbag positioner.  The correct operative side  was up.  Patient was prepped and draped in the usual sterile fashion.  Of note the patient does have an allergy to both chlorhexidine and Betadine, and as such chlorhexidine was utilized but thoroughly washed off at the end of the surgery.  An incision was made centered over the vastus ridge about the lateral aspect of the hip.  Sharp dissection was performed on the level of the IT band electrocautery was used to perform hemostasis.  A nick was then made in the IT band and Mayo scissors were utilized to split the IT band both proximal and distal.  We then dissected off the bursal tissue using Mayo scissors and a body.  We identified a split in the gluteus medius between the one third and posterior two thirds of the tendon.  A sponge was placed in the splint for temporary visualization.  We then marked out our cuff of tissue continuous with a split distally.  Electrocautery was utilized to elevate this off of the bone and a cuff in conjunction with the medius and the minimus.  External rotation was utilized to help dissect the tissue off of the bone.  We tagged this cuff of tissue with an 0 Vicryl.  We then continue this split through the hip capsule.  We use this to expose the femoral neck.  A provisional cut was then made with a saw on the femur.  This gave [place] better visualization to see the femoral had a power corkscrew was utilized to remove this.  We then irrigated the acetabulum to ensure there were no more bony pieces in the acetabulum.  We were satisfied  with this.   The leg was then brought forward with external rotation into the sterile bag to work on the femur.  His retractors as well as guidance were used for exposure.  We open the canal with the canal finder and subsequently broached up to a size 6 broach with excellent rotational stability.  A trial reduction was performed.  At this time we noticed a slight crack in the anterior cortex but did not extend to the calcar.  This was self-contained.  As  such the decision was made to switch to a cemented stem.  Given the difficulty in reduction, an additional 2 mm was taken off of the femoral neck cut.  We then transitioned to a size 4 Summit cemented stem.  The correct broach was then used for the Summit stem.  A canal restrictor was placed 2 cm distal to the implant.  We subsequently irrigated and thoroughly dried the canal.  Cement was then placed and pressurized in the femoral canal.  The final implant was put in place and held until the cement was cured.  This provided excellent rotational stability.  We used a size 53 head according to our trialing which had a good reduction.   Again reduction was performed with a good palpable clunk.  A flatplate x-ray was performed to show concentric reduction.  The wound was finally irrigated and closed in layers using 0 Vicryl for the medius tissue, 0 Vicryl for the IT band, 2-0 Vicryl for the subcutaneous tissue, and staples for the skin.  The prep was thoroughly washed off of them.  A sterile Aquacel dressing was placed.  He was awakened with no issue and taken to the postoperative unit.  All counts were correct at the end of the case       Shirley Friar ATC was necessary for opening, closing, retracting, limb positioning and overall facilitation and timely completion of the procedure.         POSTOPERATIVE PLAN: He will be weightbearing as tolerated on the right leg and activity as tolerated.  We will perform a physical therapy consult so that he can be up and mobilize.  He will be placed on Lovenox postop.  He will follow-up with me 2 weeks postop for staple removal and assessment.    Shirley Friar ATC was necessary for opening, closing, retracting, limb positioning and overall facilitation and timely completion of the procedure.    Yevonne Pax, MD 4:15 PM

## 2020-10-15 NOTE — H&P (View-Only) (Signed)
ORTHOPAEDIC CONSULTATION  REQUESTING PHYSICIAN: Charlynne Cousins, MD  Chief Complaint: right hip pain  HPI: Henry Adams is a 80 y.o. male who presents with right hip pain after a fall at his assisted living facility.  Of note, much of the history was performed directly from his daughter given that he had currently had pain medication at the time of visit.  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  No evidence of previous hip injury or pain.  He does have a history of Parkinson's disease which is controlled with medication.  Per his daughter he does have a recent history of increasing falls and difficulty with mobility.  Good  Past Medical History:  Diagnosis Date   Allergy    perennial   Anxiety    Arthritis    Cholelithiasis    Chronic idiopathic constipation    CKD (chronic kidney disease)    Depression    Diverticulosis    Enlarged prostate    Family history of adverse reaction to anesthesia    Gallstones    GERD (gastroesophageal reflux disease)    HLD (hyperlipidemia)    Hx of cardiovascular stress test    ETT-Myoview 6/14: probable low risk study, inf defect with normal inf wall motion-cannot r/o ischemia; prominent gut uptake adjacent to inf wall on rest images-poss defect is atten, EF 63%   Hypertension    Melanoma (HCC)    Parkinsonism (HCC)    Polycystic kidney disease    PONV (postoperative nausea and vomiting)    Past Surgical History:  Procedure Laterality Date   COLONOSCOPY  2011   diverticulosis   HERNIA REPAIR     umbilical   KNEE SURGERY Left    medialization laryngoplasty  10/15/2013   Dr Joya Gaskins, [facility]   TONSILLECTOMY AND ADENOIDECTOMY     Social History   Socioeconomic History   Marital status: Married    Spouse name: Not on file   Number of children: 2   Years of education: Not on file   Highest education level: Not on file  Occupational History   Occupation: retired    Comment:  business; 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  Tobacco Use   Smoking status: Never   Smokeless tobacco: Never  Vaping Use   Vaping Use: Never used  Substance and Sexual Activity   Alcohol use: Not Currently    Alcohol/week: 0.0 standard drinks   Drug use: No   Sexual activity: Not on file  Other Topics Concern   Not on file  Social History Narrative   Not on file   Social Determinants of Health   Financial Resource Strain: Low Risk    Difficulty of Paying Living Expenses: Not hard at all  Food Insecurity: No Food Insecurity   Worried About Charity fundraiser in the Last Year: Never true   Ran Out of Food in the Last Year: Never true  Transportation Needs: No Transportation Needs   Lack of Transportation (Medical): No   Lack of Transportation (Non-Medical): No  Physical Activity: Inactive   Days of Exercise per Week: 0 days   Minutes of Exercise per Session: 0 min  Stress: No Stress Concern Present   Feeling of Stress : Not at all  Social Connections: Socially Isolated   Frequency of Communication with Friends and Family: More than three times a week   Frequency of Social Gatherings with Friends and Family: More than three times  a week   Attends Religious Services: Never   Active Member of Clubs or Organizations: No   Attends Archivist Meetings: Never   Marital Status: Widowed   Family History  Problem Relation Age of Onset   Hypertension Father    Cancer Brother        mouth   Cancer Sister        breast   Heart disease Brother        heart transplant   Cancer Daughter        breast   Stroke Sister        >65   Skin cancer Brother        squamous cell   Colon cancer Neg Hx    - negative except otherwise stated in the family history section Allergies  Allergen Reactions   Iodine     rash   Oxycodone     Mental status changes & nausea   Pollen Extract Itching   Tamiflu [Oseltamivir Phosphate] Other (See Comments)    hallucinations   Tamsulosin      Other reaction(s): Other (See Comments) Unknown    Doxazosin Rash    agitation   Etodolac Rash    unknown   Tape Rash    Certain "Band-Aids"   Prior to Admission medications   Medication Sig Start Date End Date Taking? Authorizing Provider  acetaminophen (TYLENOL) 500 MG tablet 500 mg prn for back, knee, shoulder or head pain. May be given up to every 6 hours. 02/05/19   [name], MD  alfuzosin (UROXATRAL) 10 MG 24 hr tablet Take 1 tablet (10 mg total) by mouth 2 (two) times daily. 10 mg at 7 am and 7 pm daily. 04/08/19   [name], MD  Carbidopa-Levodopa ER (SINEMET CR) 25-100 MG tablet controlled release TAKE ONE TABLET AT BEDTIME. 11/09/19   Tat, Eustace Quail, DO  Carbidopa-Levodopa ER (SINEMET CR) 25-100 MG tablet controlled release Take 1 tablet by mouth. Take 1 tab at 6am/10am/2pm/ 1.5 tab at 6pm    [provider]  carboxymethylcellulose (REFRESH PLUS) 0.5 % SOLN 1 drop 3 (three) times daily as needed.    [provider]  cefUROXime (CEFTIN) 500 MG tablet TAKE 1 TABLET BY MOUTH TWICE DAILY WITH MEALS FOR 5 DAYS 02/17/20 02/16/21  Deno Etienne, DO  clonazePAM (KLONOPIN) 0.5 MG tablet Take 1 tablet (0.5 mg total) by mouth at bedtime. 02/05/20   [name], MD  donepezil (ARICEPT) 10 MG tablet Take 1 tablet (10 mg total) by mouth at bedtime. 04/09/19   Cottle, Billey Co., MD  fexofenadine (ALLEGRA) 180 MG tablet Take 1 tablet (180 mg total) by mouth daily. 180mg  QD 01/07/20   [name], MD  finasteride (PROSCAR) 5 MG tablet Take 1 tablet (5 mg total) by mouth daily. 5mg  QD 02/05/20   [name], MD  FLUoxetine (PROZAC) 10 MG capsule Take 1 capsule (10 mg total) by mouth daily. 07/10/19   Cottle, Billey Co., MD  meclizine (ANTIVERT) 12.5 MG tablet Take 12.5 mg by mouth 3 (three) times daily as needed for dizziness.    [provider]  memantine (NAMENDA) 10 MG tablet Take 10 mg by mouth 2 (two) times daily. 11/15/19   [provider]   Menthol-Zinc Oxide (CALMOSEPTINE) 0.44-20.6 % OINT Apply 1 Act topically daily. 10/21/19   [name], MD  omeprazole (PRILOSEC) 40 MG capsule TAKE (1) CAPSULE DAILY. Patient taking differently: Take  40 mg by mouth daily. 03/11/19   [name], MD  Polyethylene Glycol 3350 (MIRALAX PO) Take by mouth.    [provider]  Probiotic Product (Clarion) CAPS Take 1 capsule by mouth daily. 01/07/20   [name], MD  vitamin B-12 (CYANOCOBALAMIN) 1000 MCG tablet Take 1 tablet (1,000 mcg total) by mouth daily. 01/07/20   [name], MD   DG Chest 1 View  Result Date: 10/15/2020 CLINICAL DATA:  Unwitnessed fall from standing. EXAM: CHEST  1 VIEW COMPARISON:  02/22/2020 FINDINGS: Patient is rotated. Lung volumes are low. Prominent heart size likely accentuated by technique. Bandlike opacity in the right suprahilar region favors atelectasis. Vascular congestion versus bronchovascular crowding. No visualized pneumothorax or confluent airspace disease. No pleural fluid. No acute osseous abnormalities are seen. IMPRESSION: Rotated exam with low lung volumes and right suprahilar atelectasis. Vascular congestion versus bronchovascular crowding. Electronically Signed   By: Keith Rake M.D.   On: 10/15/2020 00:43   CT HEAD WO CONTRAST (5MM)  Result Date: 10/15/2020 CLINICAL DATA:  Head trauma, minor (Age >= 65y) Unwitnessed fall. EXAM: CT HEAD WITHOUT CONTRAST TECHNIQUE: Contiguous axial images were obtained from the base of the skull through the vertex without intravenous contrast. COMPARISON:  02/16/2020, brain MRI 02/10/2017 FINDINGS: Brain: Stable degree of atrophy and chronic small vessel ischemia. No intracranial hemorrhage, mass effect, or midline shift. The previous 1 cm meningioma involving the posterior aspect of the foramen magnum is grossly stable from prior MRI, though not well assessed by CT, series 3, image 3. There is no associated mass effect. No  hydrocephalus. The basilar cisterns are patent. No evidence of territorial infarct or acute ischemia. No extra-axial or intracranial fluid collection. Vascular: Atherosclerosis of skullbase vasculature without hyperdense vessel or abnormal calcification. Skull: No fracture or focal lesion. Sinuses/Orbits: Mucous retention cyst in the left maxillary sinus. No acute findings. Mastoid air cells are clear. Other: None. IMPRESSION: 1. No acute intracranial abnormality. No skull fracture. 2. Stable atrophy and chronic small vessel ischemia. 3. Unchanged small meningioma at the foramen magnum. Electronically Signed   By: Keith Rake M.D.   On: 10/15/2020 00:39   CT Cervical Spine Wo Contrast  Result Date: 10/15/2020 CLINICAL DATA:  Neck trauma (Age >= 65y) Unwitnessed fall from standing. EXAM: CT CERVICAL SPINE WITHOUT CONTRAST TECHNIQUE: Multidetector CT imaging of the cervical spine was performed without intravenous contrast. Multiplanar CT image reconstructions were also generated. COMPARISON:  None. FINDINGS: Alignment: Trace degenerative type anterolisthesis of C7 on T1. No traumatic subluxation. Skull base and vertebrae: No acute fracture. Vertebral body heights are maintained. The dens and skull base are intact. Soft tissues and spinal canal: No prevertebral fluid or swelling. No visible canal hematoma. Disc levels: Diffuse degenerative disc disease, advanced at C5-C6 and C6-C7. There is prominent multilevel facet hypertrophy Upper chest: No acute or unexpected findings. Other: None. IMPRESSION: 1. No acute fracture or subluxation of the cervical spine. 2. Multilevel degenerative disc disease and facet hypertrophy. Electronically Signed   By: Keith Rake M.D.   On: 10/15/2020 00:42   DG Hip Unilat W or Wo Pelvis 2-3 Views Right  Result Date: 10/15/2020 CLINICAL DATA:  Unwitnessed fall from standing.  Right hip pain. EXAM: DG HIP (WITH OR WITHOUT PELVIS) 2-3V RIGHT COMPARISON:  None. FINDINGS:  Displaced right femoral neck fracture. Mild proximal migration of the femoral shaft. Femoral head remains seated. The bones are diffusely under mineralized. No pubic rami fracture. No pubic symphyseal or  sacroiliac joint diastasis. IMPRESSION: Displaced right femoral neck fracture. Electronically Signed   By: Keith Rake M.D.   On: 10/15/2020 00:44     Positive ROS: All other systems have been reviewed and were otherwise negative with the exception of those mentioned in the HPI and as above.  Physical Exam: General: No acute distress Cardiovascular: No pedal edema Respiratory: No cyanosis, no use of accessory musculature GI: No organomegaly, abdomen is soft and non-tender Skin: No lesions in the area of chief complaint Neurologic: Sensation intact distally Psychiatric: Patient is at baseline mood and affect Lymphatic: No axillary or cervical lymphadenopathy  MUSCULOSKELETAL:  Right hip is shortened externally rotated.  He is unable to follow direction due to current mental status.  He does have warm and well-perfused toes with 2+ dorsalis pedis pulse.  He is spontaneously moving the right leg and spontaneously plantar flexing.  Unable to perform sensory exam due to current neuro status  Independent Imaging Review: X-rays right hip  Displaced right femoral neck fracture  Assessment: 80 year old male with a displaced right femoral neck fracture.  Per his daughter he is an assisted living ambulator with the use of a walker.  I have spoken with his daughter and together we both believe that operative intervention with a hemiarthroplasty is indicated in order to not lose any additional mobility.  We discussed that the goal of the surgery is to provide a stable hip on which she can ambulate.  We discussed that given the displaced nature of the fracture I would recommend a hemiarthroplasty is the best surgical option for him.  She understands this and would like to proceed  Plan: Plan for  right hip hemiarthroplasty   After a lengthy discussion of treatment options, including risks, benefits, alternatives, complications of surgical and nonsurgical conservative options, the patient elected surgical repair.   The patient  is aware of the material risks  and complications including, but not limited to injury to adjacent structures, neurovascular injury, infection, numbness, bleeding, implant failure, thermal burns, stiffness, persistent pain, failure to heal, disease transmission from allograft, need for further surgery, dislocation, anesthetic risks, blood clots, risks of death,and others. The probabilities of surgical success and failure discussed with patient given their particular co-morbidities.The time and nature of expected rehabilitation and recovery was discussed.The patient's questions were all answered preoperatively.  No barriers to understanding were noted. I explained the natural history of the disease process and Rx rationale.  I explained to the patient what I considered to be reasonable expectations given their personal situation.  The final treatment plan was arrived at through a shared patient decision making process model.   Thank you for the consult and the opportunity to see Mr. Henry Sudbury, MD [facility] 12:34 PM

## 2020-10-15 NOTE — Anesthesia Preprocedure Evaluation (Addendum)
Anesthesia Evaluation  Patient identified by MRN, date of birth, ID band Patient awake    Reviewed: Allergy & Precautions, NPO status , Patient's Chart, lab work & pertinent test results  History of Anesthesia Complications (+) PONV  Airway Mallampati: III  TM Distance: >3 FB Neck ROM: Limited  Mouth opening: Limited Mouth Opening Comment: Pt uncooperative Dental  (+) Dental Advisory Given   Pulmonary  H/o vocal cord medialization   breath sounds clear to auscultation       Cardiovascular hypertension (no longer on BP meds), + CAD ('14 L heart cath  This showed mild CAD)   Rhythm:Regular Rate:Normal     Neuro/Psych Anxiety Depression Dementia Parkinson's: tremor, dementia    GI/Hepatic Neg liver ROS, GERD  Medicated and Controlled,  Endo/Other  negative endocrine ROS  Renal/GU Renal InsufficiencyRenal disease     Musculoskeletal  (+) Arthritis ,   Abdominal   Peds  Hematology negative hematology ROS (+)   Anesthesia Other Findings   Reproductive/Obstetrics                            Anesthesia Physical Anesthesia Plan  ASA: 3  Anesthesia Plan: Spinal   Post-op Pain Management:    Induction:   PONV Risk Score and Plan: 2 and Ondansetron and Dexamethasone  Airway Management Planned: Natural Airway and Simple Face Mask  Additional Equipment: None  Intra-op Plan:   Post-operative Plan:   Informed Consent: I have reviewed the patients History and Physical, chart, labs and discussed the procedure including the risks, benefits and alternatives for the proposed anesthesia with the patient or authorized representative who has indicated his/her understanding and acceptance.     Dental advisory given and Consent reviewed with POA  Plan Discussed with: Surgeon and CRNA  Anesthesia Plan Comments:        Anesthesia Quick Evaluation

## 2020-10-15 NOTE — Interval H&P Note (Signed)
History and Physical Interval Note:  10/15/2020 12:39 PM  Henry Adams  has presented today for surgery, with the diagnosis of Right Hip Fracture.  The various methods of treatment have been discussed with the patient and family. After consideration of risks, benefits and other options for treatment, the patient has consented to  Procedure(s): ANTERIOR APPROACH HIP ARTHROPLASTY (Right) as a surgical intervention.  The patient's history has been reviewed, patient examined, no change in status, stable for surgery.  I have reviewed the patient's chart and labs.  Questions were answered to the patient's satisfaction.     Vanetta Mulders

## 2020-10-15 NOTE — Plan of Care (Signed)

## 2020-10-15 NOTE — Anesthesia Procedure Notes (Addendum)
Procedure Name: MAC Date/Time: 10/15/2020 1:25 PM Performed by: Dorthea Cove, CRNA Pre-anesthesia Checklist: Patient identified, Emergency Drugs available, Suction available, Patient being monitored and Timeout performed Patient Re-evaluated:Patient Re-evaluated prior to induction Oxygen Delivery Method: Nasal cannula Preoxygenation: Pre-oxygenation with 100% oxygen Induction Type: IV induction Placement Confirmation: positive ETCO2 and CO2 detector Dental Injury: Teeth and Oropharynx as per pre-operative assessment

## 2020-10-15 NOTE — Anesthesia Postprocedure Evaluation (Signed)
Anesthesia Post Note  Patient: Henry Adams  Procedure(s) Performed: ANTERIOR APPROACH HIP ARTHROPLASTY (Right: Hip)     Patient location during evaluation: PACU Anesthesia Type: Spinal Level of consciousness: awake and alert and patient cooperative Pain management: pain level controlled Vital Signs Assessment: post-procedure vital signs reviewed and stable Respiratory status: spontaneous breathing, nonlabored ventilation and respiratory function stable Cardiovascular status: blood pressure returned to baseline and stable Postop Assessment: patient able to bend at knees and no apparent nausea or vomiting Anesthetic complications: no   No notable events documented.  Last Vitals:  Vitals:   10/15/20 1726 10/15/20 1741  BP: (!) 146/89 140/81  Pulse: 68 (!) 56  Resp: (!) 9 10  Temp:    SpO2: 95% 95%    Last Pain:  Vitals:   10/15/20 1656  TempSrc:   PainSc: 0-No pain                 Leiah Giannotti,E. Monterius Rolf

## 2020-10-16 DIAGNOSIS — S72001A Fracture of unspecified part of neck of right femur, initial encounter for closed fracture: Secondary | ICD-10-CM | POA: Diagnosis not present

## 2020-10-16 DIAGNOSIS — S7291XP Unspecified fracture of right femur, subsequent encounter for closed fracture with malunion: Secondary | ICD-10-CM | POA: Diagnosis not present

## 2020-10-16 DIAGNOSIS — W19XXXA Unspecified fall, initial encounter: Secondary | ICD-10-CM | POA: Diagnosis not present

## 2020-10-16 LAB — CBC
HCT: 34.4 % — ABNORMAL LOW (ref 39.0–52.0)
Hemoglobin: 11.6 g/dL — ABNORMAL LOW (ref 13.0–17.0)
MCH: 31.6 pg (ref 26.0–34.0)
MCHC: 33.7 g/dL (ref 30.0–36.0)
MCV: 93.7 fL (ref 80.0–100.0)
Platelets: 131 10*3/uL — ABNORMAL LOW (ref 150–400)
RBC: 3.67 MIL/uL — ABNORMAL LOW (ref 4.22–5.81)
RDW: 13.5 % (ref 11.5–15.5)
WBC: 12.5 10*3/uL — ABNORMAL HIGH (ref 4.0–10.5)
nRBC: 0 % (ref 0.0–0.2)

## 2020-10-16 LAB — BASIC METABOLIC PANEL
Anion gap: 8 (ref 5–15)
BUN: 36 mg/dL — ABNORMAL HIGH (ref 8–23)
CO2: 22 mmol/L (ref 22–32)
Calcium: 8 mg/dL — ABNORMAL LOW (ref 8.9–10.3)
Chloride: 104 mmol/L (ref 98–111)
Creatinine, Ser: 1.9 mg/dL — ABNORMAL HIGH (ref 0.61–1.24)
GFR, Estimated: 35 mL/min — ABNORMAL LOW (ref >60)
Glucose, Bld: 121 mg/dL — ABNORMAL HIGH (ref 70–99)
Potassium: 4 mmol/L (ref 3.5–5.1)
Sodium: 134 mmol/L — ABNORMAL LOW (ref 135–145)

## 2020-10-16 LAB — URINE CULTURE: Culture: <10000 — AB

## 2020-10-16 MED ORDER — BISACODYL 5 MG PO TBEC: 5.0000 mg | DELAYED_RELEASE_TABLET | Freq: Every day | ORAL | Status: DC | PRN

## 2020-10-16 MED ORDER — HALOPERIDOL LACTATE 5 MG/ML IJ SOLN: 1.0000 mg | Freq: Four times a day (QID) | INTRAMUSCULAR | Status: DC | PRN

## 2020-10-16 MED ORDER — SENNOSIDES-DOCUSATE SODIUM 8.6-50 MG PO TABS
2.0000 | ORAL_TABLET | Freq: Two times a day (BID) | ORAL | Status: DC
  Administered 2020-10-16 – 2020-10-19 (×7): 2 via ORAL
  Filled 2020-10-16 (×7): qty 2

## 2020-10-16 MED ORDER — MELATONIN 3 MG PO TABS: 3.0000 mg | ORAL_TABLET | Freq: Every day | ORAL | Status: DC

## 2020-10-16 NOTE — Progress Notes (Signed)
   Subjective:  Patient reports pain is well controlled.  Had a conversation with both him and his daughter at the bedside today.  He is pending physical therapy for mobilization  Objective:   VITALS:   Vitals:   10/15/20 1741 10/15/20 1807 10/15/20 2223 10/16/20 0028  BP: 140/81 (!) 150/84 121/83 127/67  Pulse: (!) 56 66 66 74  Resp: 10 14 15 16   Temp:  97.8 F (36.6 C) 98.3 F (36.8 C)   TempSrc:  Oral Oral   SpO2: 95% 98% 94% 92%  Weight:      Height:       Right hip dressing is clean and dry.  Limb lengths are equal.  He is able to extend and flex at the ankle without difficulty.  All of his toes are warm and well-perfused.   Lab Results  Component Value Date   WBC 12.5 (H) 10/16/2020   HGB 11.6 (L) 10/16/2020   HCT 34.4 (L) 10/16/2020   MCV 93.7 10/16/2020   PLT 131 (L) 10/16/2020     Assessment/Plan:  1 Day Post-Op status post right hip hemiarthroplasty  - Expected postop acute blood loss anemia - will monitor for symptoms - Patient to work with PT/OT to optimize mobilization safely - DVT ppx - SCDs, ambulation, Lovenox - Postoperative Abx: Ancef x 2 additional doses given - WBAT operative extremity - Pain control - multimodal pain management, ATC acetaminophen in conjunction with as needed narcotic (oxycodone), although this should be minimized with other modalities  - Discharge planning pending CM, appreciate coordination    Henry Adams 10/16/2020, 8:05 AM

## 2020-10-16 NOTE — Progress Notes (Signed)
[facility] PROGRESS NOTE    Progress Note  Henry Adams  DJM:[id number] DOB: 1940-02-01 DOA: 10/14/2020 PCP: [name], MD     Brief Narrative:   Henry Adams is an 80 y.o. male past medical history significant for Parkinson's disease, peripheral neuropathy with history of B12 deficiency chronic anxiety restless leg syndrome on Klonopin, chronic constipation dementia recurrent falls presents to the ED after an unwitnessed fall complaining of right hip pain work-up in the ED revealed displaced right femoral fracture orthopedic surgery was consulted.  Assessment/Plan:   Acute displaced right femoral neck fracture due to mechanical fall: Orthopedic surgery has been consulted. He is status post right hip hemiarthroplasty. Awaiting physical therapy evaluation. Continue Lovenox for DVT prophylaxis. Outpatient DVT prophylaxis per orthopedic surgery. Continue current pain control regimen, orthopedic to address outpatient pain control regimen.  Preoperative risk assessment: Using the [facility] preoperative cardiac risk calculator, the patient's estimated risk probability for perioperative MI or cardiac arrest is 0.6. Risk and benefits were explained to the patient and the family and they would like to proceed with surgical intervention.  Mild leukocytosis:  Likely due to reactive acute fracture.  Continues to remain afebrile.  Essential hypertension uncontrolled: Blood pressure today is improved continue current regimen.  Parkinson's disease/dementia: Continue current home regimen. Continue Namenda and Aricept. He is at high risk of delirium and aspiration. He is extremely confused this morning.  Chronic anxiety/depression/restless leg syndrome: Continue Prozac and benzodiazepines. She is in significant risk of aspiration and delirium. Her QTC is preserved. Will use Haldol IV as needed melatonin at bedtime.  BPH: Continue Proscar and alfuzosin.  Chronic  constipation: Continue MiraLAX    DVT prophylaxis: lovenox Family Communication:none Status is: Inpatient  Remains inpatient appropriate because:Hemodynamically unstable  Dispo:  Patient From: [facility]  Planned Disposition: [facility]  Medically stable for discharge: No   Code Status:     Code Status Orders  (From admission, onward)           Start     Ordered   10/15/20 0228  Full code  Continuous        10/15/20 0228           Code Status History     Date Active Date Inactive Code Status Order ID Comments User Context   06/16/2018 2333 06/19/2018 2206 Full Code [id number]  Ivor Costa, MD ED         IV Access:   Peripheral IV   Procedures and diagnostic studies:   DG Chest 1 View  Result Date: 10/15/2020 CLINICAL DATA:  Unwitnessed fall from standing. EXAM: CHEST  1 VIEW COMPARISON:  02/22/2020 FINDINGS: Patient is rotated. Lung volumes are low. Prominent heart size likely accentuated by technique. Bandlike opacity in the right suprahilar region favors atelectasis. Vascular congestion versus bronchovascular crowding. No visualized pneumothorax or confluent airspace disease. No pleural fluid. No acute osseous abnormalities are seen. IMPRESSION: Rotated exam with low lung volumes and right suprahilar atelectasis. Vascular congestion versus bronchovascular crowding. Electronically Signed   By: Keith Rake M.D.   On: 10/15/2020 00:43   DG Pelvis 1-2 Views  Result Date: 10/15/2020 CLINICAL DATA:  Postop EXAM: PELVIS - 1-2 VIEW COMPARISON:  X-ray intraoperative right hip 10/15/2020 FINDINGS: Status post total right hip arthroplasty. There is no evidence of pelvic fracture or diastasis. No pelvic bone lesions are seen. Right hip subcutaneus soft tissue edema and emphysema consistent with postsurgical changes. IMPRESSION: Status post total right hip arthroplasty.  Electronically Signed   By: Iven Finn M.D.   On: 10/15/2020 19:09    DG Pelvis 1-2 Views  Result Date: 10/15/2020 CLINICAL DATA:  Postop right hip arthroplasty. EXAM: PELVIS - 1 VIEW COMPARISON:  10/05/2019 FINDINGS: Single view shows a right hip hemiarthroplasty that appears well seated and aligned. Two hemostats project above the arthroplasty, a third medial. IMPRESSION: Single portable operative image shows a well-positioned right hip hemiarthroplasty. Electronically Signed   By: Lajean Manes M.D.   On: 10/15/2020 15:27   CT HEAD WO CONTRAST (5MM)  Result Date: 10/15/2020 CLINICAL DATA:  Head trauma, minor (Age >= 65y) Unwitnessed fall. EXAM: CT HEAD WITHOUT CONTRAST TECHNIQUE: Contiguous axial images were obtained from the base of the skull through the vertex without intravenous contrast. COMPARISON:  02/16/2020, brain MRI 02/10/2017 FINDINGS: Brain: Stable degree of atrophy and chronic small vessel ischemia. No intracranial hemorrhage, mass effect, or midline shift. The previous 1 cm meningioma involving the posterior aspect of the foramen magnum is grossly stable from prior MRI, though not well assessed by CT, series 3, image 3. There is no associated mass effect. No hydrocephalus. The basilar cisterns are patent. No evidence of territorial infarct or acute ischemia. No extra-axial or intracranial fluid collection. Vascular: Atherosclerosis of skullbase vasculature without hyperdense vessel or abnormal calcification. Skull: No fracture or focal lesion. Sinuses/Orbits: Mucous retention cyst in the left maxillary sinus. No acute findings. Mastoid air cells are clear. Other: None. IMPRESSION: 1. No acute intracranial abnormality. No skull fracture. 2. Stable atrophy and chronic small vessel ischemia. 3. Unchanged small meningioma at the foramen magnum. Electronically Signed   By: Keith Rake M.D.   On: 10/15/2020 00:39   CT Cervical Spine Wo Contrast  Result Date: 10/15/2020 CLINICAL DATA:  Neck trauma (Age >= 65y) Unwitnessed fall from standing. EXAM: CT  CERVICAL SPINE WITHOUT CONTRAST TECHNIQUE: Multidetector CT imaging of the cervical spine was performed without intravenous contrast. Multiplanar CT image reconstructions were also generated. COMPARISON:  None. FINDINGS: Alignment: Trace degenerative type anterolisthesis of C7 on T1. No traumatic subluxation. Skull base and vertebrae: No acute fracture. Vertebral body heights are maintained. The dens and skull base are intact. Soft tissues and spinal canal: No prevertebral fluid or swelling. No visible canal hematoma. Disc levels: Diffuse degenerative disc disease, advanced at C5-C6 and C6-C7. There is prominent multilevel facet hypertrophy Upper chest: No acute or unexpected findings. Other: None. IMPRESSION: 1. No acute fracture or subluxation of the cervical spine. 2. Multilevel degenerative disc disease and facet hypertrophy. Electronically Signed   By: Keith Rake M.D.   On: 10/15/2020 00:42   DG Hip Unilat W or Wo Pelvis 2-3 Views Right  Result Date: 10/15/2020 CLINICAL DATA:  Unwitnessed fall from standing.  Right hip pain. EXAM: DG HIP (WITH OR WITHOUT PELVIS) 2-3V RIGHT COMPARISON:  None. FINDINGS: Displaced right femoral neck fracture. Mild proximal migration of the femoral shaft. Femoral head remains seated. The bones are diffusely under mineralized. No pubic rami fracture. No pubic symphyseal or sacroiliac joint diastasis. IMPRESSION: Displaced right femoral neck fracture. Electronically Signed   By: Keith Rake M.D.   On: 10/15/2020 00:44     Medical Consultants:   None.   Subjective:    Said Rueb Lamboy pain not controlled.  A little bit hesitant this morning to work with physical therapy wants to try to move the leg that was surgical intervened on.  Objective:    Vitals:   10/15/20 1741 10/15/20 1807 10/15/20 2223 10/16/20  0028  BP: 140/81 (!) 150/84 121/83 127/67  Pulse: (!) 56 66 66 74  Resp: 10 14 15 16   Temp:  97.8 F (36.6 C) 98.3 F (36.8 C)   TempSrc:  Oral  Oral   SpO2: 95% 98% 94% 92%  Weight:      Height:       SpO2: 92 % O2 Flow Rate (L/min): 1 L/min   Intake/Output Summary (Last 24 hours) at 10/16/2020 0938 Last data filed at 10/16/2020 0600 Gross per 24 hour  Intake 1649.34 ml  Output 750 ml  Net 899.34 ml   Filed Weights   10/14/20 2316  Weight: 77.1 kg    Exam: General exam: In no acute distress. Respiratory system: Good air movement and clear to auscultation. Cardiovascular system: S1 & S2 heard, RRR. No JVD. Gastrointestinal system: Abdomen is nondistended, soft and nontender.  Extremities: No pedal edema. Skin: No rashes, lesions or ulcers  Data Reviewed:    Labs: Basic Metabolic Panel: Recent Labs  Lab 10/14/20 2327 10/15/20 0349 10/16/20 0507  NA 136 136 134*  K 4.1 3.9 4.0  CL 106 105 104  CO2 23 23 22   GLUCOSE 116* 115* 121*  BUN 39* 33* 36*  CREATININE 1.94* 1.69* 1.90*  CALCIUM 8.4* 8.6* 8.0*  MG  --  2.1  --   PHOS  --  3.0  --     GFR Estimated Creatinine Clearance: 31.9 mL/min (A) (by C-G formula based on SCr of 1.9 mg/dL (H)). Liver Function Tests: Recent Labs  Lab 10/14/20 2327  AST 23  ALT 7  ALKPHOS 85  BILITOT 0.8  PROT 6.9  ALBUMIN 3.3*    No results for input(s): LIPASE, AMYLASE in the last 168 hours. No results for input(s): AMMONIA in the last 168 hours. Coagulation profile No results for input(s): INR, PROTIME in the last 168 hours. COVID-19 Labs  No results for input(s): DDIMER, FERRITIN, LDH, CRP in the last 72 hours.  Lab Results  Component Value Date   SARSCOV2NAA NEGATIVE 10/14/2020   [place] NEGATIVE 02/16/2020   [place] NEGATIVE 06/16/2018    CBC: Recent Labs  Lab 10/14/20 2327 10/15/20 0349 10/16/20 0507  WBC 6.0 11.0* 12.5*  NEUTROABS 3.7  --   --   HGB 11.6* 12.3* 11.6*  HCT 36.4* 37.2* 34.4*  MCV 96.8 95.6 93.7  PLT 141* 153 131*    Cardiac Enzymes: No results for input(s): CKTOTAL, CKMB, CKMBINDEX, TROPONINI in the last 168  hours. BNP (last 3 results) No results for input(s): PROBNP in the last 8760 hours. CBG: No results for input(s): GLUCAP in the last 168 hours. D-Dimer: No results for input(s): DDIMER in the last 72 hours. Hgb A1c: No results for input(s): HGBA1C in the last 72 hours. Lipid Profile: No results for input(s): CHOL, HDL, LDLCALC, TRIG, CHOLHDL, LDLDIRECT in the last 72 hours. Thyroid function studies: No results for input(s): TSH, T4TOTAL, T3FREE, THYROIDAB in the last 72 hours.  Invalid input(s): FREET3 Anemia work up: No results for input(s): VITAMINB12, FOLATE, FERRITIN, TIBC, IRON, RETICCTPCT in the last 72 hours. Sepsis Labs: Recent Labs  Lab 10/14/20 2327 10/15/20 0349 10/16/20 0507  WBC 6.0 11.0* 12.5*    Microbiology Recent Results (from the past 240 hour(s))  Resp Panel by RT-PCR (Flu A&B, Covid) Nasopharyngeal Swab     Status: None   Collection Time: 10/14/20 11:28 PM   Specimen: Nasopharyngeal Swab; Nasopharyngeal(NP) swabs in vial transport medium  Result Value Ref Range Status  SARS Coronavirus 2 by RT PCR NEGATIVE NEGATIVE Final    Comment: (NOTE) SARS-CoV-2 target nucleic acids are NOT DETECTED.  The SARS-CoV-2 RNA is generally detectable in upper respiratory specimens during the acute phase of infection. The lowest concentration of SARS-CoV-2 viral copies this assay can detect is 138 copies/mL. A negative result does not preclude SARS-Cov-2 infection and should not be used as the sole basis for treatment or other patient management decisions. A negative result may occur with  improper specimen collection/handling, submission of specimen other than nasopharyngeal swab, presence of viral mutation(s) within the areas targeted by this assay, and inadequate number of viral copies(<138 copies/mL). A negative result must be combined with clinical observations, patient history, and epidemiological information. The expected result is Negative.  Fact Sheet for  Patients:  EntrepreneurPulse.com.au  Fact Sheet for Healthcare Providers:  IncredibleEmployment.be  This test is no t yet approved or cleared by the [place] FDA and  has been authorized for detection and/or diagnosis of SARS-CoV-2 by FDA under an Emergency Use Authorization (EUA). This EUA will remain  in effect (meaning this test can be used) for the duration of the COVID-19 declaration under Section 564(b)(1) of the Act, 21 U.S.C.section 360bbb-3(b)(1), unless the authorization is terminated  or revoked sooner.       Influenza A by PCR NEGATIVE NEGATIVE Final   Influenza B by PCR NEGATIVE NEGATIVE Final    Comment: (NOTE) The Xpert Xpress SARS-CoV-2/FLU/RSV plus assay is intended as an aid in the diagnosis of influenza from Nasopharyngeal swab specimens and should not be used as a sole basis for treatment. Nasal washings and aspirates are unacceptable for Xpert Xpress SARS-CoV-2/FLU/RSV testing.  Fact Sheet for Patients: EntrepreneurPulse.com.au  Fact Sheet for Healthcare Providers: IncredibleEmployment.be  This test is not yet approved or cleared by the [place] FDA and has been authorized for detection and/or diagnosis of SARS-CoV-2 by FDA under an Emergency Use Authorization (EUA). This EUA will remain in effect (meaning this test can be used) for the duration of the COVID-19 declaration under Section 564(b)(1) of the Act, 21 U.S.C. section 360bbb-3(b)(1), unless the authorization is terminated or revoked.  Performed at [facility], [place] [address], [place], [place] [address]   Surgical pcr screen     Status: None   Collection Time: 10/15/20  9:19 AM   Specimen: Nasal Mucosa; Nasal Swab  Result Value Ref Range Status   MRSA, PCR NEGATIVE NEGATIVE Final   Staphylococcus aureus NEGATIVE NEGATIVE Final    Comment: (NOTE) The Xpert SA Assay (FDA approved for NASAL specimens in  patients 25 years of age and older), is one component of a comprehensive surveillance program. It is not intended to diagnose infection nor to guide or monitor treatment. Performed at [facility], [place] [address], [place], [place] [address]      Medications:    alfuzosin  10 mg Oral BID   Carbidopa-Levodopa ER  1 tablet Oral QHS   Carbidopa-Levodopa ER  1 tablet Oral TID   clonazePAM  0.5 mg Oral QHS   donepezil  10 mg Oral QHS   enoxaparin (LOVENOX) injection  40 mg Subcutaneous Q24H   finasteride  5 mg Oral Daily   FLUoxetine  10 mg Oral Daily   memantine  10 mg Oral BID   pantoprazole  40 mg Oral Daily   senna-docusate  2 tablet Oral BID   Continuous Infusions:      LOS: 1 day   Bess Harvest  Olevia Bowens  [facility]  10/16/2020, 9:38 AM

## 2020-10-16 NOTE — Evaluation (Signed)
Occupational Therapy Evaluation Patient Details Name: Henry Adams MRN: [id number] DOB: 05-01-1940 Today's Date: 10/16/2020   History of Present Illness Pt is an 80 y.o. male admitted 9/23 following an unwitnessed fall at his SNF. Xray revealed R femoral fx. He underwent R hip hemiarthroplasty (anterior approach) 9/24. PMH significant for Parkinson's disease, peripheral neuropathy with history of B12 deficiency, chronic anxiety/depression, restless leg syndrome on Klonopin, chronic constipation, dementia, and recurrent falls.   Clinical Impression   Henry Adams was evaluated after the above fall and hip surgery. He is from SNF, and per chart notes he ambulated with RW PTA with multiple falls. Currently pt is requiring total A for all of his care, and endorses pain with any movement or weightbearing of RLE.  Attempted 1x sit<>stand with steady frame to assist staff with transfers during admission. Total A for physical assist and cues; pt actively resisting due to pain with increased agitation. Pt would benefit from OT acutely to address deficits and limitations listed below. Recommend d/c to SNF.     Recommendations for follow up therapy are one component of a multi-disciplinary discharge planning process, led by the attending physician.  Recommendations may be updated based on patient status, additional functional criteria and insurance authorization.   Follow Up Recommendations  SNF;Supervision/Assistance - 24 hour    Equipment Recommendations  None recommended by OT       Precautions / Restrictions Precautions Precautions: Fall Restrictions Weight Bearing Restrictions: Yes RLE Weight Bearing: Weight bearing as tolerated      Mobility Bed Mobility Overal bed mobility: Needs Assistance Bed Mobility: Supine to Sit     Supine to sit: Total assist     General bed mobility comments: Pt in chair upon arrival, left in chair at the end of the session    Transfers Overall transfer level:  Needs assistance Equipment used: None Transfers: Sit to/from Stand Sit to Stand: Total assist   Squat pivot transfers: Total assist     General transfer comment: attempted sit<>stand with steady frame; total A to stand, get actively resisting due to pain and did not fully stand into frame    Balance Overall balance assessment: Needs assistance Sitting-balance support: Feet supported;Bilateral upper extremity supported Sitting balance-Leahy Scale: Fair Sitting balance - Comments: static sitting in chair with min guard; pt leaning to L   Standing balance support: Bilateral upper extremity supported;During functional activity Standing balance-Leahy Scale: Zero Standing balance comment: total reliance on therapist during stance                           ADL either performed or assessed with clinical judgement   ADL Overall ADL's : Needs assistance/impaired                   General ADL Comments: pt currently total A for all ADLs and mobility, he has difficuly following simple commands and ultimately needs total A for his care. At this time, he becomes agitated during functional tasks due to pain     Vision   Vision Assessment?: No apparent visual deficits            Pertinent Vitals/Pain Pain Assessment: Faces Faces Pain Scale: Hurts whole lot Pain Location: RLE with movemetn and weightbear Pain Descriptors / Indicators: Grimacing;Guarding;Moaning Pain Intervention(s): Limited activity within patient's tolerance;Monitored during session     Hand Dominance     Extremity/Trunk Assessment Upper Extremity Assessment Upper Extremity Assessment: Generalized weakness (euqal but limited  over head bilateral shoudler ROM; tremors bilaterally)   Lower Extremity Assessment Lower Extremity Assessment: Defer to PT evaluation RLE Deficits / Details: s/p hemiarthroplasty (anterior approach) RLE: Unable to fully assess due to pain   Cervical / Trunk  Assessment Cervical / Trunk Assessment: Kyphotic   Communication Communication Communication: Expressive difficulties   Cognition Arousal/Alertness: Awake/alert Behavior During Therapy: Flat affect;Agitated Overall Cognitive Status: History of cognitive impairments - at baseline                                 General Comments: Dementia at baseline. A&O x 0. Verbal but no appropriate responses to questions. Gibberish and tangential speech.   General Comments  pt pulling at lines and leads throughout, easily re-directed    Exercises Total Joint Exercises Ankle Circles/Pumps: AAROM;Both;10 reps;Supine   Shoulder Instructions      Home Living Family/patient expects to be discharged to:: Skilled nursing facility                                 Additional Comments: Pt unable to provide history. No family present.      Prior Functioning/Environment Level of Independence: Needs assistance        Comments: ambulatory with RW per chart - history of multiple falls        OT Problem List: Decreased strength;Decreased range of motion;Decreased activity tolerance;Decreased safety awareness;Decreased knowledge of precautions;Decreased knowledge of use of DME or AE;Pain;Decreased cognition      OT Treatment/Interventions: Self-care/ADL training;Therapeutic exercise;Therapeutic activities;Patient/family education;Balance training    OT Goals(Current goals can be found in the care plan section) Acute Rehab OT Goals Patient Stated Goal: unable to state OT Goal Formulation: With patient Time For Goal Achievement: 10/30/20 Potential to Achieve Goals: Fair ADL Goals Pt Will Perform Grooming: with mod assist;sitting Pt Will Perform Upper Body Bathing: sitting;with mod assist Pt Will Perform Upper Body Dressing: with mod assist;sitting Pt Will Perform Lower Body Dressing: with max assist;sit to/from stand Pt Will Transfer to Toilet: with max assist;bedside  commode  OT Frequency: Min 2X/week    AM-PAC OT "6 Clicks" Daily Activity     Outcome Measure Help from another person eating meals?: Total Help from another person taking care of personal grooming?: Total Help from another person toileting, which includes using toliet, bedpan, or urinal?: Total Help from another person bathing (including washing, rinsing, drying)?: Total Help from another person to put on and taking off regular upper body clothing?: Total Help from another person to put on and taking off regular lower body clothing?: Total 6 Click Score: 6   End of Session Equipment Utilized During Treatment: Gait belt (staedy frame) Nurse Communication: Mobility status;Precautions;Weight bearing status  Activity Tolerance: Patient limited by pain Patient left: in chair;with call bell/phone within reach;with chair alarm set  OT Visit Diagnosis: Unsteadiness on feet (R26.81);Other abnormalities of gait and mobility (R26.89);Pain;Muscle weakness (generalized) (M62.81);Repeated falls (R29.6);History of falling (Z91.81)                Time: 1009-1030 OT Time Calculation (min): 21 min Charges:  OT General Charges $OT Visit: 1 Visit OT Evaluation $OT Eval Moderate Complexity: 1 Mod   Remmie Bembenek A Jesly Hartmann 10/16/2020, 10:43 AM

## 2020-10-16 NOTE — TOC CAGE-AID Note (Signed)
Transition of Care [facility]) - CAGE-AID Screening   Patient Details  Name: Henry Adams MRN: [id number] Date of Birth: 1940/08/09  [facility] of [facility]) CM/SW Contact:    Clovis Cao, RN Phone Number: [phone] 10/16/2020, 6:20 PM   Clinical Narrative: Pt here from SNF after sustaining an unwitnessed fall.  Pt denies alcohol or drug use.   CAGE-AID Screening:    Have You Ever Felt You Ought to Cut Down on Your Drinking or Drug Use?: No Have People Annoyed You By Critizing Your Drinking Or Drug Use?: No Have You Felt Bad Or Guilty About Your Drinking Or Drug Use?: No Have You Ever Had a Drink or Used Drugs First Thing In The Morning to Steady Your Nerves or to Get Rid of a Hangover?: No CAGE-AID Score: 0  Substance Abuse Education Offered: No

## 2020-10-16 NOTE — Evaluation (Addendum)
Physical Therapy Evaluation Patient Details Name: Henry Adams MRN: [id number] DOB: Oct 03, 1940 Today's Date: 10/16/2020  History of Present Illness  Pt is an 80 y.o. male admitted 9/23 following an unwitnessed fall at his SNF. Xray revealed R femoral fx. He underwent R hip hemiarthroplasty (anterior approach) 9/24. PMH significant for Parkinson's disease, peripheral neuropathy with history of B12 deficiency, chronic anxiety/depression, restless leg syndrome on Klonopin, chronic constipation, dementia, and recurrent falls.   Clinical Impression  Pt admitted with above diagnosis. PTA pt resided in SNF, ambulatory with RW. On eval, pt required total assist bed mobility, and total assist squat pivot transfer. Pt resisting movement and presenting with agitation in response to pain. Pt currently with functional limitations due to the deficits listed below (see PT Problem List). Pt will benefit from skilled PT to increase their independence and safety with mobility to allow discharge to the venue listed below.          Recommendations for follow up therapy are one component of a multi-disciplinary discharge planning process, led by the attending physician.  Recommendations may be updated based on patient status, additional functional criteria and insurance authorization.  Follow Up Recommendations SNF    Equipment Recommendations  Other (comment) (defer to next venue)    Recommendations for Other Services       Precautions / Restrictions Precautions Precautions: Fall Restrictions Weight Bearing Restrictions: Yes RLE Weight Bearing: Weight bearing as tolerated      Mobility  Bed Mobility Overal bed mobility: Needs Assistance Bed Mobility: Supine to Sit     Supine to sit: Total assist     General bed mobility comments: assist with all aspects of mobility, pt maintained flexed position throughout transition to EOB, use of bed pad to scoot to EOB    Transfers Overall transfer level:  Needs assistance Equipment used: None Transfers: Squat Pivot Transfers     Squat pivot transfers: Total assist     General transfer comment: squat pivot transfer bed to recliner toward left  Ambulation/Gait             General Gait Details: unable  Stairs            Wheelchair Mobility    Modified Rankin (Stroke Patients Only)       Balance Overall balance assessment: Needs assistance Sitting-balance support: Feet supported;Bilateral upper extremity supported Sitting balance-Leahy Scale: Fair Sitting balance - Comments: static sit EOB min guard   Standing balance support: Bilateral upper extremity supported;During functional activity Standing balance-Leahy Scale: Zero Standing balance comment: total reliance on therapist during stance                             Pertinent Vitals/Pain Pain Assessment: Faces Faces Pain Scale: Hurts whole lot Pain Location: RLE with mobility Pain Descriptors / Indicators: Grimacing;Guarding;Moaning Pain Intervention(s): Limited activity within patient's tolerance;Repositioned;Monitored during session;Ice applied    Home Living Family/patient expects to be discharged to:: Skilled nursing facility                 Additional Comments: Pt unable to provide history. No family present.    Prior Function           Comments: ambulatory with RW per chart     Hand Dominance        Extremity/Trunk Assessment   Upper Extremity Assessment Upper Extremity Assessment: Defer to OT evaluation    Lower Extremity Assessment Lower Extremity Assessment: RLE deficits/detail  RLE Deficits / Details: s/p hemiarthroplasty (anterior approach) RLE: Unable to fully assess due to pain    Cervical / Trunk Assessment Cervical / Trunk Assessment: Kyphotic  Communication      Cognition Arousal/Alertness: Awake/alert Behavior During Therapy: Flat affect;Agitated (agitated in response to pain) Overall Cognitive  Status: No family/caregiver present to determine baseline cognitive functioning                                 General Comments: Dementia at baseline. A&O x 0. Verbal but no appropriate responses to questions. Gibberish and tangential speech.      General Comments General comments (skin integrity, edema, etc.): Pt attempting to pull off condom cath, multiple time. Able to redirect and place blanket over lap.    Exercises Total Joint Exercises Ankle Circles/Pumps: AAROM;Both;10 reps;Supine   Assessment/Plan    PT Assessment Patient needs continued PT services  PT Problem List Decreased strength;Decreased mobility;Decreased safety awareness;Decreased knowledge of precautions;Decreased activity tolerance;Decreased cognition;Pain;Decreased balance;Decreased knowledge of use of DME       PT Treatment Interventions DME instruction;Therapeutic activities;Gait training;Therapeutic exercise;Patient/family education;Balance training;Functional mobility training    PT Goals (Current goals can be found in the Care Plan section)  Acute Rehab PT Goals Patient Stated Goal: unable to state PT Goal Formulation: Patient unable to participate in goal setting Time For Goal Achievement: 10/30/20 Potential to Achieve Goals: Fair    Frequency Min 3X/week   Barriers to discharge        Co-evaluation               AM-PAC PT "6 Clicks" Mobility  Outcome Measure Help needed turning from your back to your side while in a flat bed without using bedrails?: Total Help needed moving from lying on your back to sitting on the side of a flat bed without using bedrails?: Total Help needed moving to and from a bed to a chair (including a wheelchair)?: Total Help needed standing up from a chair using your arms (e.g., wheelchair or bedside chair)?: Total Help needed to walk in hospital room?: Total Help needed climbing 3-5 steps with a railing? : Total 6 Click Score: 6    End of Session  Equipment Utilized During Treatment: Gait belt Activity Tolerance: Patient limited by pain Patient left: in chair;with call bell/phone within reach;with chair alarm set Nurse Communication: Mobility status PT Visit Diagnosis: Other abnormalities of gait and mobility (R26.89);Pain;History of falling (Z91.81) Pain - Right/Left: Right Pain - part of body: Leg    Time: 1552-0802 PT Time Calculation (min) (ACUTE ONLY): 19 min   Charges:   PT Evaluation $PT Eval Moderate Complexity: 1 Mod          Lorrin Goodell, PT  Office # [phone] Pager [phone]   Lorriane Shire 10/16/2020, 10:12 AM

## 2020-10-16 NOTE — Plan of Care (Signed)
  Problem: Education: Goal: Verbalization of understanding the information provided (i.e., activity precautions, restrictions, etc) will improve Outcome: Not Progressing Goal: Individualized Educational Video(s) Outcome: Progressing   Problem: Activity: Goal: Ability to ambulate and perform ADLs will improve Outcome: Not Progressing   Problem: Clinical Measurements: Goal: Postoperative complications will be avoided or minimized Outcome: Progressing   Problem: Self-Concept: Goal: Ability to maintain and perform role responsibilities to the fullest extent possible will improve Outcome: Not Progressing   Problem: Pain Management: Goal: Pain level will decrease Outcome: Progressing   Problem: Education: Goal: Knowledge of General Education information will improve Description: Including pain rating scale, medication(s)/side effects and non-pharmacologic comfort measures Outcome: Not Progressing   Problem: Health Behavior/Discharge Planning: Goal: Ability to manage health-related needs will improve Outcome: Not Progressing   Problem: Clinical Measurements: Goal: Ability to maintain clinical measurements within normal limits will improve Outcome: Progressing Goal: Will remain free from infection Outcome: Progressing Goal: Diagnostic test results will improve Outcome: Progressing Goal: Respiratory complications will improve Outcome: Progressing Goal: Cardiovascular complication will be avoided Outcome: Progressing   Problem: Activity: Goal: Risk for activity intolerance will decrease Outcome: Progressing   Problem: Nutrition: Goal: Adequate nutrition will be maintained Outcome: Progressing   Problem: Coping: Goal: Level of anxiety will decrease Outcome: Progressing   Problem: Elimination: Goal: Will not experience complications related to bowel motility Outcome: Progressing Goal: Will not experience complications related to urinary retention Outcome: Progressing    Problem: Pain Managment: Goal: General experience of comfort will improve Outcome: Progressing   Problem: Safety: Goal: Ability to remain free from injury will improve Outcome: Progressing   Problem: Skin Integrity: Goal: Risk for impaired skin integrity will decrease Outcome: Progressing    Report received and care assumed from previous shift RN. VS obtained, shift assessments completed - see flowsheets. PRN pain medications given as needed, see MAR; patient denies need for further intervention. Turned and repositioned. Condom catheter in place. R hip dressing remains CDI, ice to site. Daughter in room overnight as patient is confused and fidgety. Patient currently resting in bed, bed in lowest position. Denies needs. Call bell within reach. Bedalarm in use at all times.

## 2020-10-17 DIAGNOSIS — S7291XP Unspecified fracture of right femur, subsequent encounter for closed fracture with malunion: Secondary | ICD-10-CM | POA: Diagnosis not present

## 2020-10-17 DIAGNOSIS — W19XXXA Unspecified fall, initial encounter: Secondary | ICD-10-CM | POA: Diagnosis not present

## 2020-10-17 DIAGNOSIS — S72001A Fracture of unspecified part of neck of right femur, initial encounter for closed fracture: Secondary | ICD-10-CM | POA: Diagnosis not present

## 2020-10-17 LAB — BASIC METABOLIC PANEL
Anion gap: 9 (ref 5–15)
BUN: 51 mg/dL — ABNORMAL HIGH (ref 8–23)
CO2: 22 mmol/L (ref 22–32)
Calcium: 8.1 mg/dL — ABNORMAL LOW (ref 8.9–10.3)
Chloride: 105 mmol/L (ref 98–111)
Creatinine, Ser: 1.99 mg/dL — ABNORMAL HIGH (ref 0.61–1.24)
GFR, Estimated: 33 mL/min — ABNORMAL LOW (ref >60)
Glucose, Bld: 120 mg/dL — ABNORMAL HIGH (ref 70–99)
Potassium: 3.5 mmol/L (ref 3.5–5.1)
Sodium: 136 mmol/L (ref 135–145)

## 2020-10-17 MED ORDER — HYDROCODONE-ACETAMINOPHEN 5-325 MG PO TABS
1.0000 | ORAL_TABLET | Freq: Four times a day (QID) | ORAL | Status: DC | PRN
Start: 2020-10-17 — End: 2020-10-19
  Administered 2020-10-19: 1 via ORAL
  Filled 2020-10-17: qty 1

## 2020-10-17 MED ORDER — CARBIDOPA-LEVODOPA ER 25-100 MG PO TBCR
1.0000 | EXTENDED_RELEASE_TABLET | ORAL | Status: DC
  Administered 2020-10-17 – 2020-10-18 (×4): 1 via ORAL
  Filled 2020-10-17 (×4): qty 1

## 2020-10-17 MED ORDER — CARBIDOPA-LEVODOPA ER 25-100 MG PO TBCR
1.5000 | EXTENDED_RELEASE_TABLET | Freq: Every evening | ORAL | Status: DC
  Administered 2020-10-17 – 2020-10-18 (×2): 1.5 via ORAL
  Filled 2020-10-17 (×3): qty 1.5

## 2020-10-17 MED ORDER — SODIUM CHLORIDE 0.9 % IV SOLN: INTRAVENOUS | Status: DC

## 2020-10-17 MED ORDER — SODIUM CHLORIDE 0.9 % IV SOLN: INTRAVENOUS | Status: AC

## 2020-10-17 NOTE — Progress Notes (Signed)
   Subjective:  Patient reports pain is well controlled.  Was able to get up to the side of bed with physical therapy.  Objective:   VITALS:   Vitals:   10/15/20 2223 10/16/20 0028 10/16/20 1516 10/16/20 2100  BP: 121/83 127/67 140/85 135/82  Pulse: 66 74 74 75  Resp: 15 16  18   Temp: 98.3 F (36.8 C)  99.5 F (37.5 C) 99.5 F (37.5 C)  TempSrc: Oral  Oral Oral  SpO2: 94% 92% 91% 95%  Weight:      Height:       Right hip dressing is clean and dry.  Limb lengths are equal.  He is able to extend and flex at the ankle without difficulty.  All of his toes are warm and well-perfused.   Lab Results  Component Value Date   WBC 12.5 (H) 10/16/2020   HGB 11.6 (L) 10/16/2020   HCT 34.4 (L) 10/16/2020   MCV 93.7 10/16/2020   PLT 131 (L) 10/16/2020     Assessment/Plan:  2 Days Post-Op status post right hip hemiarthroplasty  - Expected postop acute blood loss anemia - will monitor for symptoms - Patient to work with PT/OT to optimize mobilization safely - DVT ppx - SCDs, ambulation, Lovenox - Postoperative Abx: Ancef x 2 additional doses given - WBAT operative extremity - Pain control - multimodal pain management, ATC acetaminophen in conjunction with as needed narcotic (oxycodone), although this should be minimized with other modalities  - Discharge planning pending CM, appreciate coordination    Henry Adams 10/17/2020, 7:41 AM

## 2020-10-17 NOTE — NC FL2 (Signed)
[place] LEVEL OF CARE SCREENING TOOL     IDENTIFICATION  Patient Name: Henry Adams Birthdate: April 18, 1940 Sex: male Admission Date (Current Location): 10/14/2020  [facility] and [place] Number:  Herbalist and Address:  [facility] [place] [address], [place], [place] [address]      Provider Number: [id number]  Attending Physician Name and Address:  Charlynne Cousins, MD  Relative Name and Phone Number:  Hetty Ely (Daughter)   [phone]    Current Level of Care:   Recommended Level of Care: [place] Prior Approval Number:    Date Approved/Denied:   PASRR Number: [id number]  Discharge Plan: SNF    Current Diagnoses: Patient Active Problem List   Diagnosis Date Noted   Right femoral fracture ([place]) 10/15/2020   Tinea corporis 02/11/2020   Allergic contact dermatitis due to drugs in contact with skin 02/11/2020   Acute bronchitis due to COVID-19 virus 02/05/2020   Traumatic coccydynia 10/05/2019   Pressure injury of buttock, stage 1 10/05/2019   Irritable bowel syndrome with both constipation and diarrhea 08/18/2019   Dysuria 08/18/2019   Epigastric abdominal tenderness without rebound tenderness 05/21/2019   Psychophysiological insomnia 04/10/2019   Benign prostatic hyperplasia with urinary hesitancy 06/25/2018   Parkinsonism ([place])    UTI (urinary tract infection) 06/16/2018   CRI (chronic renal insufficiency), stage 3 (moderate) ([place]) 03/27/2018   Restless leg syndrome 01/23/2018   Calculus of gallbladder with chronic cholecystitis without obstruction 09/18/2017   Chronic idiopathic constipation 08/19/2017   BPH associated with nocturia 08/11/2015   Vocal cord atrophy 09/01/2013   Essential hypertension 04/26/2007   Allergic rhinitis 04/26/2007   DEGENERATIVE JOINT DISEASE 04/26/2007   G E R D 08/05/2006   Hyperlipidemia with target LDL less than 160 05/30/2006   Polycystic kidney 05/30/2006     Orientation RESPIRATION BLADDER Height & Weight     Self  Normal Incontinent, External catheter Weight: 170 lb (77.1 kg) Height:  5\' 11"  (180.3 cm)  BEHAVIORAL SYMPTOMS/MOOD NEUROLOGICAL BOWEL NUTRITION STATUS      Continent Diet (see d/c summary)  AMBULATORY STATUS COMMUNICATION OF NEEDS Skin   Total Care Verbally Surgical wounds                       Personal Care Assistance Level of Assistance  Bathing, Feeding, Dressing Bathing Assistance: Limited assistance Feeding assistance: Independent Dressing Assistance: Limited assistance     Functional Limitations Info  Sight, Hearing, Speech Sight Info: Impaired Hearing Info: Impaired Speech Info: Adequate    SPECIAL CARE FACTORS FREQUENCY                       Contractures Contractures Info: Not present    Additional Factors Info  Code Status, Allergies Code Status Info: Full Allergies Info: Iodine   Oxycodone   Melatonin   Pollen Extract   Tamiflu (Oseltamivir Phosphate)   Tamsulosin   Doxazosin   Etodolac   Latex   Tape           Current Medications (10/17/2020):  This is the current hospital active medication list Current Facility-Administered Medications  Medication Dose Route Frequency Provider Last Rate Last Admin   acetaminophen (TYLENOL) tablet 650 mg  650 mg Oral Q6H PRN Vanetta Mulders, MD   650 mg at 10/16/20 1656   alfuzosin (UROXATRAL) 24 hr tablet 10 mg  10 mg Oral BID Vanetta Mulders, MD   10  mg at 10/17/20 0858   bisacodyl (DULCOLAX) EC tablet 5 mg  5 mg Oral Daily PRN Lovey Newcomer T, NP       Carbidopa-Levodopa ER (SINEMET CR) 25-100 MG tablet controlled release 1 tablet  1 tablet Oral QHS Blount, Scarlette Shorts T, NP   1 tablet at 10/16/20 2208   Carbidopa-Levodopa ER (SINEMET CR) 25-100 MG tablet controlled release 1 tablet  1 tablet Oral TID Lovey Newcomer T, NP   1 tablet at 10/17/20 0858   carboxymethylcellulose (REFRESH PLUS) 0.5 % ophthalmic solution 1 drop  1 drop Both Eyes TID PRN Vanetta Mulders, MD       clonazePAM [facility] [place]) tablet 0.5 mg  0.5 mg Oral QHS Vanetta Mulders, MD   0.5 mg at 10/16/20 2208   donepezil (ARICEPT) tablet 10 mg  10 mg Oral QHS Vanetta Mulders, MD   10 mg at 10/16/20 2208   enoxaparin (LOVENOX) injection 40 mg  40 mg Subcutaneous Q24H Charlynne Cousins, MD   40 mg at 10/17/20 0858   finasteride (PROSCAR) tablet 5 mg  5 mg Oral Daily Vanetta Mulders, MD   5 mg at 10/17/20 0858   FLUoxetine (PROZAC) capsule 10 mg  10 mg Oral Daily Vanetta Mulders, MD   10 mg at 10/17/20 0858   haloperidol lactate (HALDOL) injection 1 mg  1 mg Intravenous Q6H PRN Charlynne Cousins, MD       hydrALAZINE (APRESOLINE) injection 5 mg  5 mg Intravenous Q6H PRN Vanetta Mulders, MD   5 mg at 10/15/20 1213   HYDROmorphone (DILAUDID) injection 0.5 mg  0.5 mg Intravenous Q4H PRN Vanetta Mulders, MD   0.5 mg at 10/15/20 1206   influenza vaccine adjuvanted (FLUAD) injection 0.5 mL  0.5 mL Intramuscular Prior to discharge Aileen Fass, Tammi Klippel, MD       melatonin tablet 3 mg  3 mg Oral QHS Charlynne Cousins, MD       memantine [facility]) tablet 10 mg  10 mg Oral BID Vanetta Mulders, MD   10 mg at 10/17/20 0858   naloxone [facility]) injection 0.4 mg  0.4 mg Intravenous PRN Vanetta Mulders, MD       ondansetron [facility]) injection 4 mg  4 mg Intravenous Q6H PRN Vanetta Mulders, MD       pantoprazole (PROTONIX) EC tablet 40 mg  40 mg Oral Daily Vanetta Mulders, MD   40 mg at 10/17/20 9211   senna-docusate (Senokot-S) tablet 2 tablet  2 tablet Oral BID Vanetta Mulders, MD   2 tablet at 10/17/20 9417     Discharge Medications: Please see discharge summary for a list of discharge medications.  Relevant Imaging Results:  Relevant Lab Results:   Additional Information SSN:  [id number]; [place] COVID-19 Vaccine 11/09/2019 , 02/23/2019 , 02/03/2019 (daughter reports that the patient has both boosters);  5'11" 170lbs  Shikha Bibb F Marquitta Persichetti, LCSWA

## 2020-10-17 NOTE — TOC Initial Note (Signed)
Transition of Care [facility]) - Initial/Assessment Note    Patient Details  Name: Henry Adams MRN: [id number] Date of Birth: Jan 11, 1941  [facility]) CM/SW Contact:    Milinda Antis, [place] Phone Number: 10/17/2020, 10:23 AM  Clinical Narrative:                  CSW received consult for possible SNF placement at time of discharge. CSW spoke with patient's daughter, [name], due to the patient having dementia. The daughter informed CSW that the patient is from an assisted living facility, [facility], and the facility does not have a SNF.  Patient's daughter expressed understanding of PT recommendation and is agreeable to SNF placement at time of discharge. Daughter reports preference for [facility].  The plan is for the patient to return to the assisted living facility after rehab.  CSW discussed insurance authorization process and will provide Medicare SNF ratings list. Patient has received 4 COVID vaccines. No further questions reported at this time.   Skilled Nursing Rehab Facilities-   [email or web address] Ratings out of 5 possible    Name Address  Phone # [place] Overall  [facility] [address], [place] 5 1 4 4   [facility]  [place], [place] [phone] 3 1 5 4   [facility] [place], [place] 3 1 1 1   [place], [place] 2 2 4 4   [facility] [address], [place] 3 1 2 1   [place]. [place] 3 2 4 4   [facility] [address] [place] 5 1 2 2   [facility] [address], [place] 5 2 2 3   [facility] [place] 5 1 2 2   [facility] [phone], [place] [phone] 5 1 2 2   [facility] [address],  [facility] [phone] 5 1 2 2   [place] [phone] 3 1 1 1           Patient Goals and CMS Choice        Expected Discharge Plan and Services                                                Prior Living Arrangements/Services                       Activities of Daily Living Home Assistive Devices/Equipment: [name] (specify quad or straight) ADL Screening (condition at time of admission) Patient's cognitive ability adequate to safely complete daily activities?: Yes Is the patient deaf or have difficulty hearing?: Yes Does the patient have difficulty seeing, even when wearing glasses/contacts?: No Does the patient have difficulty concentrating, remembering, or making decisions?: No Patient able to express need for assistance with ADLs?: Yes Does the patient have difficulty dressing or bathing?: No Independently performs ADLs?: No Communication: Independent Dressing (OT): Needs assistance Is this a change from baseline?: Pre-admission baseline Grooming: Independent Feeding: Independent Bathing: Needs assistance Is this a change from baseline?: Pre-admission baseline Toileting: Needs assistance Is this a change from baseline?: Pre-admission baseline In/Out Bed: Independent Walks in Home: Independent Does the patient have difficulty walking or climbing stairs?: Yes  Weakness of Legs: Both Weakness of Arms/Hands: None  Permission Sought/Granted                  Emotional Assessment              Admission diagnosis:  Fall [W19.XXXA] Right femoral fracture ([place]) [S72.91XA] Fall, initial encounter [W19.XXXA] Closed fracture of right hip, initial encounter ([place]) [S72.001A] Patient Active Problem List   Diagnosis Date Noted   Right femoral fracture ([place]) 10/15/2020   Tinea corporis 02/11/2020   Allergic contact dermatitis due to drugs in contact with skin 02/11/2020   Acute bronchitis due to  COVID-19 virus 02/05/2020   Traumatic coccydynia 10/05/2019   Pressure injury of buttock, stage 1 10/05/2019   Irritable bowel syndrome with both constipation and diarrhea 08/18/2019   Dysuria 08/18/2019   Epigastric abdominal tenderness without rebound tenderness 05/21/2019   Psychophysiological insomnia 04/10/2019   Benign prostatic hyperplasia with urinary hesitancy 06/25/2018   Parkinsonism ([place])    UTI (urinary tract infection) 06/16/2018   CRI (chronic renal insufficiency), stage 3 (moderate) ([place]) 03/27/2018   Restless leg syndrome 01/23/2018   Calculus of gallbladder with chronic cholecystitis without obstruction 09/18/2017   Chronic idiopathic constipation 08/19/2017   BPH associated with nocturia 08/11/2015   Vocal cord atrophy 09/01/2013   Essential hypertension 04/26/2007   Allergic rhinitis 04/26/2007   DEGENERATIVE JOINT DISEASE 04/26/2007   G E R D 08/05/2006   Hyperlipidemia with target LDL less than 160 05/30/2006   Polycystic kidney 05/30/2006   PCP:  [name], MD Pharmacy:  No Pharmacies Listed    Social Determinants of Health (SDOH) Interventions    Readmission Risk Interventions Readmission Risk Prevention Plan 06/17/2018  Transportation Screening Complete  Home Care Screening Complete  Some recent data might be hidden

## 2020-10-17 NOTE — Plan of Care (Signed)

## 2020-10-17 NOTE — Discharge Instructions (Addendum)
     Discharge Instructions    Attending Surgeon: Vanetta Mulders, MD Office Phone Number: [phone]   Diagnosis and Procedures:    Surgeries Performed: Right hip hemiarthroplasty   Discharge Plan:    Diet: Resume usual diet. Begin with light or bland foods.  Drink plenty of fluids.  Activity:  Weight bearing as tolerate, right anterior hip precautions  GENERAL INSTRUCTIONS: 1.  Keep your surgical site elevated above your heart for at least 5-7 days or longer to prevent swelling. This will improve your comfort and your overall recovery following surgery.     2. Please call Dr. Eddie Dibbles office at [phone] with questions Monday-Friday during business hours. If no one answers, please leave a message and someone should get back to the patient within 24 hours. For emergencies please call 911 or proceed to the emergency room.   3. Patient to notify surgical team if experiences any of the following: Bowel/Bladder dysfunction, uncontrolled pain, nerve/muscle weakness, incision with increased drainage or redness, nausea/vomiting and Fever greater than 101.0 F.  Be alert for signs of infection including redness, streaking, odor, fever or chills. Be alert for excessive pain or bleeding and notify your surgeon immediately.  WOUND INSTRUCTIONS:   Leave your dressing in place until your post operative visit.  Keep it clean and dry.  Always keep the incision clean and dry until the staples/sutures are removed. If there is no drainage from the incision you should keep it open to air. If there is drainage from the incision you must keep it covered at all times until the drainage stops  Do not soak in a bath tub, hot tub, pool, lake or other body of water until 21 days after your surgery and your incision is completely dry and healed.  If you have removable sutures (or staples) they must be removed 10-14 days (unless otherwise instructed) from the day of your surgery.     1)  Elevate the  extremity as much as possible.  2)  Keep the dressing clean and dry.  3)  Please call us if the dressing becomes wet or dirty.  4)  If you are experiencing worsening pain or worsening swelling, please call.     MEDICATIONS: Resume all previous home medications at the previous prescribed dose and frequency unless otherwise noted Start taking the  pain medications on an as-needed basis as prescribed  Please taper down pain medication over the next week following surgery.  Ideally you should not require a refill of any narcotic pain medication.  Take pain medication with food to minimize nausea. In addition to the prescribed pain medication, you may take over-the-counter pain relievers such as Tylenol.  Do NOT take additional tylenol if your pain medication already has tylenol in it.  Lovenox 40mg  daily      FOLLOWUP INSTRUCTIONS: 1. Follow up at the [facility] Therapy [facility] 3-4 days following surgery. This appointment should be scheduled unless other arrangements have been made.The Physical Therapy scheduling number is [phone] if an appointment has not already been arranged.  2. Contact Dr. Eddie Dibbles office during office hours at [phone] or the practice after hours line at [phone] for non-emergencies. For medical emergencies call 911.

## 2020-10-17 NOTE — Progress Notes (Signed)
[facility] PROGRESS NOTE    Progress Note  DALESSANDRO BALDYGA  DTO:[id number] DOB: 25-May-1940 DOA: 10/14/2020 PCP: [name], MD     Brief Narrative:   Henry Adams is an 80 y.o. male past medical history significant for Parkinson's disease, peripheral neuropathy with history of B12 deficiency chronic anxiety restless leg syndrome on Klonopin, chronic constipation dementia recurrent falls presents to the ED after an unwitnessed fall complaining of right hip pain work-up in the ED revealed displaced right femoral fracture orthopedic surgery was consulted.  Assessment/Plan:   Acute displaced right femoral neck fracture due to mechanical fall: Orthopedic surgery has been consulted. He is status post right hip hemiarthroplasty. Physical therapy evaluated the patient recommended skilled nursing facility. Pain and anticoagulation per orthopedic.  Mild leukocytosis:  Likely due to reactive acute fracture.  Continues to remain afebrile.  Chronic kidney disease stage IIIb: Mild increase in his creatinine, his baseline is usually around 1.4-1.7, this morning 1.9. Start on gentle IV fluids recheck a basic metabolic panel.  Essential hypertension uncontrolled: Blood pressure today is improved continue current regimen.  Parkinson's disease/dementia: Changes carbidopa 2 every 4 hours as per family member that is how he takes it at home during daytime. Continue Namenda and Aricept. He is at high risk of delirium and aspiration.  Chronic anxiety/depression/restless leg syndrome: Continue Prozac and benzodiazepines. She is in significant risk of aspiration and delirium. Her QTC is preserved. Will use Haldol IV as needed melatonin at bedtime.  BPH: Continue Proscar and alfuzosin.  Chronic constipation: Continue MiraLAX    DVT prophylaxis: lovenox Family Communication:none Status is: Inpatient  Remains inpatient appropriate because:Hemodynamically  unstable  Dispo:  Patient From: [place]  Planned Disposition: [place]  Medically stable for discharge: No   Code Status:     Code Status Orders  (From admission, onward)           Start     Ordered   10/15/20 0228  Full code  Continuous        10/15/20 0228           Code Status History     Date Active Date Inactive Code Status Order ID Comments User Context   06/16/2018 2333 06/19/2018 2206 Full Code [id number]  Ivor Costa, MD ED         IV Access:   Peripheral IV   Procedures and diagnostic studies:   DG Pelvis 1-2 Views  Result Date: 10/15/2020 CLINICAL DATA:  Postop EXAM: PELVIS - 1-2 VIEW COMPARISON:  X-ray intraoperative right hip 10/15/2020 FINDINGS: Status post total right hip arthroplasty. There is no evidence of pelvic fracture or diastasis. No pelvic bone lesions are seen. Right hip subcutaneus soft tissue edema and emphysema consistent with postsurgical changes. IMPRESSION: Status post total right hip arthroplasty. Electronically Signed   By: Iven Finn M.D.   On: 10/15/2020 19:09   DG Pelvis 1-2 Views  Result Date: 10/15/2020 CLINICAL DATA:  Postop right hip arthroplasty. EXAM: PELVIS - 1 VIEW COMPARISON:  10/05/2019 FINDINGS: Single view shows a right hip hemiarthroplasty that appears well seated and aligned. Two hemostats project above the arthroplasty, a third medial. IMPRESSION: Single portable operative image shows a well-positioned right hip hemiarthroplasty. Electronically Signed   By: Lajean Manes M.D.   On: 10/15/2020 15:27     Medical Consultants:   None.   Subjective:    Seward Speck nonverbal, very jittery  today  Objective:    Vitals:  10/16/20 0028 10/16/20 1516 10/16/20 2100 10/17/20 0800  BP: 127/67 140/85 135/82 115/74  Pulse: 74 74 75 71  Resp: 16  18 19   Temp:  99.5 F (37.5 C) 99.5 F (37.5 C) 99.1 F (37.3 C)  TempSrc:  Oral Oral Axillary  SpO2: 92% 91% 95% 96%   Weight:      Height:       SpO2: 96 % O2 Flow Rate (L/min): 1 L/min   Intake/Output Summary (Last 24 hours) at 10/17/2020 1206 Last data filed at 10/17/2020 0700 Gross per 24 hour  Intake --  Output 600 ml  Net -600 ml    Filed Weights   10/14/20 2316  Weight: 77.1 kg    Exam: General exam: In no acute distress. Respiratory system: Good air movement and clear to auscultation. Cardiovascular system: S1 & S2 heard, RRR. No JVD. Gastrointestinal system: Abdomen is nondistended, soft and nontender.  Extremities: No pedal edema. Skin: No rashes, lesions or ulcers  Data Reviewed:    Labs: Basic Metabolic Panel: Recent Labs  Lab 10/14/20 2327 10/15/20 0349 10/16/20 0507  NA 136 136 134*  K 4.1 3.9 4.0  CL 106 105 104  CO2 23 23 22   GLUCOSE 116* 115* 121*  BUN 39* 33* 36*  CREATININE 1.94* 1.69* 1.90*  CALCIUM 8.4* 8.6* 8.0*  MG  --  2.1  --   PHOS  --  3.0  --     GFR Estimated Creatinine Clearance: 31.9 mL/min (A) (by C-G formula based on SCr of 1.9 mg/dL (H)). Liver Function Tests: Recent Labs  Lab 10/14/20 2327  AST 23  ALT 7  ALKPHOS 85  BILITOT 0.8  PROT 6.9  ALBUMIN 3.3*    No results for input(s): LIPASE, AMYLASE in the last 168 hours. No results for input(s): AMMONIA in the last 168 hours. Coagulation profile No results for input(s): INR, PROTIME in the last 168 hours. COVID-19 Labs  No results for input(s): DDIMER, FERRITIN, LDH, CRP in the last 72 hours.  Lab Results  Component Value Date   SARSCOV2NAA NEGATIVE 10/14/2020   [place] NEGATIVE 02/16/2020   [place] NEGATIVE 06/16/2018    CBC: Recent Labs  Lab 10/14/20 2327 10/15/20 0349 10/16/20 0507  WBC 6.0 11.0* 12.5*  NEUTROABS 3.7  --   --   HGB 11.6* 12.3* 11.6*  HCT 36.4* 37.2* 34.4*  MCV 96.8 95.6 93.7  PLT 141* 153 131*    Cardiac Enzymes: No results for input(s): CKTOTAL, CKMB, CKMBINDEX, TROPONINI in the last 168 hours. BNP (last 3 results) No results  for input(s): PROBNP in the last 8760 hours. CBG: No results for input(s): GLUCAP in the last 168 hours. D-Dimer: No results for input(s): DDIMER in the last 72 hours. Hgb A1c: No results for input(s): HGBA1C in the last 72 hours. Lipid Profile: No results for input(s): CHOL, HDL, LDLCALC, TRIG, CHOLHDL, LDLDIRECT in the last 72 hours. Thyroid function studies: No results for input(s): TSH, T4TOTAL, T3FREE, THYROIDAB in the last 72 hours.  Invalid input(s): FREET3 Anemia work up: No results for input(s): VITAMINB12, FOLATE, FERRITIN, TIBC, IRON, RETICCTPCT in the last 72 hours. Sepsis Labs: Recent Labs  Lab 10/14/20 2327 10/15/20 0349 10/16/20 0507  WBC 6.0 11.0* 12.5*    Microbiology Recent Results (from the past 240 hour(s))  Resp Panel by RT-PCR (Flu A&B, Covid) Nasopharyngeal Swab     Status: None   Collection Time: 10/14/20 11:28 PM   Specimen: Nasopharyngeal Swab; Nasopharyngeal(NP) swabs in vial transport medium  Result Value Ref Range Status   SARS Coronavirus 2 by RT PCR NEGATIVE NEGATIVE Final    Comment: (NOTE) SARS-CoV-2 target nucleic acids are NOT DETECTED.  The SARS-CoV-2 RNA is generally detectable in upper respiratory specimens during the acute phase of infection. The lowest concentration of SARS-CoV-2 viral copies this assay can detect is 138 copies/mL. A negative result does not preclude SARS-Cov-2 infection and should not be used as the sole basis for treatment or other patient management decisions. A negative result may occur with  improper specimen collection/handling, submission of specimen other than nasopharyngeal swab, presence of viral mutation(s) within the areas targeted by this assay, and inadequate number of viral copies(<138 copies/mL). A negative result must be combined with clinical observations, patient history, and epidemiological information. The expected result is Negative.  Fact Sheet for Patients:   EntrepreneurPulse.com.au  Fact Sheet for Healthcare Providers:  IncredibleEmployment.be  This test is no t yet approved or cleared by the [place] FDA and  has been authorized for detection and/or diagnosis of SARS-CoV-2 by FDA under an Emergency Use Authorization (EUA). This EUA will remain  in effect (meaning this test can be used) for the duration of the COVID-19 declaration under Section 564(b)(1) of the Act, 21 U.S.C.section 360bbb-3(b)(1), unless the authorization is terminated  or revoked sooner.       Influenza A by PCR NEGATIVE NEGATIVE Final   Influenza B by PCR NEGATIVE NEGATIVE Final    Comment: (NOTE) The Xpert Xpress SARS-CoV-2/FLU/RSV plus assay is intended as an aid in the diagnosis of influenza from Nasopharyngeal swab specimens and should not be used as a sole basis for treatment. Nasal washings and aspirates are unacceptable for Xpert Xpress SARS-CoV-2/FLU/RSV testing.  Fact Sheet for Patients: EntrepreneurPulse.com.au  Fact Sheet for Healthcare Providers: IncredibleEmployment.be  This test is not yet approved or cleared by the [place] FDA and has been authorized for detection and/or diagnosis of SARS-CoV-2 by FDA under an Emergency Use Authorization (EUA). This EUA will remain in effect (meaning this test can be used) for the duration of the COVID-19 declaration under Section 564(b)(1) of the Act, 21 U.S.C. section 360bbb-3(b)(1), unless the authorization is terminated or revoked.  Performed at [facility], [place] [address] [place], [place] [address]   Surgical pcr screen     Status: None   Collection Time: 10/15/20  9:19 AM   Specimen: Nasal Mucosa; Nasal Swab  Result Value Ref Range Status   MRSA, PCR NEGATIVE NEGATIVE Final   Staphylococcus aureus NEGATIVE NEGATIVE Final    Comment: (NOTE) The Xpert SA Assay (FDA approved for NASAL specimens in patients  82 years of age and older), is one component of a comprehensive surveillance program. It is not intended to diagnose infection nor to guide or monitor treatment. Performed at [facility], [place] [address] [place], [place] [address]   Urine Culture     Status: Abnormal   Collection Time: 10/15/20 12:02 PM   Specimen: Urine, Clean Catch  Result Value Ref Range Status   Specimen Description URINE, CLEAN CATCH  Final   Special Requests NONE  Final   Culture (A)  Final    <10,000 COLONIES/mL INSIGNIFICANT GROWTH Performed at [facility], [place] [address] [place],  [address]    Report Status 10/16/2020 FINAL  Final     Medications:    alfuzosin  10 mg Oral BID   Carbidopa-Levodopa ER  1 tablet Oral QHS   Carbidopa-Levodopa ER  1 tablet Oral TID   clonazePAM  0.5 mg Oral QHS   donepezil  10 mg Oral QHS   enoxaparin (LOVENOX) injection  40 mg Subcutaneous Q24H   finasteride  5 mg Oral Daily   FLUoxetine  10 mg Oral Daily   melatonin  3 mg Oral QHS   memantine  10 mg Oral BID   pantoprazole  40 mg Oral Daily   senna-docusate  2 tablet Oral BID   Continuous Infusions:      LOS: 2 days   Charlynne Cousins  [facility]  10/17/2020, 12:06 PM

## 2020-10-18 DIAGNOSIS — S7291XP Unspecified fracture of right femur, subsequent encounter for closed fracture with malunion: Secondary | ICD-10-CM | POA: Diagnosis not present

## 2020-10-18 DIAGNOSIS — S72001A Fracture of unspecified part of neck of right femur, initial encounter for closed fracture: Secondary | ICD-10-CM | POA: Diagnosis not present

## 2020-10-18 DIAGNOSIS — W19XXXA Unspecified fall, initial encounter: Secondary | ICD-10-CM | POA: Diagnosis not present

## 2020-10-18 LAB — BASIC METABOLIC PANEL
Anion gap: 8 (ref 5–15)
BUN: 49 mg/dL — ABNORMAL HIGH (ref 8–23)
CO2: 20 mmol/L — ABNORMAL LOW (ref 22–32)
Calcium: 8.1 mg/dL — ABNORMAL LOW (ref 8.9–10.3)
Chloride: 108 mmol/L (ref 98–111)
Creatinine, Ser: 1.77 mg/dL — ABNORMAL HIGH (ref 0.61–1.24)
GFR, Estimated: 38 mL/min — ABNORMAL LOW (ref >60)
Glucose, Bld: 103 mg/dL — ABNORMAL HIGH (ref 70–99)
Potassium: 3.8 mmol/L (ref 3.5–5.1)
Sodium: 136 mmol/L (ref 135–145)

## 2020-10-18 LAB — RESP PANEL BY RT-PCR (FLU A&B, COVID) ARPGX2
Influenza A by PCR: NEGATIVE
Influenza B by PCR: NEGATIVE
SARS Coronavirus 2 by RT PCR: NEGATIVE

## 2020-10-18 MED ORDER — POLYETHYLENE GLYCOL 3350 17 G PO PACK
17.0000 g | PACK | Freq: Two times a day (BID) | ORAL | Status: DC
  Administered 2020-10-18 – 2020-10-19 (×3): 17 g via ORAL
  Filled 2020-10-18 (×3): qty 1

## 2020-10-18 MED ORDER — HYDROCODONE-ACETAMINOPHEN 5-325 MG PO TABS: 1.0000 | ORAL_TABLET | Freq: Four times a day (QID) | ORAL | 0 refills | Status: AC | PRN

## 2020-10-18 MED ORDER — CLONAZEPAM 0.5 MG PO TABS
0.5000 mg | ORAL_TABLET | Freq: Every day | ORAL | 0 refills | Status: AC
Start: 2020-10-18 — End: ?

## 2020-10-18 MED ORDER — SODIUM CHLORIDE 0.9 % IV SOLN: INTRAVENOUS | Status: AC

## 2020-10-18 MED ORDER — CARBIDOPA-LEVODOPA ER 25-100 MG PO TBCR
1.0000 | EXTENDED_RELEASE_TABLET | Freq: Four times a day (QID) | ORAL | Status: DC
  Administered 2020-10-18 – 2020-10-19 (×2): 1 via ORAL
  Filled 2020-10-18 (×6): qty 1

## 2020-10-18 MED ORDER — ENOXAPARIN SODIUM 40 MG/0.4ML IJ SOSY: 40.0000 mg | PREFILLED_SYRINGE | INTRAMUSCULAR | 0 refills | Status: AC

## 2020-10-18 MED ORDER — SODIUM CHLORIDE 0.9 % IV SOLN: INTRAVENOUS | Status: DC

## 2020-10-18 MED ORDER — BISACODYL 5 MG PO TBEC: 5.0000 mg | DELAYED_RELEASE_TABLET | Freq: Every day | ORAL | 0 refills | Status: AC

## 2020-10-18 NOTE — Discharge Summary (Addendum)
Physician Discharge Summary  Henry Adams JQB:[id number] DOB: 20-Jun-1940 DOA: 10/14/2020  PCP: [name], MD  Admit date: 10/14/2020 Discharge date: 10/18/2020  Admitted From: [facility] Disposition:  SNF  Recommendations for Outpatient Follow-up:  Follow up with PCP in 1-2 weeks Please obtain BMP/CBC in one week Palliative care to meet with family at facility   Home Health:no Equipment/Devices:none  Discharge Condition:Stable CODE STATUS:Full Diet recommendation: Heart Healthy  Brief/Interim Summary: 80 y.o. male past medical history significant for Parkinson's disease, peripheral neuropathy with history of B12 deficiency chronic anxiety restless leg syndrome on Klonopin, chronic constipation dementia recurrent falls presents to the ED after an unwitnessed fall complaining of right hip pain work-up in the ED revealed displaced right femoral fracture orthopedic surgery was consulted  Discharge Diagnoses:  Active Problems:   Right femoral fracture (HCC)  Acute displaced right femoral fracture due to mechanical fall: Orthopedic surgery was consulted who performed right hip hemiarthroplasty. Physical therapy evaluated the patient and recommended skilled nursing facility.  My leukocytosis: Likely reactive due to acute fracture as an afebrile leukocytosis improved.  Acute kidney injury on chronic kidney disease stage IIIb: Likely prerenal in the setting of decreased oral intake this resolved with IV fluid hydration.  Central retention: Blood pressure stable continue current regimen no changes made to his home regimen.  Parkinson's disease/dementia: No changes made to his medication he was continued on his current regimen.  Chronic anxiety/depression/restless leg syndrome: Adjustment to his medication he was continued on his home regimen.  BPH: Continue Proscar.  Chronic constipation: Continue MiraLAX p.o. at facility.  Discharge Instructions  Discharge Instructions      Diet - low sodium heart healthy   Complete by: As directed    Increase activity slowly   Complete by: As directed    No wound care   Complete by: As directed       Allergies as of 10/18/2020       Reactions   Iodine    rash   Oxycodone    Mental status changes & nausea   Melatonin    Per daughter, pt does not tolerate - increased confusion   Pollen Extract Itching   Tamiflu [oseltamivir Phosphate] Other (See Comments)   hallucinations   Tamsulosin    Other reaction(s): Other (See Comments) Unknown    Doxazosin Rash   agitation   Etodolac Rash   unknown   Latex Rash   Daughter states patient has been told not to use latex band aids.   Tape Rash   Certain "Band-Aids"        Medication List     STOP taking these medications    cefUROXime 500 MG tablet Commonly known as: CEFTIN       TAKE these medications    acetaminophen 500 MG tablet Commonly known as: TYLENOL 500 mg prn for back, knee, shoulder or head pain. May be given up to every 6 hours. What changed:  how much to take how to take this when to take this   alfuzosin 10 MG 24 hr tablet Commonly known as: UROXATRAL Take 1 tablet (10 mg total) by mouth 2 (two) times daily. 10 mg at 7 am and 7 pm daily.   bisacodyl 5 MG EC tablet Commonly known as: DULCOLAX Take 1 tablet (5 mg total) by mouth daily for 3 days.   Calmoseptine 0.44-20.6 % Oint Generic drug: Menthol-Zinc Oxide Apply 1 Act topically daily.   Carbidopa-Levodopa ER 25-100 MG tablet controlled release Commonly known  as: SINEMET CR Take 1.5 tablets by mouth every evening. What changed: Another medication with the same name was changed. Make sure you understand how and when to take each.   Carbidopa-Levodopa ER 25-100 MG tablet controlled release Commonly known as: SINEMET CR TAKE ONE TABLET AT BEDTIME. What changed:  how much to take how to take this when to take this additional instructions   carboxymethylcellulose 0.5 %  Soln Commonly known as: REFRESH PLUS 1 drop 3 (three) times daily as needed (dry eyes lubricant).   clonazePAM 0.5 MG tablet Commonly known as: KLONOPIN Take 1 tablet (0.5 mg total) by mouth at bedtime.   donepezil 10 MG tablet Commonly known as: ARICEPT Take 1 tablet (10 mg total) by mouth at bedtime.   enoxaparin 40 MG/0.4ML injection Commonly known as: LOVENOX Inject 0.4 mLs (40 mg total) into the skin daily. Start taking on: October 19, 2020   fexofenadine 180 MG tablet Commonly known as: ALLEGRA Take 1 tablet (180 mg total) by mouth daily. 180mg  QD   finasteride 5 MG tablet Commonly known as: PROSCAR Take 1 tablet (5 mg total) by mouth daily. 5mg  QD   FLUoxetine 10 MG capsule Commonly known as: PROZAC Take 1 capsule (10 mg total) by mouth daily.   HYDROcodone-acetaminophen 5-325 MG tablet Commonly known as: NORCO/VICODIN Take 1 tablet by mouth every 6 (six) hours as needed for moderate pain.   meclizine 12.5 MG tablet Commonly known as: ANTIVERT Take 12.5 mg by mouth 3 (three) times daily as needed for dizziness.   memantine 10 MG tablet Commonly known as: NAMENDA Take 10 mg by mouth 2 (two) times daily.   MIRALAX PO Take 17 g by mouth See admin instructions. With 8 ounce of water and drink by mouth every day as needed for mild constipation.   omeprazole 40 MG capsule Commonly known as: PRILOSEC TAKE (1) CAPSULE DAILY.   [facility] Colon Health Caps Take 1 capsule by mouth daily.   vitamin B-12 1000 MCG tablet Commonly known as: CYANOCOBALAMIN Take 1 tablet (1,000 mcg total) by mouth daily.        Follow-up Information     Vanetta Mulders, MD Follow up in 2 week(s).   Specialty: Orthopedic Surgery Contact information: [address] [place] [address] [phone]                Allergies  Allergen Reactions   Iodine     rash   Oxycodone     Mental status changes & nausea   Melatonin     Per daughter, pt does not  tolerate - increased confusion   Pollen Extract Itching   Tamiflu [Oseltamivir Phosphate] Other (See Comments)    hallucinations   Tamsulosin     Other reaction(s): Other (See Comments) Unknown    Doxazosin Rash    agitation   Etodolac Rash    unknown   Latex Rash    Daughter states patient has been told not to use latex band aids.   Tape Rash    Certain "Band-Aids"    Consultations: Orthopedic surgery   Procedures/Studies: DG Chest 1 View  Result Date: 10/15/2020 CLINICAL DATA:  Unwitnessed fall from standing. EXAM: CHEST  1 VIEW COMPARISON:  02/22/2020 FINDINGS: Patient is rotated. Lung volumes are low. Prominent heart size likely accentuated by technique. Bandlike opacity in the right suprahilar region favors atelectasis. Vascular congestion versus bronchovascular crowding. No visualized pneumothorax or confluent airspace disease. No pleural fluid. No acute osseous abnormalities are  seen. IMPRESSION: Rotated exam with low lung volumes and right suprahilar atelectasis. Vascular congestion versus bronchovascular crowding. Electronically Signed   By: Keith Rake M.D.   On: 10/15/2020 00:43   DG Pelvis 1-2 Views  Result Date: 10/15/2020 CLINICAL DATA:  Postop EXAM: PELVIS - 1-2 VIEW COMPARISON:  X-ray intraoperative right hip 10/15/2020 FINDINGS: Status post total right hip arthroplasty. There is no evidence of pelvic fracture or diastasis. No pelvic bone lesions are seen. Right hip subcutaneus soft tissue edema and emphysema consistent with postsurgical changes. IMPRESSION: Status post total right hip arthroplasty. Electronically Signed   By: Iven Finn M.D.   On: 10/15/2020 19:09   DG Pelvis 1-2 Views  Result Date: 10/15/2020 CLINICAL DATA:  Postop right hip arthroplasty. EXAM: PELVIS - 1 VIEW COMPARISON:  10/05/2019 FINDINGS: Single view shows a right hip hemiarthroplasty that appears well seated and aligned. Two hemostats project above the arthroplasty, a third medial.  IMPRESSION: Single portable operative image shows a well-positioned right hip hemiarthroplasty. Electronically Signed   By: Lajean Manes M.D.   On: 10/15/2020 15:27   CT HEAD WO CONTRAST (5MM)  Result Date: 10/15/2020 CLINICAL DATA:  Head trauma, minor (Age >= 65y) Unwitnessed fall. EXAM: CT HEAD WITHOUT CONTRAST TECHNIQUE: Contiguous axial images were obtained from the base of the skull through the vertex without intravenous contrast. COMPARISON:  02/16/2020, brain MRI 02/10/2017 FINDINGS: Brain: Stable degree of atrophy and chronic small vessel ischemia. No intracranial hemorrhage, mass effect, or midline shift. The previous 1 cm meningioma involving the posterior aspect of the foramen magnum is grossly stable from prior MRI, though not well assessed by CT, series 3, image 3. There is no associated mass effect. No hydrocephalus. The basilar cisterns are patent. No evidence of territorial infarct or acute ischemia. No extra-axial or intracranial fluid collection. Vascular: Atherosclerosis of skullbase vasculature without hyperdense vessel or abnormal calcification. Skull: No fracture or focal lesion. Sinuses/Orbits: Mucous retention cyst in the left maxillary sinus. No acute findings. Mastoid air cells are clear. Other: None. IMPRESSION: 1. No acute intracranial abnormality. No skull fracture. 2. Stable atrophy and chronic small vessel ischemia. 3. Unchanged small meningioma at the foramen magnum. Electronically Signed   By: Keith Rake M.D.   On: 10/15/2020 00:39   CT Cervical Spine Wo Contrast  Result Date: 10/15/2020 CLINICAL DATA:  Neck trauma (Age >= 65y) Unwitnessed fall from standing. EXAM: CT CERVICAL SPINE WITHOUT CONTRAST TECHNIQUE: Multidetector CT imaging of the cervical spine was performed without intravenous contrast. Multiplanar CT image reconstructions were also generated. COMPARISON:  None. FINDINGS: Alignment: Trace degenerative type anterolisthesis of C7 on T1. No traumatic  subluxation. Skull base and vertebrae: No acute fracture. Vertebral body heights are maintained. The dens and skull base are intact. Soft tissues and spinal canal: No prevertebral fluid or swelling. No visible canal hematoma. Disc levels: Diffuse degenerative disc disease, advanced at C5-C6 and C6-C7. There is prominent multilevel facet hypertrophy Upper chest: No acute or unexpected findings. Other: None. IMPRESSION: 1. No acute fracture or subluxation of the cervical spine. 2. Multilevel degenerative disc disease and facet hypertrophy. Electronically Signed   By: Keith Rake M.D.   On: 10/15/2020 00:42   DG Hip Unilat W or Wo Pelvis 2-3 Views Right  Result Date: 10/15/2020 CLINICAL DATA:  Unwitnessed fall from standing.  Right hip pain. EXAM: DG HIP (WITH OR WITHOUT PELVIS) 2-3V RIGHT COMPARISON:  None. FINDINGS: Displaced right femoral neck fracture. Mild proximal migration of the femoral shaft. Femoral head remains  seated. The bones are diffusely under mineralized. No pubic rami fracture. No pubic symphyseal or sacroiliac joint diastasis. IMPRESSION: Displaced right femoral neck fracture. Electronically Signed   By: Keith Rake M.D.   On: 10/15/2020 00:44    Subjective: No new complaints  Discharge Exam: Vitals:   10/17/20 2100 10/18/20 0820  BP: 138/69 (!) 141/73  Pulse: 65 75  Resp: 18 16  Temp: 98.4 F (36.9 C) 98.7 F (37.1 C)  SpO2: 93% 94%   Vitals:   10/17/20 0800 10/17/20 1500 10/17/20 2100 10/18/20 0820  BP: 115/74 117/81 138/69 (!) 141/73  Pulse: 71 64 65 75  Resp: 19 18 18 16   Temp: 99.1 F (37.3 C) 98.7 F (37.1 C) 98.4 F (36.9 C) 98.7 F (37.1 C)  TempSrc: Axillary Oral Oral Oral  SpO2: 96% 95% 93% 94%  Weight:      Height:        General: Pt is alert, awake, not in acute distress Cardiovascular: RRR, S1/S2 +, no rubs, no gallops Respiratory: CTA bilaterally, no wheezing, no rhonchi Abdominal: Soft, NT, ND, bowel sounds + Extremities: no edema, no  cyanosis    The results of significant diagnostics from this hospitalization (including imaging, microbiology, ancillary and laboratory) are listed below for reference.     Microbiology: Recent Results (from the past 240 hour(s))  Resp Panel by RT-PCR (Flu A&B, Covid) Nasopharyngeal Swab     Status: None   Collection Time: 10/14/20 11:28 PM   Specimen: Nasopharyngeal Swab; Nasopharyngeal(NP) swabs in vial transport medium  Result Value Ref Range Status   SARS Coronavirus 2 by RT PCR NEGATIVE NEGATIVE Final    Comment: (NOTE) SARS-CoV-2 target nucleic acids are NOT DETECTED.  The SARS-CoV-2 RNA is generally detectable in upper respiratory specimens during the acute phase of infection. The lowest concentration of SARS-CoV-2 viral copies this assay can detect is 138 copies/mL. A negative result does not preclude SARS-Cov-2 infection and should not be used as the sole basis for treatment or other patient management decisions. A negative result may occur with  improper specimen collection/handling, submission of specimen other than nasopharyngeal swab, presence of viral mutation(s) within the areas targeted by this assay, and inadequate number of viral copies(<138 copies/mL). A negative result must be combined with clinical observations, patient history, and epidemiological information. The expected result is Negative.  Fact Sheet for Patients:  EntrepreneurPulse.com.au  Fact Sheet for Healthcare Providers:  IncredibleEmployment.be  This test is no t yet approved or cleared by the [place] FDA and  has been authorized for detection and/or diagnosis of SARS-CoV-2 by FDA under an Emergency Use Authorization (EUA). This EUA will remain  in effect (meaning this test can be used) for the duration of the COVID-19 declaration under Section 564(b)(1) of the Act, 21 U.S.C.section 360bbb-3(b)(1), unless the authorization is terminated  or revoked  sooner.       Influenza A by PCR NEGATIVE NEGATIVE Final   Influenza B by PCR NEGATIVE NEGATIVE Final    Comment: (NOTE) The Xpert Xpress SARS-CoV-2/FLU/RSV plus assay is intended as an aid in the diagnosis of influenza from Nasopharyngeal swab specimens and should not be used as a sole basis for treatment. Nasal washings and aspirates are unacceptable for Xpert Xpress SARS-CoV-2/FLU/RSV testing.  Fact Sheet for Patients: EntrepreneurPulse.com.au  Fact Sheet for Healthcare Providers: IncredibleEmployment.be  This test is not yet approved or cleared by the [place] FDA and has been authorized for detection and/or diagnosis of SARS-CoV-2 by FDA under an  Emergency Use Authorization (EUA). This EUA will remain in effect (meaning this test can be used) for the duration of the COVID-19 declaration under Section 564(b)(1) of the Act, 21 U.S.C. section 360bbb-3(b)(1), unless the authorization is terminated or revoked.  Performed at [facility], [place] [address], [place], [place] [address]   Surgical pcr screen     Status: None   Collection Time: 10/15/20  9:19 AM   Specimen: Nasal Mucosa; Nasal Swab  Result Value Ref Range Status   MRSA, PCR NEGATIVE NEGATIVE Final   Staphylococcus aureus NEGATIVE NEGATIVE Final    Comment: (NOTE) The Xpert SA Assay (FDA approved for NASAL specimens in patients 24 years of age and older), is one component of a comprehensive surveillance program. It is not intended to diagnose infection nor to guide or monitor treatment. Performed at [facility], [place] [address] [place], [place] [address]   Urine Culture     Status: Abnormal   Collection Time: 10/15/20 12:02 PM   Specimen: Urine, Clean Catch  Result Value Ref Range Status   Specimen Description URINE, CLEAN CATCH  Final   Special Requests NONE  Final   Culture (A)  Final    <10,000 COLONIES/mL INSIGNIFICANT GROWTH Performed at [facility], [place] [address] [place], [place] [address]    Report Status 10/16/2020 FINAL  Final     Labs: BNP (last 3 results) No results for input(s): BNP in the last 8760 hours. Basic Metabolic Panel: Recent Labs  Lab 10/14/20 2327 10/15/20 0349 10/16/20 0507 10/17/20 1354 10/18/20 0046  NA 136 136 134* 136 136  K 4.1 3.9 4.0 3.5 3.8  CL 106 105 104 105 108  CO2 23 23 22 22  20*  GLUCOSE 116* 115* 121* 120* 103*  BUN 39* 33* 36* 51* 49*  CREATININE 1.94* 1.69* 1.90* 1.99* 1.77*  CALCIUM 8.4* 8.6* 8.0* 8.1* 8.1*  MG  --  2.1  --   --   --   PHOS  --  3.0  --   --   --    Liver Function Tests: Recent Labs  Lab 10/14/20 2327  AST 23  ALT 7  ALKPHOS 85  BILITOT 0.8  PROT 6.9  ALBUMIN 3.3*   No results for input(s): LIPASE, AMYLASE in the last 168 hours. No results for input(s): AMMONIA in the last 168 hours. CBC: Recent Labs  Lab 10/14/20 2327 10/15/20 0349 10/16/20 0507  WBC 6.0 11.0* 12.5*  NEUTROABS 3.7  --   --   HGB 11.6* 12.3* 11.6*  HCT 36.4* 37.2* 34.4*  MCV 96.8 95.6 93.7  PLT 141* 153 131*   Cardiac Enzymes: No results for input(s): CKTOTAL, CKMB, CKMBINDEX, TROPONINI in the last 168 hours. BNP: Invalid input(s): POCBNP CBG: No results for input(s): GLUCAP in the last 168 hours. D-Dimer No results for input(s): DDIMER in the last 72 hours. Hgb A1c No results for input(s): HGBA1C in the last 72 hours. Lipid Profile No results for input(s): CHOL, HDL, LDLCALC, TRIG, CHOLHDL, LDLDIRECT in the last 72 hours. Thyroid function studies No results for input(s): TSH, T4TOTAL, T3FREE, THYROIDAB in the last 72 hours.  Invalid input(s): FREET3 Anemia work up No results for input(s): VITAMINB12, FOLATE, FERRITIN, TIBC, IRON, RETICCTPCT in the last 72 hours. Urinalysis    Component Value Date/Time   COLORURINE YELLOW 10/15/2020 1233   APPEARANCEUR CLEAR 10/15/2020 1233   LABSPEC 1.017 10/15/2020 1233   PHURINE 5.0 10/15/2020 1233   GLUCOSEU  NEGATIVE  10/15/2020 1233   GLUCOSEU NEGATIVE 10/30/2017 1144   HGBUR SMALL (A) 10/15/2020 1233   BILIRUBINUR NEGATIVE 10/15/2020 1233   BILIRUBINUR Negative 08/18/2019 1139   KETONESUR NEGATIVE 10/15/2020 1233   PROTEINUR 100 (A) 10/15/2020 1233   UROBILINOGEN 0.2 08/18/2019 1139   UROBILINOGEN 1.0 06/16/2018 0951   NITRITE NEGATIVE 10/15/2020 1233   LEUKOCYTESUR NEGATIVE 10/15/2020 1233   Sepsis Labs Invalid input(s): PROCALCITONIN,  WBC,  LACTICIDVEN Microbiology Recent Results (from the past 240 hour(s))  Resp Panel by RT-PCR (Flu A&B, Covid) Nasopharyngeal Swab     Status: None   Collection Time: 10/14/20 11:28 PM   Specimen: Nasopharyngeal Swab; Nasopharyngeal(NP) swabs in vial transport medium  Result Value Ref Range Status   SARS Coronavirus 2 by RT PCR NEGATIVE NEGATIVE Final    Comment: (NOTE) SARS-CoV-2 target nucleic acids are NOT DETECTED.  The SARS-CoV-2 RNA is generally detectable in upper respiratory specimens during the acute phase of infection. The lowest concentration of SARS-CoV-2 viral copies this assay can detect is 138 copies/mL. A negative result does not preclude SARS-Cov-2 infection and should not be used as the sole basis for treatment or other patient management decisions. A negative result may occur with  improper specimen collection/handling, submission of specimen other than nasopharyngeal swab, presence of viral mutation(s) within the areas targeted by this assay, and inadequate number of viral copies(<138 copies/mL). A negative result must be combined with clinical observations, patient history, and epidemiological information. The expected result is Negative.  Fact Sheet for Patients:  EntrepreneurPulse.com.au  Fact Sheet for Healthcare Providers:  IncredibleEmployment.be  This test is no t yet approved or cleared by the [place] FDA and  has been authorized for detection and/or diagnosis of SARS-CoV-2 by FDA  under an Emergency Use Authorization (EUA). This EUA will remain  in effect (meaning this test can be used) for the duration of the COVID-19 declaration under Section 564(b)(1) of the Act, 21 U.S.C.section 360bbb-3(b)(1), unless the authorization is terminated  or revoked sooner.       Influenza A by PCR NEGATIVE NEGATIVE Final   Influenza B by PCR NEGATIVE NEGATIVE Final    Comment: (NOTE) The Xpert Xpress SARS-CoV-2/FLU/RSV plus assay is intended as an aid in the diagnosis of influenza from Nasopharyngeal swab specimens and should not be used as a sole basis for treatment. Nasal washings and aspirates are unacceptable for Xpert Xpress SARS-CoV-2/FLU/RSV testing.  Fact Sheet for Patients: EntrepreneurPulse.com.au  Fact Sheet for Healthcare Providers: IncredibleEmployment.be  This test is not yet approved or cleared by the [place] FDA and has been authorized for detection and/or diagnosis of SARS-CoV-2 by FDA under an Emergency Use Authorization (EUA). This EUA will remain in effect (meaning this test can be used) for the duration of the COVID-19 declaration under Section 564(b)(1) of the Act, 21 U.S.C. section 360bbb-3(b)(1), unless the authorization is terminated or revoked.  Performed at [facility], [place] [address] [place], [place] [address]   Surgical pcr screen     Status: None   Collection Time: 10/15/20  9:19 AM   Specimen: Nasal Mucosa; Nasal Swab  Result Value Ref Range Status   MRSA, PCR NEGATIVE NEGATIVE Final   Staphylococcus aureus NEGATIVE NEGATIVE Final    Comment: (NOTE) The Xpert SA Assay (FDA approved for NASAL specimens in patients 18 years of age and older), is one component of a comprehensive surveillance program. It is not intended to diagnose infection nor to guide or monitor treatment. Performed at  [facility], [place] [address] [place], [place] [address]   Urine Culture     Status: Abnormal    Collection Time: 10/15/20 12:02 PM   Specimen: Urine, Clean Catch  Result Value Ref Range Status   Specimen Description URINE, CLEAN CATCH  Final   Special Requests NONE  Final   Culture (A)  Final    <10,000 COLONIES/mL INSIGNIFICANT GROWTH Performed at [facility], [place] [address], [place], [place] [address]    Report Status 10/16/2020 FINAL  Final      SIGNED:   Charlynne Cousins, MD  [facility] 10/18/2020, 10:22 AM Pager   If 7PM-7AM, please contact night-coverage [email or web address] Password [email or web address]

## 2020-10-18 NOTE — TOC Progression Note (Addendum)
Transition of Care [facility]) - Initial/Assessment Note    Patient Details  Name: Henry Adams MRN: [id number] Date of Birth: 06/27/1940  Transition of Care [facility]) CM/SW Contact:    Milinda Antis, [place] Phone Number: 10/18/2020, 10:42 AM  Clinical Narrative:                 10:42-  CSW contacted the family's facility of choice, [facility].  The facility does not have any availability.    10:50-  CSW contacted the patient's daughter and presented bed offers.  The daughter will inform CSW of the family's decision by the end of the day.        Patient Goals and CMS Choice        Expected Discharge Plan and Services           Expected Discharge Date: 10/18/20                                    Prior Living Arrangements/Services                       Activities of Daily Living Home Assistive Devices/Equipment: Henry Adams (specify quad or straight) ADL Screening (condition at time of admission) Patient's cognitive ability adequate to safely complete daily activities?: Yes Is the patient deaf or have difficulty hearing?: Yes Does the patient have difficulty seeing, even when wearing glasses/contacts?: No Does the patient have difficulty concentrating, remembering, or making decisions?: No Patient able to express need for assistance with ADLs?: Yes Does the patient have difficulty dressing or bathing?: No Independently performs ADLs?: No Communication: Independent Dressing (OT): Needs assistance Is this a change from baseline?: Pre-admission baseline Grooming: Independent Feeding: Independent Bathing: Needs assistance Is this a change from baseline?: Pre-admission baseline Toileting: Needs assistance Is this a change from baseline?: Pre-admission baseline In/Out Bed: Independent Walks in Home: Independent Does the patient have difficulty walking or climbing stairs?: Yes Weakness of Legs: Both Weakness of Arms/Hands: None  Permission Sought/Granted                   Emotional Assessment              Admission diagnosis:  Fall [W19.XXXA] Right femoral fracture ([place]) [S72.91XA] Fall, initial encounter [W19.XXXA] Closed fracture of right hip, initial encounter ([place]) [S72.001A] Patient Active Problem List   Diagnosis Date Noted   Right femoral fracture ([place]) 10/15/2020   Tinea corporis 02/11/2020   Allergic contact dermatitis due to drugs in contact with skin 02/11/2020   Acute bronchitis due to COVID-19 virus 02/05/2020   Traumatic coccydynia 10/05/2019   Pressure injury of buttock, stage 1 10/05/2019   Irritable bowel syndrome with both constipation and diarrhea 08/18/2019   Dysuria 08/18/2019   Epigastric abdominal tenderness without rebound tenderness 05/21/2019   Psychophysiological insomnia 04/10/2019   Benign prostatic hyperplasia with urinary hesitancy 06/25/2018   Parkinsonism ([place])    UTI (urinary tract infection) 06/16/2018   CRI (chronic renal insufficiency), stage 3 (moderate) (HCC) 03/27/2018   Restless leg syndrome 01/23/2018   Calculus of gallbladder with chronic cholecystitis without obstruction 09/18/2017   Chronic idiopathic constipation 08/19/2017   BPH associated with nocturia 08/11/2015   Vocal cord atrophy 09/01/2013   Essential hypertension 04/26/2007   Allergic rhinitis 04/26/2007   DEGENERATIVE JOINT DISEASE 04/26/2007   G E R D 08/05/2006   Hyperlipidemia with target LDL less than  160 05/30/2006   Polycystic kidney 05/30/2006   PCP:  [name], MD Pharmacy:  No Pharmacies Listed    Social Determinants of Health (SDOH) Interventions    Readmission Risk Interventions Readmission Risk Prevention Plan 06/17/2018  Transportation Screening Complete  Home Care Screening Complete  Some recent data might be hidden

## 2020-10-18 NOTE — Care Management Important Message (Signed)
Important Message  Patient Details  Name: Henry Adams MRN: [id number] Date of Birth: 06/14/1940   Medicare Important Message Given:  Yes     Orbie Pyo 10/18/2020, 1:57 PM

## 2020-10-18 NOTE — Progress Notes (Signed)
   Subjective:  Patient reports pain is well controlled. Mild confusion on visit this am, nonfocal.  Objective:   VITALS:   Vitals:   10/16/20 2100 10/17/20 0800 10/17/20 1500 10/17/20 2100  BP: 135/82 115/74 117/81 138/69  Pulse: 75 71 64 65  Resp: 18 19 18 18   Temp: 99.5 F (37.5 C) 99.1 F (37.3 C) 98.7 F (37.1 C) 98.4 F (36.9 C)  TempSrc: Oral Axillary Oral Oral  SpO2: 95% 96% 95% 93%  Weight:      Height:       Right hip dressing is clean and dry.  Limb lengths are equal.  He is able to extend and flex at the ankle without difficulty.  All of his toes are warm and well-perfused.   Lab Results  Component Value Date   WBC 12.5 (H) 10/16/2020   HGB 11.6 (L) 10/16/2020   HCT 34.4 (L) 10/16/2020   MCV 93.7 10/16/2020   PLT 131 (L) 10/16/2020     Assessment/Plan:  3 Days Post-Op status post right hip hemiarthroplasty  - Expected postop acute blood loss anemia - will monitor for symptoms - Patient to work with PT/OT to optimize mobilization safely - DVT ppx - SCDs, ambulation, Lovenox - Postoperative Abx: Ancef x 2 additional doses given - WBAT operative extremity - Pain control - multimodal pain management, ATC acetaminophen in conjunction with as needed narcotic (oxycodone), although this should be minimized with other modalities     Ewan Grau 10/18/2020, 7:50 AM

## 2020-10-18 NOTE — Progress Notes (Signed)
PT Cancellation Note  Patient Details Name: Henry Adams MRN: [id number] DOB: 03-01-1940   Cancelled Treatment:    Reason Eval/Treat Not Completed: Other (comment).  Refusing therapy and will retry at another time.   Ramond Dial 10/18/2020, 4:28 PM  Mee Hives, PT MS Acute Rehab Dept. Number: [place]

## 2020-10-19 DIAGNOSIS — R52 Pain, unspecified: Secondary | ICD-10-CM | POA: Diagnosis not present

## 2020-10-19 DIAGNOSIS — R296 Repeated falls: Secondary | ICD-10-CM | POA: Diagnosis not present

## 2020-10-19 DIAGNOSIS — G2581 Restless legs syndrome: Secondary | ICD-10-CM | POA: Diagnosis not present

## 2020-10-19 DIAGNOSIS — R404 Transient alteration of awareness: Secondary | ICD-10-CM | POA: Diagnosis not present

## 2020-10-19 DIAGNOSIS — D519 Vitamin B12 deficiency anemia, unspecified: Secondary | ICD-10-CM | POA: Diagnosis not present

## 2020-10-19 DIAGNOSIS — S72001D Fracture of unspecified part of neck of right femur, subsequent encounter for closed fracture with routine healing: Secondary | ICD-10-CM | POA: Diagnosis not present

## 2020-10-19 DIAGNOSIS — K5904 Chronic idiopathic constipation: Secondary | ICD-10-CM | POA: Diagnosis not present

## 2020-10-19 DIAGNOSIS — Y92099 Unspecified place in other non-institutional residence as the place of occurrence of the external cause: Secondary | ICD-10-CM | POA: Diagnosis not present

## 2020-10-19 DIAGNOSIS — K219 Gastro-esophageal reflux disease without esophagitis: Secondary | ICD-10-CM | POA: Diagnosis not present

## 2020-10-19 DIAGNOSIS — Z23 Encounter for immunization: Secondary | ICD-10-CM | POA: Diagnosis not present

## 2020-10-19 DIAGNOSIS — N390 Urinary tract infection, site not specified: Secondary | ICD-10-CM | POA: Diagnosis not present

## 2020-10-19 DIAGNOSIS — L98419 Non-pressure chronic ulcer of buttock with unspecified severity: Secondary | ICD-10-CM | POA: Diagnosis not present

## 2020-10-19 DIAGNOSIS — I129 Hypertensive chronic kidney disease with stage 1 through stage 4 chronic kidney disease, or unspecified chronic kidney disease: Secondary | ICD-10-CM | POA: Diagnosis not present

## 2020-10-19 DIAGNOSIS — R600 Localized edema: Secondary | ICD-10-CM | POA: Diagnosis not present

## 2020-10-19 DIAGNOSIS — F32A Depression, unspecified: Secondary | ICD-10-CM | POA: Diagnosis not present

## 2020-10-19 DIAGNOSIS — S7291XA Unspecified fracture of right femur, initial encounter for closed fracture: Secondary | ICD-10-CM | POA: Diagnosis not present

## 2020-10-19 DIAGNOSIS — F03918 Unspecified dementia, unspecified severity, with other behavioral disturbance: Secondary | ICD-10-CM | POA: Diagnosis not present

## 2020-10-19 DIAGNOSIS — I10 Essential (primary) hypertension: Secondary | ICD-10-CM | POA: Diagnosis not present

## 2020-10-19 DIAGNOSIS — M1990 Unspecified osteoarthritis, unspecified site: Secondary | ICD-10-CM | POA: Diagnosis not present

## 2020-10-19 DIAGNOSIS — W1830XA Fall on same level, unspecified, initial encounter: Secondary | ICD-10-CM | POA: Diagnosis not present

## 2020-10-19 DIAGNOSIS — N183 Chronic kidney disease, stage 3 unspecified: Secondary | ICD-10-CM | POA: Diagnosis not present

## 2020-10-19 DIAGNOSIS — M2580 Other specified joint disorders, unspecified joint: Secondary | ICD-10-CM | POA: Diagnosis not present

## 2020-10-19 DIAGNOSIS — G20 Parkinson's disease: Secondary | ICD-10-CM | POA: Diagnosis not present

## 2020-10-19 DIAGNOSIS — D649 Anemia, unspecified: Secondary | ICD-10-CM | POA: Diagnosis not present

## 2020-10-19 DIAGNOSIS — D72829 Elevated white blood cell count, unspecified: Secondary | ICD-10-CM | POA: Diagnosis not present

## 2020-10-19 DIAGNOSIS — N1832 Chronic kidney disease, stage 3b: Secondary | ICD-10-CM | POA: Diagnosis not present

## 2020-10-19 DIAGNOSIS — S7291XP Unspecified fracture of right femur, subsequent encounter for closed fracture with malunion: Secondary | ICD-10-CM | POA: Diagnosis not present

## 2020-10-19 DIAGNOSIS — Z471 Aftercare following joint replacement surgery: Secondary | ICD-10-CM | POA: Diagnosis not present

## 2020-10-19 DIAGNOSIS — Z7401 Bed confinement status: Secondary | ICD-10-CM | POA: Diagnosis not present

## 2020-10-19 NOTE — Progress Notes (Signed)
Physical Therapy Treatment Patient Details Name: Henry Adams MRN: [id number] DOB: [birth year] Today's Date: 10/19/2020   History of Present Illness Pt is an 80 y.o. male admitted 9/23 following an unwitnessed fall at his SNF. Xray revealed R femoral fx. He underwent R hip hemiarthroplasty (anterior approach) 9/24. PMH significant for Parkinson's disease, peripheral neuropathy with history of B12 deficiency, chronic anxiety/depression, restless leg syndrome on Klonopin, chronic constipation, dementia, and recurrent falls.    PT Comments    Pt tolerates treatment well with improved initiation of mobility. Pt continues to require significant physical assistance to perform all functional mobility. Pt remains at a high risk of falls due to weakness and posterior lean. Pt will continue to benefit from aggressive mobilization and PT services to reduce falls risk and caregiver burden. PT continues to recommend SNF placement.  Recommendations for follow up therapy are one component of a multi-disciplinary discharge planning process, led by the attending physician.  Recommendations may be updated based on patient status, additional functional criteria and insurance authorization.  Follow Up Recommendations  SNF     Equipment Recommendations   (defer to SNF)    Recommendations for Other Services       Precautions / Restrictions Precautions Precautions: Fall Restrictions Weight Bearing Restrictions: Yes RLE Weight Bearing: Weight bearing as tolerated     Mobility  Bed Mobility Overal bed mobility: Needs Assistance Bed Mobility: Supine to Sit;Sit to Supine     Supine to sit: HOB elevated;Max assist Sit to supine: Mod assist;HOB elevated   General bed mobility comments: PT assists with movement of RLE, pt able to advance LLE. PT with assist to pivot hips and lift trunk into upright position    Transfers Overall transfer level: Needs assistance Equipment used: 1 person hand held  assist;Rolling walker (2 wheeled) Transfers: Sit to/from [facility] Sit to Stand: Max assist Stand pivot transfers: Max assist       General transfer comment: PT provides knee block and BUE support, encouragement for anterior trunk lean. Pt performs 5 sit to stand and 2 stand pivot transfers. 1 sit to stand with RW. Pt requiring less assistance and cueing to extend hips with further attempts at standing  Ambulation/Gait                 Stairs             Wheelchair Mobility    Modified Rankin (Stroke Patients Only)       Balance Overall balance assessment: Needs assistance Sitting-balance support: Single extremity supported;Bilateral upper extremity supported;Feet supported Sitting balance-Leahy Scale: Poor Sitting balance - Comments: right lateral lean Postural control: Right lateral lean Standing balance support: Bilateral upper extremity supported Standing balance-Leahy Scale: Poor Standing balance comment: mod-maxA, posterior lean                            Cognition Arousal/Alertness: Awake/alert Behavior During Therapy: Flat affect Overall Cognitive Status: History of cognitive impairments - at baseline                                 General Comments: Dementia at baseline. A&O x 0. Follows one-step commands with increased time      Exercises      General Comments General comments (skin integrity, edema, etc.): VSS on RA      Pertinent Vitals/Pain Pain Assessment: Faces Faces Pain Scale: Hurts  even more Pain Location: RLE with movement Pain Descriptors / Indicators: Grimacing;Guarding Pain Intervention(s): Monitored during session    Home Living                      Prior Function            PT Goals (current goals can now be found in the care plan section) Acute Rehab PT Goals Patient Stated Goal: go to bathroom Progress towards PT goals: Progressing toward goals     Frequency    Min 3X/week      PT Plan Current plan remains appropriate    Co-evaluation              AM-PAC PT "6 Clicks" Mobility   Outcome Measure  Help needed turning from your back to your side while in a flat bed without using bedrails?: A Lot Help needed moving from lying on your back to sitting on the side of a flat bed without using bedrails?: A Lot Help needed moving to and from a bed to a chair (including a wheelchair)?: A Lot Help needed standing up from a chair using your arms (e.g., wheelchair or bedside chair)?: A Lot Help needed to walk in hospital room?: Total Help needed climbing 3-5 steps with a railing? : Total 6 Click Score: 10    End of Session   Activity Tolerance: Patient tolerated treatment well Patient left: in bed;with call bell/phone within reach;with bed alarm set;with family/visitor present Nurse Communication: Mobility status PT Visit Diagnosis: Other abnormalities of gait and mobility (R26.89);Pain;History of falling (Z91.81) Pain - Right/Left: Right Pain - part of body: Leg     Time: [id number] PT Time Calculation (min) (ACUTE ONLY): 30 min  Charges:  $Therapeutic Activity: 23-37 mins                     Zenaida Niece, PT, DPT Acute Rehabilitation Pager: [phone]    Zenaida Niece 10/19/2020, 12:27 PM

## 2020-10-19 NOTE — TOC Transition Note (Addendum)
Transition of Care [facility]) - CM/SW Discharge Note   Patient Details  Name: Henry Adams MRN: [id number] Date of Birth: 1940-04-21  Transition of Care [facility]) CM/SW Contact:  Milinda Antis, [place] Phone Number: 10/19/2020, 10:38 AM   Clinical Narrative:     Patient will DC to:  [facility] Anticipated DC date: 10/19/2020 Family notified:  Yes Transport by: Corey Harold   Per MD patient ready for DC to SNF. RN to call report prior to discharge [phone] and ask for the [place].  Patient will be going to room 413. RN, patient, patient's family, and facility notified of DC. Discharge Summary and FL2 sent to facility. DC packet on chart. Ambulance transport will be requested for patient when RN reports that the patient is ready.   Insurance Josem Kaufmann has been received.  Reference number is [id number].  Approved: 9/28 - 9/30  CSW will sign off for now as social work intervention is no longer needed. Please consult [place] again if new needs arise.    Final next level of care: Skilled Nursing Facility Barriers to Discharge: No Barriers Identified   Patient Goals and CMS Choice        Discharge Placement              Patient chooses bed at:  [facility]) Patient to be transferred to facility by: [facility] Name of family member notified: Hetty Ely (Daughter)   [phone] Patient and family notified of of transfer: 10/19/20  Discharge Plan and Services                                     Social Determinants of Health ([place]) Interventions     Readmission Risk Interventions Readmission Risk Prevention Plan 06/17/2018  Transportation Screening Complete  Home Care Screening Complete  Some recent data might be hidden

## 2020-10-19 NOTE — Progress Notes (Signed)
Report called to facility and given to [facility].

## 2020-10-19 NOTE — Plan of Care (Signed)
  Problem: Activity: Goal: Ability to ambulate and perform ADLs will improve Outcome: Not Progressing   Problem: Clinical Measurements: Goal: Postoperative complications will be avoided or minimized Outcome: Progressing   Problem: Self-Concept: Goal: Ability to maintain and perform role responsibilities to the fullest extent possible will improve Outcome: Not Progressing   Problem: Pain Management: Goal: Pain level will decrease Outcome: Progressing   Problem: Education: Goal: Knowledge of General Education information will improve Description: Including pain rating scale, medication(s)/side effects and non-pharmacologic comfort measures Outcome: Not Progressing   Problem: Health Behavior/Discharge Planning: Goal: Ability to manage health-related needs will improve Outcome: Not Progressing   Problem: Clinical Measurements: Goal: Ability to maintain clinical measurements within normal limits will improve Outcome: Progressing Goal: Will remain free from infection Outcome: Progressing Goal: Diagnostic test results will improve Outcome: Progressing Goal: Respiratory complications will improve Outcome: Progressing Goal: Cardiovascular complication will be avoided Outcome: Progressing   Problem: Activity: Goal: Risk for activity intolerance will decrease Outcome: Not Progressing   Problem: Nutrition: Goal: Adequate nutrition will be maintained Outcome: Progressing   Problem: Coping: Goal: Level of anxiety will decrease Outcome: Progressing   Problem: Elimination: Goal: Will not experience complications related to bowel motility Outcome: Progressing Goal: Will not experience complications related to urinary retention Outcome: Progressing   Problem: Pain Managment: Goal: General experience of comfort will improve Outcome: Progressing   Problem: Safety: Goal: Ability to remain free from injury will improve Outcome: Progressing   Problem: Skin Integrity: Goal:  Risk for impaired skin integrity will decrease Outcome: Progressing   Progressing towards goals as outlined above. Report received and care assumed from previous shift RN. VS obtained, shift assessments completed - see flowsheets. Denies needs for pain intervention. Turned and repositioned q2hr. External catheter in place. Patient currently resting in bed, bed in lowest position. Denies needs. Call bell within reach. Bedalarm in use at all times.

## 2020-10-19 NOTE — Progress Notes (Signed)
PTAR at bedside to take patient to facility. All paperwork in d/c packet. PIVx1 removed. Family at bedside and all personal belongings with family.

## 2020-10-19 NOTE — Progress Notes (Signed)
Patient seen and examined at bedside.  Patient's daughter is also at bedside.  Remains stable, no complaints at this time.  Vital signs are overall stable as well.  Discharge summary completed on 10/18/2020.  No further updates.  Continue care as planned.  All the questions answered.  Discussed with TOC.  Gerlean Ren MD [facility]

## 2020-10-19 NOTE — Plan of Care (Signed)
Problem: Education: Goal: Verbalization of understanding the information provided (i.e., activity precautions, restrictions, etc) will improve 10/19/2020 1244 by Damaris Schooner, RN Outcome: Adequate for Discharge 10/19/2020 1244 by Damaris Schooner, RN Outcome: Adequate for Discharge Goal: Individualized Educational Video(s) 10/19/2020 1244 by Damaris Schooner, RN Outcome: Adequate for Discharge 10/19/2020 1244 by Damaris Schooner, RN Outcome: Adequate for Discharge   Problem: Activity: Goal: Ability to ambulate and perform ADLs will improve 10/19/2020 1244 by Damaris Schooner, RN Outcome: Adequate for Discharge 10/19/2020 1244 by Damaris Schooner, RN Outcome: Adequate for Discharge   Problem: Clinical Measurements: Goal: Postoperative complications will be avoided or minimized 10/19/2020 1244 by Damaris Schooner, RN Outcome: Adequate for Discharge 10/19/2020 1244 by Damaris Schooner, RN Outcome: Adequate for Discharge   Problem: Self-Concept: Goal: Ability to maintain and perform role responsibilities to the fullest extent possible will improve 10/19/2020 1244 by Damaris Schooner, RN Outcome: Adequate for Discharge 10/19/2020 1244 by Damaris Schooner, RN Outcome: Adequate for Discharge   Problem: Pain Management: Goal: Pain level will decrease 10/19/2020 1244 by Damaris Schooner, RN Outcome: Adequate for Discharge 10/19/2020 1244 by Damaris Schooner, RN Outcome: Adequate for Discharge   Problem: Education: Goal: Knowledge of General Education information will improve Description: Including pain rating scale, medication(s)/side effects and non-pharmacologic comfort measures 10/19/2020 1244 by Damaris Schooner, RN Outcome: Adequate for Discharge 10/19/2020 1244 by Damaris Schooner, RN Outcome: Adequate for Discharge   Problem: Health Behavior/Discharge Planning: Goal: Ability to manage health-related needs will improve 10/19/2020 1244 by Damaris Schooner, RN Outcome: Adequate for  Discharge 10/19/2020 1244 by Damaris Schooner, RN Outcome: Adequate for Discharge   Problem: Clinical Measurements: Goal: Ability to maintain clinical measurements within normal limits will improve 10/19/2020 1244 by Damaris Schooner, RN Outcome: Adequate for Discharge 10/19/2020 1244 by Damaris Schooner, RN Outcome: Adequate for Discharge Goal: Will remain free from infection 10/19/2020 1244 by Damaris Schooner, RN Outcome: Adequate for Discharge 10/19/2020 1244 by Damaris Schooner, RN Outcome: Adequate for Discharge Goal: Diagnostic test results will improve 10/19/2020 1244 by Damaris Schooner, RN Outcome: Adequate for Discharge 10/19/2020 1244 by Damaris Schooner, RN Outcome: Adequate for Discharge Goal: Respiratory complications will improve 10/19/2020 1244 by Damaris Schooner, RN Outcome: Adequate for Discharge 10/19/2020 1244 by Damaris Schooner, RN Outcome: Adequate for Discharge Goal: Cardiovascular complication will be avoided 10/19/2020 1244 by Damaris Schooner, RN Outcome: Adequate for Discharge 10/19/2020 1244 by Damaris Schooner, RN Outcome: Adequate for Discharge   Problem: Activity: Goal: Risk for activity intolerance will decrease 10/19/2020 1244 by Damaris Schooner, RN Outcome: Adequate for Discharge 10/19/2020 1244 by Damaris Schooner, RN Outcome: Adequate for Discharge   Problem: Nutrition: Goal: Adequate nutrition will be maintained 10/19/2020 1244 by Damaris Schooner, RN Outcome: Adequate for Discharge 10/19/2020 1244 by Damaris Schooner, RN Outcome: Adequate for Discharge   Problem: Coping: Goal: Level of anxiety will decrease 10/19/2020 1244 by Damaris Schooner, RN Outcome: Adequate for Discharge 10/19/2020 1244 by Damaris Schooner, RN Outcome: Adequate for Discharge   Problem: Elimination: Goal: Will not experience complications related to bowel motility 10/19/2020 1244 by Damaris Schooner, RN Outcome: Adequate for Discharge 10/19/2020 1244 by Damaris Schooner, RN Outcome:  Adequate for Discharge Goal: Will not experience complications related to urinary retention 10/19/2020 1244 by Damaris Schooner, RN Outcome: Adequate for Discharge 10/19/2020 1244 by Damaris Schooner, RN Outcome: Adequate  for Discharge   Problem: Pain Managment: Goal: General experience of comfort will improve 10/19/2020 1244 by Damaris Schooner, RN Outcome: Adequate for Discharge 10/19/2020 1244 by Damaris Schooner, RN Outcome: Adequate for Discharge   Problem: Safety: Goal: Ability to remain free from injury will improve 10/19/2020 1244 by Damaris Schooner, RN Outcome: Adequate for Discharge 10/19/2020 1244 by Damaris Schooner, RN Outcome: Adequate for Discharge   Problem: Skin Integrity: Goal: Risk for impaired skin integrity will decrease 10/19/2020 1244 by Damaris Schooner, RN Outcome: Adequate for Discharge 10/19/2020 1244 by Damaris Schooner, RN Outcome: Adequate for Discharge

## 2020-10-19 NOTE — Plan of Care (Signed)

## 2020-10-21 DIAGNOSIS — K5904 Chronic idiopathic constipation: Secondary | ICD-10-CM | POA: Diagnosis not present

## 2020-10-21 DIAGNOSIS — F32A Depression, unspecified: Secondary | ICD-10-CM | POA: Diagnosis not present

## 2020-10-21 DIAGNOSIS — K219 Gastro-esophageal reflux disease without esophagitis: Secondary | ICD-10-CM | POA: Diagnosis not present

## 2020-10-21 DIAGNOSIS — G20 Parkinson's disease: Secondary | ICD-10-CM | POA: Diagnosis not present

## 2020-10-21 DIAGNOSIS — I10 Essential (primary) hypertension: Secondary | ICD-10-CM | POA: Diagnosis not present

## 2020-10-21 DIAGNOSIS — S7291XA Unspecified fracture of right femur, initial encounter for closed fracture: Secondary | ICD-10-CM | POA: Diagnosis not present

## 2020-10-21 DIAGNOSIS — N1832 Chronic kidney disease, stage 3b: Secondary | ICD-10-CM | POA: Diagnosis not present

## 2020-10-21 DIAGNOSIS — G2581 Restless legs syndrome: Secondary | ICD-10-CM | POA: Diagnosis not present

## 2020-10-21 DIAGNOSIS — M1990 Unspecified osteoarthritis, unspecified site: Secondary | ICD-10-CM | POA: Diagnosis not present

## 2020-10-24 ENCOUNTER — Encounter (HOSPITAL_COMMUNITY): Payer: Self-pay | Admitting: Orthopaedic Surgery

## 2020-10-25 DIAGNOSIS — Z471 Aftercare following joint replacement surgery: Secondary | ICD-10-CM | POA: Diagnosis not present

## 2020-10-27 DIAGNOSIS — D72829 Elevated white blood cell count, unspecified: Secondary | ICD-10-CM | POA: Diagnosis not present

## 2020-11-01 ENCOUNTER — Ambulatory Visit (INDEPENDENT_AMBULATORY_CARE_PROVIDER_SITE_OTHER): Payer: Medicare Other | Admitting: Orthopaedic Surgery

## 2020-11-01 ENCOUNTER — Ambulatory Visit (HOSPITAL_BASED_OUTPATIENT_CLINIC_OR_DEPARTMENT_OTHER)
Admission: RE | Admit: 2020-11-01 | Discharge: 2020-11-01 | Disposition: A | Discharge status: Home / Self Care | Payer: Medicare Other | Source: Ambulatory Visit | Attending: Orthopaedic Surgery | Admitting: Orthopaedic Surgery

## 2020-11-01 ENCOUNTER — Other Ambulatory Visit (HOSPITAL_BASED_OUTPATIENT_CLINIC_OR_DEPARTMENT_OTHER): Payer: Self-pay | Admitting: Orthopaedic Surgery

## 2020-11-01 ENCOUNTER — Other Ambulatory Visit: Payer: Self-pay

## 2020-11-01 DIAGNOSIS — D519 Vitamin B12 deficiency anemia, unspecified: Secondary | ICD-10-CM | POA: Diagnosis not present

## 2020-11-01 DIAGNOSIS — S7291XP Unspecified fracture of right femur, subsequent encounter for closed fracture with malunion: Secondary | ICD-10-CM | POA: Diagnosis not present

## 2020-11-01 DIAGNOSIS — N390 Urinary tract infection, site not specified: Secondary | ICD-10-CM | POA: Diagnosis not present

## 2020-11-01 DIAGNOSIS — R600 Localized edema: Secondary | ICD-10-CM | POA: Diagnosis not present

## 2020-11-01 DIAGNOSIS — Z96649 Presence of unspecified artificial hip joint: Secondary | ICD-10-CM

## 2020-11-01 NOTE — Progress Notes (Signed)
Post Operative Evaluation    Procedure/Date of Surgery: Right hip hemiarthroplasty 10/15/20  Interval History:   Henry Adams presents 2 weeks status post right hip hemiarthroplasty.  He presents today in a wheelchair sitting up.  He has been at a rehab facility for the last 2 weeks.  He has been predominantly in a chair although there is been some effort to mobilize up with walker.  He has been taking his anticoagulation.  Pain has been very well controlled.  He is here today with his daughter.   PMH/PSH/Family History/Social History/Meds/Allergies:    Past Medical History:  Diagnosis Date   Allergy    perennial   Anxiety    Arthritis    Cholelithiasis    Chronic idiopathic constipation    CKD (chronic kidney disease)    Depression    Diverticulosis    Enlarged prostate    Family history of adverse reaction to anesthesia    Gallstones    GERD (gastroesophageal reflux disease)    HLD (hyperlipidemia)    Hx of cardiovascular stress test    ETT-Myoview 6/14: probable low risk study, inf defect with normal inf wall motion-cannot r/o ischemia; prominent gut uptake adjacent to inf wall on rest images-poss defect is atten, EF 63%   Hypertension    Melanoma ([place])    Parkinsonism ([place])    Polycystic kidney disease    PONV (postoperative nausea and vomiting)    Past Surgical History:  Procedure Laterality Date   ANTERIOR APPROACH HEMI HIP ARTHROPLASTY Right 10/15/2020   Procedure: ANTERIOR APPROACH HIP ARTHROPLASTY;  Surgeon: Vanetta Mulders, MD;  Location: [place];  Service: Orthopedics;  Laterality: Right;   COLONOSCOPY  2011   diverticulosis   HERNIA REPAIR     umbilical   KNEE SURGERY Left    medialization laryngoplasty  10/15/2013   Dr Joya Gaskins, [facility]   TONSILLECTOMY AND ADENOIDECTOMY     Social History   Socioeconomic History   Marital status: Married    Spouse name: Not on file   Number of children: 2   Years of education: Not on file    Highest education level: Not on file  Occupational History   Occupation: retired    Comment: business; 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  Tobacco Use   Smoking status: Never   Smokeless tobacco: Never  Vaping Use   Vaping Use: Never used  Substance and Sexual Activity   Alcohol use: Not Currently    Alcohol/week: 0.0 standard drinks   Drug use: No   Sexual activity: Not on file  Other Topics Concern   Not on file  Social History Narrative   Not on file   Social Determinants of Health   Financial Resource Strain: Low Risk    Difficulty of Paying Living Expenses: Not hard at all  Food Insecurity: No Food Insecurity   Worried About Charity fundraiser in the Last Year: Never true   [place] in the Last Year: Never true  Transportation Needs: No Transportation Needs   Lack of Transportation (Medical): No   Lack of Transportation (Non-Medical): No  Physical Activity: Inactive   Days of Exercise per Week: 0 days   Minutes of Exercise per Session: 0 min  Stress: No Stress Concern Present   Feeling of Stress : Not at all  Social Connections: Socially Isolated   Frequency of Communication with Friends and Family: More than three times a week   Frequency of Social Gatherings with Friends and Family: More than three times a week   Attends Religious Services: Never   Marine scientist or Organizations: No   Attends Archivist Meetings: Never   Marital Status: Widowed   Family History  Problem Relation Age of Onset   Hypertension Father    Cancer Brother        mouth   Cancer Sister        breast   Heart disease Brother        heart transplant   Cancer Daughter        breast   Stroke Sister        >65   Skin cancer Brother        squamous cell   Colon cancer Neg Hx    Allergies  Allergen Reactions   Iodine     rash   Oxycodone     Mental status changes & nausea   Melatonin     Per daughter, pt does not tolerate - increased confusion   Pollen  Extract Itching   Tamiflu [Oseltamivir Phosphate] Other (See Comments)    hallucinations   Tamsulosin     Other reaction(s): Other (See Comments) Unknown    Doxazosin Rash    agitation   Etodolac Rash    unknown   Latex Rash    Daughter states patient has been told not to use latex band aids.   Tape Rash    Certain "Band-Aids"   Current Outpatient Medications  Medication Sig Dispense Refill   acetaminophen (TYLENOL) 500 MG tablet 500 mg prn for back, knee, shoulder or head pain. May be given up to every 6 hours. (Patient taking differently: Take 500 mg by mouth See admin instructions. 500 mg prn for back, knee, shoulder or head pain. May be given up to every 6 hours.) 90 tablet 3   alfuzosin (UROXATRAL) 10 MG 24 hr tablet Take 1 tablet (10 mg total) by mouth 2 (two) times daily. 10 mg at 7 am and 7 pm daily.     Carbidopa-Levodopa ER (SINEMET CR) 25-100 MG tablet controlled release TAKE ONE TABLET AT BEDTIME. (Patient taking differently: Take 1 tablet by mouth in the morning, at noon, in the evening, and at bedtime.) 90 tablet 0   Carbidopa-Levodopa ER (SINEMET CR) 25-100 MG tablet controlled release Take 1.5 tablets by mouth every evening.     carboxymethylcellulose (REFRESH PLUS) 0.5 % SOLN 1 drop 3 (three) times daily as needed (dry eyes lubricant).     clonazePAM (KLONOPIN) 0.5 MG tablet Take 1 tablet (0.5 mg total) by mouth at bedtime. 10 tablet 0   donepezil (ARICEPT) 10 MG tablet Take 1 tablet (10 mg total) by mouth at bedtime. 90 tablet 1   enoxaparin (LOVENOX) 40 MG/0.4ML injection Inject 0.4 mLs (40 mg total) into the skin daily. 12 mL 0   fexofenadine (ALLEGRA) 180 MG tablet Take 1 tablet (180 mg total) by mouth daily. 180mg  QD 90 tablet 3   finasteride (PROSCAR) 5 MG tablet Take 1 tablet (5 mg total) by mouth daily. 5mg  QD 90 tablet 1   FLUoxetine (PROZAC) 10 MG capsule Take 1 capsule (10 mg total) by mouth daily. 30 capsule 1   HYDROcodone-acetaminophen (NORCO/VICODIN) 5-325  MG tablet Take 1 tablet by mouth every 6 (six) hours as needed for moderate pain.  5 tablet 0   meclizine (ANTIVERT) 12.5 MG tablet Take 12.5 mg by mouth 3 (three) times daily as needed for dizziness.     memantine (NAMENDA) 10 MG tablet Take 10 mg by mouth 2 (two) times daily.     Menthol-Zinc Oxide (CALMOSEPTINE) 0.44-20.6 % OINT Apply 1 Act topically daily. (Patient not taking: Reported on 10/16/2020) 113 g 3   omeprazole (PRILOSEC) 40 MG capsule TAKE (1) CAPSULE DAILY. 90 capsule 0   Polyethylene Glycol 3350 (MIRALAX PO) Take 17 g by mouth See admin instructions. With 8 ounce of water and drink by mouth every day as needed for mild constipation.     Probiotic Product ([place]) CAPS Take 1 capsule by mouth daily. 90 capsule 1   vitamin B-12 (CYANOCOBALAMIN) 1000 MCG tablet Take 1 tablet (1,000 mcg total) by mouth daily. 90 tablet 3   No current facility-administered medications for this visit.   No results found.  Review of Systems:   A ROS was performed including pertinent positives and negatives as documented in the HPI.   Musculoskeletal Exam:    There were no vitals taken for this visit.  Right hip incision is clean dry intact.  Staples removed.  No erythema or drainage.  Sitting comfortably in chair.  He is able to stand with assistance.  Sensation is intact in all distributions distally.  Toes are warm and well-perfused.  Imaging:   X-ray AP pelvis and right hip 2 views: Status post right hip hemiarthroplasty without evidence of hardware failure  I personally reviewed and interpreted the radiographs.   Assessment:   80 year old male 2-week status post right hip hemiarthroplasty doing well.  I would like him to continue to mobilize as tolerated.  I have written him a note that says that he should not be sitting continuously for more than 3 hours.  I would like him to be up to a walker at least a minimum of once a day.  I did discuss that with deconditioning it is  important to have goals to work towards these.  His daughter understands this.  Plan :    -Follow-up in 4 weeks for reassessment    I personally saw and evaluated the patient, and participated in the management and treatment plan.  Vanetta Mulders, MD Attending Physician, Orthopedic Surgery  This document was dictated using Dragon voice recognition software. A reasonable attempt at proof reading has been made to minimize errors.

## 2020-11-02 ENCOUNTER — Encounter (HOSPITAL_BASED_OUTPATIENT_CLINIC_OR_DEPARTMENT_OTHER): Payer: Self-pay | Admitting: Orthopaedic Surgery

## 2020-11-02 DIAGNOSIS — N183 Chronic kidney disease, stage 3 unspecified: Secondary | ICD-10-CM | POA: Diagnosis not present

## 2020-11-02 DIAGNOSIS — I129 Hypertensive chronic kidney disease with stage 1 through stage 4 chronic kidney disease, or unspecified chronic kidney disease: Secondary | ICD-10-CM | POA: Diagnosis not present

## 2020-11-02 DIAGNOSIS — G20 Parkinson's disease: Secondary | ICD-10-CM | POA: Diagnosis not present

## 2020-11-03 ENCOUNTER — Ambulatory Visit: Payer: Medicare Other | Admitting: Psychiatry

## 2020-11-15 DIAGNOSIS — N1832 Chronic kidney disease, stage 3b: Secondary | ICD-10-CM | POA: Diagnosis not present

## 2020-11-15 DIAGNOSIS — L98419 Non-pressure chronic ulcer of buttock with unspecified severity: Secondary | ICD-10-CM | POA: Diagnosis not present

## 2020-11-16 DIAGNOSIS — S7291XA Unspecified fracture of right femur, initial encounter for closed fracture: Secondary | ICD-10-CM | POA: Diagnosis not present

## 2020-11-16 DIAGNOSIS — D649 Anemia, unspecified: Secondary | ICD-10-CM | POA: Diagnosis not present

## 2020-11-16 DIAGNOSIS — G20 Parkinson's disease: Secondary | ICD-10-CM | POA: Diagnosis not present

## 2020-11-18 DIAGNOSIS — F03918 Unspecified dementia, unspecified severity, with other behavioral disturbance: Secondary | ICD-10-CM | POA: Diagnosis not present

## 2020-11-22 DIAGNOSIS — L98419 Non-pressure chronic ulcer of buttock with unspecified severity: Secondary | ICD-10-CM | POA: Diagnosis not present

## 2020-11-29 DIAGNOSIS — R296 Repeated falls: Secondary | ICD-10-CM | POA: Diagnosis not present

## 2020-11-29 DIAGNOSIS — L98419 Non-pressure chronic ulcer of buttock with unspecified severity: Secondary | ICD-10-CM | POA: Diagnosis not present

## 2020-12-05 ENCOUNTER — Other Ambulatory Visit: Payer: Self-pay

## 2020-12-05 ENCOUNTER — Ambulatory Visit (HOSPITAL_BASED_OUTPATIENT_CLINIC_OR_DEPARTMENT_OTHER)
Admission: RE | Admit: 2020-12-05 | Discharge: 2020-12-05 | Disposition: A | Discharge status: Home / Self Care | Payer: Medicare Other | Source: Ambulatory Visit | Attending: Orthopaedic Surgery | Admitting: Orthopaedic Surgery

## 2020-12-05 ENCOUNTER — Ambulatory Visit (INDEPENDENT_AMBULATORY_CARE_PROVIDER_SITE_OTHER): Payer: Medicare Other | Admitting: Orthopaedic Surgery

## 2020-12-05 ENCOUNTER — Other Ambulatory Visit (HOSPITAL_BASED_OUTPATIENT_CLINIC_OR_DEPARTMENT_OTHER): Payer: Self-pay | Admitting: Orthopaedic Surgery

## 2020-12-05 ENCOUNTER — Encounter (HOSPITAL_BASED_OUTPATIENT_CLINIC_OR_DEPARTMENT_OTHER): Payer: Self-pay | Admitting: Radiology

## 2020-12-05 DIAGNOSIS — M1611 Unilateral primary osteoarthritis, right hip: Secondary | ICD-10-CM | POA: Diagnosis not present

## 2020-12-05 DIAGNOSIS — Z96649 Presence of unspecified artificial hip joint: Secondary | ICD-10-CM

## 2020-12-05 DIAGNOSIS — G20 Parkinson's disease: Secondary | ICD-10-CM | POA: Diagnosis not present

## 2020-12-05 DIAGNOSIS — S7291XP Unspecified fracture of right femur, subsequent encounter for closed fracture with malunion: Secondary | ICD-10-CM

## 2020-12-05 NOTE — Progress Notes (Signed)
Post Operative Evaluation    Procedure/Date of Surgery: Right hip hemiarthroplasty 10/15/20  Interval History:   Henry Adams presents status post the above procedure.  Unfortunately he has not been able to place any significant weight on this right side.  He has been mostly in a wheelchair at the nurses station.  He has now had physical therapy discontinued by insurance.  He does still have some pain.  His daughter reports that he has had a few falls out of bed.   PMH/PSH/Family History/Social History/Meds/Allergies:    Past Medical History:  Diagnosis Date   Allergy    perennial   Anxiety    Arthritis    Cholelithiasis    Chronic idiopathic constipation    CKD (chronic kidney disease)    Depression    Diverticulosis    Enlarged prostate    Family history of adverse reaction to anesthesia    Gallstones    GERD (gastroesophageal reflux disease)    HLD (hyperlipidemia)    Hx of cardiovascular stress test    ETT-Myoview 6/14: probable low risk study, inf defect with normal inf wall motion-cannot r/o ischemia; prominent gut uptake adjacent to inf wall on rest images-poss defect is atten, EF 63%   Hypertension    Melanoma ([place])    Parkinsonism ([place])    Polycystic kidney disease    PONV (postoperative nausea and vomiting)    Past Surgical History:  Procedure Laterality Date   ANTERIOR APPROACH HEMI HIP ARTHROPLASTY Right 10/15/2020   Procedure: ANTERIOR APPROACH HIP ARTHROPLASTY;  Surgeon: Vanetta Mulders, MD;  Location: [place];  Service: Orthopedics;  Laterality: Right;   COLONOSCOPY  2011   diverticulosis   HERNIA REPAIR     umbilical   KNEE SURGERY Left    medialization laryngoplasty  10/15/2013   Dr Joya Gaskins, [name]   TONSILLECTOMY AND ADENOIDECTOMY     Social History   Socioeconomic History   Marital status: Married    Spouse name: Not on file   Number of children: 2   Years of education: Not on file   Highest education level: Not on file   Occupational History   Occupation: retired    Comment: business; 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  Tobacco Use   Smoking status: Never   Smokeless tobacco: Never  Vaping Use   Vaping Use: Never used  Substance and Sexual Activity   Alcohol use: Not Currently    Alcohol/week: 0.0 standard drinks   Drug use: No   Sexual activity: Not on file  Other Topics Concern   Not on file  Social History Narrative   Not on file   Social Determinants of Health   Financial Resource Strain: Low Risk    Difficulty of Paying Living Expenses: Not hard at all  Food Insecurity: No Food Insecurity   Worried About Charity fundraiser in the Last Year: Never true   [place] in the Last Year: Never true  Transportation Needs: No Transportation Needs   Lack of Transportation (Medical): No   Lack of Transportation (Non-Medical): No  Physical Activity: Inactive   Days of Exercise per Week: 0 days   Minutes of Exercise per Session: 0 min  Stress: No Stress Concern Present   Feeling of Stress : Not at all  Social Connections: Socially Isolated  Frequency of Communication with Friends and Family: More than three times a week   Frequency of Social Gatherings with Friends and Family: More than three times a week   Attends Religious Services: Never   Marine scientist or Organizations: No   Attends Archivist Meetings: Never   Marital Status: Widowed   Family History  Problem Relation Age of Onset   Hypertension Father    Cancer Brother        mouth   Cancer Sister        breast   Heart disease Brother        heart transplant   Cancer Daughter        breast   Stroke Sister        >65   Skin cancer Brother        squamous cell   Colon cancer Neg Hx    Allergies  Allergen Reactions   Iodine     rash   Oxycodone     Mental status changes & nausea   Melatonin     Per daughter, pt does not tolerate - increased confusion   Pollen Extract Itching   Tamiflu [Oseltamivir  Phosphate] Other (See Comments)    hallucinations   Tamsulosin     Other reaction(s): Other (See Comments) Unknown    Doxazosin Rash    agitation   Etodolac Rash    unknown   Latex Rash    Daughter states patient has been told not to use latex band aids.   Tape Rash    Certain "Band-Aids"   Current Outpatient Medications  Medication Sig Dispense Refill   acetaminophen (TYLENOL) 500 MG tablet 500 mg prn for back, knee, shoulder or head pain. May be given up to every 6 hours. (Patient taking differently: Take 500 mg by mouth See admin instructions. 500 mg prn for back, knee, shoulder or head pain. May be given up to every 6 hours.) 90 tablet 3   alfuzosin (UROXATRAL) 10 MG 24 hr tablet Take 1 tablet (10 mg total) by mouth 2 (two) times daily. 10 mg at 7 am and 7 pm daily.     Carbidopa-Levodopa ER (SINEMET CR) 25-100 MG tablet controlled release TAKE ONE TABLET AT BEDTIME. (Patient taking differently: Take 1 tablet by mouth in the morning, at noon, in the evening, and at bedtime.) 90 tablet 0   Carbidopa-Levodopa ER (SINEMET CR) 25-100 MG tablet controlled release Take 1.5 tablets by mouth every evening.     carboxymethylcellulose (REFRESH PLUS) 0.5 % SOLN 1 drop 3 (three) times daily as needed (dry eyes lubricant).     clonazePAM (KLONOPIN) 0.5 MG tablet Take 1 tablet (0.5 mg total) by mouth at bedtime. 10 tablet 0   donepezil (ARICEPT) 10 MG tablet Take 1 tablet (10 mg total) by mouth at bedtime. 90 tablet 1   enoxaparin (LOVENOX) 40 MG/0.4ML injection Inject 0.4 mLs (40 mg total) into the skin daily. 12 mL 0   fexofenadine (ALLEGRA) 180 MG tablet Take 1 tablet (180 mg total) by mouth daily. 180mg  QD 90 tablet 3   finasteride (PROSCAR) 5 MG tablet Take 1 tablet (5 mg total) by mouth daily. 5mg  QD 90 tablet 1   FLUoxetine (PROZAC) 10 MG capsule Take 1 capsule (10 mg total) by mouth daily. 30 capsule 1   HYDROcodone-acetaminophen (NORCO/VICODIN) 5-325 MG tablet Take 1 tablet by mouth every 6  (six) hours as needed for moderate pain. 5 tablet 0   meclizine (  ANTIVERT) 12.5 MG tablet Take 12.5 mg by mouth 3 (three) times daily as needed for dizziness.     memantine (NAMENDA) 10 MG tablet Take 10 mg by mouth 2 (two) times daily.     Menthol-Zinc Oxide (CALMOSEPTINE) 0.44-20.6 % OINT Apply 1 Act topically daily. (Patient not taking: Reported on 10/16/2020) 113 g 3   omeprazole (PRILOSEC) 40 MG capsule TAKE (1) CAPSULE DAILY. 90 capsule 0   Polyethylene Glycol 3350 (MIRALAX PO) Take 17 g by mouth See admin instructions. With 8 ounce of water and drink by mouth every day as needed for mild constipation.     Probiotic Product ([place]) CAPS Take 1 capsule by mouth daily. 90 capsule 1   vitamin B-12 (CYANOCOBALAMIN) 1000 MCG tablet Take 1 tablet (1,000 mcg total) by mouth daily. 90 tablet 3   No current facility-administered medications for this visit.   No results found.  Review of Systems:   A ROS was performed including pertinent positives and negatives as documented in the HPI.   Musculoskeletal Exam:    There were no vitals taken for this visit.  Sitting comfortably in chair.  No pain with 20 degrees of internal and external rotation.  He is noticeably cachectic.  Imaging:   X-ray AP pelvis and right hip 2 views: Status post right hip hemiarthroplasty without evidence of hardware failure  I personally reviewed and interpreted the radiographs.   Assessment:   80 year old male status post hip hemiarthroplasty.  At this time unfortunately he does appear to be frail without the ability to walk even with a walker.  I have advised with him and his daughter today that I do believe it is unlikely that he will regain the capacity to walk given the loss of muscle mass.  That being said I would like him to continue to help him mobilize to the wheelchair so that he may use his upper body against gravity.  He may take Tylenol as needed at this point.  He should discontinue  Lovenox.  Plan :    -He will follow-up as needed at this time    I personally saw and evaluated the patient, and participated in the management and treatment plan.  Vanetta Mulders, MD Attending Physician, Orthopedic Surgery  This document was dictated using Dragon voice recognition software. A reasonable attempt at proof reading has been made to minimize errors.

## 2020-12-06 DIAGNOSIS — L98419 Non-pressure chronic ulcer of buttock with unspecified severity: Secondary | ICD-10-CM | POA: Diagnosis not present

## 2020-12-13 DIAGNOSIS — N1832 Chronic kidney disease, stage 3b: Secondary | ICD-10-CM | POA: Diagnosis not present

## 2020-12-19 ENCOUNTER — Ambulatory Visit: Payer: Medicare Other | Admitting: Neurology

## 2020-12-22 DEATH — deceased

## 2021-01-05 ENCOUNTER — Ambulatory Visit: Payer: Medicare Other | Admitting: Neurology

## 2021-01-10 ENCOUNTER — Ambulatory Visit: Payer: Medicare Other | Admitting: Psychiatry

## 2021-01-22 DEATH — deceased
